# Patient Record
Sex: Female | Born: 1940 | Race: White | Hispanic: No | Smoking: Former smoker
Health system: Southern US, Community
[De-identification: ages and names within clinical notes are randomized; demographics above are authoritative.]

## PROBLEM LIST (undated history)

## (undated) DIAGNOSIS — C4491 Basal cell carcinoma of skin, unspecified: Secondary | ICD-10-CM

## (undated) DIAGNOSIS — M1990 Unspecified osteoarthritis, unspecified site: Secondary | ICD-10-CM

## (undated) DIAGNOSIS — K5090 Crohn's disease, unspecified, without complications: Secondary | ICD-10-CM

## (undated) DIAGNOSIS — D649 Anemia, unspecified: Secondary | ICD-10-CM

## (undated) HISTORY — PX: BREAST EXCISIONAL BIOPSY: SUR124

## (undated) HISTORY — DX: Crohn's disease, unspecified, without complications: K50.90

## (undated) HISTORY — DX: Basal cell carcinoma of skin, unspecified: C44.91

## (undated) HISTORY — DX: Anemia, unspecified: D64.9

## (undated) HISTORY — DX: Unspecified osteoarthritis, unspecified site: M19.90

---

## 1982-06-06 HISTORY — PX: TOTAL COLECTOMY: SHX852

## 1992-06-06 HISTORY — PX: OTHER SURGICAL HISTORY: SHX169

## 1999-11-26 ENCOUNTER — Other Ambulatory Visit: Admission: RE | Admit: 1999-11-26 | Discharge: 1999-11-26 | Payer: Self-pay | Admitting: Internal Medicine

## 1999-12-02 ENCOUNTER — Encounter: Payer: Self-pay | Admitting: Internal Medicine

## 1999-12-02 ENCOUNTER — Encounter: Admission: RE | Admit: 1999-12-02 | Discharge: 1999-12-02 | Payer: Self-pay | Admitting: Internal Medicine

## 2000-12-15 ENCOUNTER — Encounter: Payer: Self-pay | Admitting: Internal Medicine

## 2000-12-15 ENCOUNTER — Encounter: Admission: RE | Admit: 2000-12-15 | Discharge: 2000-12-15 | Payer: Self-pay | Admitting: Internal Medicine

## 2002-01-04 ENCOUNTER — Encounter: Admission: RE | Admit: 2002-01-04 | Discharge: 2002-01-04 | Payer: Self-pay | Admitting: Internal Medicine

## 2002-01-04 ENCOUNTER — Encounter: Payer: Self-pay | Admitting: Internal Medicine

## 2003-01-30 ENCOUNTER — Encounter: Payer: Self-pay | Admitting: Emergency Medicine

## 2003-01-30 ENCOUNTER — Encounter: Admission: RE | Admit: 2003-01-30 | Discharge: 2003-01-30 | Payer: Self-pay | Admitting: Emergency Medicine

## 2004-01-16 ENCOUNTER — Other Ambulatory Visit: Admission: RE | Admit: 2004-01-16 | Discharge: 2004-01-16 | Payer: Self-pay | Admitting: Family Medicine

## 2004-02-13 ENCOUNTER — Encounter: Admission: RE | Admit: 2004-02-13 | Discharge: 2004-02-13 | Payer: Self-pay | Admitting: Family Medicine

## 2004-08-02 ENCOUNTER — Encounter: Admission: RE | Admit: 2004-08-02 | Discharge: 2004-08-02 | Payer: Self-pay | Admitting: Plastic Surgery

## 2005-03-09 ENCOUNTER — Other Ambulatory Visit: Admission: RE | Admit: 2005-03-09 | Discharge: 2005-03-09 | Payer: Self-pay | Admitting: Family Medicine

## 2005-04-01 ENCOUNTER — Encounter: Admission: RE | Admit: 2005-04-01 | Discharge: 2005-04-01 | Payer: Self-pay | Admitting: Family Medicine

## 2005-11-08 ENCOUNTER — Ambulatory Visit: Payer: Self-pay | Admitting: Family Medicine

## 2005-12-08 ENCOUNTER — Ambulatory Visit: Payer: Self-pay | Admitting: Family Medicine

## 2006-01-09 ENCOUNTER — Ambulatory Visit: Payer: Self-pay | Admitting: Family Medicine

## 2006-02-02 ENCOUNTER — Ambulatory Visit: Payer: Self-pay | Admitting: Family Medicine

## 2006-03-14 ENCOUNTER — Ambulatory Visit: Payer: Self-pay | Admitting: Family Medicine

## 2006-03-14 ENCOUNTER — Other Ambulatory Visit: Admission: RE | Admit: 2006-03-14 | Discharge: 2006-03-14 | Payer: Self-pay | Admitting: Family Medicine

## 2006-03-27 ENCOUNTER — Ambulatory Visit: Payer: Self-pay | Admitting: Internal Medicine

## 2006-03-28 ENCOUNTER — Ambulatory Visit: Payer: Self-pay | Admitting: Family Medicine

## 2006-04-04 ENCOUNTER — Encounter: Admission: RE | Admit: 2006-04-04 | Discharge: 2006-04-04 | Payer: Self-pay | Admitting: Family Medicine

## 2006-04-10 ENCOUNTER — Ambulatory Visit: Payer: Self-pay | Admitting: Family Medicine

## 2006-05-08 ENCOUNTER — Ambulatory Visit: Payer: Self-pay | Admitting: Family Medicine

## 2006-05-25 ENCOUNTER — Ambulatory Visit: Payer: Self-pay | Admitting: Internal Medicine

## 2006-06-01 LAB — IRON AND TIBC
%SAT: 22 % (ref 20–55)
Iron: 52 ug/dL (ref 42–145)
TIBC: 237 ug/dL — ABNORMAL LOW (ref 250–470)

## 2006-06-01 LAB — CBC WITH DIFFERENTIAL/PLATELET
BASO%: 0.7 % (ref 0.0–2.0)
MCHC: 32.1 g/dL (ref 32.0–36.0)
MONO#: 0.2 10*3/uL (ref 0.1–0.9)
RBC: 3.55 10*6/uL — ABNORMAL LOW (ref 3.70–5.32)
RDW: 24.9 % — ABNORMAL HIGH (ref 11.3–14.5)
WBC: 3.2 10*3/uL — ABNORMAL LOW (ref 3.9–10.0)
lymph#: 0.8 10*3/uL — ABNORMAL LOW (ref 0.9–3.3)

## 2006-06-12 ENCOUNTER — Ambulatory Visit: Payer: Self-pay | Admitting: Family Medicine

## 2006-07-11 ENCOUNTER — Ambulatory Visit: Payer: Self-pay | Admitting: Family Medicine

## 2006-08-07 ENCOUNTER — Ambulatory Visit: Payer: Self-pay | Admitting: Family Medicine

## 2006-08-24 ENCOUNTER — Ambulatory Visit: Payer: Self-pay | Admitting: Internal Medicine

## 2006-08-29 LAB — CBC WITH DIFFERENTIAL/PLATELET
Basophils Absolute: 0 10*3/uL (ref 0.0–0.1)
EOS%: 2.1 % (ref 0.0–7.0)
LYMPH%: 28.1 % (ref 14.0–48.0)
MCH: 31.1 pg (ref 26.0–34.0)
MCV: 91.5 fL (ref 81.0–101.0)
MONO%: 8.1 % (ref 0.0–13.0)
Platelets: 272 10*3/uL (ref 145–400)
RBC: 4.08 10*6/uL (ref 3.70–5.32)
RDW: 16.3 % — ABNORMAL HIGH (ref 11.3–14.5)

## 2006-08-29 LAB — IRON AND TIBC
%SAT: 31 % (ref 20–55)
Iron: 82 ug/dL (ref 42–145)
UIBC: 185 ug/dL

## 2006-08-29 LAB — FERRITIN: Ferritin: 79 ng/mL (ref 10–291)

## 2006-09-05 ENCOUNTER — Ambulatory Visit: Payer: Self-pay | Admitting: Family Medicine

## 2006-11-09 ENCOUNTER — Ambulatory Visit: Payer: Self-pay | Admitting: Family Medicine

## 2006-11-23 ENCOUNTER — Ambulatory Visit: Payer: Self-pay | Admitting: Internal Medicine

## 2006-11-27 LAB — IRON AND TIBC
TIBC: 243 ug/dL — ABNORMAL LOW (ref 250–470)
UIBC: 189 ug/dL

## 2006-11-27 LAB — CBC WITH DIFFERENTIAL/PLATELET
BASO%: 0.5 % (ref 0.0–2.0)
EOS%: 1.1 % (ref 0.0–7.0)
MCH: 30.6 pg (ref 26.0–34.0)
MCHC: 34 g/dL (ref 32.0–36.0)
RDW: 15 % — ABNORMAL HIGH (ref 11.3–14.5)
lymph#: 0.8 10*3/uL — ABNORMAL LOW (ref 0.9–3.3)

## 2006-12-12 ENCOUNTER — Ambulatory Visit: Payer: Self-pay | Admitting: Family Medicine

## 2007-01-18 ENCOUNTER — Ambulatory Visit: Payer: Self-pay | Admitting: Family Medicine

## 2007-01-28 ENCOUNTER — Ambulatory Visit: Payer: Self-pay | Admitting: Internal Medicine

## 2007-01-29 LAB — CBC WITH DIFFERENTIAL/PLATELET
BASO%: 0.3 % (ref 0.0–2.0)
Eosinophils Absolute: 0 10*3/uL (ref 0.0–0.5)
LYMPH%: 28.3 % (ref 14.0–48.0)
MCHC: 35.1 g/dL (ref 32.0–36.0)
MCV: 88.7 fL (ref 81.0–101.0)
MONO%: 15.7 % — ABNORMAL HIGH (ref 0.0–13.0)
Platelets: 267 10*3/uL (ref 145–400)
RBC: 3.39 10*6/uL — ABNORMAL LOW (ref 3.70–5.32)

## 2007-01-29 LAB — IRON AND TIBC: UIBC: 185 ug/dL

## 2007-01-29 LAB — FERRITIN: Ferritin: 54 ng/mL (ref 10–291)

## 2007-02-16 ENCOUNTER — Ambulatory Visit: Payer: Self-pay | Admitting: Family Medicine

## 2007-03-14 ENCOUNTER — Ambulatory Visit: Payer: Self-pay | Admitting: Family Medicine

## 2007-04-05 ENCOUNTER — Ambulatory Visit: Payer: Self-pay | Admitting: Family Medicine

## 2007-04-05 ENCOUNTER — Other Ambulatory Visit: Admission: RE | Admit: 2007-04-05 | Discharge: 2007-04-05 | Payer: Self-pay | Admitting: Family Medicine

## 2007-04-16 ENCOUNTER — Encounter: Admission: RE | Admit: 2007-04-16 | Discharge: 2007-04-16 | Payer: Self-pay | Admitting: Family Medicine

## 2007-04-18 ENCOUNTER — Ambulatory Visit: Payer: Self-pay | Admitting: Family Medicine

## 2007-04-26 ENCOUNTER — Ambulatory Visit: Payer: Self-pay | Admitting: Internal Medicine

## 2007-04-30 LAB — CBC WITH DIFFERENTIAL/PLATELET
BASO%: 0 % (ref 0.0–2.0)
Eosinophils Absolute: 0 10*3/uL (ref 0.0–0.5)
HCT: 34.4 % — ABNORMAL LOW (ref 34.8–46.6)
MCHC: 34.3 g/dL (ref 32.0–36.0)
MONO#: 0.4 10*3/uL (ref 0.1–0.9)
NEUT#: 1.6 10*3/uL (ref 1.5–6.5)
NEUT%: 64.3 % (ref 39.6–76.8)
WBC: 2.5 10*3/uL — ABNORMAL LOW (ref 3.9–10.0)
lymph#: 0.5 10*3/uL — ABNORMAL LOW (ref 0.9–3.3)

## 2007-05-01 LAB — IRON AND TIBC
%SAT: 21 % (ref 20–55)
Iron: 48 ug/dL (ref 42–145)
TIBC: 228 ug/dL — ABNORMAL LOW (ref 250–470)

## 2007-05-01 LAB — VITAMIN B12: Vitamin B-12: 980 pg/mL — ABNORMAL HIGH (ref 211–911)

## 2007-05-01 LAB — FOLATE RBC: RBC Folate: 1917 ng/mL — ABNORMAL HIGH (ref 180–600)

## 2007-05-01 LAB — FERRITIN: Ferritin: 109 ng/mL (ref 10–291)

## 2007-05-09 ENCOUNTER — Ambulatory Visit: Payer: Self-pay | Admitting: Family Medicine

## 2007-06-07 HISTORY — PX: OTHER SURGICAL HISTORY: SHX169

## 2007-06-18 ENCOUNTER — Ambulatory Visit: Payer: Self-pay | Admitting: Family Medicine

## 2007-07-10 ENCOUNTER — Emergency Department (HOSPITAL_COMMUNITY): Admission: EM | Admit: 2007-07-10 | Discharge: 2007-07-10 | Payer: Self-pay | Admitting: Emergency Medicine

## 2007-07-13 ENCOUNTER — Ambulatory Visit (HOSPITAL_COMMUNITY): Admission: RE | Admit: 2007-07-13 | Discharge: 2007-07-13 | Payer: Self-pay | Admitting: Gastroenterology

## 2007-07-13 ENCOUNTER — Encounter (INDEPENDENT_AMBULATORY_CARE_PROVIDER_SITE_OTHER): Payer: Self-pay | Admitting: Gastroenterology

## 2007-07-13 LAB — HM COLONOSCOPY: HM Colonoscopy: NEGATIVE

## 2007-08-02 ENCOUNTER — Ambulatory Visit: Payer: Self-pay | Admitting: Internal Medicine

## 2007-08-06 LAB — CBC WITH DIFFERENTIAL/PLATELET
Basophils Absolute: 0 10*3/uL (ref 0.0–0.1)
Eosinophils Absolute: 0 10*3/uL (ref 0.0–0.5)
HCT: 24.3 % — ABNORMAL LOW (ref 34.8–46.6)
HGB: 7.8 g/dL — ABNORMAL LOW (ref 11.6–15.9)
LYMPH%: 9.6 % — ABNORMAL LOW (ref 14.0–48.0)
MCV: 77.6 fL — ABNORMAL LOW (ref 81.0–101.0)
MONO%: 8.1 % (ref 0.0–13.0)
NEUT#: 3.9 10*3/uL (ref 1.5–6.5)
NEUT%: 82 % — ABNORMAL HIGH (ref 39.6–76.8)
Platelets: 449 10*3/uL — ABNORMAL HIGH (ref 145–400)

## 2007-08-06 LAB — FERRITIN: Ferritin: 59 ng/mL (ref 10–291)

## 2007-09-03 ENCOUNTER — Ambulatory Visit: Payer: Self-pay | Admitting: Family Medicine

## 2007-09-18 ENCOUNTER — Encounter: Admission: RE | Admit: 2007-09-18 | Discharge: 2007-09-18 | Payer: Self-pay | Admitting: Gastroenterology

## 2007-09-20 ENCOUNTER — Inpatient Hospital Stay (HOSPITAL_COMMUNITY): Admission: AD | Admit: 2007-09-20 | Discharge: 2007-09-22 | Payer: Self-pay | Admitting: Gastroenterology

## 2007-10-05 ENCOUNTER — Ambulatory Visit: Payer: Self-pay | Admitting: Internal Medicine

## 2007-10-10 ENCOUNTER — Ambulatory Visit: Payer: Self-pay | Admitting: Family Medicine

## 2007-10-10 LAB — CBC WITH DIFFERENTIAL/PLATELET
Basophils Absolute: 0 10*3/uL (ref 0.0–0.1)
Eosinophils Absolute: 0 10*3/uL (ref 0.0–0.5)
HCT: 34.7 % — ABNORMAL LOW (ref 34.8–46.6)
LYMPH%: 28.5 % (ref 14.0–48.0)
MCV: 86.5 fL (ref 81.0–101.0)
MONO#: 0.6 10*3/uL (ref 0.1–0.9)
NEUT#: 1.8 10*3/uL (ref 1.5–6.5)
NEUT%: 51 % (ref 39.6–76.8)
Platelets: 244 10*3/uL (ref 145–400)
WBC: 3.4 10*3/uL — ABNORMAL LOW (ref 3.9–10.0)

## 2007-10-10 LAB — IRON AND TIBC
%SAT: 14 % — ABNORMAL LOW (ref 20–55)
TIBC: 230 ug/dL — ABNORMAL LOW (ref 250–470)

## 2007-10-10 LAB — TECHNOLOGIST REVIEW

## 2007-11-01 LAB — CBC WITH DIFFERENTIAL/PLATELET
Basophils Absolute: 0 10*3/uL (ref 0.0–0.1)
EOS%: 0.4 % (ref 0.0–7.0)
HCT: 29.1 % — ABNORMAL LOW (ref 34.8–46.6)
HGB: 9.7 g/dL — ABNORMAL LOW (ref 11.6–15.9)
MCH: 27.6 pg (ref 26.0–34.0)
MONO#: 0.5 10*3/uL (ref 0.1–0.9)
NEUT%: 79.4 % — ABNORMAL HIGH (ref 39.6–76.8)
lymph#: 0.6 10*3/uL — ABNORMAL LOW (ref 0.9–3.3)

## 2007-11-01 LAB — HOLD TUBE, BLOOD BANK

## 2007-11-06 ENCOUNTER — Inpatient Hospital Stay (HOSPITAL_COMMUNITY): Admission: EM | Admit: 2007-11-06 | Discharge: 2007-11-23 | Payer: Self-pay | Admitting: Emergency Medicine

## 2007-11-14 ENCOUNTER — Ambulatory Visit: Payer: Self-pay | Admitting: Gastroenterology

## 2007-11-20 ENCOUNTER — Ambulatory Visit: Payer: Self-pay | Admitting: Infectious Diseases

## 2007-11-30 ENCOUNTER — Ambulatory Visit: Payer: Self-pay | Admitting: Family Medicine

## 2007-12-11 ENCOUNTER — Ambulatory Visit: Payer: Self-pay | Admitting: Internal Medicine

## 2007-12-14 LAB — COMPREHENSIVE METABOLIC PANEL
Albumin: 3 g/dL — ABNORMAL LOW (ref 3.5–5.2)
Alkaline Phosphatase: 48 U/L (ref 39–117)
BUN: 37 mg/dL — ABNORMAL HIGH (ref 6–23)
CO2: 26 mEq/L (ref 19–32)
Calcium: 8.9 mg/dL (ref 8.4–10.5)
Chloride: 107 mEq/L (ref 96–112)
Glucose, Bld: 91 mg/dL (ref 70–99)
Potassium: 4 mEq/L (ref 3.5–5.3)

## 2007-12-14 LAB — CBC WITH DIFFERENTIAL/PLATELET
Basophils Absolute: 0 10*3/uL (ref 0.0–0.1)
Eosinophils Absolute: 0.2 10*3/uL (ref 0.0–0.5)
HGB: 9.5 g/dL — ABNORMAL LOW (ref 11.6–15.9)
MCV: 89.6 fL (ref 81.0–101.0)
MONO#: 0.6 10*3/uL (ref 0.1–0.9)
MONO%: 16.6 % — ABNORMAL HIGH (ref 0.0–13.0)
NEUT#: 2 10*3/uL (ref 1.5–6.5)
RDW: 18.1 % — ABNORMAL HIGH (ref 11.3–14.5)

## 2007-12-14 LAB — IRON AND TIBC
Iron: 87 ug/dL (ref 42–145)
TIBC: 216 ug/dL — ABNORMAL LOW (ref 250–470)
UIBC: 129 ug/dL

## 2008-01-08 ENCOUNTER — Ambulatory Visit: Payer: Self-pay | Admitting: Family Medicine

## 2008-02-04 ENCOUNTER — Ambulatory Visit: Payer: Self-pay | Admitting: Family Medicine

## 2008-02-07 ENCOUNTER — Ambulatory Visit: Payer: Self-pay | Admitting: Internal Medicine

## 2008-02-13 LAB — CBC WITH DIFFERENTIAL/PLATELET
BASO%: 0.4 % (ref 0.0–2.0)
EOS%: 1.3 % (ref 0.0–7.0)
HCT: 28.1 % — ABNORMAL LOW (ref 34.8–46.6)
HGB: 9.5 g/dL — ABNORMAL LOW (ref 11.6–15.9)
MCHC: 33.9 g/dL (ref 32.0–36.0)
MONO#: 0.5 10*3/uL (ref 0.1–0.9)
NEUT%: 61.6 % (ref 39.6–76.8)
RDW: 16.4 % — ABNORMAL HIGH (ref 11.3–14.5)
WBC: 3.1 10*3/uL — ABNORMAL LOW (ref 3.9–10.0)
lymph#: 0.7 10*3/uL — ABNORMAL LOW (ref 0.9–3.3)

## 2008-02-13 LAB — FERRITIN: Ferritin: 130 ng/mL (ref 10–291)

## 2008-02-13 LAB — IRON AND TIBC: Iron: 48 ug/dL (ref 42–145)

## 2008-02-21 ENCOUNTER — Ambulatory Visit: Payer: Self-pay | Admitting: Family Medicine

## 2008-03-11 ENCOUNTER — Ambulatory Visit: Payer: Self-pay | Admitting: Family Medicine

## 2008-04-07 ENCOUNTER — Other Ambulatory Visit: Admission: RE | Admit: 2008-04-07 | Discharge: 2008-04-07 | Payer: Self-pay | Admitting: Family Medicine

## 2008-04-07 ENCOUNTER — Ambulatory Visit: Payer: Self-pay | Admitting: Family Medicine

## 2008-04-10 ENCOUNTER — Ambulatory Visit: Payer: Self-pay | Admitting: Internal Medicine

## 2008-04-14 LAB — CBC WITH DIFFERENTIAL/PLATELET
Basophils Absolute: 0 10*3/uL (ref 0.0–0.1)
Eosinophils Absolute: 0.1 10*3/uL (ref 0.0–0.5)
HGB: 9.5 g/dL — ABNORMAL LOW (ref 11.6–15.9)
MONO#: 0.5 10*3/uL (ref 0.1–0.9)
NEUT#: 2.1 10*3/uL (ref 1.5–6.5)
Platelets: 268 10*3/uL (ref 145–400)
RBC: 3.55 10*6/uL — ABNORMAL LOW (ref 3.70–5.32)
RDW: 16.4 % — ABNORMAL HIGH (ref 11.3–14.5)
WBC: 3.4 10*3/uL — ABNORMAL LOW (ref 3.9–10.0)

## 2008-04-15 LAB — FOLATE RBC: RBC Folate: 2187 ng/mL — ABNORMAL HIGH (ref 180–600)

## 2008-04-15 LAB — VITAMIN B12: Vitamin B-12: 693 pg/mL (ref 211–911)

## 2008-04-15 LAB — LACTATE DEHYDROGENASE: LDH: 160 U/L (ref 94–250)

## 2008-04-15 LAB — IRON AND TIBC: %SAT: 14 % — ABNORMAL LOW (ref 20–55)

## 2008-04-21 ENCOUNTER — Encounter: Admission: RE | Admit: 2008-04-21 | Discharge: 2008-04-21 | Payer: Self-pay | Admitting: Family Medicine

## 2008-07-10 ENCOUNTER — Ambulatory Visit: Payer: Self-pay | Admitting: Internal Medicine

## 2008-07-14 LAB — CBC WITH DIFFERENTIAL/PLATELET
EOS%: 0.8 % (ref 0.0–7.0)
Eosinophils Absolute: 0 10*3/uL (ref 0.0–0.5)
LYMPH%: 12.6 % — ABNORMAL LOW (ref 14.0–48.0)
MCH: 26.8 pg (ref 26.0–34.0)
MCV: 81.4 fL (ref 81.0–101.0)
MONO%: 9.3 % (ref 0.0–13.0)
NEUT#: 3.9 10*3/uL (ref 1.5–6.5)
Platelets: 245 10*3/uL (ref 145–400)
RBC: 3.4 10*6/uL — ABNORMAL LOW (ref 3.70–5.32)
RDW: 18.2 % — ABNORMAL HIGH (ref 11.3–14.5)

## 2008-07-15 LAB — IRON AND TIBC: Iron: 36 ug/dL — ABNORMAL LOW (ref 42–145)

## 2008-07-15 LAB — FERRITIN: Ferritin: 33 ng/mL (ref 10–291)

## 2008-07-15 LAB — FOLATE RBC: RBC Folate: 1691 ng/mL — ABNORMAL HIGH (ref 180–600)

## 2008-08-26 ENCOUNTER — Ambulatory Visit: Payer: Self-pay | Admitting: Family Medicine

## 2008-09-11 ENCOUNTER — Ambulatory Visit: Payer: Self-pay | Admitting: Internal Medicine

## 2008-09-15 LAB — LACTATE DEHYDROGENASE: LDH: 151 U/L (ref 94–250)

## 2008-09-15 LAB — IRON AND TIBC
%SAT: 12 % — ABNORMAL LOW (ref 20–55)
Iron: 36 ug/dL — ABNORMAL LOW (ref 42–145)
TIBC: 300 ug/dL (ref 250–470)
UIBC: 264 ug/dL

## 2008-09-15 LAB — CBC WITH DIFFERENTIAL/PLATELET
Basophils Absolute: 0 10*3/uL (ref 0.0–0.1)
Eosinophils Absolute: 0.1 10*3/uL (ref 0.0–0.5)
HGB: 9.5 g/dL — ABNORMAL LOW (ref 11.6–15.9)
LYMPH%: 25.3 % (ref 14.0–49.7)
MCV: 82.3 fL (ref 79.5–101.0)
MONO%: 15 % — ABNORMAL HIGH (ref 0.0–14.0)
NEUT#: 1.9 10*3/uL (ref 1.5–6.5)
NEUT%: 55.8 % (ref 38.4–76.8)
Platelets: 231 10*3/uL (ref 145–400)

## 2008-09-15 LAB — VITAMIN B12: Vitamin B-12: 414 pg/mL (ref 211–911)

## 2008-09-15 LAB — FERRITIN: Ferritin: 19 ng/mL (ref 10–291)

## 2008-09-16 ENCOUNTER — Ambulatory Visit: Payer: Self-pay | Admitting: Family Medicine

## 2008-10-17 ENCOUNTER — Ambulatory Visit: Payer: Self-pay | Admitting: Family Medicine

## 2008-11-14 ENCOUNTER — Ambulatory Visit: Payer: Self-pay | Admitting: Family Medicine

## 2008-12-16 ENCOUNTER — Ambulatory Visit: Payer: Self-pay | Admitting: Internal Medicine

## 2009-01-05 LAB — CBC WITH DIFFERENTIAL/PLATELET
EOS%: 2 % (ref 0.0–7.0)
Eosinophils Absolute: 0.1 10*3/uL (ref 0.0–0.5)
MCV: 85.4 fL (ref 79.5–101.0)
MONO%: 11 % (ref 0.0–14.0)
NEUT#: 2.9 10*3/uL (ref 1.5–6.5)
RBC: 3.26 10*6/uL — ABNORMAL LOW (ref 3.70–5.45)
RDW: 15 % — ABNORMAL HIGH (ref 11.2–14.5)

## 2009-01-05 LAB — IRON AND TIBC
Iron: 29 ug/dL — ABNORMAL LOW (ref 42–145)
UIBC: 256 ug/dL

## 2009-01-05 LAB — FERRITIN: Ferritin: 14 ng/mL (ref 10–291)

## 2009-01-06 ENCOUNTER — Ambulatory Visit: Payer: Self-pay | Admitting: Family Medicine

## 2009-01-23 ENCOUNTER — Ambulatory Visit: Payer: Self-pay | Admitting: Family Medicine

## 2009-01-27 ENCOUNTER — Ambulatory Visit: Payer: Self-pay | Admitting: Internal Medicine

## 2009-03-03 ENCOUNTER — Ambulatory Visit: Payer: Self-pay | Admitting: Family Medicine

## 2009-03-31 ENCOUNTER — Ambulatory Visit: Payer: Self-pay | Admitting: Family Medicine

## 2009-04-23 ENCOUNTER — Encounter: Admission: RE | Admit: 2009-04-23 | Discharge: 2009-04-23 | Payer: Self-pay | Admitting: Family Medicine

## 2009-05-05 ENCOUNTER — Encounter: Admission: RE | Admit: 2009-05-05 | Discharge: 2009-05-05 | Payer: Self-pay | Admitting: Family Medicine

## 2009-05-12 ENCOUNTER — Ambulatory Visit: Payer: Self-pay | Admitting: Internal Medicine

## 2009-05-14 ENCOUNTER — Ambulatory Visit: Payer: Self-pay | Admitting: Family Medicine

## 2009-06-03 ENCOUNTER — Other Ambulatory Visit: Admission: RE | Admit: 2009-06-03 | Discharge: 2009-06-03 | Payer: Self-pay | Admitting: Family Medicine

## 2009-06-03 ENCOUNTER — Ambulatory Visit: Payer: Self-pay | Admitting: Family Medicine

## 2009-07-28 ENCOUNTER — Ambulatory Visit: Payer: Self-pay | Admitting: Internal Medicine

## 2009-07-30 ENCOUNTER — Ambulatory Visit: Payer: Self-pay | Admitting: Family Medicine

## 2009-07-30 LAB — CBC WITH DIFFERENTIAL/PLATELET
BASO%: 0.1 % (ref 0.0–2.0)
Basophils Absolute: 0 10e3/uL (ref 0.0–0.1)
EOS%: 2.7 % (ref 0.0–7.0)
Eosinophils Absolute: 0.1 10e3/uL (ref 0.0–0.5)
HCT: 25.9 % — ABNORMAL LOW (ref 34.8–46.6)
HGB: 8.2 g/dL — ABNORMAL LOW (ref 11.6–15.9)
LYMPH%: 20.6 % (ref 14.0–49.7)
MCH: 24.9 pg — ABNORMAL LOW (ref 25.1–34.0)
MCHC: 31.7 g/dL (ref 31.5–36.0)
MCV: 78.4 fL — ABNORMAL LOW (ref 79.5–101.0)
MONO#: 0.5 10e3/uL (ref 0.1–0.9)
MONO%: 9.1 % (ref 0.0–14.0)
NEUT#: 3.4 10e3/uL (ref 1.5–6.5)
NEUT%: 67.5 % (ref 38.4–76.8)
Platelets: 281 10e3/uL (ref 145–400)
RBC: 3.31 10e6/uL — ABNORMAL LOW (ref 3.70–5.45)
RDW: 18.1 % — ABNORMAL HIGH (ref 11.2–14.5)
WBC: 5 10e3/uL (ref 3.9–10.3)
lymph#: 1 10e3/uL (ref 0.9–3.3)

## 2009-07-30 LAB — IRON AND TIBC
%SAT: 8 % — ABNORMAL LOW (ref 20–55)
Iron: 26 ug/dL — ABNORMAL LOW (ref 42–145)
TIBC: 323 ug/dL (ref 250–470)
UIBC: 297 ug/dL

## 2009-07-30 LAB — FERRITIN: Ferritin: 6 ng/mL — ABNORMAL LOW (ref 10–291)

## 2009-10-06 ENCOUNTER — Encounter: Admission: RE | Admit: 2009-10-06 | Discharge: 2009-10-06 | Payer: Self-pay | Admitting: Family Medicine

## 2009-10-22 ENCOUNTER — Ambulatory Visit: Payer: Self-pay | Admitting: Internal Medicine

## 2009-10-23 LAB — CBC WITH DIFFERENTIAL/PLATELET
BASO%: 0.5 % (ref 0.0–2.0)
EOS%: 2.3 % (ref 0.0–7.0)
HGB: 10.5 g/dL — ABNORMAL LOW (ref 11.6–15.9)
MCH: 29.9 pg (ref 25.1–34.0)
MCHC: 34.1 g/dL (ref 31.5–36.0)
MONO#: 0.5 10*3/uL (ref 0.1–0.9)
RDW: 17.1 % — ABNORMAL HIGH (ref 11.2–14.5)
WBC: 4.1 10*3/uL (ref 3.9–10.3)
lymph#: 0.9 10*3/uL (ref 0.9–3.3)

## 2009-10-24 LAB — IRON AND TIBC
TIBC: 268 ug/dL (ref 250–470)
UIBC: 218 ug/dL

## 2009-10-29 ENCOUNTER — Ambulatory Visit: Payer: Self-pay | Admitting: Physician Assistant

## 2009-12-25 ENCOUNTER — Ambulatory Visit: Payer: Self-pay | Admitting: Physician Assistant

## 2010-01-20 ENCOUNTER — Ambulatory Visit: Payer: Self-pay | Admitting: Family Medicine

## 2010-01-20 ENCOUNTER — Ambulatory Visit: Payer: Self-pay | Admitting: Internal Medicine

## 2010-01-20 LAB — CBC WITH DIFFERENTIAL/PLATELET
Basophils Absolute: 0 10*3/uL (ref 0.0–0.1)
Eosinophils Absolute: 0.1 10*3/uL (ref 0.0–0.5)
HCT: 31.2 % — ABNORMAL LOW (ref 34.8–46.6)
HGB: 10.6 g/dL — ABNORMAL LOW (ref 11.6–15.9)
MONO#: 0.4 10*3/uL (ref 0.1–0.9)
NEUT#: 2.9 10*3/uL (ref 1.5–6.5)
RDW: 16.5 % — ABNORMAL HIGH (ref 11.2–14.5)
lymph#: 0.7 10*3/uL — ABNORMAL LOW (ref 0.9–3.3)

## 2010-01-20 LAB — FOLATE: Folate: >20 ng/mL

## 2010-01-20 LAB — IRON AND TIBC
%SAT: 12 % — ABNORMAL LOW (ref 20–55)
TIBC: 281 ug/dL (ref 250–470)
UIBC: 247 ug/dL

## 2010-01-20 LAB — VITAMIN B12: Vitamin B-12: 505 pg/mL (ref 211–911)

## 2010-03-10 ENCOUNTER — Ambulatory Visit: Payer: Self-pay | Admitting: Family Medicine

## 2010-04-16 ENCOUNTER — Ambulatory Visit: Payer: Self-pay | Admitting: Internal Medicine

## 2010-04-16 LAB — CBC WITH DIFFERENTIAL/PLATELET
Basophils Absolute: 0 10*3/uL (ref 0.0–0.1)
EOS%: 1.9 % (ref 0.0–7.0)
Eosinophils Absolute: 0.1 10*3/uL (ref 0.0–0.5)
HGB: 11.3 g/dL — ABNORMAL LOW (ref 11.6–15.9)
LYMPH%: 17 % (ref 14.0–49.7)
MCH: 30.1 pg (ref 25.1–34.0)
MCV: 88.6 fL (ref 79.5–101.0)
MONO%: 12.3 % (ref 0.0–14.0)
NEUT#: 3.2 10*3/uL (ref 1.5–6.5)
Platelets: 255 10*3/uL (ref 145–400)

## 2010-04-16 LAB — FERRITIN: Ferritin: 229 ng/mL (ref 10–291)

## 2010-05-10 ENCOUNTER — Encounter: Admission: RE | Admit: 2010-05-10 | Discharge: 2010-05-10 | Payer: Self-pay | Admitting: Family Medicine

## 2010-05-10 ENCOUNTER — Ambulatory Visit: Payer: Self-pay | Admitting: Family Medicine

## 2010-05-10 LAB — HM MAMMOGRAPHY: HM Mammogram: NEGATIVE

## 2010-06-23 ENCOUNTER — Ambulatory Visit
Admission: RE | Admit: 2010-06-23 | Discharge: 2010-06-23 | Payer: Self-pay | Source: Home / Self Care | Attending: Family Medicine | Admitting: Family Medicine

## 2010-07-20 ENCOUNTER — Encounter: Payer: Self-pay | Admitting: Physician Assistant

## 2010-07-22 ENCOUNTER — Ambulatory Visit (INDEPENDENT_AMBULATORY_CARE_PROVIDER_SITE_OTHER): Payer: Medicare Other

## 2010-07-22 DIAGNOSIS — E538 Deficiency of other specified B group vitamins: Secondary | ICD-10-CM

## 2010-07-27 ENCOUNTER — Other Ambulatory Visit: Payer: Self-pay | Admitting: Internal Medicine

## 2010-07-27 ENCOUNTER — Encounter (HOSPITAL_BASED_OUTPATIENT_CLINIC_OR_DEPARTMENT_OTHER): Payer: BC Managed Care – PPO | Admitting: Internal Medicine

## 2010-07-27 DIAGNOSIS — D509 Iron deficiency anemia, unspecified: Secondary | ICD-10-CM

## 2010-07-27 DIAGNOSIS — K909 Intestinal malabsorption, unspecified: Secondary | ICD-10-CM

## 2010-07-27 LAB — IRON AND TIBC
%SAT: 17 % — ABNORMAL LOW (ref 20–55)
TIBC: 242 ug/dL — ABNORMAL LOW (ref 250–470)
UIBC: 202 ug/dL

## 2010-07-27 LAB — CBC WITH DIFFERENTIAL/PLATELET
BASO%: 0.2 % (ref 0.0–2.0)
Basophils Absolute: 0 10*3/uL (ref 0.0–0.1)
EOS%: 1.4 % (ref 0.0–7.0)
HGB: 10.7 g/dL — ABNORMAL LOW (ref 11.6–15.9)
MCH: 28.6 pg (ref 25.1–34.0)
MCHC: 32.9 g/dL (ref 31.5–36.0)
MONO#: 0.3 10*3/uL (ref 0.1–0.9)
RDW: 15.5 % — ABNORMAL HIGH (ref 11.2–14.5)
WBC: 4.6 10*3/uL (ref 3.9–10.3)
lymph#: 0.8 10*3/uL — ABNORMAL LOW (ref 0.9–3.3)

## 2010-08-03 ENCOUNTER — Encounter (HOSPITAL_BASED_OUTPATIENT_CLINIC_OR_DEPARTMENT_OTHER): Payer: BC Managed Care – PPO | Admitting: Internal Medicine

## 2010-08-03 DIAGNOSIS — K909 Intestinal malabsorption, unspecified: Secondary | ICD-10-CM

## 2010-08-03 DIAGNOSIS — D509 Iron deficiency anemia, unspecified: Secondary | ICD-10-CM

## 2010-08-04 ENCOUNTER — Encounter (HOSPITAL_BASED_OUTPATIENT_CLINIC_OR_DEPARTMENT_OTHER): Payer: BC Managed Care – PPO | Admitting: Internal Medicine

## 2010-08-04 DIAGNOSIS — K909 Intestinal malabsorption, unspecified: Secondary | ICD-10-CM

## 2010-08-04 DIAGNOSIS — D509 Iron deficiency anemia, unspecified: Secondary | ICD-10-CM

## 2010-08-05 ENCOUNTER — Encounter (INDEPENDENT_AMBULATORY_CARE_PROVIDER_SITE_OTHER): Payer: Medicare Other | Admitting: Family Medicine

## 2010-08-05 ENCOUNTER — Other Ambulatory Visit (HOSPITAL_COMMUNITY)
Admission: RE | Admit: 2010-08-05 | Discharge: 2010-08-05 | Disposition: A | Discharge status: Home / Self Care | Payer: Medicare Other | Source: Ambulatory Visit | Attending: Family Medicine | Admitting: Family Medicine

## 2010-08-05 ENCOUNTER — Other Ambulatory Visit: Payer: Self-pay | Admitting: Family Medicine

## 2010-08-05 DIAGNOSIS — Z0000 Encounter for general adult medical examination without abnormal findings: Secondary | ICD-10-CM

## 2010-08-05 DIAGNOSIS — Z124 Encounter for screening for malignant neoplasm of cervix: Secondary | ICD-10-CM | POA: Insufficient documentation

## 2010-08-11 ENCOUNTER — Encounter (HOSPITAL_BASED_OUTPATIENT_CLINIC_OR_DEPARTMENT_OTHER): Payer: BC Managed Care – PPO | Admitting: Internal Medicine

## 2010-08-11 DIAGNOSIS — K909 Intestinal malabsorption, unspecified: Secondary | ICD-10-CM

## 2010-08-11 DIAGNOSIS — D509 Iron deficiency anemia, unspecified: Secondary | ICD-10-CM

## 2010-09-24 ENCOUNTER — Other Ambulatory Visit (INDEPENDENT_AMBULATORY_CARE_PROVIDER_SITE_OTHER): Payer: Medicare Other

## 2010-09-24 DIAGNOSIS — E538 Deficiency of other specified B group vitamins: Secondary | ICD-10-CM

## 2010-10-18 ENCOUNTER — Other Ambulatory Visit: Payer: Self-pay | Admitting: Internal Medicine

## 2010-10-18 ENCOUNTER — Encounter (HOSPITAL_BASED_OUTPATIENT_CLINIC_OR_DEPARTMENT_OTHER): Payer: BC Managed Care – PPO | Admitting: Internal Medicine

## 2010-10-18 DIAGNOSIS — K909 Intestinal malabsorption, unspecified: Secondary | ICD-10-CM

## 2010-10-18 DIAGNOSIS — D649 Anemia, unspecified: Secondary | ICD-10-CM

## 2010-10-18 LAB — CBC WITH DIFFERENTIAL/PLATELET
BASO%: 0.3 % (ref 0.0–2.0)
Basophils Absolute: 0 10*3/uL (ref 0.0–0.1)
Eosinophils Absolute: 0.1 10*3/uL (ref 0.0–0.5)
HCT: 30.6 % — ABNORMAL LOW (ref 34.8–46.6)
HGB: 10.3 g/dL — ABNORMAL LOW (ref 11.6–15.9)
MONO#: 0.4 10*3/uL (ref 0.1–0.9)
NEUT#: 3.1 10*3/uL (ref 1.5–6.5)
NEUT%: 72.4 % (ref 38.4–76.8)
WBC: 4.3 10*3/uL (ref 3.9–10.3)
lymph#: 0.7 10*3/uL — ABNORMAL LOW (ref 0.9–3.3)

## 2010-10-19 NOTE — Discharge Summary (Signed)
Kim [name], Kim [name]                ACCOUNT NO.:  [id number]   MEDICAL RECORD NO.:  [id number]          PATIENT TYPE:  INP   LOCATION:  6711                         FACILITY:  [facility]   PHYSICIAN:  Tory Emerald. Benson Norway, MD    DATE OF BIRTH:  February 27, 1941   DATE OF ADMISSION:  09/19/2007  DATE OF DISCHARGE:  09/22/2007                               DISCHARGE SUMMARY   ADMISSION DIAGNOSES:  1. Peristomal abscess.  2. Iron-deficiency anemia.  3. Pouchitis.   CONSULTATION:  Orson Ape. Rise Patience, MD   PRIMARY CARE PHYSICIAN:  Jackelyn Poling, MD   HISTORY AND PHYSICAL:  Please see the original H&P for full details.   HOSPITAL COURSE:  The patient was admitted to the hospital with findings  of severe iron-deficiency anemia with hemoglobin of 6.2.  She was  subsequently transfused 3 units of packed red blood cells, which she  tolerated without any difficulty.  She was evaluated in the office 2  days prior and was noted to have what appeared to be a large peristomal  abscess.  It was tender to palpation at that time, had been developing  for the past 2 weeks.  Upon the day of admission, the patient states  that they had spontaneously drained.  Previously, the CT scan was  performed on an outpatient basis and noted to confirm the suspicion of  an abscess. As the abscess has spontaneously drained, her abdominal pain  markedly improved.  Subsequently, a surgical consult was obtained and  she was seen by Dr. Rise Patience who felt that she may have possibly an  obstruction or fistula in that area as suggested on the initial CT scan.  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  Because of  the nature of her Koch's pouch, it was felt that she would be better  managed on a long-term basis by [facility].  Dr. Rise Patience  pursued followup appointments for her and she was to see Dr. Ronita Hipps  at  [facility] for further management of this issue.  Since the patient had done well since the drainage of her abscess and  she was maintaining her hemoglobin, she was able to be discharged safely  home.  Plans for the patient to follow up with [facility] for further management of her peristomal abscess, enterocutaneous  fistula, and her prior [facility] pouch.      Tory Emerald Benson Norway, MD  Electronically Signed     PDH/MEDQ  D:  10/16/2007  T:  10/17/2007  Job:  [id number]   cc:   Orson Ape. Rise Patience, M.D.  Jackelyn Poling, MD

## 2010-10-19 NOTE — Discharge Summary (Signed)
Kim, [name]                ACCOUNT NO.:  [id number]   MEDICAL RECORD NO.:  [id number]          PATIENT TYPE:  INP   LOCATION:  [place]                         FACILITY:  [facility]   PHYSICIAN:  Tory Emerald. Benson Norway, MD    DATE OF BIRTH:  April 26, 1941   DATE OF ADMISSION:  11/06/2007  DATE OF DISCHARGE:  11/23/2007                               DISCHARGE SUMMARY   DISCHARGE DIAGNOSES:  1. Crohn disease.  2. Malnutrition.  3. Peristomal fistula.  4. Fever of unknown etiology.  5. Resolved acute renal failure.   HISTORY AND PHYSICAL:  Please see the original H&P for full details.   HOSPITAL COURSE:  The patient was admitted in a markedly malnourished  and weakened state.  At that time her electrolytes were markedly  abnormal with a sodium of 124 upon admission and her calcium was noted  to be at 6.4 and phosphorus at 1.  At that time she she was aggressively  IV hydrated.  She was also noted to have mild renal insufficiency.  IV  hydration did improve her current status.  Because she was being seen by  Dr. Jeanne Ivan at [facility], I was not able to  receive all the records in regard to her current care and therefore for  the initial 1-2 days of her hospitalization I was awaiting these  records.  Fortunately, I was able to correspond with Dr. Eustace Pen and get a  clear understanding of the therapy that was provided for her.  Dr. Emelda Fear  felt that she had Crohn disease and subsequently was being treated  towards that end.  During the time of admission apparently the patient  had misjudged the amount of fistula output and subsequently it is felt  that she became markedly dehydrated and weak from the output as well as  her ostomy output.  The patient was also noted to be markedly anemic at  7.7 and she has a known history of iron-deficiency anemia.  Subsequently  the patient was transfused and started on TPN and placed on a strict  n.p.o. diet.  Over the course of her  hospitalization she did continue to  improve, albeit slowly.  Her albumin on admission, had the lowest value  of 1.2, and upon discharge the albumin is at 1.6.  Her prealbumin was  steadily increasing over the course of the hospitalization.  Physical  therapy consultation was ordered for her and this helped her in regard  to her strength training and ambulation, which she was able to advance  to a point that she can walk steadily with an IV pole.  She also has  good support from her husband, who can monitor her situation at home.  When she was initially admitted the patient was noted to have fevers  ranging from 101-103 and her platelet count was at 576 at that point and  it was the feeling that these elevations were secondary to her  inflammatory process from her Crohn disease.  Fortunately, these fevers  did subside and she became afebrile for approximately 1 week.  However,  upon the beginning of the second week she started to have spiking  fevers.  Blood cultures and urine cultures were obtained, which were  negative, and a chest x-ray was negative for any acute disease.  Because  of  the spiking fevers, an infectious disease consult was requested.  It  was felt that the fever was most likely secondary from her inflammatory  bowel disease.  The PICC line was not discontinued on her and apparently  the site is intact although this is also a potential source of her  persistent fevers.  She has been instructed upon discharge that if the  fevers do continue to become more consistent on a routine basis, the  PICC line will be removed and subsequently a new PICC line will have to  be placed in once of blood cultures are negative.  She also received her  Humira dose during the first week of her hospitalization and upon  discharge to home, she will receive her subsequent dosing of Humira as  previously recommended.  Her second induction dose was at 80 mg.  The  fistula output did decrease over  time.  Documentation of fistula output  as well as ostomy output was extremely difficult and quite confusing in  regard to interpretation from what was reported by the nursing staff.  Per the patient she had minimal output as the hospitalization  progressed.  In fact, there was no fistula output for several days  during the first week of her hospitalization while on n.p.o. status.  However, there was a breakthrough at one point where she did have an  increase of approximately 250 mL, however, this quickly subsided.  She  was able to self intubate her Kock's pouch.  Per the patient and her  husband, there was a significant amount of stool that was being  extracted, however, she did not feel that was more than her baseline  output.  Because of her severely malnourished state upon admission and  the fistula output, it was felt that she will be discharged home still  on n.p.o. status.  The patient will follow up with Dr. Benson Norway on November 28, 2007 to determine if she can reinitiate p.o. feedings.  There was a  concern that she would not be able to obtain enough nutrition from a  p.o. standpoint.  Dr. Benson Norway will try to contact Dr. Emelda Fear in regard to  their recommendations for nutritional supplementation.   PLAN:  The plan at this time is to discharge the patient home with home  TPN to provide her Home Health.  She will also resume her Xifaxan.  Finally, the patient will receive her next dose of Humira on this  following Sunday.  If there are any problems or questions in the  meantime, they know to call the office, and they are also instructed to  follow up with Dr. Emelda Fear in July.      Tory Emerald Benson Norway, MD  Electronically Signed     PDH/MEDQ  D:  11/23/2007  T:  11/23/2007  Job:  [id number]   cc:   Jill Alexanders, M.D.  Fax: [phone]   Jeanne Ivan, MD  [facility]  GI Dept.   Orson Ape. Rise Patience, M.D.  [address], [place]  [place] [address]

## 2010-10-19 NOTE — Consult Note (Signed)
NAMESINCERE, BERLANGA                ACCOUNT NO.:  [id number]   MEDICAL RECORD NO.:  [id number]          PATIENT TYPE:  OBV   LOCATION:  6711                         FACILITY:  [facility]   PHYSICIAN:  Ebony Hail L. Lissa Merlin, N.P. DATE OF BIRTH:  03-20-1941   DATE OF CONSULTATION:  09/19/2007  DATE OF DISCHARGE:                                 CONSULTATION   TIME OF CONSULTATION:  1405.   REQUESTING PHYSICIAN:  Tory Emerald. Benson Norway, MD   REASON FOR CONSULTATION:  Peristomal abscess.   HISTORY OF PRESENT ILLNESS:  [name] is a 70 year old female patient,  who has a known prior history of ulcerative colitis, for which she  underwent a total colectomy and ileostomy 25 plus years ago.  In the  1990, she underwent an ileal pouch procedure.  Had done well with that  until the past 10 years for which since the creation of the pouch, she  has had chronic problems related to pouchitis.  She had been on chronic  Flagyl for some time, but this had to be stopped because of peripheral  lower extremity neuropathy.  Over the past 2 weeks, the patient has  developed increasing swelling and redness in the midline portion of her  abdomen about 4-5 inches below the umbilicus directly medial to the  ileostomal pouch.  Over the past 48 hours, this area has tented up,  became fluctuant, and then eventually ruptured purulent fluid.  The  patient has been seen by Dr. Benson Norway in the office and was found to be  anemic with a hemoglobin of 6.  She has been admitted to the hospital  for transfusion of blood.  In addition, she underwent a CT scan of the  abdomen and pelvis because of this abscess collection, which was done on  September 18, 2007.  This revealed small bowel edema at the pouch extending  into the ostomy site.  There is an associated peristomal abscess medial  that correlates with the area on the skin.  Underneath the skin, the  fluid collection is 4.6 x 3.4 x 5.5 cm, and they are requesting a  fistulous connection to  the pouch.  This was done after the pouch had  already spontaneously drained.  Surgical consultation has been requested  to evaluate this abscess.   REVIEW OF SYSTEMS:  CONSTITUTIONAL:  The patient reports an 8-pound  weight loss over the past few weeks.  Her baseline weight is around 80  pounds, so this is a significant amount of weight for this patient to be  losing.  She has had no fevers, but she has been experiencing malaise.  SKIN:  Again, the abscess has been present for about 2 weeks, as  previously described, recently ruptured over the past 24 hours.  Otherwise, review of systems is negative.  GI:  She has not had any  change in bleeding or stool pattern other than increasing diarrhea.  From ileal pouch, she always obtained some sort of bloody drainage with  catheter introduction to drain the pouch.   FAMILY MEDICAL HISTORY:  Noncontributory.   SOCIAL HISTORY:  Married.  No alcohol.  No tobacco.   PAST MEDICAL HISTORY:  1. Ulcerative colitis, cured surgically after total colectomy.  2. 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  3. Chronic pouchitis.   PAST SURGICAL HISTORY:  1. Total colectomy when the patient was in her 55s with ileostomy by      Dr. Leafy Kindle.  2. Conversion of ileostomy to ileal pouch in the 1990s.   ALLERGIES:  FLAGYL, which causes peripheral neuropathy.   MEDICATIONS:  The patient has recently been on Cipro and Augmentin to  treat pouchitis, but has been unsuccessful.  She has also been on  Entocort.   PHYSICAL EXAMINATION:  CONSTITUTIONAL:  GENERAL:  Pale, cachectic  appearing female, complaining of lower abdominal pain and abscess area,  which is tender.  VITAL SIGNS:  Temperature 97.3, BP 100/51, pulse 81 and regular, and  respirations 18.  HEENT:  Sclerae noninjected.  Conjunctivae are pale.  Pupils are equal,  round, and reactive to light.  Ears, nose, mouth, and throat; ears are   without lesions and symmetrical in appearance.  Nose is midline without  drainage.  Mouth is pink and moist.  NECK:  Trachea midline.  Thyroid is nonpalpable.  No masses.  RESPIRATION:  Respiratory effort is normal, nonlabored.  Bilateral lung  sounds are clear to auscultation anteriorly.  CARDIOVASCULAR:  Heart  sounds are S1 without rubs, murmurs, or gallops.  Pulse is regular.  No  tachycardia.  No JVD.  Carotid, radial, and pedal pulses are 1+  bilaterally.  CHEST:  Deferred.  BREASTS:  Deferred.  ABDOMEN:  Soft.  Bowel sounds are present.  She is somewhat distended in  the lower abdomen over the area, where the abscess and the skin is  noted.  She is tender over the midline area, where she has an abscess  collection.  The right lower quadrant stoma is normal in appearance with  a very tiny florid stoma, no bleeding.  There is no guarding, no  rebound, and no obvious hepatosplenomegaly, masses, or bruits otherwise.  LYMPHATICS:  Deferred.  EXTREMITIES:  They are symmetrical in appearance without clubbing or  cyanosis.  SKIN:  Skin is pale.  No worrisome lesion.  She does have an abscess in  the lower abdomen, measuring about 4 x 5 cm in diameter.  It is  indurated.  No definite fluctuance.  There is an open area in the  central portion of this abscess, circular, measuring 1 x 1 cm with  palpation unable to push out serosanguineous old blood and thickened  clots of purulence.  This area is exquisitely tender.  Some erythema.  The patient reports this has markedly improved since yesterday.  NEUROLOGIC:  Cranial nerves II through XII are grossly intact.  Sensation is intact in the upper and lower extremities.  No detailed  exam of the lower extremities was done to differentiate specific  abnormalities related to her known peripheral neuropathy of the feet.  PSYCHIATRIC:  The patient is oriented to time, person, place, and  situation.  Her affect is somewhat flat.   LABORATORY DATA:   Hemoglobin outpatient is reported as 6.0.  Any other  lab work is pending as ordered per Dr. Benson Norway.   DIAGNOSTIC STUDIES:  CT of the abdomen and pelvis as noted.   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  2. 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  3. Symptomatic anemia requiring packed red blood cells.  4. Protein-calorie malnutrition and chronic weight loss.   PLAN:  1. Agree with packed red blood cells and other treatment as outlined      by Dr. Benson Norway.  2. Dr. Rise Patience will need to review the CT with the radiologist.  At      this time, we need to determine if this is just a simple cutaneous      sinus fistula coming from the pouch to the skin or something more      complex.  The wound is open and draining, but still has retained      what appears to be pocket of purulence underneath the skin.  She      may need a deeper I&D that would be more appropriately done in that      given her amount of pain, we have not introduced a Q-tip into the      wound yet, and we have not taken external cultures, given the fact      that we will probably come up with a polymicrobial culture that      would not be indicative of actual infection.  3. Uncertain if the patient would need to go on abscessogram through      the open area on the abscess to evaluate the exact etiology of the      tract.  Again, I will discuss this with Dr. Rise Patience.      Donaldson Lissa Merlin, N.P.     ALE/MEDQ  D:  09/19/2007  T:  09/20/2007  Job:  [id number]   cc:   Tory Emerald. Benson Norway, MD

## 2010-10-19 NOTE — H&P (Signed)
NAMEGIBSON, TELLERIA                ACCOUNT NO.:  [id number]   MEDICAL RECORD NO.:  [id number]          PATIENT TYPE:  OBV   LOCATION:  6711                         FACILITY:  [facility]   PHYSICIAN:  Tory Emerald. Benson Norway, MD    DATE OF BIRTH:  1940/09/28   DATE OF ADMISSION:  09/19/2007  DATE OF DISCHARGE:                              HISTORY & PHYSICAL   REASON FOR ADMISSION:  Peristomal abscess and severe iron deficiency  anemia.   PRIMARY CARE Myan Locatelli:  Jill Alexanders, MD.   HISTORY OF PRESENT ILLNESS:  This is a 70 year old female with a past  medical history of ulcerative colitis status post total proctocolectomy  in the 1980s with a subsequent ileostomy and then the creation of a  [place] [employer]) who is admitted to the  hospital with a peristomal abscess.  The patient has never had a problem  with her internal reservoir until this time.  Within the past several  months, the patient did start experiencing a worsening of her pouchitis.  Previously, it appeared to be well controlled with Flagyl.  Unfortunately, she developed peripheral neuropathy.  Because of this  development, she was stopped on the medication and subsequently she had  significant amount of diarrhea.  She was placed on ciprofloxacin and  then on Augmentin which failed to control her symptoms.  Subsequently,  with the use of budesonide and paregoric, her diarrhea was markedly  improved.  She did undergo a flexible sigmoidoscopy of the ileostomy  pouch and she was found to have chronic pouchitis.  Additionally, an EGD  was performed to rule out celiac disease which was negative for any  abnormalities.  The patient presented to the office on Monday, September 17, 2007, with complaints of a peristomal abscess.  She stated that the  swelling started approximately 2 weeks ago and at the time of  presentation, it appeared to be worsened.  Upon initial examination,  there is approximately 5-7 cm diameter  of a peristomal abscess.  It was  tender to palpation at the apex.  There was no drainage that was noted  at that time and he had fluctuance.  A CT scan was performed and it  confirmed a peristomal abscess and it did reveal significant amount of  edema within the pouch and there is the possibility of a fistulous  tract, although it is not confirmed at this time.  She is also noted to  have significant anemia with hemoglobin 6.6.  She denies having any  chest pain or shortness of breath, although she did feel weak and tired  subsequently.  She has a history of iron-deficiency anemias and has  received iron infusions by Dr. Earlie Server in the past.  Because of the  findings of the peristomal abscess, as well as the severe iron  deficiency anemia, she was admitted to the hospital to receive blood  transfusions.   PAST MEDICAL HISTORY AND PAST SURGICAL HISTORY:  As stated above.   FAMILY HISTORY:  Noncontributory.   SOCIAL HISTORY:  Negative for alcohol, tobacco, or illicit drug use.  She is an Forensic psychologist and married.   REVIEW OF SYSTEMS REVIEW OF SYSTEMS:  As stated above in history present  illness, otherwise negative.   ALLERGIES:  No known drug allergies.   PHYSICAL EXAMINATION:  VITAL SIGNS:  Pending at this time.  GENERAL: The patient is in no acute distress, although she is anxious.  HEENT:  Normocephalic and atraumatic.  Extraocular muscles intact.  NECK:  Supple.  No lymphadenopathy.  LUNGS:  Clear to auscultation bilaterally.  CARDIOVASCULAR:  Regular rate and rhythm.  ABDOMEN:  Flat, soft, nontender, and nondistended.  There is a  spontaneous drainage of the peristomal abscess with a 7-mm opening of  her wound and pus is able to be expressed from that area.  EXTREMITIES:  No clubbing, cyanosis, or edema.   LABORATORY VALUES:  On September 17, 2007, hemoglobin 6.6 and white blood  cell count is 5.8.  Basic metabolic panel was normal.   IMPRESSION:  1. Peristomal abscess.  2.  Pouchitis.  Because of the possibility of a fistulous tract, I will      request a surgical consultation to provide input in regards to      management of this current finding.  The CT is not conclusive at      this time and she may require a fistulogram.  Currently, I will      keep her on Augmentin and budesonide to control her symptoms.   PLAN:  1. To await surgical consultation.  2. Budesonide 9 mg one p.o. daily  3. Augmentin 875 mg one p.o. b.i.d.      Tory Emerald Benson Norway, MD  Electronically Signed     PDH/MEDQ  D:  09/19/2007  T:  09/20/2007  Job:  [id number]   cc:   Eilleen Kempf, MD  Jill Alexanders, M.D.

## 2010-10-19 NOTE — H&P (Signed)
NAMEKAISLEY, STIVERSON                ACCOUNT NO.:  [id number]   MEDICAL RECORD NO.:  [id number]          PATIENT TYPE:  INP   LOCATION:  [place]                         FACILITY:  [facility]   PHYSICIAN:  Tory Emerald. Benson Norway, MD    DATE OF BIRTH:  Sep 18, 1940   DATE OF ADMISSION:  11/06/2007  DATE OF DISCHARGE:                              HISTORY & PHYSICAL   PRIMARY CARE PHYSICIAN:  Jill Alexanders, M.D.   SURGEON:  Orson Ape. Rise Patience, M.D.   HISTORY OF PRESENT ILLNESS:  Reason for admission is dehydration.   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  The patient was admitted for short duration in  April 2009 with the development of peristomal abscess. At that time, the  abscess spontaneously drained and surgical consultation was obtained  from Dr. Rise Patience who felt that she required further evaluation over at  [facility] in regards to the type of pouch that she  has received. Unfortunately, I have not received any records since her  evaluation over at [facility], but  per the report by her husband, she was  felt to have Crohn's disease and subsequently she stated she was started  on Humira as well as Xifaxan.  The plan at that time was not to perform  any surgical intervention in regards to the pouch.  Unfortunately, her  symptoms have not improved and she has continued output through the  fistulous tract.  Per the patient's report and per the husband's report,  she has received approximately four injections of Humira.  Overall, the  patient feels much weaker and tired and per  her husband she is unable to  ambulate without assistance and her mentation is mildly changed.  This  is what prompted her presentation to the emergency room and for further  evaluation and treatment.   PAST MEDICAL HISTORY AND PAST SURGICAL HISTORY:  As stated above.   FAMILY HISTORY:  Is noncontributory.   SOCIAL HISTORY:  Is negative for alcohol, tobacco, or illicit drug use.   REVIEW OF SYSTEMS:  As stated above in history of present illness,  otherwise negative.   ALLERGIES:  NO KNOWN DRUG ALLERGIES.   PHYSICAL EXAMINATION:  VITAL SIGNS: Pending at this time.  GENERAL:  Generally, the patient is very fatigued and weak in appearance  requiring significant assistance in regards to transfer from the  wheelchair to the hospital bed.  She appears to be oriented x3, but  somewhat lethargic.  HEENT: Normocephalic, atraumatic.  Extraocular muscles intact.  NECK: Neck is supple with no lymphadenopathy.  LUNGS: Clear to auscultation bilaterally.  CARDIOVASCULAR:  Regular rate and rhythm.  ABDOMEN: Abdomen is flat, soft.  There is the ostomy  site in the right  lower quadrant and lateral to this area towards midline there is an open  fistulous tract which was previously noted during her admission in  April, 2009.  Additionally, there is a foul odor emanating from this  area.  There is no erythema and tenderness with palpation of the site.  EXTREMITIES:  No clubbing, cyanosis or edema.   LABORATORY VALUES:  Are pending at this time.   IMPRESSION:  1. Dehydration.  2. New diagnosis of Crohn's disease.   Unfortunately I do not have the records available in order to define the  treatment recommendations and plans from [facility].  I do feel, however,  from the husband's report, it does appear that she  may have Crohn disease and is being treated toward with the use of  Humira. Unfortunately, there does not appear to be any improvement with  the fistulous tract at  this time, although it may be too early to tell  as she has only received four doses of Humira.  Unfortunately, her  clinical status is that she is very weak in appearance and fatigued. I  feel that she is dehydrated as she had this similar presentation in the  past when she had a significant amount of diarrhea through the ostomy  site.  Fortunately, she was able to improve markedly with IV hydration  in the past.   PLAN:  1. Aggressive IV hydration.  2. Check blood work.  3. Obtain records from [facility].  4. Continue with Xifaxan at this time and Humira.      Tory Emerald Benson Norway, MD  Electronically Signed     PDH/MEDQ  D:  11/06/2007  T:  11/06/2007  Job:  [id number]   cc:   Jeanne Ivan, MD  [facility]  [place], [place] [address]

## 2010-10-25 ENCOUNTER — Other Ambulatory Visit (INDEPENDENT_AMBULATORY_CARE_PROVIDER_SITE_OTHER): Payer: Medicare Other

## 2010-10-25 DIAGNOSIS — E538 Deficiency of other specified B group vitamins: Secondary | ICD-10-CM

## 2010-10-25 MED ORDER — VITAMIN B-12 100 MCG PO TABS: 100.0000 ug | ORAL_TABLET | Freq: Every day | ORAL | Status: DC

## 2010-10-25 MED ORDER — CYANOCOBALAMIN 1000 MCG/ML IJ SOLN
1000.0000 ug | Freq: Once | INTRAMUSCULAR | Status: AC
  Administered 2010-10-25: 1000 ug via INTRAMUSCULAR

## 2010-11-02 ENCOUNTER — Encounter (HOSPITAL_BASED_OUTPATIENT_CLINIC_OR_DEPARTMENT_OTHER): Payer: BC Managed Care – PPO | Admitting: Internal Medicine

## 2010-11-02 DIAGNOSIS — D509 Iron deficiency anemia, unspecified: Secondary | ICD-10-CM

## 2010-11-02 DIAGNOSIS — K909 Intestinal malabsorption, unspecified: Secondary | ICD-10-CM

## 2010-11-23 ENCOUNTER — Other Ambulatory Visit (INDEPENDENT_AMBULATORY_CARE_PROVIDER_SITE_OTHER): Payer: BC Managed Care – PPO

## 2010-11-23 DIAGNOSIS — D510 Vitamin B12 deficiency anemia due to intrinsic factor deficiency: Secondary | ICD-10-CM

## 2010-11-23 MED ORDER — CYANOCOBALAMIN 1000 MCG/ML IJ SOLN
1000.0000 ug | Freq: Once | INTRAMUSCULAR | Status: AC
  Administered 2010-11-23: 1000 ug via INTRAMUSCULAR

## 2010-12-28 ENCOUNTER — Other Ambulatory Visit: Payer: Self-pay | Admitting: Internal Medicine

## 2010-12-28 ENCOUNTER — Encounter (HOSPITAL_BASED_OUTPATIENT_CLINIC_OR_DEPARTMENT_OTHER): Payer: Medicare Other | Admitting: Internal Medicine

## 2010-12-28 DIAGNOSIS — D509 Iron deficiency anemia, unspecified: Secondary | ICD-10-CM

## 2010-12-28 DIAGNOSIS — K909 Intestinal malabsorption, unspecified: Secondary | ICD-10-CM

## 2010-12-28 LAB — CBC WITH DIFFERENTIAL/PLATELET
BASO%: 0.3 % (ref 0.0–2.0)
Basophils Absolute: 0 10*3/uL (ref 0.0–0.1)
HCT: 28.2 % — ABNORMAL LOW (ref 34.8–46.6)
HGB: 8.9 g/dL — ABNORMAL LOW (ref 11.6–15.9)
MONO#: 0.4 10*3/uL (ref 0.1–0.9)
NEUT%: 61.1 % (ref 38.4–76.8)
WBC: 3.9 10*3/uL (ref 3.9–10.3)
lymph#: 0.8 10*3/uL — ABNORMAL LOW (ref 0.9–3.3)

## 2010-12-29 LAB — FOLATE RBC: RBC Folate: 2964 ng/mL (ref >=366)

## 2010-12-29 LAB — IRON AND TIBC: %SAT: 19 % — ABNORMAL LOW (ref 20–55)

## 2010-12-29 LAB — VITAMIN B12: Vitamin B-12: 527 pg/mL (ref 211–911)

## 2010-12-30 ENCOUNTER — Other Ambulatory Visit (INDEPENDENT_AMBULATORY_CARE_PROVIDER_SITE_OTHER): Payer: Medicare Other

## 2010-12-30 DIAGNOSIS — E538 Deficiency of other specified B group vitamins: Secondary | ICD-10-CM

## 2010-12-30 MED ORDER — CYANOCOBALAMIN 1000 MCG/ML IJ SOLN
1000.0000 ug | Freq: Once | INTRAMUSCULAR | Status: AC
  Administered 2010-12-30: 1000 ug via INTRAMUSCULAR

## 2011-01-04 ENCOUNTER — Encounter (HOSPITAL_BASED_OUTPATIENT_CLINIC_OR_DEPARTMENT_OTHER): Payer: Medicare Other | Admitting: Internal Medicine

## 2011-01-04 DIAGNOSIS — K909 Intestinal malabsorption, unspecified: Secondary | ICD-10-CM

## 2011-01-04 DIAGNOSIS — D509 Iron deficiency anemia, unspecified: Secondary | ICD-10-CM

## 2011-01-05 ENCOUNTER — Encounter (HOSPITAL_BASED_OUTPATIENT_CLINIC_OR_DEPARTMENT_OTHER): Payer: Medicare Other | Admitting: Internal Medicine

## 2011-01-05 DIAGNOSIS — K909 Intestinal malabsorption, unspecified: Secondary | ICD-10-CM

## 2011-01-05 DIAGNOSIS — D509 Iron deficiency anemia, unspecified: Secondary | ICD-10-CM

## 2011-01-12 ENCOUNTER — Encounter (HOSPITAL_BASED_OUTPATIENT_CLINIC_OR_DEPARTMENT_OTHER): Payer: Medicare Other | Admitting: Internal Medicine

## 2011-01-12 DIAGNOSIS — D509 Iron deficiency anemia, unspecified: Secondary | ICD-10-CM

## 2011-01-21 ENCOUNTER — Other Ambulatory Visit: Payer: Medicare Other

## 2011-01-21 DIAGNOSIS — E538 Deficiency of other specified B group vitamins: Secondary | ICD-10-CM

## 2011-01-21 MED ORDER — CYANOCOBALAMIN 1000 MCG/ML IJ SOLN: 1000.0000 ug | Freq: Once | INTRAMUSCULAR | Status: DC

## 2011-01-27 ENCOUNTER — Other Ambulatory Visit: Payer: Medicare Other

## 2011-02-25 LAB — COMPREHENSIVE METABOLIC PANEL
AST: 20
Albumin: 2.5 — ABNORMAL LOW
Calcium: 8.7
Chloride: 102
Creatinine, Ser: 1.18
GFR calc Af Amer: 56 — ABNORMAL LOW
Total Bilirubin: 0.6

## 2011-02-25 LAB — DIFFERENTIAL
Basophils Absolute: 0
Basophils Relative: 0
Eosinophils Absolute: 0
Eosinophils Relative: 0
Lymphocytes Relative: 8 — ABNORMAL LOW
Lymphs Abs: 0.4 — ABNORMAL LOW
Monocytes Absolute: 0.3
Monocytes Relative: 6
Neutro Abs: 4.5
Neutrophils Relative %: 85 — ABNORMAL HIGH

## 2011-02-25 LAB — CBC
MCV: 81.1
Platelets: 486 — ABNORMAL HIGH
WBC: 5.3

## 2011-02-25 LAB — FECAL LACTOFERRIN, QUANT

## 2011-02-25 LAB — URINALYSIS, ROUTINE W REFLEX MICROSCOPIC
Hgb urine dipstick: NEGATIVE
Protein, ur: NEGATIVE
Urobilinogen, UA: 0.2

## 2011-02-25 LAB — STOOL CULTURE

## 2011-02-25 LAB — CLOSTRIDIUM DIFFICILE EIA

## 2011-03-01 LAB — BASIC METABOLIC PANEL
BUN: 11
BUN: 12
CO2: 22
CO2: 23
CO2: 25
Calcium: 8.2 — ABNORMAL LOW
Calcium: 8.3 — ABNORMAL LOW
Chloride: 104
Chloride: 109
Creatinine, Ser: 0.8
Creatinine, Ser: 0.83
GFR calc Af Amer: >60
Glucose, Bld: 155 — ABNORMAL HIGH
Glucose, Bld: 85
Glucose, Bld: 93
Sodium: 136
Sodium: 138

## 2011-03-01 LAB — CROSSMATCH
ABO/RH(D): AB POS
Antibody Screen: NEGATIVE

## 2011-03-01 LAB — CBC
HCT: 36.6
Hemoglobin: 12
Hemoglobin: 12.4
MCHC: 32.6
MCHC: 32.7
MCV: 84.5
Platelets: 490 — ABNORMAL HIGH
RBC: 4.33
RDW: 20.6 — ABNORMAL HIGH
WBC: 3.4 — ABNORMAL LOW

## 2011-03-03 LAB — COMPREHENSIVE METABOLIC PANEL
ALT: 29
ALT: 32
ALT: 40 — ABNORMAL HIGH
AST: 25
AST: 29
AST: 31
AST: 14
Albumin: 1.3 — ABNORMAL LOW
Albumin: 1.4 — ABNORMAL LOW
Albumin: 1.6 — ABNORMAL LOW
Alkaline Phosphatase: 67
BUN: 10
BUN: 11
BUN: 18
CO2: 22
CO2: 23
CO2: 25
Calcium: 6.1 — CL
Calcium: 6.4 — CL
Calcium: 7.3 — ABNORMAL LOW
Calcium: 7.6 — ABNORMAL LOW
Calcium: 8 — ABNORMAL LOW
Chloride: 107
Chloride: 107
Chloride: 108
Chloride: 110
Creatinine, Ser: 0.54
Creatinine, Ser: 1.07
Creatinine, Ser: 0.58
Creatinine, Ser: 0.64
GFR calc Af Amer: >60
GFR calc Af Amer: >60
GFR calc Af Amer: >60
GFR calc Af Amer: >60
GFR calc non Af Amer: 51 — ABNORMAL LOW
GFR calc non Af Amer: >60
GFR calc non Af Amer: >60
GFR calc non Af Amer: >60
GFR calc non Af Amer: >60
Glucose, Bld: 119 — ABNORMAL HIGH
Glucose, Bld: 119 — ABNORMAL HIGH
Glucose, Bld: 130 — ABNORMAL HIGH
Potassium: 3.4 — ABNORMAL LOW
Potassium: 4.1
Sodium: 134 — ABNORMAL LOW
Sodium: 136
Sodium: 138
Total Bilirubin: 0.4
Total Bilirubin: 0.6
Total Protein: 4.5 — ABNORMAL LOW
Total Protein: 4.7 — ABNORMAL LOW
Total Protein: 5.4 — ABNORMAL LOW

## 2011-03-03 LAB — BASIC METABOLIC PANEL
BUN: 12
BUN: 16
BUN: 26 — ABNORMAL HIGH
BUN: 27 — ABNORMAL HIGH
CO2: 18 — ABNORMAL LOW
CO2: 22
CO2: 22
CO2: 23
CO2: 24
CO2: 24
CO2: 24
Calcium: 6.2 — CL
Calcium: 7.5 — ABNORMAL LOW
Calcium: 7.5 — ABNORMAL LOW
Calcium: 7.8 — ABNORMAL LOW
Calcium: 8.5
Calcium: 7.7 — ABNORMAL LOW
Chloride: 108
Chloride: 109
Chloride: 103
Chloride: 107
Creatinine, Ser: 0.51
Creatinine, Ser: 0.56
Creatinine, Ser: 0.59
Creatinine, Ser: 0.71
Creatinine, Ser: 0.72
Creatinine, Ser: 1.04
Creatinine, Ser: 1.53 — ABNORMAL HIGH
Creatinine, Ser: 0.54
Creatinine, Ser: 0.54
GFR calc Af Amer: 41 — ABNORMAL LOW
GFR calc Af Amer: >60
GFR calc Af Amer: >60
GFR calc Af Amer: >60
GFR calc Af Amer: >60
GFR calc non Af Amer: 33 — ABNORMAL LOW
GFR calc non Af Amer: 34 — ABNORMAL LOW
GFR calc non Af Amer: >60
GFR calc non Af Amer: >60
GFR calc non Af Amer: >60
GFR calc non Af Amer: >60
Glucose, Bld: 101 — ABNORMAL HIGH
Glucose, Bld: 105 — ABNORMAL HIGH
Glucose, Bld: 109 — ABNORMAL HIGH
Glucose, Bld: 112 — ABNORMAL HIGH
Glucose, Bld: 128 — ABNORMAL HIGH
Potassium: 3.6
Potassium: 4
Potassium: 4.7
Potassium: 4.3
Sodium: 127 — ABNORMAL LOW
Sodium: 139
Sodium: 132 — ABNORMAL LOW

## 2011-03-03 LAB — CBC
HCT: 23.6 — ABNORMAL LOW
HCT: 25.8 — ABNORMAL LOW
HCT: 30 — ABNORMAL LOW
HCT: 31.2 — ABNORMAL LOW
HCT: 34.8 — ABNORMAL LOW
HCT: 35.1 — ABNORMAL LOW
HCT: 35.9 — ABNORMAL LOW
Hemoglobin: 10.5 — ABNORMAL LOW
Hemoglobin: 8.6 — ABNORMAL LOW
Hemoglobin: 9.5 — ABNORMAL LOW
Hemoglobin: 11.8 — ABNORMAL LOW
MCHC: 33.2
MCHC: 33.4
MCHC: 33.4
MCHC: 33.4
MCHC: 33.7
MCHC: 33.7
MCHC: 33.9
MCHC: 33.9
MCHC: 33.5
MCHC: 33.9
MCHC: 34
MCV: 85.7
MCV: 85.9
MCV: 86
MCV: 86
MCV: 86.5
MCV: 87.4
MCV: 87.7
MCV: 87.8
MCV: 88
Platelets: 219
Platelets: 238
Platelets: 241
Platelets: 260
Platelets: 380
Platelets: 434 — ABNORMAL HIGH
Platelets: 191
Platelets: 259
RBC: 3.07 — ABNORMAL LOW
RBC: 3.2 — ABNORMAL LOW
RBC: 3.23 — ABNORMAL LOW
RBC: 3.34 — ABNORMAL LOW
RBC: 3.36 — ABNORMAL LOW
RBC: 3.56 — ABNORMAL LOW
RBC: 3.82 — ABNORMAL LOW
RBC: 2.91 — ABNORMAL LOW
RBC: 4
RDW: 16.1 — ABNORMAL HIGH
RDW: 16.2 — ABNORMAL HIGH
RDW: 16.3 — ABNORMAL HIGH
RDW: 16.4 — ABNORMAL HIGH
RDW: 16.6 — ABNORMAL HIGH
RDW: 16.6 — ABNORMAL HIGH
RDW: 17.9 — ABNORMAL HIGH
RDW: 15.5
RDW: 15.9 — ABNORMAL HIGH
RDW: 16.6 — ABNORMAL HIGH
WBC: 3.5 — ABNORMAL LOW
WBC: 3.6 — ABNORMAL LOW
WBC: 3.8 — ABNORMAL LOW
WBC: 4.1
WBC: 5.2
WBC: 3.3 — ABNORMAL LOW
WBC: 3.5 — ABNORMAL LOW

## 2011-03-03 LAB — TRIGLYCERIDES
Triglycerides: 144
Triglycerides: 55

## 2011-03-03 LAB — MAGNESIUM
Magnesium: 1.9
Magnesium: 2
Magnesium: 2
Magnesium: 2
Magnesium: 2.2
Magnesium: 1.9
Magnesium: 2.1
Magnesium: 2.1
Magnesium: 2.1

## 2011-03-03 LAB — DIFFERENTIAL
Basophils Absolute: 0
Basophils Relative: 0
Basophils Relative: 2 — ABNORMAL HIGH
Eosinophils Absolute: 0
Eosinophils Relative: 0
Lymphocytes Relative: 18
Lymphs Abs: 0.4 — ABNORMAL LOW
Lymphs Abs: 0.6 — ABNORMAL LOW
Lymphs Abs: 0.8
Monocytes Relative: 3
Monocytes Relative: 7
Neutro Abs: 2.9
Neutro Abs: 6.3
Neutrophils Relative %: 75
Neutrophils Relative %: 90 — ABNORMAL HIGH
Neutrophils Relative %: 70

## 2011-03-03 LAB — URINALYSIS, ROUTINE W REFLEX MICROSCOPIC
Bilirubin Urine: NEGATIVE
Hgb urine dipstick: NEGATIVE
Ketones, ur: NEGATIVE
Nitrite: NEGATIVE
Protein, ur: NEGATIVE
Specific Gravity, Urine: 1.008
Urobilinogen, UA: 0.2
Urobilinogen, UA: 0.2

## 2011-03-03 LAB — PHOSPHORUS
Phosphorus: 2.2 — ABNORMAL LOW
Phosphorus: 2.9
Phosphorus: 3.1
Phosphorus: 3.9
Phosphorus: 3
Phosphorus: 3.4
Phosphorus: 3.4
Phosphorus: 3.7

## 2011-03-03 LAB — PREALBUMIN
Prealbumin: 4.1 — ABNORMAL LOW
Prealbumin: 4.9 — ABNORMAL LOW
Prealbumin: 5.8 — ABNORMAL LOW

## 2011-03-03 LAB — CROSSMATCH
ABO/RH(D): AB POS
ABO/RH(D): AB POS
Antibody Screen: NEGATIVE

## 2011-03-03 LAB — CULTURE, BLOOD (ROUTINE X 2): Culture: NO GROWTH

## 2011-03-03 LAB — URINE CULTURE
Colony Count: NO GROWTH
Culture: NO GROWTH

## 2011-03-03 LAB — CHOLESTEROL, TOTAL: Cholesterol: 81

## 2011-03-07 ENCOUNTER — Other Ambulatory Visit (INDEPENDENT_AMBULATORY_CARE_PROVIDER_SITE_OTHER): Payer: Medicare Other

## 2011-03-07 DIAGNOSIS — E538 Deficiency of other specified B group vitamins: Secondary | ICD-10-CM

## 2011-03-07 MED ORDER — CYANOCOBALAMIN 1000 MCG/ML IJ SOLN
1000.0000 ug | Freq: Once | INTRAMUSCULAR | Status: AC
  Administered 2011-03-07: 1000 ug via INTRAMUSCULAR

## 2011-03-30 ENCOUNTER — Encounter (HOSPITAL_BASED_OUTPATIENT_CLINIC_OR_DEPARTMENT_OTHER): Payer: Medicare Other | Admitting: Internal Medicine

## 2011-03-30 ENCOUNTER — Other Ambulatory Visit: Payer: Self-pay | Admitting: Internal Medicine

## 2011-03-30 DIAGNOSIS — D509 Iron deficiency anemia, unspecified: Secondary | ICD-10-CM

## 2011-03-30 DIAGNOSIS — K909 Intestinal malabsorption, unspecified: Secondary | ICD-10-CM

## 2011-03-30 LAB — CBC WITH DIFFERENTIAL/PLATELET
BASO%: 0.6 % (ref 0.0–2.0)
Basophils Absolute: 0 10*3/uL (ref 0.0–0.1)
HCT: 26.7 % — ABNORMAL LOW (ref 34.8–46.6)
HGB: 8.9 g/dL — ABNORMAL LOW (ref 11.6–15.9)
MONO#: 0.4 10*3/uL (ref 0.1–0.9)
NEUT%: 59.4 % (ref 38.4–76.8)
RDW: 15.7 % — ABNORMAL HIGH (ref 11.2–14.5)
WBC: 3 10*3/uL — ABNORMAL LOW (ref 3.9–10.3)
lymph#: 0.8 10*3/uL — ABNORMAL LOW (ref 0.9–3.3)

## 2011-03-31 LAB — IRON AND TIBC
%SAT: 24 % (ref 20–55)
TIBC: 215 ug/dL — ABNORMAL LOW (ref 250–470)
UIBC: 163 ug/dL (ref 125–400)

## 2011-03-31 LAB — FOLATE RBC: RBC Folate: 3201 ng/mL (ref >=366)

## 2011-03-31 LAB — VITAMIN B12: Vitamin B-12: 675 pg/mL (ref 211–911)

## 2011-04-07 ENCOUNTER — Encounter (HOSPITAL_BASED_OUTPATIENT_CLINIC_OR_DEPARTMENT_OTHER): Payer: Medicare Other | Admitting: Internal Medicine

## 2011-04-07 DIAGNOSIS — K909 Intestinal malabsorption, unspecified: Secondary | ICD-10-CM

## 2011-04-07 DIAGNOSIS — D509 Iron deficiency anemia, unspecified: Secondary | ICD-10-CM

## 2011-04-08 ENCOUNTER — Other Ambulatory Visit: Payer: Self-pay | Admitting: Family Medicine

## 2011-04-08 DIAGNOSIS — Z1231 Encounter for screening mammogram for malignant neoplasm of breast: Secondary | ICD-10-CM

## 2011-04-11 NOTE — Progress Notes (Signed)
CC:   Jeanne Ivan, MD Vikki Ports, M.D. 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  PRIOR THERAPY:  Intravenous Feraheme infusion on as-needed basis.  Last dose was given on January 12, 2011.  CURRENT THERAPY:  None.  INTERIM HISTORY:  Kim [name] came to the clinic today for her routine 3- month followup visit.  Kim [name] is feeling very well today.  She denied having any specific complaints.  She is currently on treatment with Humira under the care of Dr. Emelda Fear, and now the frequency was decreased to every other week.  The patient denied having any significant bleeding issues.  Denied having any nausea or vomiting.  She had a repeat CBC and anemia panel performed on March 30, 2011, and she is here for evaluation and discussion of her lab results.  REVIEW OF SYSTEMS:  She has only mild fatigue.  Otherwise review of system was negative.  PHYSICAL EXAMINATION:  Vital Signs:  Blood pressure 114/44, pulse 67, respiratory rate 20, temperature 97.2, weight 89.2 pounds.  General Exam:  Very pleasant 70 year old white female, awake, alert, no acute distress.  HEENT:  Normocephalic, atraumatic.  Clear oropharynx.  Neck Exam:  Supple, no palpable lymphadenopathy.  Chest Exam:  Clear to auscultation.  No wheezes or crackles.  Cardiovascular Exam:  Normal S1, S2.  No murmur or gallop.  Abdomen Exam:  Soft, nontender, nondistended. No masses.  Extremities:  No edema.  LABORATORY DATA:  CBC and anemia panel on 03/30/2011, showed white blood count 3.0, hemoglobin 8.9, hematocrit 26.7, platelets 205.  Iron level was normal at 52, total iron binding capacity 215, iron saturation 24%. Ferritin level was normal at 289.  RBC folate was normal at 3201. Vitamin B12 level was also normal at 675.  ASSESSMENT AND PLAN:  This is a very pleasant 69 year old white female with a history of iron  deficiency anemia secondary to malabsorption syndrome.  The patient's anemia panel is normal today, but she continues to have low hemoglobin and hematocrit as well as decline in her leukocyte count.  This is most likely secondary to her treatment with Humira, which is causing myelosuppression with decrease in the total white blood count as well as the red blood cells.  I had a lengthy discussion with Kim [name] today about her condition.  I asked her to contact Dr. Emelda Fear to readjust her dose of Humira since the patient has no other evidence of a nutritional deficiency to explain her anemia and leukocytopenia at this point.  I will see her back for followup visit in 1 month for re-evaluation and repeat CBC and LDH.    ______________________________ Kim [name], M.D. MKM/MEDQ  D:  04/09/2011  T:  04/11/2011  Job:  [id number]

## 2011-04-12 ENCOUNTER — Other Ambulatory Visit: Payer: Medicare Other

## 2011-04-12 DIAGNOSIS — E538 Deficiency of other specified B group vitamins: Secondary | ICD-10-CM

## 2011-04-12 MED ORDER — CYANOCOBALAMIN 1000 MCG/ML IJ SOLN
1000.0000 ug | Freq: Once | INTRAMUSCULAR | Status: AC
  Administered 2011-04-12: 1000 ug via INTRAMUSCULAR

## 2011-04-12 MED ORDER — CYANOCOBALAMIN 1000 MCG/ML IJ SOLN: 1000.0000 ug | Freq: Once | INTRAMUSCULAR | Status: DC

## 2011-04-13 ENCOUNTER — Encounter: Payer: Self-pay | Admitting: Family Medicine

## 2011-05-03 ENCOUNTER — Other Ambulatory Visit: Payer: Self-pay | Admitting: Internal Medicine

## 2011-05-03 ENCOUNTER — Telehealth: Payer: Self-pay | Admitting: Internal Medicine

## 2011-05-03 DIAGNOSIS — D649 Anemia, unspecified: Secondary | ICD-10-CM

## 2011-05-03 DIAGNOSIS — K5090 Crohn's disease, unspecified, without complications: Secondary | ICD-10-CM

## 2011-05-03 NOTE — Telephone Encounter (Signed)
Please sign orders.

## 2011-05-06 ENCOUNTER — Other Ambulatory Visit (HOSPITAL_BASED_OUTPATIENT_CLINIC_OR_DEPARTMENT_OTHER): Payer: Medicare Other

## 2011-05-06 DIAGNOSIS — K909 Intestinal malabsorption, unspecified: Secondary | ICD-10-CM

## 2011-05-06 DIAGNOSIS — K5090 Crohn's disease, unspecified, without complications: Secondary | ICD-10-CM

## 2011-05-06 DIAGNOSIS — D649 Anemia, unspecified: Secondary | ICD-10-CM

## 2011-05-06 LAB — CBC WITH DIFFERENTIAL/PLATELET
Eosinophils Absolute: 0.1 10*3/uL (ref 0.0–0.5)
HCT: 29 % — ABNORMAL LOW (ref 34.8–46.6)
LYMPH%: 21.3 % (ref 14.0–49.7)
MCV: 90.4 fL (ref 79.5–101.0)
MONO#: 0.4 10*3/uL (ref 0.1–0.9)
MONO%: 12.3 % (ref 0.0–14.0)
NEUT#: 2.2 10*3/uL (ref 1.5–6.5)
NEUT%: 63.2 % (ref 38.4–76.8)
Platelets: 245 10*3/uL (ref 145–400)
WBC: 3.6 10*3/uL — ABNORMAL LOW (ref 3.9–10.3)

## 2011-05-06 LAB — COMPREHENSIVE METABOLIC PANEL
ALT: 16 U/L (ref 0–35)
Alkaline Phosphatase: 44 U/L (ref 39–117)
CO2: 26 mEq/L (ref 19–32)
Creatinine, Ser: 1.11 mg/dL — ABNORMAL HIGH (ref 0.50–1.10)
Glucose, Bld: 89 mg/dL (ref 70–99)
Sodium: 135 mEq/L (ref 135–145)
Total Bilirubin: 0.1 mg/dL — ABNORMAL LOW (ref 0.3–1.2)
Total Protein: 6.2 g/dL (ref 6.0–8.3)

## 2011-05-07 NOTE — Progress Notes (Signed)
Quick Note:  Call patient with the result and order K Dur 20 meq po qd x7 days ______

## 2011-05-09 ENCOUNTER — Other Ambulatory Visit: Payer: Medicare Other

## 2011-05-09 ENCOUNTER — Encounter: Payer: Self-pay | Admitting: *Deleted

## 2011-05-09 DIAGNOSIS — E538 Deficiency of other specified B group vitamins: Secondary | ICD-10-CM

## 2011-05-09 MED ORDER — CYANOCOBALAMIN 1000 MCG/ML IJ SOLN: 1000.0000 ug | Freq: Once | INTRAMUSCULAR | Status: DC

## 2011-05-10 ENCOUNTER — Ambulatory Visit (HOSPITAL_BASED_OUTPATIENT_CLINIC_OR_DEPARTMENT_OTHER): Payer: Medicare Other | Admitting: Internal Medicine

## 2011-05-10 ENCOUNTER — Other Ambulatory Visit: Payer: Medicare Other | Admitting: Lab

## 2011-05-10 VITALS — BP 114/60 | HR 63 | Temp 97.4°F | Ht <= 58 in | Wt 85.1 lb

## 2011-05-10 DIAGNOSIS — D509 Iron deficiency anemia, unspecified: Secondary | ICD-10-CM | POA: Insufficient documentation

## 2011-05-10 NOTE — Progress Notes (Signed)
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  PRIOR THERAPY: Intravenous Feraheme infusion on as-needed basis.  Last dose was given on January 12, 2011.  CURRENT THERAPY: None.  INTERVAL HISTORY: Kim [name] 70 y.o. female returns to the clinic today for followup visit. She came today for evaluation. She is feeling much better than last visit. She denied having any significant fatigue or weakness. She has no significant chest pain or shortness breath, no cough or hemoptysis, no significant weight loss or night sweats. The patient has repeat CBC performed recently and she is here for evaluation and discussion of her lab results  MEDICAL HISTORY: Past Medical History  Diagnosis Date  . Osteoporosis   . Arthritis     OSTEO  . Colitis     ALLERGIES:  is allergic to sulfa antibiotics.  MEDICATIONS:  No current outpatient prescriptions on file.   Current Facility-Administered Medications  Medication Dose Route Frequency Provider Last Rate Last Dose  . cyanocobalamin ((VITAMIN B-12)) injection 1,000 mcg  1,000 mcg Intramuscular Once Wyatt Haste, MD        REVIEW OF SYSTEMS:  A comprehensive review of systems was negative.   PHYSICAL EXAMINATION: General appearance: alert, cooperative and no distress Head: Normocephalic, without obvious abnormality, atraumatic Neck: no adenopathy Lymph nodes: Cervical, supraclavicular, and axillary nodes normal. Resp: clear to auscultation bilaterally Cardio: regular rate and rhythm, S1, S2 normal, no murmur, click, rub or gallop GI: soft, non-tender; bowel sounds normal; no masses,  no organomegaly Extremities: extremities normal, atraumatic, no cyanosis or edema Neurologic: Alert and oriented X 3, normal strength and tone. Normal symmetric reflexes.  Normal coordination and gait  Blood pressure 114/60, pulse 63, temperature 97.4 F (36.3 C), temperature source Oral, height 4' 10"  (1.473 m), weight 85 lb 1.6 oz (38.601 kg).  LABORATORY DATA: Lab Results  Component Value Date   WBC 3.6* 05/06/2011   HGB 9.7* 05/06/2011   HCT 29.0* 05/06/2011   MCV 90.4 05/06/2011   PLT 245 05/06/2011      Chemistry      Component Value Date/Time   NA 135 05/06/2011 1607   K 3.3* 05/06/2011 1607   CL 103 05/06/2011 1607   CO2 26 05/06/2011 1607   BUN 31* 05/06/2011 1607   CREATININE 1.11* 05/06/2011 1607      Component Value Date/Time   CALCIUM 8.9 05/06/2011 1607   ALKPHOS 44 05/06/2011 1607   AST 22 05/06/2011 1607   ALT 16 05/06/2011 1607   BILITOT 0.1* 05/06/2011 1607      ASSESSMENT: This is a very pleasant 70 years old white female with iron deficiency anemia secondary to malabsorption syndrome. The patient is doing fine and she has no significant complaints today. There is some improvement in her hemoglobin and hematocrit as well as white blood count. I discussed the lab result with the patient today.  PLAN: I will continue her on observation for now with repeat CBC see Mick and iron study in 2 months as well as in 4 months. The patient would come back for followup visit in 4 months. For the hypokalemia I started the patient on K-Dur 20 mEq by mouth daily for 7 days.  All questions were answered. The patient knows to call the clinic with any problems, questions or concerns. We can certainly see the patient much sooner if  necessary.

## 2011-05-12 ENCOUNTER — Ambulatory Visit
Admission: RE | Admit: 2011-05-12 | Discharge: 2011-05-12 | Disposition: A | Discharge status: Home / Self Care | Payer: Medicare Other | Source: Ambulatory Visit | Attending: Family Medicine | Admitting: Family Medicine

## 2011-05-12 DIAGNOSIS — Z1231 Encounter for screening mammogram for malignant neoplasm of breast: Secondary | ICD-10-CM

## 2011-05-16 ENCOUNTER — Other Ambulatory Visit: Payer: Self-pay | Admitting: *Deleted

## 2011-05-16 ENCOUNTER — Encounter: Payer: Self-pay | Admitting: *Deleted

## 2011-05-16 DIAGNOSIS — E876 Hypokalemia: Secondary | ICD-10-CM

## 2011-05-16 MED ORDER — POTASSIUM CHLORIDE CRYS ER 20 MEQ PO TBCR: 20.0000 meq | EXTENDED_RELEASE_TABLET | Freq: Every day | ORAL | Status: DC

## 2011-06-10 ENCOUNTER — Other Ambulatory Visit: Payer: Medicare Other

## 2011-06-10 DIAGNOSIS — E539 Vitamin B deficiency, unspecified: Secondary | ICD-10-CM

## 2011-06-10 MED ORDER — CYANOCOBALAMIN 1000 MCG/ML IJ SOLN
1000.0000 ug | Freq: Once | INTRAMUSCULAR | Status: AC
  Administered 2011-06-10: 1000 ug via INTRAMUSCULAR

## 2011-07-11 ENCOUNTER — Other Ambulatory Visit (INDEPENDENT_AMBULATORY_CARE_PROVIDER_SITE_OTHER): Payer: Medicare Other

## 2011-07-11 ENCOUNTER — Other Ambulatory Visit (HOSPITAL_BASED_OUTPATIENT_CLINIC_OR_DEPARTMENT_OTHER): Payer: Medicare Other

## 2011-07-11 ENCOUNTER — Telehealth: Payer: Self-pay | Admitting: *Deleted

## 2011-07-11 DIAGNOSIS — D649 Anemia, unspecified: Secondary | ICD-10-CM

## 2011-07-11 DIAGNOSIS — E538 Deficiency of other specified B group vitamins: Secondary | ICD-10-CM

## 2011-07-11 DIAGNOSIS — D509 Iron deficiency anemia, unspecified: Secondary | ICD-10-CM

## 2011-07-11 LAB — CBC WITH DIFFERENTIAL/PLATELET
Eosinophils Absolute: 0.1 10*3/uL (ref 0.0–0.5)
HGB: 9.3 g/dL — ABNORMAL LOW (ref 11.6–15.9)
MONO#: 0.4 10*3/uL (ref 0.1–0.9)
NEUT#: 2.9 10*3/uL (ref 1.5–6.5)
RBC: 3.32 10*6/uL — ABNORMAL LOW (ref 3.70–5.45)
RDW: 15.6 % — ABNORMAL HIGH (ref 11.2–14.5)
WBC: 4 10*3/uL (ref 3.9–10.3)
lymph#: 0.7 10*3/uL — ABNORMAL LOW (ref 0.9–3.3)

## 2011-07-11 LAB — FERRITIN: Ferritin: 92 ng/mL (ref 10–291)

## 2011-07-11 LAB — IRON AND TIBC
%SAT: 14 % — ABNORMAL LOW (ref 20–55)
Iron: 36 ug/dL — ABNORMAL LOW (ref 42–145)
TIBC: 257 ug/dL (ref 250–470)
UIBC: 221 ug/dL (ref 125–400)

## 2011-07-11 MED ORDER — CYANOCOBALAMIN 1000 MCG/ML IJ SOLN
1000.0000 ug | Freq: Once | INTRAMUSCULAR | Status: AC
  Administered 2011-07-11: 1000 ug via INTRAMUSCULAR

## 2011-07-11 NOTE — Telephone Encounter (Signed)
Patient coming in today for B12 injection @ 1:30 then going to [place] lab to have labs drawn for Dr.Mohammed @ [facility]. She is scheduled for a CPE with you in March and would like to know what labs you need for that CPE so she can have them drawn today. Thanks.

## 2011-07-11 NOTE — Telephone Encounter (Signed)
Does she have a paper chart for me to review?  No lipid profile in computer.  If none in paper chart, then needs lipid panel V77.91 I also don't see if a vitamin D level was ever checked, when was last DEXA or if she has dx of osteopenia. I see she is on Fosamax, so likely has either osteopenia or osteoporosis, but can't access her "history" for dx to cover vitamin D (computer is only letting me look at problem list, not history...)

## 2011-07-11 NOTE — Telephone Encounter (Signed)
rx written for pt to take to lab for vitamin D level and lipids. DX:vit d def and dyslipidemia.

## 2011-07-12 LAB — COMPREHENSIVE METABOLIC PANEL
Albumin: 3 g/dL — ABNORMAL LOW (ref 3.5–5.2)
BUN: 33 mg/dL — ABNORMAL HIGH (ref 6–23)
CO2: 23 mEq/L (ref 19–32)
Glucose, Bld: 95 mg/dL (ref 70–99)
Potassium: 2.9 mEq/L — ABNORMAL LOW (ref 3.5–5.3)
Sodium: 143 mEq/L (ref 135–145)
Total Protein: 5.8 g/dL — ABNORMAL LOW (ref 6.0–8.3)

## 2011-07-12 NOTE — Progress Notes (Signed)
Quick Note:  Call patient with the result and she needs to take K Dur 40 meq po qd for 7-10 days. ______

## 2011-07-13 ENCOUNTER — Telehealth: Payer: Self-pay | Admitting: Internal Medicine

## 2011-07-13 NOTE — Telephone Encounter (Signed)
I left a message for pt to call regarding labs and scheduling feraheme if she wants

## 2011-07-14 ENCOUNTER — Telehealth: Payer: Self-pay | Admitting: Internal Medicine

## 2011-07-14 ENCOUNTER — Other Ambulatory Visit: Payer: Self-pay | Admitting: Internal Medicine

## 2011-07-14 NOTE — Telephone Encounter (Signed)
called pt with 2/12 appt aom

## 2011-07-14 NOTE — Telephone Encounter (Signed)
07/14/11-pt returned my call and wants to take feraheme. Next Monday or Tuesday is good time. I will send onc treatment request . I told her someone will call her with date and time

## 2011-07-15 ENCOUNTER — Other Ambulatory Visit: Payer: Self-pay | Admitting: Internal Medicine

## 2011-07-18 ENCOUNTER — Other Ambulatory Visit: Payer: Self-pay | Admitting: *Deleted

## 2011-07-18 ENCOUNTER — Ambulatory Visit: Payer: Medicare Other

## 2011-07-18 DIAGNOSIS — E876 Hypokalemia: Secondary | ICD-10-CM

## 2011-07-18 MED ORDER — POTASSIUM CHLORIDE 40 MEQ/15ML (20%) PO LIQD: 40.0000 meq | ORAL | Status: DC

## 2011-07-19 ENCOUNTER — Ambulatory Visit (HOSPITAL_BASED_OUTPATIENT_CLINIC_OR_DEPARTMENT_OTHER): Payer: Medicare Other

## 2011-07-19 VITALS — BP 98/58 | HR 57 | Temp 97.0°F

## 2011-07-19 DIAGNOSIS — D509 Iron deficiency anemia, unspecified: Secondary | ICD-10-CM

## 2011-07-19 MED ORDER — FERUMOXYTOL INJECTION 510 MG/17 ML
510.0000 mg | Freq: Once | INTRAVENOUS | Status: AC
  Administered 2011-07-19: 510 mg via INTRAVENOUS
  Filled 2011-07-19: qty 17

## 2011-07-19 MED ORDER — SODIUM CHLORIDE 0.9 % IV SOLN
Freq: Once | INTRAVENOUS | Status: AC
  Administered 2011-07-19: 09:00:00 via INTRAVENOUS

## 2011-07-19 NOTE — Progress Notes (Signed)
Time of assessment 0902

## 2011-07-19 NOTE — Patient Instructions (Signed)
Pt to call with concerns

## 2011-07-22 ENCOUNTER — Telehealth: Payer: Self-pay | Admitting: Internal Medicine

## 2011-07-22 NOTE — Telephone Encounter (Signed)
michelle had called pt with inf. time on 2/19 pt req 9 not 8,l/m for michelle to ck if avail   advised pt we would c/baom

## 2011-07-25 ENCOUNTER — Telehealth: Payer: Self-pay | Admitting: *Deleted

## 2011-07-25 NOTE — Telephone Encounter (Signed)
Pt called with new appt time on 2/19 at 9:00   Per pt aom

## 2011-07-26 ENCOUNTER — Ambulatory Visit (HOSPITAL_BASED_OUTPATIENT_CLINIC_OR_DEPARTMENT_OTHER): Payer: Medicare Other

## 2011-07-26 VITALS — BP 102/61 | HR 71 | Temp 97.9°F

## 2011-07-26 DIAGNOSIS — D509 Iron deficiency anemia, unspecified: Secondary | ICD-10-CM

## 2011-07-26 MED ORDER — FERUMOXYTOL INJECTION 510 MG/17 ML
510.0000 mg | Freq: Once | INTRAVENOUS | Status: AC
  Administered 2011-07-26: 510 mg via INTRAVENOUS
  Filled 2011-07-26: qty 17

## 2011-07-26 MED ORDER — SODIUM CHLORIDE 0.9 % IV SOLN
Freq: Once | INTRAVENOUS | Status: AC
  Administered 2011-07-26: 09:00:00 via INTRAVENOUS

## 2011-08-08 ENCOUNTER — Encounter: Payer: Medicare Other | Admitting: Family Medicine

## 2011-08-12 ENCOUNTER — Encounter: Payer: Self-pay | Admitting: Internal Medicine

## 2011-08-22 ENCOUNTER — Encounter: Payer: Self-pay | Admitting: Family Medicine

## 2011-08-22 ENCOUNTER — Ambulatory Visit (INDEPENDENT_AMBULATORY_CARE_PROVIDER_SITE_OTHER): Payer: Medicare Other | Admitting: Family Medicine

## 2011-08-22 VITALS — BP 100/60 | HR 64 | Ht <= 58 in | Wt 79.0 lb

## 2011-08-22 DIAGNOSIS — Z13220 Encounter for screening for lipoid disorders: Secondary | ICD-10-CM

## 2011-08-22 DIAGNOSIS — Z0000 Encounter for general adult medical examination without abnormal findings: Secondary | ICD-10-CM

## 2011-08-22 DIAGNOSIS — M810 Age-related osteoporosis without current pathological fracture: Secondary | ICD-10-CM

## 2011-08-22 DIAGNOSIS — E876 Hypokalemia: Secondary | ICD-10-CM

## 2011-08-22 LAB — POCT URINALYSIS DIPSTICK
Blood, UA: NEGATIVE
Ketones, UA: NEGATIVE
Protein, UA: NEGATIVE
Spec Grav, UA: 1.01
pH, UA: 5

## 2011-08-22 LAB — BASIC METABOLIC PANEL
CO2: 29 mEq/L (ref 19–32)
Calcium: 8.7 mg/dL (ref 8.4–10.5)
Chloride: 99 mEq/L (ref 96–112)
Potassium: 3.2 mEq/L — ABNORMAL LOW (ref 3.5–5.3)
Sodium: 137 mEq/L (ref 135–145)

## 2011-08-22 LAB — LIPID PANEL: LDL Cholesterol: 64 mg/dL (ref 0–99)

## 2011-08-22 NOTE — Progress Notes (Signed)
Kim [name] is a 71 y.o. female who presents for a complete physical.  She has the following concerns:  fasting annual exam, had some labs done 07/11/11 for other doctors and we did get some results, no TSH or lipids. Needs PAP. Did not do eye exam per pt request as she has glaucoma and gets her eyes checked every 3 months.  She denies any specific concerns today.  See ROS.  Health Maintenance: Immunization History  Administered Date(s) Administered  . Influenza Whole 03/14/2007, 02/21/2008  . Pneumococcal Conjugate 03/28/2006  . Tdap 07/24/2010  gets flu shots yearly Had Zostavax from pharmacy about 3 years ago Last Pap smear: 08/2010, no h/o abnormal paps Last mammogram:05/13/2011 Last colonoscopy: UTD, under regular care of GI Last DEXA: managed by [facility]--has appt soon (gets every 2 years) Dentist: twice yearly Ophtho: every 3 months for glaucoma Exercise: 2-3 times/week, treadmill and weights  Past Medical History  Diagnosis Date  . Osteoporosis     managed by Dr. Nevada Crane at [facility], [place] q2 yrs  . OA (osteoarthritis)     hands,back  . Crohn disease     Dr. Emelda Fear at [facility]  . Glaucoma     Dr. Janyth Contes  . Intestinal malabsorption   . Anemia     related to malabsorption--gets B12 shots and iron infusions, managed by Dr. Julien Nordmann    Past Surgical History  Procedure Date  . Total colectomy age 33  . Internal ostomy pouch 1994    History   Social History  . Marital Status: Married    Spouse Name: N/A    Number of Children: 2  . Years of Education: N/A   Occupational History  . 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 Smoking status: Former Smoker    Quit date: 06/06/1972  . Smokeless tobacco: Never Used  . Alcohol Use: No  . Drug Use: No  . Sexually Active: Not Currently   Other Topics Concern  . Not on file   Social History Narrative   Lives with her husband, no pets.  Children in [place] and [place].  Husband has Parkinsons Disease    Family History    Problem Relation Age of Onset  . Diabetes Mother     borderline  . Heart disease Mother     MI  . Heart disease Father   . Thyroid disease Sister     Current outpatient prescriptions:Adalimumab (HUMIRA [place]), Inject into the skin once a week. , Disp: , Rfl: ;  alendronate (FOSAMAX) 35 MG tablet, Take 35 mg by mouth every 7 (seven) days. Take with a full glass of water on an empty stomach. , Disp: , Rfl: ;  B Complex-C (B-COMPLEX WITH VITAMIN C) tablet, Take 1 tablet by mouth daily., Disp: , Rfl:  Calcium Carbonate-Vitamin D (CALCIUM + D PO), Take 4 tablets by mouth daily. Equates to 1530m of Ca+ and 1,000IU of vitamin D, Disp: , Rfl: ;  ciprofloxacin (CIPRO) 250 MG tablet, Take 250 mg by mouth 2 (two) times daily. Takes 1 tablet daily, but increases to BID depending on symptoms of pouchitis, Disp: , Rfl: ;  dorzolamide-timolol (COSOPT) 22.3-6.8 MG/ML ophthalmic solution, 1 drop daily.  , Disp: , Rfl:  Flaxseed, Linseed, (FLAX SEED OIL) 1000 MG CAPS, Take 1 capsule by mouth daily., Disp: , Rfl: ;  Glucosamine-Chondroit-Vit C-Mn (GLUCOSAMINE 1500 COMPLEX PO), Take by mouth daily.  , Disp: , Rfl: ;  hydrOXYzine (ATARAX/VISTARIL) 25 MG tablet, Take 25  mg by mouth as needed.  , Disp: , Rfl: ;  Multiple Vitamin (MULTIVITAMIN) tablet, Take 1 tablet by mouth daily.  , Disp: , Rfl:  Omega-3 Fatty Acids (FISH OIL) 1200 MG CAPS, Take 1 capsule by mouth daily., Disp: , Rfl:  Current facility-administered medications:DISCONTD: cyanocobalamin ((VITAMIN B-12)) injection 1,000 mcg, 1,000 mcg, Intramuscular, Once, Denita Lung, MD  Allergies  Allergen Reactions  . Sulfa Antibiotics Other (See Comments)    unknown   ROS: The patient denies anorexia, fever, weight changes, headaches,  vision changes, decreased hearing, ear pain, sore throat, breast concerns, chest pain, palpitations, dizziness, syncope, dyspnea on exertion, cough, swelling, nausea, vomiting, indigestion/heartburn, hematuria, incontinence,  dysuria, vaginal bleeding, discharge, odor or itch, genital lesions, joint pains, numbness, tingling, weakness, tremor, suspicious skin lesions, depression, anxiety, abnormal bleeding/bruising, or enlarged lymph nodes.  Abdominal discomfort and gas daily.  Last flare of crohns was a couple of weeks ago. Recent yeast infection, treated with OTC Monistat.  Occasional leg cramps, but she "walks it out"  PHYSICAL EXAM: BP 100/60  Pulse 64  Ht 4' 9"  (1.448 m)  Wt 70 lb (31.752 kg)  BMI 15.15 kg/m2  General Appearance:    Alert, cooperative, no distress, appears stated age, very thin female  Head:    Normocephalic, without obvious abnormality, atraumatic  Eyes:    PERRL, conjunctiva/corneas clear, EOM's intact, fundi    Benign, slight arcus senilis  Ears:    Normal TM's and external ear canals  Nose:   Nares normal, mucosa normal, no drainage or sinus   tenderness  Throat:   Lips, mucosa, and tongue normal; teeth and gums normal  Neck:   Supple, no lymphadenopathy;  thyroid:  no   enlargement/tenderness/nodules; no carotid   bruit or JVD  Back:    Spine nontender, no curvature, ROM normal, no CVA     tenderness  Lungs:     Clear to auscultation bilaterally without wheezes, rales or     ronchi; respirations unlabored  Chest Wall:    No tenderness or deformity   Heart:    Regular rate and rhythm, S1 and S2 normal, no murmur, rub   or gallop  Breast Exam:    No tenderness, masses, or nipple discharge or inversion.      No axillary lymphadenopathy  Abdomen:     Soft, non-tender, nondistended, normoactive bowel sounds,    no masses, no hepatosplenomegaly.  Pouch RLQ with some granulation tissue. No drainage, nontender  Genitalia:    Normal external genitalia without lesions.  BUS and vagina normal; no cervical motion tenderness. No abnormal vaginal discharge.  Uterus and adnexa not enlarged, nontender, no masses (although eval of L adnexa is somewhat limited by presence of pouch).  Pap not performed   Rectal:   not performed  Extremities:   No clubbing, cyanosis or edema  Pulses:   2+ and symmetric all extremities  Skin:   Skin color, texture, turgor normal, no rashes or lesions  Lymph nodes:   Cervical, supraclavicular, and axillary nodes normal  Neurologic:   CNII-XII intact, normal strength, sensation and gait; reflexes 2+ and symmetric throughout          Psych:   Normal mood, affect, hygiene and grooming.    ASSESSMENT/PLAN: 1. Routine general medical examination at a health care facility  POCT Urinalysis Dipstick, TSH  2. Hypokalemia  Basic metabolic panel  3. Osteoporosis  Vitamin D 25 hydroxy  4. Screening for lipoid disorders  Lipid panel  Hypokalemia 2/4--recheck b-met today Lipids, vitamin D, TSH  Pt wanted pap--explained reasons for not doing, very low risk and that insurance only covers every 2 years.  She is willing to skip this year and have it next year.  Discussed monthly self breast exams and yearly mammograms after the age of 50; at least 30 minutes of aerobic activity at least 5 days/week; proper sunscreen use reviewed; healthy diet, including goals of calcium and vitamin D intake and alcohol recommendations (less than or equal to 1 drink/day) reviewed; regular seatbelt use; changing batteries in smoke detectors.  Immunization recommendations discussed.  Colonoscopy recommendations reviewed

## 2011-08-22 NOTE — Patient Instructions (Addendum)
HEALTH MAINTENANCE RECOMMENDATIONS:  It is recommended that you get at least 30 minutes of aerobic exercise at least 5 days/week (for weight loss, you may need as much as 60-90 minutes). This can be any activity that gets your heart rate up. This can be divided in 10-15 minute intervals if needed, but try and build up your endurance at least once a week.  Weight bearing exercise is also recommended twice weekly.  Eat a healthy diet with lots of vegetables, fruits and fiber.  "Colorful" foods have a lot of vitamins (ie green vegetables, tomatoes, red peppers, etc).  Limit sweet tea, regular sodas and alcoholic beverages, all of which has a lot of calories and sugar.  Up to 1 alcoholic drink daily may be beneficial for women (unless trying to lose weight, watch sugars).  Drink a lot of water.  Calcium recommendations are 1200-1500 mg daily (1500 mg for postmenopausal women or women without ovaries), and vitamin D 1000 IU daily.  This should be obtained from diet and/or supplements (vitamins), and calcium should not be taken all at once, but in divided doses.  Monthly self breast exams and yearly mammograms for women over the age of 22 is recommended.  Sunscreen of at least SPF 30 should be used on all sun-exposed parts of the skin when outside between the hours of 10 am and 4 pm (not just when at beach or pool, but even with exercise, golf, tennis, and yard work!)  Use a sunscreen that says "broad spectrum" so it covers both UVA and UVB rays, and make sure to reapply every 1-2 hours.  Remember to change the batteries in your smoke detectors when changing your clock times in the spring and fall.  Use your seat belt every time you are in a car, and please drive safely and not be distracted with cell phones and texting while driving.  Remember that your TOTAL intake of calcium should be 1533m--from diet, vitamins, etc.

## 2011-08-23 ENCOUNTER — Encounter: Payer: Self-pay | Admitting: Family Medicine

## 2011-08-29 ENCOUNTER — Other Ambulatory Visit (INDEPENDENT_AMBULATORY_CARE_PROVIDER_SITE_OTHER): Payer: Medicare Other

## 2011-08-29 DIAGNOSIS — E538 Deficiency of other specified B group vitamins: Secondary | ICD-10-CM

## 2011-08-29 MED ORDER — CYANOCOBALAMIN 1000 MCG/ML IJ SOLN
1000.0000 ug | Freq: Once | INTRAMUSCULAR | Status: AC
  Administered 2011-08-29: 1000 ug via INTRAMUSCULAR

## 2011-09-05 ENCOUNTER — Other Ambulatory Visit (HOSPITAL_BASED_OUTPATIENT_CLINIC_OR_DEPARTMENT_OTHER): Payer: Medicare Other | Admitting: Lab

## 2011-09-05 DIAGNOSIS — D509 Iron deficiency anemia, unspecified: Secondary | ICD-10-CM

## 2011-09-05 DIAGNOSIS — D649 Anemia, unspecified: Secondary | ICD-10-CM

## 2011-09-05 LAB — COMPREHENSIVE METABOLIC PANEL
Albumin: 2.5 g/dL — ABNORMAL LOW (ref 3.5–5.2)
Alkaline Phosphatase: 44 U/L (ref 39–117)
CO2: 27 mEq/L (ref 19–32)
Glucose, Bld: 94 mg/dL (ref 70–99)
Potassium: 3 mEq/L — ABNORMAL LOW (ref 3.5–5.3)
Sodium: 136 mEq/L (ref 135–145)
Total Protein: 6.9 g/dL (ref 6.0–8.3)

## 2011-09-05 LAB — CBC
MCH: 28.8 pg (ref 25.1–34.0)
RBC: 3.39 10*6/uL — ABNORMAL LOW (ref 3.70–5.45)
RDW: 19.2 % — ABNORMAL HIGH (ref 11.2–14.5)

## 2011-09-05 LAB — IRON AND TIBC
%SAT: 17 % — ABNORMAL LOW (ref 20–55)
Iron: 37 ug/dL — ABNORMAL LOW (ref 42–145)

## 2011-09-05 NOTE — Progress Notes (Signed)
Quick Note:  Call patient with the result and order K dur 40 meq po qd X 7 days. ______

## 2011-09-06 ENCOUNTER — Telehealth: Payer: Self-pay | Admitting: *Deleted

## 2011-09-06 DIAGNOSIS — E876 Hypokalemia: Secondary | ICD-10-CM

## 2011-09-06 MED ORDER — POTASSIUM CHLORIDE CRYS ER 20 MEQ PO TBCR: 40.0000 meq | EXTENDED_RELEASE_TABLET | Freq: Every day | ORAL | Status: DC

## 2011-09-06 NOTE — Telephone Encounter (Addendum)
Called and spoke to pt, she verbalized understanding regarding 90mq x 7 days.  SLJ     Message copied by JBritt Bottomon Tue Sep 06, 2011  4:48 PM ------      Message from: MCurt Bears     Created: Mon Sep 05, 2011  5:54 PM       Call patient with the result and order K dur 40 meq po qd X 7 days.

## 2011-09-12 ENCOUNTER — Ambulatory Visit (HOSPITAL_BASED_OUTPATIENT_CLINIC_OR_DEPARTMENT_OTHER): Payer: Medicare Other | Admitting: Internal Medicine

## 2011-09-12 DIAGNOSIS — K912 Postsurgical malabsorption, not elsewhere classified: Secondary | ICD-10-CM

## 2011-09-12 DIAGNOSIS — D509 Iron deficiency anemia, unspecified: Secondary | ICD-10-CM

## 2011-09-12 DIAGNOSIS — D539 Nutritional anemia, unspecified: Secondary | ICD-10-CM

## 2011-09-12 DIAGNOSIS — K5090 Crohn's disease, unspecified, without complications: Secondary | ICD-10-CM

## 2011-09-12 NOTE — Progress Notes (Signed)
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   PRIOR THERAPY: Intravenous Feraheme infusion on as-needed basis. Last dose was given on January 12, 2011.   CURRENT THERAPY: None.   INTERVAL HISTORY: Kim Kim [name] 71 y.o. female returns to the clinic today for Kim [name] routine 4 month  followup visit. She is feeling fine today with no specific complaints except for mild fatigue. She has repeat CBC, iron study, ferritin and comprehensive metabolic panel performed recently and she is here today for evaluation and discussion of her lab results. She denied having any dizzy spells. No weight loss or night sweats. No chest pain or shortness of breath.  MEDICAL HISTORY: Past Medical History  Diagnosis Date  . Osteoporosis     managed by Dr. Nevada Crane at [facility], [place] q2 yrs  . OA (osteoarthritis)     hands,back  . Crohn disease     Dr. Emelda Fear at [facility]  . Glaucoma     Dr. Janyth Contes  . Intestinal malabsorption   . Anemia     related to malabsorption--gets B12 shots and iron infusions, managed by Dr. Julien Nordmann    ALLERGIES:  is allergic to sulfa antibiotics.  MEDICATIONS:  Current Outpatient Prescriptions  Medication Sig Dispense Refill  . Adalimumab (HUMIRA [place]) Inject into the skin once Kim [name] week.       Marland Kitchen alendronate (FOSAMAX) 35 MG tablet Take 35 mg by mouth every 7 (seven) days. Take with Kim [name] full glass of water on an empty stomach.       . B Complex-C (B-COMPLEX WITH VITAMIN C) tablet Take 1 tablet by mouth daily.      . Calcium Carbonate-Vitamin D (CALCIUM + D PO) Take 4 tablets by mouth daily. Equates to 1533m of Ca+ and 1,000IU of vitamin D      . ciprofloxacin (CIPRO) 250 MG tablet Take 250 mg by mouth 2 (two) times daily. Takes 1 tablet daily, but increases to BID  depending on symptoms of pouchitis      . dorzolamide-timolol (COSOPT) 22.3-6.8 MG/ML ophthalmic solution 1 drop daily.        . Flaxseed, Linseed, (FLAX SEED OIL) 1000 MG CAPS Take 1 capsule by mouth daily.      . Glucosamine-Chondroit-Vit C-Mn (GLUCOSAMINE 1500 COMPLEX PO) Take by mouth daily.        . hydrOXYzine (ATARAX/VISTARIL) 25 MG tablet Take 25 mg by mouth as needed.        . Multiple Vitamin (MULTIVITAMIN) tablet Take 1 tablet by mouth daily.        . Omega-3 Fatty Acids (FISH OIL) 1200 MG CAPS Take 1 capsule by mouth daily.      . potassium chloride SA (K-DUR,KLOR-CON) 20 MEQ tablet Take 2 tablets (40 mEq total) by mouth daily. 495m daily x 7 days.  60 tablet  0  . DISCONTD: potassium chloride 40 MEQ/15ML (20%) LIQD Take 15 mLs (40 mEq total) by mouth as directed.  150 mL  0    SURGICAL HISTORY:  Past Surgical History  Procedure Date  . Total colectomy age 89. Internal ostomy pouch 1994    REVIEW OF SYSTEMS:  Kim [name] comprehensive review of systems was negative except for: Constitutional: positive for fatigue   PHYSICAL EXAMINATION: General appearance: alert, cooperative and no distress Neck:  no adenopathy Lymph nodes: Cervical, supraclavicular, and axillary nodes normal. Resp: clear to auscultation bilaterally Cardio: regular rate and rhythm, S1, S2 normal, no murmur, click, rub or gallop GI: soft, non-tender; bowel sounds normal; no masses,  no organomegaly Extremities: extremities normal, atraumatic, no cyanosis or edema Neurologic: Alert and oriented X 3, normal strength and tone. Normal symmetric reflexes. Normal coordination and gait  ECOG PERFORMANCE STATUS: 1 - Symptomatic but completely ambulatory  There were no vitals taken for this visit.  LABORATORY DATA: Lab Results  Component Value Date   WBC 5.7 09/05/2011   HGB 9.8* 09/05/2011   HCT 30.0* 09/05/2011   MCV 88.7 09/05/2011   PLT 146 09/05/2011      Chemistry      Component Value Date/Time   NA 136 09/05/2011  1558   K 3.0* 09/05/2011 1558   CL 101 09/05/2011 1558   CO2 27 09/05/2011 1558   BUN 34* 09/05/2011 1558   CREATININE 1.13* 09/05/2011 1558   CREATININE 1.04 08/22/2011 1106      Component Value Date/Time   CALCIUM 8.8 09/05/2011 1558   ALKPHOS 44 09/05/2011 1558   AST 16 09/05/2011 1558   ALT 11 09/05/2011 1558   BILITOT 0.2* 09/05/2011 1558       RADIOGRAPHIC STUDIES: No results found.  ASSESSMENT: This is Kim [name] very pleasant 71 years old white female with history of iron deficiency anemia secondary to malabsorption syndrome secondary to Crohn's disease and colon resection. The patient is doing fine today with no significant complaints except for mild fatigue. Her serum iron is low on the recent blood work.  PLAN: I discussed the lab result with the patient today. I gave her the option of continuous observation with repeat blood work in 2-3 months versus proceeding with iron infusion in the form of Feraheme. The patient requested the iron infusion. This will be in the form of Feraheme 510 mg intravenous weekly x2 doses. First dose will be given on 07/17/2011. I would see her back for followup visit in 4 months with repeat CBC, iron study, ferritin, B12 level and serum folate. She was advised to call me immediately if she has any concerning symptoms in the interval. Kim Kim [name] has Kim [name] lot of questions today and I answered them completely to her satisfaction.  All questions were answered. The patient knows to call the clinic with any problems, questions or concerns. We can certainly see the patient much sooner if necessary.  I spent 20 minutes counseling the patient face to face. The total time spent in the appointment was 35 minutes.

## 2011-09-14 ENCOUNTER — Telehealth: Payer: Self-pay | Admitting: Internal Medicine

## 2011-09-14 ENCOUNTER — Ambulatory Visit (HOSPITAL_BASED_OUTPATIENT_CLINIC_OR_DEPARTMENT_OTHER): Payer: Medicare Other

## 2011-09-14 ENCOUNTER — Ambulatory Visit: Payer: Medicare Other

## 2011-09-14 VITALS — BP 102/61 | HR 80 | Temp 97.9°F

## 2011-09-14 DIAGNOSIS — D509 Iron deficiency anemia, unspecified: Secondary | ICD-10-CM

## 2011-09-14 MED ORDER — SODIUM CHLORIDE 0.9 % IV SOLN
Freq: Once | INTRAVENOUS | Status: AC
  Administered 2011-09-14: 16:00:00 via INTRAVENOUS

## 2011-09-14 MED ORDER — FERUMOXYTOL INJECTION 510 MG/17 ML
510.0000 mg | Freq: Once | INTRAVENOUS | Status: AC
  Administered 2011-09-14: 510 mg via INTRAVENOUS
  Filled 2011-09-14: qty 17

## 2011-09-14 NOTE — Telephone Encounter (Signed)
pt aware of her appt today and 4/17   aom

## 2011-09-14 NOTE — Patient Instructions (Signed)
[facility] Discharge Instructions for Patients Receiving Chemotherapy  Today you received the following chemotherapy agents Feraheme  To help prevent nausea and vomiting after your treatment, we encourage you to take your nausea medication as prescribed    If you develop nausea and vomiting that is not controlled by your nausea medication, call the clinic. If it is after clinic hours your family physician or the after hours number for the clinic or go to the Emergency Department.   BELOW ARE SYMPTOMS THAT SHOULD BE REPORTED IMMEDIATELY:  *FEVER GREATER THAN 100.5 F  *CHILLS WITH OR WITHOUT FEVER  NAUSEA AND VOMITING THAT IS NOT CONTROLLED WITH YOUR NAUSEA MEDICATION  *UNUSUAL SHORTNESS OF BREATH  *UNUSUAL BRUISING OR BLEEDING  TENDERNESS IN MOUTH AND THROAT WITH OR WITHOUT PRESENCE OF ULCERS  *URINARY PROBLEMS  *BOWEL PROBLEMS  UNUSUAL RASH Items with * indicate a potential emergency and should be followed up as soon as possible.  One of the nurses will contact you 24 hours after your treatment. Please let the nurse know about any problems that you may have experienced. Feel free to call the clinic you have any questions or concerns. The clinic phone number is [phone].   I have been informed and understand all the instructions given to me. I know to contact the clinic, my physician, or go to the Emergency Department if any problems should occur. I do not have any questions at this time, but understand that I may call the clinic during office hours   should I have any questions or need assistance in obtaining follow up care.    __________________________________________  _____________  __________ Signature of Patient or Authorized Representative            Date                   Time    __________________________________________ Nurse's Signature

## 2011-09-21 ENCOUNTER — Ambulatory Visit (HOSPITAL_BASED_OUTPATIENT_CLINIC_OR_DEPARTMENT_OTHER): Payer: Medicare Other

## 2011-09-21 DIAGNOSIS — D509 Iron deficiency anemia, unspecified: Secondary | ICD-10-CM

## 2011-09-21 MED ORDER — SODIUM CHLORIDE 0.9 % IV SOLN
Freq: Once | INTRAVENOUS | Status: AC
  Administered 2011-09-21: 16:00:00 via INTRAVENOUS

## 2011-09-21 MED ORDER — FERUMOXYTOL INJECTION 510 MG/17 ML
510.0000 mg | Freq: Once | INTRAVENOUS | Status: AC
  Administered 2011-09-21: 510 mg via INTRAVENOUS
  Filled 2011-09-21: qty 17

## 2011-09-29 ENCOUNTER — Other Ambulatory Visit (INDEPENDENT_AMBULATORY_CARE_PROVIDER_SITE_OTHER): Payer: Medicare Other

## 2011-09-29 DIAGNOSIS — E538 Deficiency of other specified B group vitamins: Secondary | ICD-10-CM

## 2011-09-29 MED ORDER — CYANOCOBALAMIN 1000 MCG/ML IJ SOLN
1000.0000 ug | Freq: Once | INTRAMUSCULAR | Status: AC
  Administered 2011-09-29: 1000 ug via INTRAMUSCULAR

## 2011-10-05 ENCOUNTER — Other Ambulatory Visit: Payer: Medicare Other

## 2011-11-08 ENCOUNTER — Other Ambulatory Visit (INDEPENDENT_AMBULATORY_CARE_PROVIDER_SITE_OTHER): Payer: Medicare Other

## 2011-11-08 DIAGNOSIS — D509 Iron deficiency anemia, unspecified: Secondary | ICD-10-CM

## 2011-11-08 DIAGNOSIS — E611 Iron deficiency: Secondary | ICD-10-CM

## 2011-11-08 MED ORDER — CYANOCOBALAMIN 1000 MCG/ML IJ SOLN
1000.0000 ug | Freq: Once | INTRAMUSCULAR | Status: AC
  Administered 2011-11-08: 1000 ug via INTRAMUSCULAR

## 2011-11-29 ENCOUNTER — Other Ambulatory Visit: Payer: Self-pay | Admitting: Oncology

## 2011-11-29 ENCOUNTER — Ambulatory Visit (HOSPITAL_BASED_OUTPATIENT_CLINIC_OR_DEPARTMENT_OTHER): Payer: Medicare Other | Admitting: Lab

## 2011-11-29 ENCOUNTER — Telehealth: Payer: Self-pay | Admitting: Medical Oncology

## 2011-11-29 DIAGNOSIS — D509 Iron deficiency anemia, unspecified: Secondary | ICD-10-CM

## 2011-11-29 LAB — CBC WITH DIFFERENTIAL/PLATELET
BASO%: 0.4 % (ref 0.0–2.0)
EOS%: 1.4 % (ref 0.0–7.0)
HCT: 27.7 % — ABNORMAL LOW (ref 34.8–46.6)
LYMPH%: 15.5 % (ref 14.0–49.7)
MCH: 30.9 pg (ref 25.1–34.0)
MCHC: 33 g/dL (ref 31.5–36.0)
MCV: 93.6 fL (ref 79.5–101.0)
MONO#: 0.6 10*3/uL (ref 0.1–0.9)
NEUT%: 67.3 % (ref 38.4–76.8)
Platelets: 227 10*3/uL (ref 145–400)

## 2011-11-29 NOTE — Telephone Encounter (Signed)
Called Kim [name] with CBC result.  She wants to wait for Dr Vista Mink to return on Monday for follow-up of CBC results. I instructed her to go to [facility] ED if she feels worse.

## 2011-11-29 NOTE — Telephone Encounter (Signed)
Per Mikey Bussing CBC today -pt notified.

## 2011-12-06 ENCOUNTER — Other Ambulatory Visit: Payer: Self-pay | Admitting: *Deleted

## 2011-12-06 NOTE — Progress Notes (Signed)
Pt stated she has had very low energy and has a CBC done Friday and her hbg was lower then what it has been (9.1).  She wants to know whether she needs to get her ferritin checked or can just fereheme.  Dr Vista Mink reviewed CBC done on Friday.  Per Dr Vista Mink, okay to give 2 doses of fereheme or if she would like to get ferritin checked as well we can.  Pt stated she wants to go ahead and get the iron infusion.  Onc tx schedule filled out.  SLJ

## 2011-12-09 ENCOUNTER — Other Ambulatory Visit: Payer: Self-pay | Admitting: Internal Medicine

## 2011-12-12 ENCOUNTER — Ambulatory Visit: Payer: Medicare Other

## 2011-12-13 ENCOUNTER — Ambulatory Visit (HOSPITAL_BASED_OUTPATIENT_CLINIC_OR_DEPARTMENT_OTHER): Payer: Medicare Other

## 2011-12-13 VITALS — BP 106/53 | HR 73 | Temp 97.0°F

## 2011-12-13 DIAGNOSIS — D509 Iron deficiency anemia, unspecified: Secondary | ICD-10-CM

## 2011-12-13 MED ORDER — FERUMOXYTOL INJECTION 510 MG/17 ML
510.0000 mg | Freq: Once | INTRAVENOUS | Status: AC
  Administered 2011-12-13: 510 mg via INTRAVENOUS
  Filled 2011-12-13: qty 17

## 2011-12-13 MED ORDER — SODIUM CHLORIDE 0.9 % IV SOLN: Freq: Once | INTRAVENOUS | Status: DC

## 2011-12-14 ENCOUNTER — Telehealth: Payer: Self-pay | Admitting: Internal Medicine

## 2011-12-14 NOTE — Telephone Encounter (Signed)
l/m with appt r/s from 01/2012 to 03/2012 per pt req ok per mkm   aom

## 2011-12-19 ENCOUNTER — Ambulatory Visit: Payer: Medicare Other

## 2011-12-20 ENCOUNTER — Ambulatory Visit (HOSPITAL_BASED_OUTPATIENT_CLINIC_OR_DEPARTMENT_OTHER): Payer: Medicare Other

## 2011-12-20 ENCOUNTER — Other Ambulatory Visit (INDEPENDENT_AMBULATORY_CARE_PROVIDER_SITE_OTHER): Payer: Medicare Other

## 2011-12-20 VITALS — BP 117/69 | HR 68 | Temp 97.6°F

## 2011-12-20 DIAGNOSIS — E538 Deficiency of other specified B group vitamins: Secondary | ICD-10-CM

## 2011-12-20 DIAGNOSIS — D509 Iron deficiency anemia, unspecified: Secondary | ICD-10-CM

## 2011-12-20 MED ORDER — FERUMOXYTOL INJECTION 510 MG/17 ML
510.0000 mg | Freq: Once | INTRAVENOUS | Status: AC
  Administered 2011-12-20: 510 mg via INTRAVENOUS
  Filled 2011-12-20: qty 17

## 2011-12-20 MED ORDER — CYANOCOBALAMIN 1000 MCG/ML IJ SOLN
1000.0000 ug | Freq: Once | INTRAMUSCULAR | Status: AC
  Administered 2011-12-20: 1000 ug via INTRAMUSCULAR

## 2011-12-20 MED ORDER — SODIUM CHLORIDE 0.9 % IV SOLN
Freq: Once | INTRAVENOUS | Status: AC
  Administered 2011-12-20: 10:00:00 via INTRAVENOUS

## 2011-12-20 NOTE — Patient Instructions (Signed)
Feraheme (Ferumoxytol)  What is this medicine? FERUMOXYTOL is an iron complex. Iron is used to make healthy red blood cells, which carry oxygen and nutrients throughout the body. This medicine is used to treat iron deficiency anemia in people with chronic kidney disease. This medicine may be used for other purposes; ask your health care provider or pharmacist if you have questions.  What should I tell my health care provider before I take this medicine? They need to know if you have any of these conditions: -anemia not caused by low iron levels -high levels of iron in the blood -magnetic resonance imaging (MRI) test scheduled -an unusual or allergic reaction to iron, other medicines, foods, dyes, or preservatives -pregnant or trying to get pregnant -breast-feeding  How should I use this medicine? This medicine is for infusion into a vein. It is given by a health care professional in a hospital or clinic setting. Talk to your pediatrician regarding the use of this medicine in children. Special care may be needed. Overdosage: If you think you've taken too much of this medicine contact a poison control center or emergency room at once. Overdosage: If you think you have taken too much of this medicine contact a poison control center or emergency room at once. NOTE: This medicine is only for you. Do not share this medicine with others. What if I miss a dose? It is important not to miss your dose. Call your doctor or health care professional if you are unable to keep an appointment.  What may interact with this medicine? This medicine may interact with the following medications: -other iron products This list may not describe all possible interactions. Give your health care provider a list of all the medicines, herbs, non-prescription drugs, or dietary supplements you use. Also tell them if you smoke, drink alcohol, or use illegal drugs. Some items may interact with your medicine.  What should  I watch for while using this medicine? Visit your doctor or healthcare professional regularly. Tell your doctor or healthcare professional if your symptoms do not start to get better or if they get worse. You may need blood work done while you are taking this medicine. You may need to follow a special diet. Talk to your doctor. Foods that contain iron include: whole grains/cereals, dried fruits, beans, or peas, leafy green vegetables, and organ meats (liver, kidney).  What side effects may I notice from receiving this medicine? Side effects that you should report to your doctor or health care professional as soon as possible: -allergic reactions like skin rash, itching or hives, swelling of the face, lips, or tongue -breathing problems -changes in blood pressure -feeling faint or lightheaded, falls -fever or chills -flushing, sweating, or hot feelings -swelling of the ankles or feet  Side effects that usually do not require medical attention (Report these to your doctor or health care professional if they continue or are bothersome.): -diarrhea -headache -nausea, vomiting -stomach pain This list may not describe all possible side effects. Call your doctor for medical advice about side effects. You may report side effects to FDA at 1-800-FDA-1088. Where should I keep my medicine? This drug is given in a hospital or clinic and will not be stored at home. NOTE: This sheet is a summary. It may not cover all possible information. If you have questions about this medicine, talk to your doctor, pharmacist, or health care provider.  2012, Elsevier/Gold Standard. (02/13/2008 9:48:25 PM)

## 2011-12-23 ENCOUNTER — Encounter: Payer: Self-pay | Admitting: *Deleted

## 2011-12-23 NOTE — Progress Notes (Signed)
GI outpatient consult with Dr Emelda Fear from 12/19/11 given to Dr Vista Mink to review, progress note will be scanned into EPIC.  SLJ

## 2012-01-09 ENCOUNTER — Other Ambulatory Visit: Payer: Medicare Other | Admitting: Lab

## 2012-01-16 ENCOUNTER — Ambulatory Visit: Payer: Medicare Other | Admitting: Internal Medicine

## 2012-01-19 ENCOUNTER — Other Ambulatory Visit (INDEPENDENT_AMBULATORY_CARE_PROVIDER_SITE_OTHER): Payer: Medicare Other

## 2012-01-19 DIAGNOSIS — E538 Deficiency of other specified B group vitamins: Secondary | ICD-10-CM

## 2012-01-19 MED ORDER — CYANOCOBALAMIN 1000 MCG/ML IJ SOLN
1000.0000 ug | Freq: Once | INTRAMUSCULAR | Status: AC
  Administered 2012-01-19: 1000 ug via INTRAMUSCULAR

## 2012-02-14 ENCOUNTER — Other Ambulatory Visit: Payer: Self-pay | Admitting: Family Medicine

## 2012-02-14 DIAGNOSIS — Z1231 Encounter for screening mammogram for malignant neoplasm of breast: Secondary | ICD-10-CM

## 2012-02-16 ENCOUNTER — Other Ambulatory Visit (INDEPENDENT_AMBULATORY_CARE_PROVIDER_SITE_OTHER): Payer: Medicare Other

## 2012-02-16 DIAGNOSIS — E538 Deficiency of other specified B group vitamins: Secondary | ICD-10-CM

## 2012-02-16 MED ORDER — CYANOCOBALAMIN 1000 MCG/ML IJ SOLN
1000.0000 ug | Freq: Once | INTRAMUSCULAR | Status: AC
  Administered 2012-02-16: 1000 ug via INTRAMUSCULAR

## 2012-03-13 ENCOUNTER — Other Ambulatory Visit: Payer: Self-pay | Admitting: Medical Oncology

## 2012-03-13 ENCOUNTER — Other Ambulatory Visit (HOSPITAL_BASED_OUTPATIENT_CLINIC_OR_DEPARTMENT_OTHER): Payer: Medicare Other | Admitting: Lab

## 2012-03-13 ENCOUNTER — Telehealth: Payer: Self-pay | Admitting: Medical Oncology

## 2012-03-13 DIAGNOSIS — D649 Anemia, unspecified: Secondary | ICD-10-CM

## 2012-03-13 DIAGNOSIS — D539 Nutritional anemia, unspecified: Secondary | ICD-10-CM

## 2012-03-13 DIAGNOSIS — D509 Iron deficiency anemia, unspecified: Secondary | ICD-10-CM

## 2012-03-13 LAB — IRON AND TIBC
Iron: 23 ug/dL — ABNORMAL LOW (ref 42–145)
UIBC: 179 ug/dL (ref 125–400)

## 2012-03-13 LAB — CBC WITH DIFFERENTIAL/PLATELET
BASO%: 0.2 % (ref 0.0–2.0)
Eosinophils Absolute: 0.1 10*3/uL (ref 0.0–0.5)
LYMPH%: 13.7 % — ABNORMAL LOW (ref 14.0–49.7)
MCHC: 30.5 g/dL — ABNORMAL LOW (ref 31.5–36.0)
MONO#: 0.7 10*3/uL (ref 0.1–0.9)
NEUT#: 3.5 10*3/uL (ref 1.5–6.5)
RBC: 2.73 10*6/uL — ABNORMAL LOW (ref 3.70–5.45)
RDW: 16.5 % — ABNORMAL HIGH (ref 11.2–14.5)
WBC: 4.9 10*3/uL (ref 3.9–10.3)
lymph#: 0.7 10*3/uL — ABNORMAL LOW (ref 0.9–3.3)
nRBC: 0 % (ref 0–0)

## 2012-03-13 LAB — FERRITIN: Ferritin: 599 ng/mL — ABNORMAL HIGH (ref 10–291)

## 2012-03-13 LAB — FOLATE: Folate: >20 ng/mL

## 2012-03-13 NOTE — Progress Notes (Signed)
Reviewed labs with pt. She reports she is very tired and cold all the time . I scheduled her for type and cross match but i am  waiting to hear about transfusion appt date and time

## 2012-03-14 ENCOUNTER — Telehealth: Payer: Self-pay | Admitting: Internal Medicine

## 2012-03-14 ENCOUNTER — Other Ambulatory Visit: Payer: Self-pay | Admitting: *Deleted

## 2012-03-14 ENCOUNTER — Encounter (HOSPITAL_COMMUNITY)
Admission: RE | Admit: 2012-03-14 | Discharge: 2012-03-14 | Disposition: A | Discharge status: Home / Self Care | Payer: Medicare Other | Source: Ambulatory Visit | Attending: Internal Medicine | Admitting: Internal Medicine

## 2012-03-14 ENCOUNTER — Other Ambulatory Visit: Payer: Medicare Other | Admitting: Lab

## 2012-03-14 ENCOUNTER — Ambulatory Visit (HOSPITAL_BASED_OUTPATIENT_CLINIC_OR_DEPARTMENT_OTHER): Payer: Medicare Other

## 2012-03-14 VITALS — BP 92/54 | HR 66 | Temp 97.2°F | Resp 14

## 2012-03-14 DIAGNOSIS — D649 Anemia, unspecified: Secondary | ICD-10-CM

## 2012-03-14 DIAGNOSIS — D509 Iron deficiency anemia, unspecified: Secondary | ICD-10-CM

## 2012-03-14 MED ORDER — DIPHENHYDRAMINE HCL 50 MG/ML IJ SOLN
25.0000 mg | Freq: Once | INTRAMUSCULAR | Status: AC
  Administered 2012-03-14: 25 mg via INTRAVENOUS

## 2012-03-14 MED ORDER — ACETAMINOPHEN 325 MG PO TABS
650.0000 mg | ORAL_TABLET | Freq: Once | ORAL | Status: AC
  Administered 2012-03-14: 650 mg via ORAL

## 2012-03-14 NOTE — Telephone Encounter (Signed)
s/w spouse and stated that she was aware of the appt time today   aom

## 2012-03-15 ENCOUNTER — Ambulatory Visit (HOSPITAL_BASED_OUTPATIENT_CLINIC_OR_DEPARTMENT_OTHER): Payer: Medicare Other

## 2012-03-15 VITALS — BP 91/55 | HR 69 | Temp 98.6°F | Resp 16

## 2012-03-15 DIAGNOSIS — D509 Iron deficiency anemia, unspecified: Secondary | ICD-10-CM

## 2012-03-15 DIAGNOSIS — D649 Anemia, unspecified: Secondary | ICD-10-CM

## 2012-03-15 MED ORDER — ACETAMINOPHEN 325 MG PO TABS
650.0000 mg | ORAL_TABLET | Freq: Once | ORAL | Status: AC
  Administered 2012-03-15: 650 mg via ORAL

## 2012-03-15 MED ORDER — SODIUM CHLORIDE 0.9 % IV SOLN
250.0000 mL | Freq: Once | INTRAVENOUS | Status: AC
  Administered 2012-03-15: 250 mL via INTRAVENOUS

## 2012-03-15 MED ORDER — DIPHENHYDRAMINE HCL 50 MG/ML IJ SOLN
25.0000 mg | Freq: Once | INTRAMUSCULAR | Status: AC
  Administered 2012-03-15: 25 mg via INTRAVENOUS

## 2012-03-15 NOTE — Patient Instructions (Addendum)
Blood Transfusion Information WHAT IS A BLOOD TRANSFUSION? A transfusion is the replacement of blood or some of its parts. Blood is made up of multiple cells which provide different functions.  Red blood cells carry oxygen and are used for blood loss replacement.  White blood cells fight against infection.  Platelets control bleeding.  Plasma helps clot blood.  Other blood products are available for specialized needs, such as hemophilia or other clotting disorders. BEFORE THE TRANSFUSION  Who gives blood for transfusions?   You may be able to donate blood to be used at a later date on yourself (autologous donation).  Relatives can be asked to donate blood. This is generally not any safer than if you have received blood from a stranger. The same precautions are taken to ensure safety when a relative's blood is donated.  Healthy volunteers who are fully evaluated to make sure their blood is safe. This is blood bank blood. Transfusion therapy is the safest it has ever been in the practice of medicine. Before blood is taken from a donor, a complete history is taken to make sure that person has no history of diseases nor engages in risky social behavior (examples are intravenous drug use or sexual activity with multiple partners). The donor's travel history is screened to minimize risk of transmitting infections, such as malaria. The donated blood is tested for signs of infectious diseases, such as HIV and hepatitis. The blood is then tested to be sure it is compatible with you in order to minimize the chance of a transfusion reaction. If you or a relative donates blood, this is often done in anticipation of surgery and is not appropriate for emergency situations. It takes many days to process the donated blood. RISKS AND COMPLICATIONS Although transfusion therapy is very safe and saves many lives, the main dangers of transfusion include:   Getting an infectious disease.  Developing a  transfusion reaction. This is an allergic reaction to something in the blood you were given. Every precaution is taken to prevent this. The decision to have a blood transfusion has been considered carefully by your caregiver before blood is given. Blood is not given unless the benefits outweigh the risks. AFTER THE TRANSFUSION  Right after receiving a blood transfusion, you will usually feel much better and more energetic. This is especially true if your red blood cells have gotten low (anemic). The transfusion raises the level of the red blood cells which carry oxygen, and this usually causes an energy increase.  The nurse administering the transfusion will monitor you carefully for complications. HOME CARE INSTRUCTIONS  No special instructions are needed after a transfusion. You may find your energy is better. Speak with your caregiver about any limitations on activity for underlying diseases you may have. SEEK MEDICAL CARE IF:   Your condition is not improving after your transfusion.  You develop redness or irritation at the intravenous (IV) site. SEEK IMMEDIATE MEDICAL CARE IF:  Any of the following symptoms occur over the next 12 hours:  Shaking chills.  You have a temperature by mouth above 102 F (38.9 C), not controlled by medicine.  Chest, back, or muscle pain.  People around you feel you are not acting correctly or are confused.  Shortness of breath or difficulty breathing.  Dizziness and fainting.  You get a rash or develop hives.  You have a decrease in urine output.  Your urine turns a dark color or changes to pink, red, or brown. Any of the following  symptoms occur over the next 10 days:  You have a temperature by mouth above 102 F (38.9 C), not controlled by medicine.  Shortness of breath.  Weakness after normal activity.  The white part of the eye turns yellow (jaundice).  You have a decrease in the amount of urine or are urinating less often.  Your  urine turns a dark color or changes to pink, red, or brown. Document Released: 05/20/2000 Document Revised: 08/15/2011 Document Reviewed: 01/07/2008 [facility] Patient Information 2013 [place].

## 2012-03-16 LAB — TYPE AND SCREEN
Antibody Screen: NEGATIVE
Unit division: 0

## 2012-03-20 ENCOUNTER — Ambulatory Visit (HOSPITAL_BASED_OUTPATIENT_CLINIC_OR_DEPARTMENT_OTHER): Payer: Medicare Other | Admitting: Internal Medicine

## 2012-03-20 ENCOUNTER — Telehealth: Payer: Self-pay | Admitting: Internal Medicine

## 2012-03-20 VITALS — BP 111/63 | HR 79 | Temp 98.6°F | Resp 20 | Ht <= 58 in | Wt 76.6 lb

## 2012-03-20 DIAGNOSIS — K5090 Crohn's disease, unspecified, without complications: Secondary | ICD-10-CM

## 2012-03-20 DIAGNOSIS — K912 Postsurgical malabsorption, not elsewhere classified: Secondary | ICD-10-CM

## 2012-03-20 DIAGNOSIS — D509 Iron deficiency anemia, unspecified: Secondary | ICD-10-CM

## 2012-03-20 NOTE — Patient Instructions (Signed)
Your hemoglobin and hematocrit were low recently. You felt better after the 2 units of packed rbc's transfusion. Will arrange for Feraheme infusion tomorrow and in one week. Followup in 3 months

## 2012-03-20 NOTE — Progress Notes (Signed)
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   PRIOR THERAPY: 1)  PRBCs transfusion as needed last was given few days ago.  CURRENT THERAPY:  Intravenous Feraheme infusion on as-needed basis. Last dose was given on 12/20/2011, and the patient is scheduled for another treatment tomorrow.   INTERVAL HISTORY: Kim [name] 71 y.o. female returns to the clinic today for followup visit. The patient has been complaining recently of increasing fatigue and weakness. She had a recent CBC, iron study and ferritin performed which showed decrease in the hemoglobin down to 7.5 and hematocrit 24.6%. I arranged for the patient transfusion of 2 units of packed rbc's which was given few days ago. She is here today for evaluation and discussion of her lab work and recommendation regarding her persistent anemia. She is a little bit better today but continues to have fatigue with exertion. She denied having any significant chest pain, shortness breath, cough or hemoptysis. She denied having any significant weight loss but has occasional night sweats.  MEDICAL HISTORY: Past Medical History  Diagnosis Date  . Osteoporosis     managed by Dr. Nevada Crane at [facility], [place] q2 yrs  . OA (osteoarthritis)     hands,back  . Crohn disease     Dr. Emelda Fear at [facility]  . Glaucoma     Dr. Janyth Contes  . Intestinal malabsorption   . Anemia     related to malabsorption--gets B12 shots and iron infusions, managed by Dr. Julien Nordmann    ALLERGIES:  is allergic to sulfa antibiotics.  MEDICATIONS:  Current Outpatient Prescriptions  Medication Sig Dispense Refill  . Adalimumab (HUMIRA [place]) Inject into the skin once a week.       Marland Kitchen alendronate (FOSAMAX) 35 MG tablet Take 35 mg by mouth every 7 (seven) days.  Take with a full glass of water on an empty stomach.       . B Complex-C (B-COMPLEX WITH VITAMIN C) tablet Take 1 tablet by mouth daily.      . Calcium Carbonate-Vitamin D (CALCIUM + D PO) Take 4 tablets by mouth daily. Equates to 1510m of Ca+ and 1,000IU of vitamin D      . dorzolamide-timolol (COSOPT) 22.3-6.8 MG/ML ophthalmic solution 1 drop daily.        . Flaxseed, Linseed, (FLAX SEED OIL) 1000 MG CAPS Take 1 capsule by mouth daily.      . Glucosamine-Chondroit-Vit C-Mn (GLUCOSAMINE 1500 COMPLEX PO) Take by mouth daily.        . hydrOXYzine (ATARAX/VISTARIL) 25 MG tablet Take 25 mg by mouth as needed.        . Multiple Vitamin (MULTIVITAMIN) tablet Take 1 tablet by mouth daily.        . Omega-3 Fatty Acids (FISH OIL) 1200 MG CAPS Take 1 capsule by mouth daily.      .Marland KitchenDISCONTD: potassium chloride 40 MEQ/15ML (20%) LIQD Take 15 mLs (40 mEq total) by mouth as directed.  150 mL  0    SURGICAL HISTORY:  Past Surgical History  Procedure Date  . Total colectomy age 89 . Internal ostomy pouch 1994    REVIEW OF SYSTEMS:  A comprehensive review of systems was negative except for: Constitutional: positive for fatigue   PHYSICAL EXAMINATION: General appearance: alert, cooperative and no distress Neck:  no adenopathy Lymph nodes: Cervical, supraclavicular, and axillary nodes normal. Resp: clear to auscultation bilaterally Cardio: regular rate and rhythm, S1, S2 normal, no murmur, click, rub or gallop GI: soft, non-tender; bowel sounds normal; no masses,  no organomegaly Extremities: extremities normal, atraumatic, no cyanosis or edema  ECOG PERFORMANCE STATUS: 1 - Symptomatic but completely ambulatory  Blood pressure 111/63, pulse 79, temperature 98.6 F (37 C), temperature source Oral, resp. rate 20, height 4' 9"  (1.448 m), weight 76 lb 9.6 oz (34.746 kg).  LABORATORY DATA: Lab Results  Component Value Date   WBC 4.9 03/13/2012   HGB 7.5* 03/13/2012   HCT 24.6* 03/13/2012   MCV 90.1  03/13/2012   PLT Clumped Platelets--Appears Adequate 03/13/2012      Chemistry      Component Value Date/Time   NA 136 09/05/2011 1558   K 3.0* 09/05/2011 1558   CL 101 09/05/2011 1558   CO2 27 09/05/2011 1558   BUN 34* 09/05/2011 1558   CREATININE 1.13* 09/05/2011 1558   CREATININE 1.04 08/22/2011 1106      Component Value Date/Time   CALCIUM 8.8 09/05/2011 1558   ALKPHOS 44 09/05/2011 1558   AST 16 09/05/2011 1558   ALT 11 09/05/2011 1558   BILITOT 0.2* 09/05/2011 1558       RADIOGRAPHIC STUDIES: No results found.  ASSESSMENT: This is a very pleasant 71 years old white female with history of iron deficiency anemia secondary to blood loss as well as malabsorption. The patient feels a little bit better after the blood transfusion. Her iron study showed decreased iron level but her ferritin level is elevated which most likely secondary to acute phase reaction from her an inflammatory bowel disease.  PLAN: I have a lengthy discussion with the patient today about her condition. I recommended for her to proceed with Feraheme infusion x2 doses 1 tomorrow and the second one in one week. I would see her back for followup visit in 3 months for reevaluation with repeat CBC, and iron study. I will also arrange for the patient to have repeat CBC and iron study in 6 weeks before her visit to [place] for adjustment of her ostomy. She was advised to call immediately if she has any concerning symptoms in the interval.  All questions were answered. The patient knows to call the clinic with any problems, questions or concerns. We can certainly see the patient much sooner if necessary.

## 2012-03-20 NOTE — Telephone Encounter (Signed)
gv and printed schedule for OCT...the patient did not want to finish scheduling due to having work schedule....wanted to wait till she had work calander.

## 2012-03-21 ENCOUNTER — Telehealth: Payer: Self-pay | Admitting: Internal Medicine

## 2012-03-21 ENCOUNTER — Ambulatory Visit (HOSPITAL_BASED_OUTPATIENT_CLINIC_OR_DEPARTMENT_OTHER): Payer: Medicare Other

## 2012-03-21 ENCOUNTER — Other Ambulatory Visit (HOSPITAL_BASED_OUTPATIENT_CLINIC_OR_DEPARTMENT_OTHER): Payer: Medicare Other | Admitting: Lab

## 2012-03-21 VITALS — BP 112/41 | HR 71 | Temp 97.2°F

## 2012-03-21 DIAGNOSIS — D509 Iron deficiency anemia, unspecified: Secondary | ICD-10-CM

## 2012-03-21 LAB — CBC WITH DIFFERENTIAL/PLATELET
BASO%: 0.5 % (ref 0.0–2.0)
Eosinophils Absolute: 0.1 10*3/uL (ref 0.0–0.5)
MONO#: 0.5 10*3/uL (ref 0.1–0.9)
MONO%: 9.5 % (ref 0.0–14.0)
NEUT#: 4.4 10*3/uL (ref 1.5–6.5)
RBC: 3.87 10*6/uL (ref 3.70–5.45)
RDW: 15.8 % — ABNORMAL HIGH (ref 11.2–14.5)
WBC: 5.6 10*3/uL (ref 3.9–10.3)

## 2012-03-21 LAB — TECHNOLOGIST REVIEW

## 2012-03-21 MED ORDER — FERUMOXYTOL INJECTION 510 MG/17 ML
510.0000 mg | Freq: Once | INTRAVENOUS | Status: AC
  Administered 2012-03-21: 510 mg via INTRAVENOUS
  Filled 2012-03-21: qty 17

## 2012-03-21 NOTE — Telephone Encounter (Signed)
Printed and scheduled pt appts for Oct thru Jan..Marland KitchenMarland Kitchen

## 2012-03-21 NOTE — Patient Instructions (Signed)
Today you received Faraheme.  Please call for any problems.  [phone]

## 2012-03-21 NOTE — Telephone Encounter (Signed)
Pt scheduled for blood transfusion

## 2012-03-26 ENCOUNTER — Telehealth: Payer: Self-pay | Admitting: Family Medicine

## 2012-03-26 NOTE — Telephone Encounter (Signed)
Pt called to discuss the referral.

## 2012-03-26 NOTE — Telephone Encounter (Signed)
Called patient and spoke with her.

## 2012-03-28 ENCOUNTER — Ambulatory Visit (HOSPITAL_BASED_OUTPATIENT_CLINIC_OR_DEPARTMENT_OTHER): Payer: Medicare Other

## 2012-03-28 ENCOUNTER — Other Ambulatory Visit: Payer: Medicare Other | Admitting: Lab

## 2012-03-28 VITALS — BP 103/61 | HR 81 | Temp 98.3°F | Resp 18

## 2012-03-28 DIAGNOSIS — D509 Iron deficiency anemia, unspecified: Secondary | ICD-10-CM

## 2012-03-28 MED ORDER — SODIUM CHLORIDE 0.9 % IV SOLN: Freq: Once | INTRAVENOUS | Status: DC

## 2012-03-28 MED ORDER — FERUMOXYTOL INJECTION 510 MG/17 ML
510.0000 mg | Freq: Once | INTRAVENOUS | Status: AC
  Administered 2012-03-28: 510 mg via INTRAVENOUS
  Filled 2012-03-28: qty 17

## 2012-03-28 NOTE — Patient Instructions (Signed)
Today you received Faraheme.  Please call if problems.

## 2012-03-29 ENCOUNTER — Other Ambulatory Visit: Payer: Self-pay | Admitting: Internal Medicine

## 2012-03-29 DIAGNOSIS — D509 Iron deficiency anemia, unspecified: Secondary | ICD-10-CM

## 2012-04-05 ENCOUNTER — Other Ambulatory Visit (INDEPENDENT_AMBULATORY_CARE_PROVIDER_SITE_OTHER): Payer: Medicare Other

## 2012-04-05 DIAGNOSIS — E538 Deficiency of other specified B group vitamins: Secondary | ICD-10-CM

## 2012-04-05 MED ORDER — CYANOCOBALAMIN 1000 MCG/ML IJ SOLN
1000.0000 ug | Freq: Once | INTRAMUSCULAR | Status: AC
  Administered 2012-04-05: 1000 ug via INTRAMUSCULAR

## 2012-04-11 ENCOUNTER — Telehealth: Payer: Self-pay | Admitting: *Deleted

## 2012-04-11 ENCOUNTER — Ambulatory Visit (HOSPITAL_BASED_OUTPATIENT_CLINIC_OR_DEPARTMENT_OTHER): Payer: Medicare Other

## 2012-04-11 ENCOUNTER — Other Ambulatory Visit: Payer: Self-pay | Admitting: *Deleted

## 2012-04-11 ENCOUNTER — Encounter (HOSPITAL_COMMUNITY)
Admission: RE | Admit: 2012-04-11 | Discharge: 2012-04-11 | Disposition: A | Discharge status: Home / Self Care | Payer: Medicare Other | Source: Ambulatory Visit | Attending: Internal Medicine | Admitting: Internal Medicine

## 2012-04-11 ENCOUNTER — Telehealth: Payer: Self-pay | Admitting: Internal Medicine

## 2012-04-11 DIAGNOSIS — D509 Iron deficiency anemia, unspecified: Secondary | ICD-10-CM

## 2012-04-11 DIAGNOSIS — D649 Anemia, unspecified: Secondary | ICD-10-CM

## 2012-04-11 LAB — CBC & DIFF AND RETIC
BASO%: 0.3 % (ref 0.0–2.0)
Eosinophils Absolute: 0.1 10*3/uL (ref 0.0–0.5)
Immature Retic Fract: 24.5 % — ABNORMAL HIGH (ref 1.60–10.00)
LYMPH%: 13.3 % — ABNORMAL LOW (ref 14.0–49.7)
MCHC: 30.4 g/dL — ABNORMAL LOW (ref 31.5–36.0)
MONO#: 0.7 10*3/uL (ref 0.1–0.9)
MONO%: 11.4 % (ref 0.0–14.0)
NEUT#: 4.8 10*3/uL (ref 1.5–6.5)
Platelets: ADEQUATE 10*3/uL (ref 145–400)
RBC: 2.33 10*6/uL — ABNORMAL LOW (ref 3.70–5.45)
RDW: 17.1 % — ABNORMAL HIGH (ref 11.2–14.5)
Retic %: 4.01 % — ABNORMAL HIGH (ref 0.70–2.10)
WBC: 6.5 10*3/uL (ref 3.9–10.3)
nRBC: 0 % (ref 0–0)

## 2012-04-11 LAB — HOLD TUBE, BLOOD BANK

## 2012-04-11 NOTE — Telephone Encounter (Signed)
appts made and pt called for lab today

## 2012-04-11 NOTE — Progress Notes (Signed)
Quick Note:  Call patient with the result and arrange for 2 units of PRBCs or send to ED. ______

## 2012-04-11 NOTE — Progress Notes (Signed)
Hbg 6.5, per Dr Vista Mink, pt needs 2 units PRBC's, left msg on pt vm with appts.  SLJ

## 2012-04-11 NOTE — Telephone Encounter (Signed)
Pt called stating she feels that she needs to have her lab work drawn again, she has noticed increased blood in her internal ostomy and her energy level is very low.  She wants to have the retic count and iron study to be done as well as a CBC.  Per Dr Vista Mink, okay for pt to have lab work done.  Onc tx schedule filled out.  SLJ

## 2012-04-12 ENCOUNTER — Ambulatory Visit (HOSPITAL_BASED_OUTPATIENT_CLINIC_OR_DEPARTMENT_OTHER): Payer: Medicare Other

## 2012-04-12 ENCOUNTER — Telehealth: Payer: Self-pay | Admitting: *Deleted

## 2012-04-12 VITALS — BP 112/58 | HR 66 | Temp 97.4°F | Resp 20

## 2012-04-12 DIAGNOSIS — D649 Anemia, unspecified: Secondary | ICD-10-CM

## 2012-04-12 DIAGNOSIS — D509 Iron deficiency anemia, unspecified: Secondary | ICD-10-CM

## 2012-04-12 MED ORDER — DIPHENHYDRAMINE HCL 50 MG/ML IJ SOLN
25.0000 mg | Freq: Once | INTRAMUSCULAR | Status: AC
  Administered 2012-04-12: 25 mg via INTRAVENOUS

## 2012-04-12 MED ORDER — ACETAMINOPHEN 325 MG PO TABS
650.0000 mg | ORAL_TABLET | Freq: Once | ORAL | Status: AC
  Administered 2012-04-12: 650 mg via ORAL

## 2012-04-12 NOTE — Telephone Encounter (Signed)
units PRBCs. Pt prefers 1 unit Thursday afternoon and 1 unit Friday. Pt has already been typed and crossmathched. Please let RN know the time. SLJ  Looked at the order the transfusion has all ready been scheduled

## 2012-04-12 NOTE — Patient Instructions (Signed)
Blood Transfusion Information WHAT IS A BLOOD TRANSFUSION? A transfusion is the replacement of blood or some of its parts. Blood is made up of multiple cells which provide different functions.  Red blood cells carry oxygen and are used for blood loss replacement.  White blood cells fight against infection.  Platelets control bleeding.  Plasma helps clot blood.  Other blood products are available for specialized needs, such as hemophilia or other clotting disorders. BEFORE THE TRANSFUSION  Who gives blood for transfusions?   You may be able to donate blood to be used at a later date on yourself (autologous donation).  Relatives can be asked to donate blood. This is generally not any safer than if you have received blood from a stranger. The same precautions are taken to ensure safety when a relative's blood is donated.  Healthy volunteers who are fully evaluated to make sure their blood is safe. This is blood bank blood. Transfusion therapy is the safest it has ever been in the practice of medicine. Before blood is taken from a donor, a complete history is taken to make sure that person has no history of diseases nor engages in risky social behavior (examples are intravenous drug use or sexual activity with multiple partners). The donor's travel history is screened to minimize risk of transmitting infections, such as malaria. The donated blood is tested for signs of infectious diseases, such as HIV and hepatitis. The blood is then tested to be sure it is compatible with you in order to minimize the chance of a transfusion reaction. If you or a relative donates blood, this is often done in anticipation of surgery and is not appropriate for emergency situations. It takes many days to process the donated blood. RISKS AND COMPLICATIONS Although transfusion therapy is very safe and saves many lives, the main dangers of transfusion include:   Getting an infectious disease.  Developing a  transfusion reaction. This is an allergic reaction to something in the blood you were given. Every precaution is taken to prevent this. The decision to have a blood transfusion has been considered carefully by your caregiver before blood is given. Blood is not given unless the benefits outweigh the risks. AFTER THE TRANSFUSION  Right after receiving a blood transfusion, you will usually feel much better and more energetic. This is especially true if your red blood cells have gotten low (anemic). The transfusion raises the level of the red blood cells which carry oxygen, and this usually causes an energy increase.  The nurse administering the transfusion will monitor you carefully for complications. HOME CARE INSTRUCTIONS  No special instructions are needed after a transfusion. You may find your energy is better. Speak with your caregiver about any limitations on activity for underlying diseases you may have. SEEK MEDICAL CARE IF:   Your condition is not improving after your transfusion.  You develop redness or irritation at the intravenous (IV) site. SEEK IMMEDIATE MEDICAL CARE IF:  Any of the following symptoms occur over the next 12 hours:  Shaking chills.  You have a temperature by mouth above 102 F (38.9 C), not controlled by medicine.  Chest, back, or muscle pain.  People around you feel you are not acting correctly or are confused.  Shortness of breath or difficulty breathing.  Dizziness and fainting.  You get a rash or develop hives.  You have a decrease in urine output.  Your urine turns a dark color or changes to pink, red, or brown. Any of the following  symptoms occur over the next 10 days:  You have a temperature by mouth above 102 F (38.9 C), not controlled by medicine.  Shortness of breath.  Weakness after normal activity.  The white part of the eye turns yellow (jaundice).  You have a decrease in the amount of urine or are urinating less often.  Your  urine turns a dark color or changes to pink, red, or brown. Document Released: 05/20/2000 Document Revised: 08/15/2011 Document Reviewed: 01/07/2008 [facility] Patient Information 2013 [place].

## 2012-04-13 ENCOUNTER — Other Ambulatory Visit: Payer: Self-pay | Admitting: *Deleted

## 2012-04-13 ENCOUNTER — Other Ambulatory Visit: Payer: Self-pay | Admitting: Oncology

## 2012-04-13 DIAGNOSIS — D649 Anemia, unspecified: Secondary | ICD-10-CM

## 2012-04-13 LAB — IRON AND TIBC
%SAT: 33 % (ref 20–55)
TIBC: 146 ug/dL — ABNORMAL LOW (ref 250–470)
UIBC: 98 ug/dL — ABNORMAL LOW (ref 125–400)

## 2012-04-14 ENCOUNTER — Ambulatory Visit (HOSPITAL_BASED_OUTPATIENT_CLINIC_OR_DEPARTMENT_OTHER): Payer: Medicare Other

## 2012-04-14 VITALS — BP 104/50 | HR 55 | Temp 97.4°F | Resp 16

## 2012-04-14 DIAGNOSIS — D649 Anemia, unspecified: Secondary | ICD-10-CM

## 2012-04-14 MED ORDER — DIPHENHYDRAMINE HCL 50 MG/ML IJ SOLN
25.0000 mg | Freq: Once | INTRAMUSCULAR | Status: AC
  Administered 2012-04-14: 25 mg via INTRAVENOUS

## 2012-04-14 MED ORDER — SODIUM CHLORIDE 0.9 % IV SOLN
250.0000 mL | Freq: Once | INTRAVENOUS | Status: AC
  Administered 2012-04-14: 250 mL via INTRAVENOUS

## 2012-04-14 MED ORDER — ACETAMINOPHEN 325 MG PO TABS
650.0000 mg | ORAL_TABLET | Freq: Once | ORAL | Status: AC
  Administered 2012-04-14: 650 mg via ORAL

## 2012-04-14 NOTE — Patient Instructions (Signed)
Blood Transfusion Information WHAT IS A BLOOD TRANSFUSION? A transfusion is the replacement of blood or some of its parts. Blood is made up of multiple cells which provide different functions.  Red blood cells carry oxygen and are used for blood loss replacement.  White blood cells fight against infection.  Platelets control bleeding.  Plasma helps clot blood.  Other blood products are available for specialized needs, such as hemophilia or other clotting disorders. BEFORE THE TRANSFUSION  Who gives blood for transfusions?   You may be able to donate blood to be used at a later date on yourself (autologous donation).  Relatives can be asked to donate blood. This is generally not any safer than if you have received blood from a stranger. The same precautions are taken to ensure safety when a relative's blood is donated.  Healthy volunteers who are fully evaluated to make sure their blood is safe. This is blood bank blood. Transfusion therapy is the safest it has ever been in the practice of medicine. Before blood is taken from a donor, a complete history is taken to make sure that person has no history of diseases nor engages in risky social behavior (examples are intravenous drug use or sexual activity with multiple partners). The donor's travel history is screened to minimize risk of transmitting infections, such as malaria. The donated blood is tested for signs of infectious diseases, such as HIV and hepatitis. The blood is then tested to be sure it is compatible with you in order to minimize the chance of a transfusion reaction. If you or a relative donates blood, this is often done in anticipation of surgery and is not appropriate for emergency situations. It takes many days to process the donated blood. RISKS AND COMPLICATIONS Although transfusion therapy is very safe and saves many lives, the main dangers of transfusion include:   Getting an infectious disease.  Developing a  transfusion reaction. This is an allergic reaction to something in the blood you were given. Every precaution is taken to prevent this. The decision to have a blood transfusion has been considered carefully by your caregiver before blood is given. Blood is not given unless the benefits outweigh the risks. AFTER THE TRANSFUSION  Right after receiving a blood transfusion, you will usually feel much better and more energetic. This is especially true if your red blood cells have gotten low (anemic). The transfusion raises the level of the red blood cells which carry oxygen, and this usually causes an energy increase.  The nurse administering the transfusion will monitor you carefully for complications. HOME CARE INSTRUCTIONS  No special instructions are needed after a transfusion. You may find your energy is better. Speak with your caregiver about any limitations on activity for underlying diseases you may have. SEEK MEDICAL CARE IF:   Your condition is not improving after your transfusion.  You develop redness or irritation at the intravenous (IV) site. SEEK IMMEDIATE MEDICAL CARE IF:  Any of the following symptoms occur over the next 12 hours:  Shaking chills.  You have a temperature by mouth above 102 F (38.9 C), not controlled by medicine.  Chest, back, or muscle pain.  People around you feel you are not acting correctly or are confused.  Shortness of breath or difficulty breathing.  Dizziness and fainting.  You get a rash or develop hives.  You have a decrease in urine output.  Your urine turns a dark color or changes to pink, red, or brown. Any of the following  symptoms occur over the next 10 days:  You have a temperature by mouth above 102 F (38.9 C), not controlled by medicine.  Shortness of breath.  Weakness after normal activity.  The white part of the eye turns yellow (jaundice).  You have a decrease in the amount of urine or are urinating less often.  Your  urine turns a dark color or changes to pink, red, or brown. Document Released: 05/20/2000 Document Revised: 08/15/2011 Document Reviewed: 01/07/2008 [facility] Patient Information 2013 [place].

## 2012-04-16 LAB — TYPE AND SCREEN
ABO/RH(D): AB POS
Antibody Screen: NEGATIVE
Unit division: 0
Unit division: 0

## 2012-04-26 ENCOUNTER — Other Ambulatory Visit (HOSPITAL_BASED_OUTPATIENT_CLINIC_OR_DEPARTMENT_OTHER): Payer: Medicare Other | Admitting: Lab

## 2012-04-26 DIAGNOSIS — D539 Nutritional anemia, unspecified: Secondary | ICD-10-CM

## 2012-04-26 DIAGNOSIS — D509 Iron deficiency anemia, unspecified: Secondary | ICD-10-CM

## 2012-04-26 LAB — CBC WITH DIFFERENTIAL/PLATELET
BASO%: 0.2 % (ref 0.0–2.0)
EOS%: 1.4 % (ref 0.0–7.0)
Eosinophils Absolute: 0.1 10*3/uL (ref 0.0–0.5)
MCHC: 33.8 g/dL (ref 31.5–36.0)
MCV: 92.3 fL (ref 79.5–101.0)
MONO%: 11.6 % (ref 0.0–14.0)
NEUT#: 7.3 10*3/uL — ABNORMAL HIGH (ref 1.5–6.5)
RBC: 3.38 10*6/uL — ABNORMAL LOW (ref 3.70–5.45)
RDW: 15.8 % — ABNORMAL HIGH (ref 11.2–14.5)

## 2012-04-29 LAB — FERRITIN: Ferritin: 1575 ng/mL — ABNORMAL HIGH (ref 10–291)

## 2012-04-29 LAB — IRON AND TIBC
Iron: 36 ug/dL — ABNORMAL LOW (ref 42–145)
UIBC: 143 ug/dL (ref 125–400)

## 2012-05-22 ENCOUNTER — Telehealth: Payer: Self-pay | Admitting: *Deleted

## 2012-05-22 NOTE — Telephone Encounter (Signed)
Received call from [place] at [facility] that pt will be seeing Dr Terance Hart at this clinic in [facility].  They are requesting most recent progress note and initial consultation to be faxed to [phone].  Info faxed.  SLJ

## 2012-05-24 ENCOUNTER — Telehealth: Payer: Self-pay | Admitting: *Deleted

## 2012-05-24 NOTE — Telephone Encounter (Signed)
Dr Terance Hart with [facility] dictated a letter regarding pt's hospitalization in [place].  Letter given to Dr Vista Mink to review.  SLJ

## 2012-05-28 ENCOUNTER — Telehealth: Payer: Self-pay | Admitting: *Deleted

## 2012-05-28 ENCOUNTER — Other Ambulatory Visit: Payer: Self-pay | Admitting: *Deleted

## 2012-05-28 ENCOUNTER — Ambulatory Visit: Payer: Medicare Other

## 2012-05-28 NOTE — Telephone Encounter (Signed)
Pt called stating she would like to change her lab appointment from 06/07/12 to 06/04/12. She feels her Hgb needs to be checked sooner.

## 2012-05-29 ENCOUNTER — Telehealth: Payer: Self-pay | Admitting: Internal Medicine

## 2012-05-29 NOTE — Telephone Encounter (Signed)
called pt and lm with nerw lab time     anne

## 2012-06-01 ENCOUNTER — Other Ambulatory Visit (HOSPITAL_BASED_OUTPATIENT_CLINIC_OR_DEPARTMENT_OTHER): Payer: Medicare Other | Admitting: Lab

## 2012-06-01 DIAGNOSIS — D509 Iron deficiency anemia, unspecified: Secondary | ICD-10-CM

## 2012-06-01 LAB — CBC & DIFF AND RETIC
EOS%: 2.3 % (ref 0.0–7.0)
Eosinophils Absolute: 0.1 10*3/uL (ref 0.0–0.5)
LYMPH%: 17.3 % (ref 14.0–49.7)
MCH: 28.9 pg (ref 25.1–34.0)
MCHC: 31.1 g/dL — ABNORMAL LOW (ref 31.5–36.0)
MCV: 93 fL (ref 79.5–101.0)
MONO%: 9 % (ref 0.0–14.0)
NEUT#: 2.8 10*3/uL (ref 1.5–6.5)
Platelets: 241 10*3/uL (ref 145–400)
RBC: 2.84 10*6/uL — ABNORMAL LOW (ref 3.70–5.45)
RDW: 16.5 % — ABNORMAL HIGH (ref 11.2–14.5)
Retic %: 4.41 % — ABNORMAL HIGH (ref 0.70–2.10)
Retic Ct Abs: 125.24 10*3/uL — ABNORMAL HIGH (ref 33.70–90.70)

## 2012-06-01 LAB — IRON AND TIBC
%SAT: 31 % (ref 20–55)
Iron: 57 ug/dL (ref 42–145)
TIBC: 185 ug/dL — ABNORMAL LOW (ref 250–470)
UIBC: 128 ug/dL (ref 125–400)

## 2012-06-01 LAB — FOLATE: Folate: >20 ng/mL

## 2012-06-01 LAB — VITAMIN B12: Vitamin B-12: 452 pg/mL (ref 211–911)

## 2012-06-07 ENCOUNTER — Ambulatory Visit (HOSPITAL_BASED_OUTPATIENT_CLINIC_OR_DEPARTMENT_OTHER): Payer: Medicare Other | Admitting: Internal Medicine

## 2012-06-07 ENCOUNTER — Encounter (HOSPITAL_COMMUNITY)
Admission: RE | Admit: 2012-06-07 | Discharge: 2012-06-07 | Disposition: A | Discharge status: Home / Self Care | Payer: Medicare Other | Source: Ambulatory Visit | Attending: Internal Medicine | Admitting: Internal Medicine

## 2012-06-07 ENCOUNTER — Other Ambulatory Visit: Payer: Medicare Other | Admitting: Lab

## 2012-06-07 ENCOUNTER — Telehealth: Payer: Self-pay | Admitting: *Deleted

## 2012-06-07 ENCOUNTER — Encounter: Payer: Self-pay | Admitting: Internal Medicine

## 2012-06-07 VITALS — BP 110/55 | HR 79 | Temp 97.4°F | Resp 18 | Ht <= 58 in | Wt 72.3 lb

## 2012-06-07 DIAGNOSIS — D509 Iron deficiency anemia, unspecified: Secondary | ICD-10-CM

## 2012-06-07 DIAGNOSIS — D638 Anemia in other chronic diseases classified elsewhere: Secondary | ICD-10-CM

## 2012-06-07 DIAGNOSIS — D649 Anemia, unspecified: Secondary | ICD-10-CM | POA: Insufficient documentation

## 2012-06-07 NOTE — Telephone Encounter (Signed)
Per staff phone call I have scheduled appt for tomorrow.  JMW

## 2012-06-07 NOTE — Progress Notes (Signed)
[employer] Telephone:([phone]   Fax:([phone]) [phone]  OFFICE PROGRESS NOTE  KNAPP,EVE A, MD 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   PRIOR THERAPY:  1) PRBCs transfusion as needed last was given few days ago.   CURRENT THERAPY: Intravenous Feraheme infusion on as-needed basis. Last dose was given on 12/20/2011, and the patient is scheduled for another treatment tomorrow.  INTERVAL HISTORY: Kim [name] 72 y.o. female returns to the clinic today for routine followup visit. The patient has been complaining of increasing fatigue and weakness recently. She had revision of her BCIR are on 05/15/2012. During her hospitalization in [place] her hemoglobin fell down to 7.5 g./dL. She received 1 unit of packed rbc's transfusion at that time. The patient came back to [facility] and she continues to feel tired and fatigued most of the time. She had repeat CBC and iron study recently and she is here today for evaluation and discussion of her lab results and treatment of her condition.  MEDICAL HISTORY: Past Medical History  Diagnosis Date  . Osteoporosis     managed by Dr. [place] Crane at [facility], [place] q2 yrs  . OA (osteoarthritis)     hands,back  . Crohn disease     Dr. Emelda Fear at [facility]  . Glaucoma     Dr. Janyth Contes  . Intestinal malabsorption   . Anemia     related to malabsorption--gets B12 shots and iron infusions, managed by Dr. Julien Nordmann    ALLERGIES:  is allergic to sulfa antibiotics.  MEDICATIONS:  Current Outpatient Prescriptions  Medication Sig Dispense Refill  . Adalimumab (HUMIRA [place]) Inject into the skin once a week.       Marland Kitchen alendronate (FOSAMAX) 35 MG tablet Take 35 mg by mouth every 7 (seven) days. Take with a full glass of water on an empty stomach.       . B Complex-C (B-COMPLEX WITH VITAMIN C) tablet Take 1 tablet by mouth daily.      .  Calcium Carbonate-Vitamin D (CALCIUM + D PO) Take 4 tablets by mouth daily. Equates to 1531m of Ca+ and 1,000IU of vitamin D      . dorzolamide-timolol (COSOPT) 22.3-6.8 MG/ML ophthalmic solution 1 drop daily.        . Flaxseed, Linseed, (FLAX SEED OIL) 1000 MG CAPS Take 1 capsule by mouth daily.      . Glucosamine-Chondroit-Vit C-Mn (GLUCOSAMINE 1500 COMPLEX PO) Take by mouth daily.        . hydrOXYzine (ATARAX/VISTARIL) 25 MG tablet Take 25 mg by mouth as needed.        . Multiple Vitamin (MULTIVITAMIN) tablet Take 1 tablet by mouth daily.        . Omega-3 Fatty Acids (FISH OIL) 1200 MG CAPS Take 1 capsule by mouth daily.      . [DISCONTINUED] potassium chloride 40 MEQ/15ML (20%) LIQD Take 15 mLs (40 mEq total) by mouth as directed.  150 mL  0    SURGICAL HISTORY:  Past Surgical History  Procedure Date  . Total colectomy age 72 . Internal ostomy pouch 1994    REVIEW OF SYSTEMS:  A comprehensive review of systems was negative except for: Constitutional: positive for fatigue   PHYSICAL EXAMINATION: General appearance: alert, cooperative, fatigued and no distress Head: Normocephalic, without obvious abnormality, atraumatic Neck: no adenopathy Lymph nodes: Cervical, supraclavicular, and axillary nodes normal. Resp: clear to  auscultation bilaterally Cardio: regular rate and rhythm, S1, S2 normal, no murmur, click, rub or gallop GI: soft, non-tender; bowel sounds normal; no masses,  no organomegaly Extremities: extremities normal, atraumatic, no cyanosis or edema Neurologic: Alert and oriented X 3, normal strength and tone. Normal symmetric reflexes. Normal coordination and gait  ECOG PERFORMANCE STATUS: 1 - Symptomatic but completely ambulatory  Blood pressure 110/55, pulse 79, temperature 97.4 F (36.3 C), temperature source Oral, resp. rate 18, height 4' 9"  (1.448 m), weight 72 lb 4.8 oz (32.795 kg).  LABORATORY DATA: Lab Results  Component Value Date   WBC 4.0 06/01/2012   HGB  8.2* 06/01/2012   HCT 26.4* 06/01/2012   MCV 93.0 06/01/2012   PLT 241 06/01/2012      Chemistry      Component Value Date/Time   NA 136 09/05/2011 1558   K 3.0* 09/05/2011 1558   CL 101 09/05/2011 1558   CO2 27 09/05/2011 1558   BUN 34* 09/05/2011 1558   CREATININE 1.13* 09/05/2011 1558   CREATININE 1.04 08/22/2011 1106      Component Value Date/Time   CALCIUM 8.8 09/05/2011 1558   ALKPHOS 44 09/05/2011 1558   AST 16 09/05/2011 1558   ALT 11 09/05/2011 1558   BILITOT 0.2* 09/05/2011 1558       RADIOGRAPHIC STUDIES: No results found.  ASSESSMENT: This is a very pleasant 72 years old white female with history of iron deficiency anemia as well as anemia of chronic disease. Her hemoglobin and hematocrit as always been on the low side. Her iron study recently is unremarkable for any iron deficiency. The patient is currently symptomatic from her anemia.  PLAN: I have a lengthy discussion with the patient today about her condition and treatment options. I don't see a need to consider the patient for IV iron infusion at this point because of her normal iron study. The patient has symptomatic anemia and I would consider her for one unit of PRBCs transfusion. I also discussed with the patient consideration of treatment with erythrocyte stimulating factor like Aranesp. I discussed with her the adverse effect including increase risk for stroke or heart disease and the patient would like to consider the treatment if it is covered by her insurance. She would be treated with Aranesp 300 mcg subcutaneously every 3 weeks. First dose expected tomorrow. The patient would come back for followup visit in 3 months with repeat CBC and iron study. She was advised to call immediately if she has any concerning symptoms in the interval.   All questions were answered. The patient knows to call the clinic with any problems, questions or concerns. We can certainly see the patient much sooner if necessary.  I spent 20 minutes  counseling the patient face to face. The total time spent in the appointment was 30 minutes.

## 2012-06-08 ENCOUNTER — Other Ambulatory Visit: Payer: Medicare Other | Admitting: Lab

## 2012-06-08 ENCOUNTER — Other Ambulatory Visit: Payer: Self-pay | Admitting: Oncology

## 2012-06-08 ENCOUNTER — Ambulatory Visit (HOSPITAL_BASED_OUTPATIENT_CLINIC_OR_DEPARTMENT_OTHER): Payer: Medicare Other

## 2012-06-08 VITALS — BP 90/55 | HR 74 | Temp 96.9°F | Resp 18

## 2012-06-08 DIAGNOSIS — D509 Iron deficiency anemia, unspecified: Secondary | ICD-10-CM

## 2012-06-08 DIAGNOSIS — D638 Anemia in other chronic diseases classified elsewhere: Secondary | ICD-10-CM

## 2012-06-08 LAB — PREPARE RBC (CROSSMATCH)

## 2012-06-08 MED ORDER — DARBEPOETIN ALFA-POLYSORBATE 300 MCG/0.6ML IJ SOLN
300.0000 ug | Freq: Once | INTRAMUSCULAR | Status: AC
  Administered 2012-06-08: 300 ug via SUBCUTANEOUS
  Filled 2012-06-08: qty 0.6

## 2012-06-08 MED ORDER — ACETAMINOPHEN 325 MG PO TABS
650.0000 mg | ORAL_TABLET | Freq: Once | ORAL | Status: DC
Start: 2012-06-08 — End: 2012-06-08

## 2012-06-08 MED ORDER — SODIUM CHLORIDE 0.9 % IV SOLN
250.0000 mL | Freq: Once | INTRAVENOUS | Status: AC
  Administered 2012-06-08: 250 mL via INTRAVENOUS

## 2012-06-08 MED ORDER — DIPHENHYDRAMINE HCL 25 MG PO CAPS: 25.0000 mg | ORAL_CAPSULE | Freq: Once | ORAL | Status: DC

## 2012-06-08 NOTE — Progress Notes (Signed)
Aranesp injection given Right upper outer buttocks per patient request and ok per Trixie Rude, Pharmacist.

## 2012-06-08 NOTE — Patient Instructions (Signed)
Blood Transfusion Information WHAT IS A BLOOD TRANSFUSION? A transfusion is the replacement of blood or some of its parts. Blood is made up of multiple cells which provide different functions.  Red blood cells carry oxygen and are used for blood loss replacement.  White blood cells fight against infection.  Platelets control bleeding.  Plasma helps clot blood.  Other blood products are available for specialized needs, such as hemophilia or other clotting disorders. BEFORE THE TRANSFUSION  Who gives blood for transfusions?   You may be able to donate blood to be used at a later date on yourself (autologous donation).  Relatives can be asked to donate blood. This is generally not any safer than if you have received blood from a stranger. The same precautions are taken to ensure safety when a relative's blood is donated.  Healthy volunteers who are fully evaluated to make sure their blood is safe. This is blood bank blood. Transfusion therapy is the safest it has ever been in the practice of medicine. Before blood is taken from a donor, a complete history is taken to make sure that person has no history of diseases nor engages in risky social behavior (examples are intravenous drug use or sexual activity with multiple partners). The donor's travel history is screened to minimize risk of transmitting infections, such as malaria. The donated blood is tested for signs of infectious diseases, such as HIV and hepatitis. The blood is then tested to be sure it is compatible with you in order to minimize the chance of a transfusion reaction. If you or a relative donates blood, this is often done in anticipation of surgery and is not appropriate for emergency situations. It takes many days to process the donated blood. RISKS AND COMPLICATIONS Although transfusion therapy is very safe and saves many lives, the main dangers of transfusion include:   Getting an infectious disease.  Developing a  transfusion reaction. This is an allergic reaction to something in the blood you were given. Every precaution is taken to prevent this. The decision to have a blood transfusion has been considered carefully by your caregiver before blood is given. Blood is not given unless the benefits outweigh the risks. AFTER THE TRANSFUSION  Right after receiving a blood transfusion, you will usually feel much better and more energetic. This is especially true if your red blood cells have gotten low (anemic). The transfusion raises the level of the red blood cells which carry oxygen, and this usually causes an energy increase.  The nurse administering the transfusion will monitor you carefully for complications. HOME CARE INSTRUCTIONS  No special instructions are needed after a transfusion. You may find your energy is better. Speak with your caregiver about any limitations on activity for underlying diseases you may have. SEEK MEDICAL CARE IF:   Your condition is not improving after your transfusion.  You develop redness or irritation at the intravenous (IV) site. SEEK IMMEDIATE MEDICAL CARE IF:  Any of the following symptoms occur over the next 12 hours:  Shaking chills.  You have a temperature by mouth above 102 F (38.9 C), not controlled by medicine.  Chest, back, or muscle pain.  People around you feel you are not acting correctly or are confused.  Shortness of breath or difficulty breathing.  Dizziness and fainting.  You get a rash or develop hives.  You have a decrease in urine output.  Your urine turns a dark color or changes to pink, red, or brown. Any of the following  symptoms occur over the next 10 days:  You have a temperature by mouth above 102 F (38.9 C), not controlled by medicine.  Shortness of breath.  Weakness after normal activity.  The white part of the eye turns yellow (jaundice).  You have a decrease in the amount of urine or are urinating less often.  Your  urine turns a dark color or changes to pink, red, or brown. Document Released: 05/20/2000 Document Revised: 08/15/2011 Document Reviewed: 01/07/2008 [facility] Patient Information 2013 [place].

## 2012-06-09 NOTE — Patient Instructions (Signed)
Your hemoglobin and hematocrit are low today and you are currently symptomatic from the anemia. I will arrange for you to have one unit of packed rbc's transfusion tomorrow. We also discussed treatment with erythrocyte stimulating factor like Aranesp and he would like to proceed with that. Followup in 3 months

## 2012-06-12 LAB — TYPE AND SCREEN: Unit division: 0

## 2012-06-20 ENCOUNTER — Other Ambulatory Visit (INDEPENDENT_AMBULATORY_CARE_PROVIDER_SITE_OTHER): Payer: Medicare Other

## 2012-06-20 DIAGNOSIS — E538 Deficiency of other specified B group vitamins: Secondary | ICD-10-CM

## 2012-06-20 MED ORDER — CYANOCOBALAMIN 1000 MCG/ML IJ SOLN
1000.0000 ug | Freq: Once | INTRAMUSCULAR | Status: AC
  Administered 2012-06-20: 1000 ug via INTRAMUSCULAR

## 2012-06-26 ENCOUNTER — Ambulatory Visit
Admission: RE | Admit: 2012-06-26 | Discharge: 2012-06-26 | Disposition: A | Discharge status: Home / Self Care | Payer: Medicare Other | Source: Ambulatory Visit | Attending: Family Medicine | Admitting: Family Medicine

## 2012-06-26 DIAGNOSIS — Z1231 Encounter for screening mammogram for malignant neoplasm of breast: Secondary | ICD-10-CM

## 2012-06-26 LAB — HM MAMMOGRAPHY: HM Mammogram: NORMAL

## 2012-06-29 ENCOUNTER — Other Ambulatory Visit (HOSPITAL_BASED_OUTPATIENT_CLINIC_OR_DEPARTMENT_OTHER): Payer: Medicare Other | Admitting: Lab

## 2012-06-29 ENCOUNTER — Other Ambulatory Visit: Payer: Self-pay | Admitting: Emergency Medicine

## 2012-06-29 ENCOUNTER — Other Ambulatory Visit: Payer: Self-pay | Admitting: Medical Oncology

## 2012-06-29 ENCOUNTER — Ambulatory Visit (HOSPITAL_BASED_OUTPATIENT_CLINIC_OR_DEPARTMENT_OTHER): Payer: Medicare Other

## 2012-06-29 VITALS — BP 92/48 | HR 57 | Temp 98.2°F | Resp 16

## 2012-06-29 DIAGNOSIS — D638 Anemia in other chronic diseases classified elsewhere: Secondary | ICD-10-CM

## 2012-06-29 DIAGNOSIS — D509 Iron deficiency anemia, unspecified: Secondary | ICD-10-CM

## 2012-06-29 LAB — CBC WITH DIFFERENTIAL/PLATELET
Basophils Absolute: 0.1 10*3/uL (ref 0.0–0.1)
EOS%: 4.2 % (ref 0.0–7.0)
HCT: 28.8 % — ABNORMAL LOW (ref 34.8–46.6)
HGB: 8.8 g/dL — ABNORMAL LOW (ref 11.6–15.9)
MCH: 28.9 pg (ref 25.1–34.0)
MCV: 94.7 fL (ref 79.5–101.0)
MONO%: 13.5 % (ref 0.0–14.0)
NEUT%: 64.3 % (ref 38.4–76.8)
lymph#: 0.8 10*3/uL — ABNORMAL LOW (ref 0.9–3.3)

## 2012-06-29 MED ORDER — DARBEPOETIN ALFA-POLYSORBATE 300 MCG/0.6ML IJ SOLN
300.0000 ug | Freq: Once | INTRAMUSCULAR | Status: AC
  Administered 2012-06-29: 300 ug via SUBCUTANEOUS
  Filled 2012-06-29: qty 0.6

## 2012-06-29 NOTE — Progress Notes (Signed)
Ice pack applied to patient's right buttocks per patient's request prior to injection. Injection given in patient's right buttock per patient's request. Patient tolerated well. Patient denies any SOB or chest pains. States feeling quite well today.

## 2012-06-30 NOTE — Progress Notes (Signed)
Quick Note:  Call patient with the resultand arrange 1 unit of PRBCs if symptomatic ______

## 2012-07-02 ENCOUNTER — Telehealth: Payer: Self-pay | Admitting: Medical Oncology

## 2012-07-02 NOTE — Telephone Encounter (Signed)
Per Kim [name] -pt did not want blood transfusion with hgb 8.8

## 2012-07-02 NOTE — Telephone Encounter (Signed)
Message copied by Ardeen Garland on Mon Jul 02, 2012  8:55 AM ------      Message from: Curt Bears      Created: Sat Jun 30, 2012  4:47 PM       Call patient with the resultand arrange 1 unit of PRBCs if symptomatic

## 2012-07-03 ENCOUNTER — Telehealth: Payer: Self-pay | Admitting: Internal Medicine

## 2012-07-03 ENCOUNTER — Other Ambulatory Visit: Payer: Self-pay | Admitting: Medical Oncology

## 2012-07-03 ENCOUNTER — Telehealth: Payer: Self-pay | Admitting: *Deleted

## 2012-07-03 DIAGNOSIS — D649 Anemia, unspecified: Secondary | ICD-10-CM

## 2012-07-03 NOTE — Telephone Encounter (Signed)
Per staff phone call I have scheduled appts.  JMW

## 2012-07-03 NOTE — Telephone Encounter (Signed)
lmonvm adviisng the pt of her lab and blood tranfusion appt on wed@1 :45pm

## 2012-07-04 ENCOUNTER — Ambulatory Visit (HOSPITAL_BASED_OUTPATIENT_CLINIC_OR_DEPARTMENT_OTHER): Payer: Medicare Other

## 2012-07-04 ENCOUNTER — Telehealth: Payer: Self-pay | Admitting: *Deleted

## 2012-07-04 ENCOUNTER — Other Ambulatory Visit: Payer: Medicare Other | Admitting: Lab

## 2012-07-04 VITALS — BP 84/52 | HR 63 | Temp 97.2°F | Resp 20

## 2012-07-04 DIAGNOSIS — D649 Anemia, unspecified: Secondary | ICD-10-CM

## 2012-07-04 MED ORDER — DIPHENHYDRAMINE HCL 50 MG/ML IJ SOLN: 25.0000 mg | Freq: Once | INTRAMUSCULAR | Status: DC

## 2012-07-04 MED ORDER — ACETAMINOPHEN 325 MG PO TABS: 650.0000 mg | ORAL_TABLET | Freq: Once | ORAL | Status: DC

## 2012-07-04 MED ORDER — SODIUM CHLORIDE 0.9 % IV SOLN
250.0000 mL | Freq: Once | INTRAVENOUS | Status: AC
  Administered 2012-07-04: 250 mL via INTRAVENOUS

## 2012-07-04 NOTE — Patient Instructions (Addendum)
Blood Transfusion Information WHAT IS A BLOOD TRANSFUSION? A transfusion is the replacement of blood or some of its parts. Blood is made up of multiple cells which provide different functions.  Red blood cells carry oxygen and are used for blood loss replacement.  White blood cells fight against infection.  Platelets control bleeding.  Plasma helps clot blood.  Other blood products are available for specialized needs, such as hemophilia or other clotting disorders. BEFORE THE TRANSFUSION  Who gives blood for transfusions?   You may be able to donate blood to be used at a later date on yourself (autologous donation).  Relatives can be asked to donate blood. This is generally not any safer than if you have received blood from a stranger. The same precautions are taken to ensure safety when a relative's blood is donated.  Healthy volunteers who are fully evaluated to make sure their blood is safe. This is blood bank blood. Transfusion therapy is the safest it has ever been in the practice of medicine. Before blood is taken from a donor, a complete history is taken to make sure that person has no history of diseases nor engages in risky social behavior (examples are intravenous drug use or sexual activity with multiple partners). The donor's travel history is screened to minimize risk of transmitting infections, such as malaria. The donated blood is tested for signs of infectious diseases, such as HIV and hepatitis. The blood is then tested to be sure it is compatible with you in order to minimize the chance of a transfusion reaction. If you or a relative donates blood, this is often done in anticipation of surgery and is not appropriate for emergency situations. It takes many days to process the donated blood. RISKS AND COMPLICATIONS Although transfusion therapy is very safe and saves many lives, the main dangers of transfusion include:   Getting an infectious disease.  Developing a  transfusion reaction. This is an allergic reaction to something in the blood you were given. Every precaution is taken to prevent this. The decision to have a blood transfusion has been considered carefully by your caregiver before blood is given. Blood is not given unless the benefits outweigh the risks. AFTER THE TRANSFUSION  Right after receiving a blood transfusion, you will usually feel much better and more energetic. This is especially true if your red blood cells have gotten low (anemic). The transfusion raises the level of the red blood cells which carry oxygen, and this usually causes an energy increase.  The nurse administering the transfusion will monitor you carefully for complications. HOME CARE INSTRUCTIONS  No special instructions are needed after a transfusion. You may find your energy is better. Speak with your caregiver about any limitations on activity for underlying diseases you may have. SEEK MEDICAL CARE IF:   Your condition is not improving after your transfusion.  You develop redness or irritation at the intravenous (IV) site. SEEK IMMEDIATE MEDICAL CARE IF:  Any of the following symptoms occur over the next 12 hours:  Shaking chills.  You have a temperature by mouth above 102 F (38.9 C), not controlled by medicine.  Chest, back, or muscle pain.  People around you feel you are not acting correctly or are confused.  Shortness of breath or difficulty breathing.  Dizziness and fainting.  You get a rash or develop hives.  You have a decrease in urine output.  Your urine turns a dark color or changes to pink, red, or brown. Any of the following  symptoms occur over the next 10 days:  You have a temperature by mouth above 102 F (38.9 C), not controlled by medicine.  Shortness of breath.  Weakness after normal activity.  The white part of the eye turns yellow (jaundice).  You have a decrease in the amount of urine or are urinating less often.  Your  urine turns a dark color or changes to pink, red, or brown. Document Released: 05/20/2000 Document Revised: 08/15/2011 Document Reviewed: 01/07/2008 [facility] Patient Information 2013 [place].

## 2012-07-04 NOTE — Telephone Encounter (Signed)
Called pt to check if pt would like to come in earlier for transfusion as there have been some cancellations due to weather today. Pt advised she can be her at 1230 pm. Confirmed with pt, appt moved to 1230pm instead of 1:45pm. Pt verbalized she would be here at that time.

## 2012-07-05 LAB — TYPE AND SCREEN: ABO/RH(D): AB POS

## 2012-07-20 ENCOUNTER — Ambulatory Visit: Payer: Medicare Other

## 2012-07-20 ENCOUNTER — Telehealth: Payer: Self-pay | Admitting: Internal Medicine

## 2012-07-20 ENCOUNTER — Other Ambulatory Visit: Payer: Medicare Other | Admitting: Lab

## 2012-07-23 ENCOUNTER — Other Ambulatory Visit (HOSPITAL_BASED_OUTPATIENT_CLINIC_OR_DEPARTMENT_OTHER): Payer: Medicare Other | Admitting: Lab

## 2012-07-23 ENCOUNTER — Ambulatory Visit (HOSPITAL_BASED_OUTPATIENT_CLINIC_OR_DEPARTMENT_OTHER): Payer: Medicare Other

## 2012-07-23 ENCOUNTER — Other Ambulatory Visit: Payer: Self-pay | Admitting: Medical Oncology

## 2012-07-23 VITALS — BP 105/66 | HR 59 | Temp 96.9°F

## 2012-07-23 DIAGNOSIS — D509 Iron deficiency anemia, unspecified: Secondary | ICD-10-CM

## 2012-07-23 DIAGNOSIS — D638 Anemia in other chronic diseases classified elsewhere: Secondary | ICD-10-CM

## 2012-07-23 LAB — CBC WITH DIFFERENTIAL/PLATELET
BASO%: 1 % (ref 0.0–2.0)
LYMPH%: 20.9 % (ref 14.0–49.7)
MCHC: 33.2 g/dL (ref 31.5–36.0)
MONO#: 0.5 10*3/uL (ref 0.1–0.9)
NEUT#: 1.8 10*3/uL (ref 1.5–6.5)
RBC: 3.31 10*6/uL — ABNORMAL LOW (ref 3.70–5.45)
RDW: 16.3 % — ABNORMAL HIGH (ref 11.2–14.5)
WBC: 3 10*3/uL — ABNORMAL LOW (ref 3.9–10.3)
lymph#: 0.6 10*3/uL — ABNORMAL LOW (ref 0.9–3.3)

## 2012-07-23 MED ORDER — DARBEPOETIN ALFA-POLYSORBATE 300 MCG/0.6ML IJ SOLN
300.0000 ug | Freq: Once | INTRAMUSCULAR | Status: AC
  Administered 2012-07-23: 300 ug via SUBCUTANEOUS
  Filled 2012-07-23: qty 0.6

## 2012-07-30 ENCOUNTER — Other Ambulatory Visit (INDEPENDENT_AMBULATORY_CARE_PROVIDER_SITE_OTHER): Payer: Medicare Other

## 2012-07-30 DIAGNOSIS — E538 Deficiency of other specified B group vitamins: Secondary | ICD-10-CM

## 2012-07-30 MED ORDER — CYANOCOBALAMIN 1000 MCG/ML IJ SOLN
1000.0000 ug | Freq: Once | INTRAMUSCULAR | Status: AC
  Administered 2012-07-30: 1000 ug via INTRAMUSCULAR

## 2012-08-10 ENCOUNTER — Ambulatory Visit (HOSPITAL_BASED_OUTPATIENT_CLINIC_OR_DEPARTMENT_OTHER): Payer: Medicare Other

## 2012-08-10 ENCOUNTER — Other Ambulatory Visit: Payer: Self-pay | Admitting: *Deleted

## 2012-08-10 ENCOUNTER — Ambulatory Visit: Payer: Medicare Other

## 2012-08-10 ENCOUNTER — Other Ambulatory Visit: Payer: Medicare Other | Admitting: Lab

## 2012-08-10 ENCOUNTER — Ambulatory Visit (HOSPITAL_BASED_OUTPATIENT_CLINIC_OR_DEPARTMENT_OTHER): Payer: Medicare Other | Admitting: Lab

## 2012-08-10 VITALS — BP 109/45 | HR 78 | Temp 98.8°F

## 2012-08-10 DIAGNOSIS — D638 Anemia in other chronic diseases classified elsewhere: Secondary | ICD-10-CM

## 2012-08-10 DIAGNOSIS — D509 Iron deficiency anemia, unspecified: Secondary | ICD-10-CM

## 2012-08-10 LAB — CBC WITH DIFFERENTIAL/PLATELET
BASO%: 0.4 % (ref 0.0–2.0)
EOS%: 1.4 % (ref 0.0–7.0)
HCT: 27.3 % — ABNORMAL LOW (ref 34.8–46.6)
LYMPH%: 10.7 % — ABNORMAL LOW (ref 14.0–49.7)
MCH: 29.3 pg (ref 25.1–34.0)
MCHC: 32.7 g/dL (ref 31.5–36.0)
MONO%: 10.8 % (ref 0.0–14.0)
NEUT%: 76.7 % (ref 38.4–76.8)
Platelets: 302 10*3/uL (ref 145–400)
RBC: 3.05 10*6/uL — ABNORMAL LOW (ref 3.70–5.45)

## 2012-08-10 MED ORDER — DARBEPOETIN ALFA-POLYSORBATE 300 MCG/0.6ML IJ SOLN
300.0000 ug | Freq: Once | INTRAMUSCULAR | Status: AC
  Administered 2012-08-10: 300 ug via SUBCUTANEOUS
  Filled 2012-08-10: qty 0.6

## 2012-08-27 ENCOUNTER — Telehealth: Payer: Self-pay | Admitting: Family Medicine

## 2012-08-27 ENCOUNTER — Encounter: Payer: Self-pay | Admitting: Internal Medicine

## 2012-08-27 ENCOUNTER — Encounter: Payer: Medicare Other | Admitting: Family Medicine

## 2012-08-27 NOTE — Telephone Encounter (Signed)
You can put her on a cancellation list

## 2012-08-30 ENCOUNTER — Other Ambulatory Visit (INDEPENDENT_AMBULATORY_CARE_PROVIDER_SITE_OTHER): Payer: BC Managed Care – PPO

## 2012-08-30 DIAGNOSIS — E538 Deficiency of other specified B group vitamins: Secondary | ICD-10-CM

## 2012-08-30 MED ORDER — CYANOCOBALAMIN 1000 MCG/ML IJ SOLN
1000.0000 ug | Freq: Once | INTRAMUSCULAR | Status: AC
  Administered 2012-08-30: 1000 ug via INTRAMUSCULAR

## 2012-08-31 ENCOUNTER — Ambulatory Visit (HOSPITAL_BASED_OUTPATIENT_CLINIC_OR_DEPARTMENT_OTHER): Payer: Medicare Other

## 2012-08-31 ENCOUNTER — Other Ambulatory Visit: Payer: Self-pay | Admitting: Physician Assistant

## 2012-08-31 ENCOUNTER — Other Ambulatory Visit (HOSPITAL_BASED_OUTPATIENT_CLINIC_OR_DEPARTMENT_OTHER): Payer: Medicare Other | Admitting: Lab

## 2012-08-31 VITALS — BP 101/38 | HR 62 | Temp 97.2°F

## 2012-08-31 DIAGNOSIS — D638 Anemia in other chronic diseases classified elsewhere: Secondary | ICD-10-CM

## 2012-08-31 DIAGNOSIS — D509 Iron deficiency anemia, unspecified: Secondary | ICD-10-CM

## 2012-08-31 LAB — CBC & DIFF AND RETIC
BASO%: 0.5 % (ref 0.0–2.0)
EOS%: 2.2 % (ref 0.0–7.0)
Immature Retic Fract: 17.4 % — ABNORMAL HIGH (ref 1.60–10.00)
MCH: 27.7 pg (ref 25.1–34.0)
MCHC: 30.5 g/dL — ABNORMAL LOW (ref 31.5–36.0)
NEUT%: 60 % (ref 38.4–76.8)
RBC: 3.29 10*6/uL — ABNORMAL LOW (ref 3.70–5.45)
RDW: 16.6 % — ABNORMAL HIGH (ref 11.2–14.5)
Retic %: 2.8 % — ABNORMAL HIGH (ref 0.70–2.10)
Retic Ct Abs: 92.12 10*3/uL — ABNORMAL HIGH (ref 33.70–90.70)
WBC: 3.7 10*3/uL — ABNORMAL LOW (ref 3.9–10.3)
lymph#: 0.9 10*3/uL (ref 0.9–3.3)

## 2012-08-31 LAB — IRON AND TIBC
%SAT: 20 % (ref 20–55)
Iron: 44 ug/dL (ref 42–145)
TIBC: 217 ug/dL — ABNORMAL LOW (ref 250–470)
UIBC: 173 ug/dL (ref 125–400)

## 2012-08-31 MED ORDER — DARBEPOETIN ALFA-POLYSORBATE 300 MCG/0.6ML IJ SOLN
300.0000 ug | Freq: Once | INTRAMUSCULAR | Status: AC
  Administered 2012-08-31: 300 ug via SUBCUTANEOUS
  Filled 2012-08-31: qty 0.6

## 2012-09-03 ENCOUNTER — Encounter: Payer: Self-pay | Admitting: Family Medicine

## 2012-09-06 ENCOUNTER — Encounter: Payer: Self-pay | Admitting: Internal Medicine

## 2012-09-06 ENCOUNTER — Telehealth: Payer: Self-pay | Admitting: Internal Medicine

## 2012-09-06 ENCOUNTER — Ambulatory Visit (HOSPITAL_BASED_OUTPATIENT_CLINIC_OR_DEPARTMENT_OTHER): Payer: Medicare Other | Admitting: Internal Medicine

## 2012-09-06 VITALS — BP 101/59 | HR 64 | Temp 97.7°F | Resp 20 | Ht <= 58 in | Wt 73.6 lb

## 2012-09-06 DIAGNOSIS — D638 Anemia in other chronic diseases classified elsewhere: Secondary | ICD-10-CM

## 2012-09-06 DIAGNOSIS — D509 Iron deficiency anemia, unspecified: Secondary | ICD-10-CM

## 2012-09-06 NOTE — Progress Notes (Signed)
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   PRIOR THERAPY:  1) PRBCs transfusion as needed last was given few days ago.   CURRENT THERAPY: 1)  Intravenous Feraheme infusion on as-needed basis. Last dose was given on 12/20/2011, and the patient is scheduled for another treatment tomorrow. 2) Aranesp 300 mcg subcutaneously every 3 weeks for anemia of chronic disease.  INTERVAL HISTORY: Kim [name] 72 y.o. female returns to the clinic today for follow up visit. The patient is feeling fine today with no specific complaints. Her fatigue is much better. She is tolerating her treatment with Aranesp fairly well. She denied having any significant adverse effects. The patient denied having any significant weight loss, night sweats, nausea or vomiting.. She has no chest pain, shortness breath, cough or hemoptysis. She had repeat CBC and iron study performed recently and she is here for evaluation and discussion of her lab results.  MEDICAL HISTORY: Past Medical History  Diagnosis Date  . Osteoporosis     managed by Dr. Nevada Crane at [facility], [place] q2 yrs  . OA (osteoarthritis)     hands,back  . Crohn disease     Dr. Emelda Fear at [facility]  . Glaucoma(365)     Dr. Janyth Contes  . Intestinal malabsorption   . Anemia     related to malabsorption--gets B12 shots and iron infusions, managed by Dr. Julien Nordmann  . Anemia of chronic disease 06/07/2012    ALLERGIES:  is allergic to sulfa antibiotics.  MEDICATIONS:  Current Outpatient Prescriptions  Medication Sig Dispense Refill  . Adalimumab (HUMIRA [place]) Inject into the skin. Every other week      . alendronate (FOSAMAX) 35 MG tablet Take 35 mg by mouth every 7 (seven) days. Take with a full glass of water on an empty stomach.         . darbepoetin (ARANESP) 300 MCG/0.6ML SOLN Inject 300 mcg into the skin every 21 ( twenty-one) days.      . dorzolamide-timolol (COSOPT) 22.3-6.8 MG/ML ophthalmic solution 1 drop daily.        . hydrOXYzine (ATARAX/VISTARIL) 25 MG tablet Take 25 mg by mouth as needed.        . vitamin B-12 (CYANOCOBALAMIN) 1000 MCG tablet Inject 1,000 mcg as directed daily.      . B Complex-C (B-COMPLEX WITH VITAMIN C) tablet Take 1 tablet by mouth daily.      . Calcium Carbonate-Vitamin D (CALCIUM + D PO) Take 4 tablets by mouth daily. Equates to 1551m of Ca+ and 1,000IU of vitamin D      . Flaxseed, Linseed, (FLAX SEED OIL) 1000 MG CAPS Take 1 capsule by mouth daily.      . Glucosamine-Chondroit-Vit C-Mn (GLUCOSAMINE 1500 COMPLEX PO) Take by mouth daily.        . Multiple Vitamin (MULTIVITAMIN) tablet Take 1 tablet by mouth daily.        . Omega-3 Fatty Acids (FISH OIL) 1200 MG CAPS Take 1 capsule by mouth daily.      . [DISCONTINUED] potassium chloride 40 MEQ/15ML (20%) LIQD Take 15 mLs (40 mEq total) by mouth as directed.  150 mL  0   No current facility-administered medications for this visit.    SURGICAL HISTORY:  Past Surgical History  Procedure Laterality Date  . Total colectomy  age 24  . Internal ostomy pouch  1994    REVIEW OF SYSTEMS:  A comprehensive review of systems was negative.   PHYSICAL EXAMINATION: General appearance: alert, cooperative and no distress Head: Normocephalic, without obvious abnormality, atraumatic Neck: no adenopathy Resp: clear to auscultation bilaterally Cardio: regular rate and rhythm, S1, S2 normal, no murmur, click, rub or gallop GI: soft, non-tender; bowel sounds normal; no masses,  no organomegaly Extremities: extremities normal, atraumatic, no cyanosis or edema  ECOG PERFORMANCE STATUS: 1 - Symptomatic but completely ambulatory  Blood pressure 101/59, pulse 64, temperature 97.7 F (36.5 C), temperature source Oral, resp. rate 20, height 4' 9"  (1.448 m),  weight 73 lb 9.6 oz (33.385 kg).  LABORATORY DATA: Lab Results  Component Value Date   WBC 3.7* 08/31/2012   HGB 9.1* 08/31/2012   HCT 29.8* 08/31/2012   MCV 90.6 08/31/2012   PLT 242 08/31/2012      Chemistry      Component Value Date/Time   NA 136 09/05/2011 1558   K 3.0* 09/05/2011 1558   CL 101 09/05/2011 1558   CO2 27 09/05/2011 1558   BUN 34* 09/05/2011 1558   CREATININE 1.13* 09/05/2011 1558   CREATININE 1.04 08/22/2011 1106      Component Value Date/Time   CALCIUM 8.8 09/05/2011 1558   ALKPHOS 44 09/05/2011 1558   AST 16 09/05/2011 1558   ALT 11 09/05/2011 1558   BILITOT 0.2* 09/05/2011 1558       RADIOGRAPHIC STUDIES: No results found.  ASSESSMENT: this is a very pleasant 72 years old white female with history of iron deficiency anemia secondary to malabsorption as well as anemia of chronic disease, currently on treatment with Aranesp 300 mcg subcutaneously every 3 weeks as well as Feraheme infusion on as-needed basis   PLAN: the patient is doing fine and her hemoglobin and hematocrit are stable. She is currently asymptomatic. I recommended for her to continue treatment with Aranesp as scheduled every 3 weeks. Her iron study is normal today. There is no need to start her on IV Feraheme now. I would see the patient back for follow up visit in 3 months with repeat CBC and iron study. She was advised to call immediately if she has any concerning symptoms in the interval.  All questions were answered. The patient knows to call the clinic with any problems, questions or concerns. We can certainly see the patient much sooner if necessary.

## 2012-09-06 NOTE — Telephone Encounter (Signed)
Gave pt appt for lab, inj and MD for April, May , june and July 2014

## 2012-09-08 NOTE — Patient Instructions (Signed)
Your hemoglobin and hematocrit as well as the iron study are stable. Continue treatment with Aranesp. Follow up visit in 3 months

## 2012-09-18 ENCOUNTER — Encounter: Payer: Self-pay | Admitting: Internal Medicine

## 2012-09-20 ENCOUNTER — Other Ambulatory Visit: Payer: Self-pay | Admitting: *Deleted

## 2012-09-20 DIAGNOSIS — D509 Iron deficiency anemia, unspecified: Secondary | ICD-10-CM

## 2012-09-21 ENCOUNTER — Other Ambulatory Visit (HOSPITAL_BASED_OUTPATIENT_CLINIC_OR_DEPARTMENT_OTHER): Payer: Medicare Other | Admitting: Lab

## 2012-09-21 ENCOUNTER — Ambulatory Visit (HOSPITAL_BASED_OUTPATIENT_CLINIC_OR_DEPARTMENT_OTHER): Payer: Medicare Other

## 2012-09-21 ENCOUNTER — Other Ambulatory Visit: Payer: Self-pay | Admitting: *Deleted

## 2012-09-21 ENCOUNTER — Encounter (HOSPITAL_COMMUNITY)
Admission: RE | Admit: 2012-09-21 | Discharge: 2012-09-21 | Disposition: A | Discharge status: Home / Self Care | Payer: Medicare Other | Source: Ambulatory Visit | Attending: Internal Medicine | Admitting: Internal Medicine

## 2012-09-21 ENCOUNTER — Ambulatory Visit: Payer: Medicare Other

## 2012-09-21 ENCOUNTER — Other Ambulatory Visit: Payer: Self-pay | Admitting: Physician Assistant

## 2012-09-21 ENCOUNTER — Ambulatory Visit: Payer: Medicare Other | Admitting: Lab

## 2012-09-21 VITALS — BP 97/40 | HR 54 | Temp 98.0°F

## 2012-09-21 VITALS — BP 92/50 | HR 63 | Temp 97.1°F | Resp 20

## 2012-09-21 DIAGNOSIS — D509 Iron deficiency anemia, unspecified: Secondary | ICD-10-CM

## 2012-09-21 DIAGNOSIS — K909 Intestinal malabsorption, unspecified: Secondary | ICD-10-CM | POA: Insufficient documentation

## 2012-09-21 DIAGNOSIS — K5090 Crohn's disease, unspecified, without complications: Secondary | ICD-10-CM | POA: Insufficient documentation

## 2012-09-21 DIAGNOSIS — D638 Anemia in other chronic diseases classified elsewhere: Secondary | ICD-10-CM

## 2012-09-21 DIAGNOSIS — D649 Anemia, unspecified: Secondary | ICD-10-CM

## 2012-09-21 LAB — CBC WITH DIFFERENTIAL/PLATELET
Basophils Absolute: 0 10*3/uL (ref 0.0–0.1)
EOS%: 3.2 % (ref 0.0–7.0)
HCT: 24.1 % — ABNORMAL LOW (ref 34.8–46.6)
HGB: 7.5 g/dL — ABNORMAL LOW (ref 11.6–15.9)
LYMPH%: 13.4 % — ABNORMAL LOW (ref 14.0–49.7)
MCH: 28 pg (ref 25.1–34.0)
NEUT%: 70 % (ref 38.4–76.8)
Platelets: 314 10*3/uL (ref 145–400)
lymph#: 0.5 10*3/uL — ABNORMAL LOW (ref 0.9–3.3)

## 2012-09-21 LAB — PREPARE RBC (CROSSMATCH)

## 2012-09-21 MED ORDER — SODIUM CHLORIDE 0.9 % IJ SOLN
3.0000 mL | INTRAMUSCULAR | Status: DC | PRN
  Filled 2012-09-21: qty 10

## 2012-09-21 MED ORDER — DARBEPOETIN ALFA-POLYSORBATE 300 MCG/0.6ML IJ SOLN
300.0000 ug | Freq: Once | INTRAMUSCULAR | Status: AC
  Administered 2012-09-21: 300 ug via SUBCUTANEOUS
  Filled 2012-09-21: qty 0.6

## 2012-09-21 MED ORDER — HEPARIN SOD (PORK) LOCK FLUSH 100 UNIT/ML IV SOLN
500.0000 [IU] | Freq: Every day | INTRAVENOUS | Status: DC | PRN
  Filled 2012-09-21: qty 5

## 2012-09-21 MED ORDER — HEPARIN SOD (PORK) LOCK FLUSH 100 UNIT/ML IV SOLN
250.0000 [IU] | INTRAVENOUS | Status: DC | PRN
  Filled 2012-09-21: qty 5

## 2012-09-21 MED ORDER — SODIUM CHLORIDE 0.9 % IV SOLN: 250.0000 mL | Freq: Once | INTRAVENOUS | Status: DC

## 2012-09-21 MED ORDER — DIPHENHYDRAMINE HCL 25 MG PO CAPS: 25.0000 mg | ORAL_CAPSULE | Freq: Once | ORAL | Status: DC

## 2012-09-21 MED ORDER — SODIUM CHLORIDE 0.9 % IJ SOLN
10.0000 mL | INTRAMUSCULAR | Status: DC | PRN
  Filled 2012-09-21: qty 10

## 2012-09-21 MED ORDER — ACETAMINOPHEN 325 MG PO TABS: 650.0000 mg | ORAL_TABLET | Freq: Once | ORAL | Status: DC

## 2012-09-21 NOTE — Patient Instructions (Signed)
Blood Transfusion Information WHAT IS A BLOOD TRANSFUSION? A transfusion is the replacement of blood or some of its parts. Blood is made up of multiple cells which provide different functions.  Red blood cells carry oxygen and are used for blood loss replacement.  White blood cells fight against infection.  Platelets control bleeding.  Plasma helps clot blood.  Other blood products are available for specialized needs, such as hemophilia or other clotting disorders. BEFORE THE TRANSFUSION  Who gives blood for transfusions?   You may be able to donate blood to be used at a later date on yourself (autologous donation).  Relatives can be asked to donate blood. This is generally not any safer than if you have received blood from a stranger. The same precautions are taken to ensure safety when a relative's blood is donated.  Healthy volunteers who are fully evaluated to make sure their blood is safe. This is blood bank blood. Transfusion therapy is the safest it has ever been in the practice of medicine. Before blood is taken from a donor, a complete history is taken to make sure that person has no history of diseases nor engages in risky social behavior (examples are intravenous drug use or sexual activity with multiple partners). The donor's travel history is screened to minimize risk of transmitting infections, such as malaria. The donated blood is tested for signs of infectious diseases, such as HIV and hepatitis. The blood is then tested to be sure it is compatible with you in order to minimize the chance of a transfusion reaction. If you or a relative donates blood, this is often done in anticipation of surgery and is not appropriate for emergency situations. It takes many days to process the donated blood. RISKS AND COMPLICATIONS Although transfusion therapy is very safe and saves many lives, the main dangers of transfusion include:   Getting an infectious disease.  Developing a  transfusion reaction. This is an allergic reaction to something in the blood you were given. Every precaution is taken to prevent this. The decision to have a blood transfusion has been considered carefully by your caregiver before blood is given. Blood is not given unless the benefits outweigh the risks. AFTER THE TRANSFUSION  Right after receiving a blood transfusion, you will usually feel much better and more energetic. This is especially true if your red blood cells have gotten low (anemic). The transfusion raises the level of the red blood cells which carry oxygen, and this usually causes an energy increase.  The nurse administering the transfusion will monitor you carefully for complications. HOME CARE INSTRUCTIONS  No special instructions are needed after a transfusion. You may find your energy is better. Speak with your caregiver about any limitations on activity for underlying diseases you may have. SEEK MEDICAL CARE IF:   Your condition is not improving after your transfusion.  You develop redness or irritation at the intravenous (IV) site. SEEK IMMEDIATE MEDICAL CARE IF:  Any of the following symptoms occur over the next 12 hours:  Shaking chills.  You have a temperature by mouth above 102 F (38.9 C), not controlled by medicine.  Chest, back, or muscle pain.  People around you feel you are not acting correctly or are confused.  Shortness of breath or difficulty breathing.  Dizziness and fainting.  You get a rash or develop hives.  You have a decrease in urine output.  Your urine turns a dark color or changes to pink, red, or brown. Any of the following  symptoms occur over the next 10 days:  You have a temperature by mouth above 102 F (38.9 C), not controlled by medicine.  Shortness of breath.  Weakness after normal activity.  The white part of the eye turns yellow (jaundice).  You have a decrease in the amount of urine or are urinating less often.  Your  urine turns a dark color or changes to pink, red, or brown. Document Released: 05/20/2000 Document Revised: 08/15/2011 Document Reviewed: 01/07/2008 [facility] Patient Information 2013 [place].

## 2012-09-22 LAB — TYPE AND SCREEN: Unit division: 0

## 2012-10-03 ENCOUNTER — Other Ambulatory Visit (INDEPENDENT_AMBULATORY_CARE_PROVIDER_SITE_OTHER): Payer: Medicare Other

## 2012-10-03 DIAGNOSIS — D509 Iron deficiency anemia, unspecified: Secondary | ICD-10-CM

## 2012-10-03 MED ORDER — CYANOCOBALAMIN 1000 MCG/ML IJ SOLN
1000.0000 ug | Freq: Once | INTRAMUSCULAR | Status: AC
  Administered 2012-10-03: 1000 ug via INTRAMUSCULAR

## 2012-10-12 ENCOUNTER — Other Ambulatory Visit: Payer: Self-pay | Admitting: Medical Oncology

## 2012-10-12 ENCOUNTER — Ambulatory Visit (HOSPITAL_BASED_OUTPATIENT_CLINIC_OR_DEPARTMENT_OTHER): Payer: Medicare Other

## 2012-10-12 ENCOUNTER — Other Ambulatory Visit (HOSPITAL_BASED_OUTPATIENT_CLINIC_OR_DEPARTMENT_OTHER): Payer: Medicare Other | Admitting: Lab

## 2012-10-12 VITALS — BP 114/32 | HR 57 | Temp 97.4°F

## 2012-10-12 DIAGNOSIS — D509 Iron deficiency anemia, unspecified: Secondary | ICD-10-CM

## 2012-10-12 DIAGNOSIS — D638 Anemia in other chronic diseases classified elsewhere: Secondary | ICD-10-CM

## 2012-10-12 LAB — CBC WITH DIFFERENTIAL/PLATELET
Basophils Absolute: 0 10*3/uL (ref 0.0–0.1)
EOS%: 2.6 % (ref 0.0–7.0)
Eosinophils Absolute: 0.1 10*3/uL (ref 0.0–0.5)
HCT: 30.8 % — ABNORMAL LOW (ref 34.8–46.6)
HGB: 9.8 g/dL — ABNORMAL LOW (ref 11.6–15.9)
MCH: 27.2 pg (ref 25.1–34.0)
NEUT#: 2.9 10*3/uL (ref 1.5–6.5)
NEUT%: 68.4 % (ref 38.4–76.8)
lymph#: 0.6 10*3/uL — ABNORMAL LOW (ref 0.9–3.3)

## 2012-10-12 LAB — HOLD TUBE, BLOOD BANK

## 2012-10-12 MED ORDER — DARBEPOETIN ALFA-POLYSORBATE 300 MCG/0.6ML IJ SOLN
300.0000 ug | Freq: Once | INTRAMUSCULAR | Status: AC
  Administered 2012-10-12: 300 ug via SUBCUTANEOUS
  Filled 2012-10-12: qty 0.6

## 2012-11-01 ENCOUNTER — Other Ambulatory Visit: Payer: Self-pay | Admitting: Medical Oncology

## 2012-11-01 ENCOUNTER — Encounter: Payer: Self-pay | Admitting: Family Medicine

## 2012-11-01 DIAGNOSIS — D509 Iron deficiency anemia, unspecified: Secondary | ICD-10-CM

## 2012-11-02 ENCOUNTER — Ambulatory Visit (HOSPITAL_BASED_OUTPATIENT_CLINIC_OR_DEPARTMENT_OTHER): Payer: Medicare Other

## 2012-11-02 ENCOUNTER — Other Ambulatory Visit (HOSPITAL_BASED_OUTPATIENT_CLINIC_OR_DEPARTMENT_OTHER): Payer: Medicare Other | Admitting: Lab

## 2012-11-02 VITALS — BP 115/37 | HR 67 | Temp 97.7°F

## 2012-11-02 DIAGNOSIS — D638 Anemia in other chronic diseases classified elsewhere: Secondary | ICD-10-CM

## 2012-11-02 DIAGNOSIS — D509 Iron deficiency anemia, unspecified: Secondary | ICD-10-CM

## 2012-11-02 LAB — CBC WITH DIFFERENTIAL/PLATELET
Basophils Absolute: 0 10*3/uL (ref 0.0–0.1)
EOS%: 2.7 % (ref 0.0–7.0)
Eosinophils Absolute: 0.1 10*3/uL (ref 0.0–0.5)
HCT: 26.2 % — ABNORMAL LOW (ref 34.8–46.6)
HGB: 8.3 g/dL — ABNORMAL LOW (ref 11.6–15.9)
MCH: 25.4 pg (ref 25.1–34.0)
MCV: 80.6 fL (ref 79.5–101.0)
NEUT#: 3.2 10*3/uL (ref 1.5–6.5)
NEUT%: 72.5 % (ref 38.4–76.8)
RDW: 19.6 % — ABNORMAL HIGH (ref 11.2–14.5)
lymph#: 0.7 10*3/uL — ABNORMAL LOW (ref 0.9–3.3)

## 2012-11-02 MED ORDER — DARBEPOETIN ALFA-POLYSORBATE 300 MCG/0.6ML IJ SOLN
300.0000 ug | Freq: Once | INTRAMUSCULAR | Status: AC
  Administered 2012-11-02: 300 ug via SUBCUTANEOUS
  Filled 2012-11-02: qty 0.6

## 2012-11-02 NOTE — Progress Notes (Signed)
Quick Note:  Call patient with the result and arrange for PRBCs transfusion if symptomatic ______

## 2012-11-05 ENCOUNTER — Other Ambulatory Visit: Payer: Self-pay | Admitting: *Deleted

## 2012-11-05 ENCOUNTER — Telehealth: Payer: Self-pay | Admitting: *Deleted

## 2012-11-05 DIAGNOSIS — D509 Iron deficiency anemia, unspecified: Secondary | ICD-10-CM

## 2012-11-05 NOTE — Telephone Encounter (Signed)
Per Dr Vista Mink, hbg 8.3, call and assess pt to see how she feels and whether she needs a blood transfusion.  Pt states that she does not want blood right now but will reassess how she feels when she returns on 6/20.  She will also call back sooner if she feels more fatigued before 6/20.  Type and hold added to lab orders on 6/20.  SLJ

## 2012-11-08 ENCOUNTER — Other Ambulatory Visit: Payer: Self-pay | Admitting: Internal Medicine

## 2012-11-23 ENCOUNTER — Ambulatory Visit (HOSPITAL_BASED_OUTPATIENT_CLINIC_OR_DEPARTMENT_OTHER): Payer: Medicare Other

## 2012-11-23 ENCOUNTER — Encounter (HOSPITAL_COMMUNITY)
Admission: RE | Admit: 2012-11-23 | Discharge: 2012-11-23 | Disposition: A | Discharge status: Home / Self Care | Payer: Medicare Other | Source: Ambulatory Visit | Attending: Internal Medicine | Admitting: Internal Medicine

## 2012-11-23 ENCOUNTER — Other Ambulatory Visit: Payer: Self-pay | Admitting: *Deleted

## 2012-11-23 ENCOUNTER — Other Ambulatory Visit (HOSPITAL_BASED_OUTPATIENT_CLINIC_OR_DEPARTMENT_OTHER): Payer: Medicare Other | Admitting: Lab

## 2012-11-23 VITALS — BP 117/52 | HR 70 | Temp 98.2°F

## 2012-11-23 VITALS — BP 121/75 | HR 80 | Temp 98.1°F | Resp 16

## 2012-11-23 DIAGNOSIS — D508 Other iron deficiency anemias: Secondary | ICD-10-CM

## 2012-11-23 DIAGNOSIS — D649 Anemia, unspecified: Secondary | ICD-10-CM | POA: Insufficient documentation

## 2012-11-23 DIAGNOSIS — D638 Anemia in other chronic diseases classified elsewhere: Secondary | ICD-10-CM

## 2012-11-23 DIAGNOSIS — K909 Intestinal malabsorption, unspecified: Secondary | ICD-10-CM

## 2012-11-23 DIAGNOSIS — K5090 Crohn's disease, unspecified, without complications: Secondary | ICD-10-CM

## 2012-11-23 DIAGNOSIS — D509 Iron deficiency anemia, unspecified: Secondary | ICD-10-CM

## 2012-11-23 LAB — CBC WITH DIFFERENTIAL/PLATELET
Basophils Absolute: 0 10*3/uL (ref 0.0–0.1)
EOS%: 3.2 % (ref 0.0–7.0)
Eosinophils Absolute: 0.1 10*3/uL (ref 0.0–0.5)
HCT: 26.4 % — ABNORMAL LOW (ref 34.8–46.6)
HGB: 8.3 g/dL — ABNORMAL LOW (ref 11.6–15.9)
LYMPH%: 13.7 % — ABNORMAL LOW (ref 14.0–49.7)
MCH: 26.2 pg (ref 25.1–34.0)
MCV: 82.9 fL (ref 79.5–101.0)
MONO%: 15.9 % — ABNORMAL HIGH (ref 0.0–14.0)
NEUT#: 2.2 10*3/uL (ref 1.5–6.5)
NEUT%: 66.5 % (ref 38.4–76.8)
Platelets: 352 10*3/uL (ref 145–400)

## 2012-11-23 MED ORDER — CYANOCOBALAMIN 1000 MCG/ML IJ SOLN
1000.0000 ug | Freq: Once | INTRAMUSCULAR | Status: AC
  Administered 2012-11-23: 1000 ug via INTRAMUSCULAR

## 2012-11-23 MED ORDER — SODIUM CHLORIDE 0.9 % IV SOLN: 250.0000 mL | Freq: Once | INTRAVENOUS | Status: DC

## 2012-11-23 MED ORDER — DARBEPOETIN ALFA-POLYSORBATE 300 MCG/0.6ML IJ SOLN
300.0000 ug | Freq: Once | INTRAMUSCULAR | Status: AC
  Administered 2012-11-23: 300 ug via SUBCUTANEOUS
  Filled 2012-11-23: qty 0.6

## 2012-11-23 NOTE — Patient Instructions (Addendum)
Blood Transfusion  A blood transfusion replaces your blood or some of its parts. Blood is replaced when you have lost blood because of surgery, an accident, or for severe blood conditions like anemia. You can donate blood to be used on yourself if you have a planned surgery. If you lose blood during that surgery, your own blood can be given back to you. Any blood given to you is checked to make sure it matches your blood type. Your temperature, blood pressure, and heart rate (vital signs) will be checked often.  GET HELP RIGHT AWAY IF:   You feel sick to your stomach (nauseous) or throw up (vomit).  You have watery poop (diarrhea).  You have shortness of breath or trouble breathing.  You have blood in your pee (urine) or have dark colored pee.  You have chest pain or tightness.  Your eyes or skin turn yellow (jaundice).  You have a temperature by mouth above 102 F (38.9 C), not controlled by medicine.  You start to shake and have chills.  You develop a a red rash (hives) or feel itchy.  You develop lightheadedness or feel confused.  You develop back, joint, or muscle pain.  You do not feel hungry (lost appetite).  You feel tired, restless, or nervous.  You develop belly (abdominal) cramps. Document Released: 08/19/2008 Document Revised: 08/15/2011 Document Reviewed: 08/19/2008 [facility] Patient Information 2014 [place], [place].

## 2012-11-23 NOTE — Progress Notes (Signed)
Pre blood vitals.

## 2012-11-23 NOTE — Progress Notes (Signed)
Per pt request, she does not want to take benadryl before blood transfusion.  SLJ

## 2012-11-24 ENCOUNTER — Ambulatory Visit (HOSPITAL_BASED_OUTPATIENT_CLINIC_OR_DEPARTMENT_OTHER): Payer: Medicare Other

## 2012-11-24 VITALS — BP 101/66 | HR 65 | Temp 97.4°F | Resp 16

## 2012-11-24 DIAGNOSIS — D509 Iron deficiency anemia, unspecified: Secondary | ICD-10-CM

## 2012-11-24 DIAGNOSIS — D638 Anemia in other chronic diseases classified elsewhere: Secondary | ICD-10-CM

## 2012-11-24 DIAGNOSIS — K909 Intestinal malabsorption, unspecified: Secondary | ICD-10-CM

## 2012-11-24 MED ORDER — SODIUM CHLORIDE 0.9 % IV SOLN
250.0000 mL | Freq: Once | INTRAVENOUS | Status: AC
  Administered 2012-11-24: 250 mL via INTRAVENOUS

## 2012-11-24 MED ORDER — ACETAMINOPHEN 325 MG PO TABS: 650.0000 mg | ORAL_TABLET | Freq: Once | ORAL | Status: DC

## 2012-11-24 NOTE — Patient Instructions (Addendum)
Call MD for problems.

## 2012-11-25 LAB — TYPE AND SCREEN
ABO/RH(D): AB POS
Antibody Screen: NEGATIVE
Unit division: 0

## 2012-12-06 ENCOUNTER — Ambulatory Visit: Payer: Medicare Other | Admitting: Internal Medicine

## 2012-12-06 ENCOUNTER — Other Ambulatory Visit (HOSPITAL_BASED_OUTPATIENT_CLINIC_OR_DEPARTMENT_OTHER): Payer: Medicare Other | Admitting: Lab

## 2012-12-06 DIAGNOSIS — D638 Anemia in other chronic diseases classified elsewhere: Secondary | ICD-10-CM

## 2012-12-06 DIAGNOSIS — D509 Iron deficiency anemia, unspecified: Secondary | ICD-10-CM

## 2012-12-06 LAB — IRON AND TIBC CHCC
Iron: 40 ug/dL — ABNORMAL LOW (ref 41–142)
TIBC: 189 ug/dL — ABNORMAL LOW (ref 236–444)

## 2012-12-06 LAB — CBC WITH DIFFERENTIAL/PLATELET
BASO%: 0.7 % (ref 0.0–2.0)
EOS%: 3.6 % (ref 0.0–7.0)
MCH: 27.5 pg (ref 25.1–34.0)
MCHC: 32.3 g/dL (ref 31.5–36.0)
MONO%: 13.9 % (ref 0.0–14.0)
RBC: 4.03 10*6/uL (ref 3.70–5.45)
RDW: 20 % — ABNORMAL HIGH (ref 11.2–14.5)
lymph#: 0.5 10*3/uL — ABNORMAL LOW (ref 0.9–3.3)

## 2012-12-12 ENCOUNTER — Ambulatory Visit: Payer: Medicare Other

## 2012-12-12 ENCOUNTER — Encounter: Payer: Self-pay | Admitting: Internal Medicine

## 2012-12-12 ENCOUNTER — Ambulatory Visit (HOSPITAL_BASED_OUTPATIENT_CLINIC_OR_DEPARTMENT_OTHER): Payer: Medicare Other | Admitting: Internal Medicine

## 2012-12-12 VITALS — BP 113/66 | HR 71 | Temp 96.8°F | Resp 17 | Ht <= 58 in | Wt 75.0 lb

## 2012-12-12 DIAGNOSIS — Z9049 Acquired absence of other specified parts of digestive tract: Secondary | ICD-10-CM

## 2012-12-12 DIAGNOSIS — D509 Iron deficiency anemia, unspecified: Secondary | ICD-10-CM

## 2012-12-12 DIAGNOSIS — K912 Postsurgical malabsorption, not elsewhere classified: Secondary | ICD-10-CM

## 2012-12-12 DIAGNOSIS — D638 Anemia in other chronic diseases classified elsewhere: Secondary | ICD-10-CM

## 2012-12-12 DIAGNOSIS — K5090 Crohn's disease, unspecified, without complications: Secondary | ICD-10-CM

## 2012-12-12 NOTE — Patient Instructions (Signed)
Continue treatment with Aranesp and B12 injection every 4 weeks. Followup visit in 3 months with repeat CBC and iron study.

## 2012-12-12 NOTE — Progress Notes (Signed)
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   PRIOR THERAPY:  1) PRBCs transfusion as needed last was given few days ago.   CURRENT THERAPY:  1) Intravenous Feraheme infusion on as-needed basis. Last dose was given on 12/20/2011, and the patient is scheduled for another treatment tomorrow.  2) Aranesp 300 mcg subcutaneously every 3 weeks for anemia of chronic disease. 3) vitamin B12 injection on monthly basis.   INTERVAL HISTORY: Kim [name] 72 y.o. female returns to the clinic today for followup visit. The patient is feeling fine today. She received 2 units of PRBCs transfusion on 11/24/2012. She felt much better. She denied having any significant fatigue or weakness. The patient denied having any significant chest pain, shortness breath, cough or hemoptysis. She was seen recently by Dr. Emelda Fear at [facility] gastroenterology. She is considering switching her treatment for the inflammatory bowel disease to Vedolizumab. The patient is currently on treatment with Aranesp every 3 week and vitamin B12 injection monthly. She had repeat CBC and iron study performed recently and she is here for evaluation and discussion of her lab results.  MEDICAL HISTORY: Past Medical History  Diagnosis Date  . Osteoporosis     managed by Dr. Nevada Crane at [facility], [place] q2 yrs  . OA (osteoarthritis)     hands,back  . Crohn disease     Dr. Emelda Fear at [facility]  . Glaucoma     Dr. Janyth Contes  . Intestinal malabsorption   . Anemia     related to malabsorption--gets B12 shots and iron infusions, managed by Dr. Julien Nordmann  . Anemia of chronic disease 06/07/2012  . Iron deficiency anemia 05/10/2011  . Cancer     skin  . History of chicken pox   . History of eating disorder   . H/O  syncope   . History of blood transfusion     ALLERGIES:  is allergic to antihistamines, chlorpheniramine-type; metronidazole; and sulfa antibiotics.  MEDICATIONS:  Current Outpatient Prescriptions  Medication Sig Dispense Refill  . Adalimumab (HUMIRA [place]) Inject into the skin. Every other week      . alendronate (FOSAMAX) 35 MG tablet Take 35 mg by mouth every 7 (seven) days. Take with a full glass of water on an empty stomach.       . B Complex-C (B-COMPLEX WITH VITAMIN C) tablet Take 1 tablet by mouth daily.      . Calcium Carbonate-Vitamin D (CALCIUM + D PO) Take 4 tablets by mouth daily. Equates to 1541m of Ca+ and 1,000IU of vitamin D      . darbepoetin (ARANESP) 300 MCG/0.6ML SOLN Inject 300 mcg into the skin every 21 ( twenty-one) days.      . dorzolamide-timolol (COSOPT) 22.3-6.8 MG/ML ophthalmic solution 1 drop daily.        . Flaxseed, Linseed, (FLAX SEED OIL) 1000 MG CAPS Take 1 capsule by mouth daily.      . Glucosamine-Chondroit-Vit C-Mn (GLUCOSAMINE 1500 COMPLEX PO) Take by mouth daily.        .Marland Kitchenlatanoprost (XALATAN) 0.005 % ophthalmic solution Apply 1 drop to eye daily. Place into both eyes Daily.      . Multiple Vitamin (MULTIVITAMIN) tablet Take 1 tablet by mouth daily.        . Omega-3 Fatty Acids (FISH OIL) 1200  MG CAPS Take 1 capsule by mouth daily.      . vitamin B-12 (CYANOCOBALAMIN) 1000 MCG tablet Inject 1,000 mcg as directed every 30 (thirty) days.       . [DISCONTINUED] potassium chloride 40 MEQ/15ML (20%) LIQD Take 15 mLs (40 mEq total) by mouth as directed.  150 mL  0   No current facility-administered medications for this visit.   Facility-Administered Medications Ordered in Other Visits  Medication Dose Route Frequency Provider Last Rate Last Dose  . 0.9 %  sodium chloride infusion  250 mL Intravenous Once Adrena E Johnson, PA-C      . acetaminophen (TYLENOL) tablet 650 mg  650 mg Oral Once [employer], PA-C      . diphenhydrAMINE (BENADRYL) capsule 25  mg  25 mg Oral Once [employer], PA-C      . heparin lock flush 100 unit/mL  500 Units Intracatheter Daily PRN Carlton Adam, PA-C      . heparin lock flush 100 unit/mL  250 Units Intracatheter PRN Adrena E Johnson, PA-C      . sodium chloride 0.9 % injection 10 mL  10 mL Intracatheter PRN Adrena E Johnson, PA-C      . sodium chloride 0.9 % injection 3 mL  3 mL Intracatheter PRN Carlton Adam, PA-C        SURGICAL HISTORY:  Past Surgical History  Procedure Laterality Date  . Total colectomy  age 28  . Internal ostomy pouch  1994  . Crohn  2009    REVIEW OF SYSTEMS:  A comprehensive review of systems was negative.   PHYSICAL EXAMINATION: General appearance: alert, cooperative and no distress Head: Normocephalic, without obvious abnormality, atraumatic Neck: no adenopathy Lymph nodes: Cervical, supraclavicular, and axillary nodes normal. Resp: clear to auscultation bilaterally Cardio: regular rate and rhythm, S1, S2 normal, no murmur, click, rub or gallop GI: normal findings: no masses palpable and no organomegaly Extremities: extremities normal, atraumatic, no cyanosis or edema  ECOG PERFORMANCE STATUS: 1 - Symptomatic but completely ambulatory  Blood pressure 113/66, pulse 71, temperature 96.8 F (36 C), temperature source Oral, resp. rate 17, height 4' 9"  (1.448 m), weight 75 lb (34.02 kg).  LABORATORY DATA: Lab Results  Component Value Date   WBC 5.3 12/06/2012   HGB 11.1* 12/06/2012   HCT 34.3* 12/06/2012   MCV 85.2 12/06/2012   PLT 298 12/06/2012      Chemistry      Component Value Date/Time   NA 136 09/05/2011 1558   K 3.0* 09/05/2011 1558   CL 101 09/05/2011 1558   CO2 27 09/05/2011 1558   BUN 34* 09/05/2011 1558   CREATININE 1.13* 09/05/2011 1558   CREATININE 1.04 08/22/2011 1106      Component Value Date/Time   CALCIUM 8.8 09/05/2011 1558   ALKPHOS 44 09/05/2011 1558   AST 16 09/05/2011 1558   ALT 11 09/05/2011 1558   BILITOT 0.2* 09/05/2011 1558       RADIOGRAPHIC  STUDIES: No results found.  ASSESSMENT AND PLAN: This is a very pleasant 72 years old white female with history of iron deficiency anemia as well as anemia of chronic disease currently on treatment with Feraheme infusion on as-needed basis as well as Aranesp every 3 weeks and vitamin B12 injection on monthly basis. She is tolerating her treatment fairly well and the patient feels the best after receiving 2 units of PRBCs transfusion recently. Had iron study is unremarkable today. I will continue the  patient on the same treatment but she would like to receive both the Aranesp and B12 injection at the same time. I will change the frequency of Aranesp to every 4 weeks to coincide with the B12 injection. I would see the patient back for followup visit in 3 months with repeat CBC and iron study. She was advised to call immediately if she has any concerning symptoms in the interval.  All questions were answered. The patient knows to call the clinic with any problems, questions or concerns. We can certainly see the patient much sooner if necessary.

## 2012-12-13 ENCOUNTER — Telehealth: Payer: Self-pay | Admitting: *Deleted

## 2012-12-13 NOTE — Telephone Encounter (Signed)
sw pt she gv me the appt d/t that would work best for her in which was 01/22/13@11 :30am, 02/19/13@12 , 03/19/13 labs @ 11am, ov 11:30am and inj @11 :45am. She also gv a d/t for 04/16/13 @ 11:30. Pt asked that i mail her these appts as well. im placing a letter/cal in the mail today...td

## 2012-12-14 ENCOUNTER — Ambulatory Visit: Payer: Medicare Other

## 2012-12-17 ENCOUNTER — Encounter: Payer: Self-pay | Admitting: Family Medicine

## 2012-12-17 ENCOUNTER — Ambulatory Visit: Payer: Medicare Other | Admitting: Internal Medicine

## 2012-12-18 ENCOUNTER — Other Ambulatory Visit: Payer: Self-pay | Admitting: *Deleted

## 2012-12-18 DIAGNOSIS — D638 Anemia in other chronic diseases classified elsewhere: Secondary | ICD-10-CM

## 2012-12-19 ENCOUNTER — Ambulatory Visit (HOSPITAL_BASED_OUTPATIENT_CLINIC_OR_DEPARTMENT_OTHER): Payer: Medicare Other

## 2012-12-19 ENCOUNTER — Other Ambulatory Visit (HOSPITAL_BASED_OUTPATIENT_CLINIC_OR_DEPARTMENT_OTHER): Payer: Medicare Other | Admitting: Lab

## 2012-12-19 ENCOUNTER — Telehealth: Payer: Self-pay | Admitting: *Deleted

## 2012-12-19 VITALS — BP 102/42 | HR 67 | Temp 97.6°F

## 2012-12-19 DIAGNOSIS — D509 Iron deficiency anemia, unspecified: Secondary | ICD-10-CM

## 2012-12-19 DIAGNOSIS — D638 Anemia in other chronic diseases classified elsewhere: Secondary | ICD-10-CM

## 2012-12-19 LAB — CBC WITH DIFFERENTIAL/PLATELET
Eosinophils Absolute: 0.2 10*3/uL (ref 0.0–0.5)
HCT: 30.8 % — ABNORMAL LOW (ref 34.8–46.6)
LYMPH%: 13.2 % — ABNORMAL LOW (ref 14.0–49.7)
MCHC: 32.5 g/dL (ref 31.5–36.0)
MCV: 84.5 fL (ref 79.5–101.0)
MONO%: 17 % — ABNORMAL HIGH (ref 0.0–14.0)
NEUT#: 2.9 10*3/uL (ref 1.5–6.5)
NEUT%: 64.3 % (ref 38.4–76.8)
Platelets: 349 10*3/uL (ref 145–400)
RBC: 3.64 10*6/uL — ABNORMAL LOW (ref 3.70–5.45)

## 2012-12-19 LAB — BASIC METABOLIC PANEL (CC13)
Calcium: 8.4 mg/dL (ref 8.4–10.4)
Creatinine: 0.9 mg/dL (ref 0.6–1.1)
Glucose: 84 mg/dl (ref 70–140)
Sodium: 137 mEq/L (ref 136–145)

## 2012-12-19 MED ORDER — DARBEPOETIN ALFA-POLYSORBATE 300 MCG/0.6ML IJ SOLN
300.0000 ug | Freq: Once | INTRAMUSCULAR | Status: AC
  Administered 2012-12-19: 300 ug via SUBCUTANEOUS
  Filled 2012-12-19: qty 0.6

## 2012-12-19 MED ORDER — CYANOCOBALAMIN 1000 MCG/ML IJ SOLN
1000.0000 ug | Freq: Once | INTRAMUSCULAR | Status: AC
  Administered 2012-12-19: 1000 ug via INTRAMUSCULAR

## 2012-12-19 NOTE — Telephone Encounter (Signed)
K 2.8, per Dr Vista Mink pt needs to take 63mq of K-dur x 7 days.  Pt states she already has K-dur at home and she will take 474m x 7 days.  SLJ

## 2012-12-31 ENCOUNTER — Telehealth: Payer: Self-pay | Admitting: *Deleted

## 2012-12-31 ENCOUNTER — Ambulatory Visit (HOSPITAL_BASED_OUTPATIENT_CLINIC_OR_DEPARTMENT_OTHER): Payer: Medicare Other | Admitting: Lab

## 2012-12-31 DIAGNOSIS — D509 Iron deficiency anemia, unspecified: Secondary | ICD-10-CM

## 2012-12-31 LAB — CBC WITH DIFFERENTIAL/PLATELET
BASO%: 0.4 % (ref 0.0–2.0)
EOS%: 2.5 % (ref 0.0–7.0)
HCT: 30.2 % — ABNORMAL LOW (ref 34.8–46.6)
MCH: 27.6 pg (ref 25.1–34.0)
MCHC: 32.2 g/dL (ref 31.5–36.0)
MONO#: 0.5 10*3/uL (ref 0.1–0.9)
NEUT%: 66.5 % (ref 38.4–76.8)
RBC: 3.52 10*6/uL — ABNORMAL LOW (ref 3.70–5.45)
RDW: 19.5 % — ABNORMAL HIGH (ref 11.2–14.5)
WBC: 3 10*3/uL — ABNORMAL LOW (ref 3.9–10.3)
lymph#: 0.4 10*3/uL — ABNORMAL LOW (ref 0.9–3.3)

## 2012-12-31 NOTE — Telephone Encounter (Signed)
Hbg 9.7, pt is aware, per Dr Vista Mink no need for transfusion today. Pt is c/o a lot of diarrhea. Dr Vista Mink recommended that pt contact Dr Mart Piggs at [facility].  She verbalized understanding.  SLJ

## 2012-12-31 NOTE — Telephone Encounter (Signed)
Pt called stating that she is very fatigued and feels she needs blood.  Per Dr. Vista Mink, okay for her to get CBC and type and hold done.  Pt is aware

## 2012-12-31 NOTE — Telephone Encounter (Signed)
erroneous

## 2013-01-22 ENCOUNTER — Other Ambulatory Visit (HOSPITAL_BASED_OUTPATIENT_CLINIC_OR_DEPARTMENT_OTHER): Payer: Medicare Other | Admitting: Lab

## 2013-01-22 ENCOUNTER — Ambulatory Visit (HOSPITAL_BASED_OUTPATIENT_CLINIC_OR_DEPARTMENT_OTHER): Payer: Medicare Other

## 2013-01-22 VITALS — BP 104/37 | HR 61 | Temp 97.3°F

## 2013-01-22 DIAGNOSIS — D638 Anemia in other chronic diseases classified elsewhere: Secondary | ICD-10-CM

## 2013-01-22 DIAGNOSIS — K912 Postsurgical malabsorption, not elsewhere classified: Secondary | ICD-10-CM

## 2013-01-22 DIAGNOSIS — Z9049 Acquired absence of other specified parts of digestive tract: Secondary | ICD-10-CM

## 2013-01-22 LAB — CBC WITH DIFFERENTIAL/PLATELET
Basophils Absolute: 0 10*3/uL (ref 0.0–0.1)
EOS%: 5.1 % (ref 0.0–7.0)
Eosinophils Absolute: 0.2 10*3/uL (ref 0.0–0.5)
HCT: 29.2 % — ABNORMAL LOW (ref 34.8–46.6)
HGB: 9.2 g/dL — ABNORMAL LOW (ref 11.6–15.9)
MONO#: 0.7 10*3/uL (ref 0.1–0.9)
NEUT#: 1.8 10*3/uL (ref 1.5–6.5)
NEUT%: 51.3 % (ref 38.4–76.8)
RDW: 19.9 % — ABNORMAL HIGH (ref 11.2–14.5)
WBC: 3.5 10*3/uL — ABNORMAL LOW (ref 3.9–10.3)
lymph#: 0.8 10*3/uL — ABNORMAL LOW (ref 0.9–3.3)

## 2013-01-22 MED ORDER — DARBEPOETIN ALFA-POLYSORBATE 300 MCG/0.6ML IJ SOLN
300.0000 ug | Freq: Once | INTRAMUSCULAR | Status: AC
  Administered 2013-01-22: 300 ug via SUBCUTANEOUS
  Filled 2013-01-22: qty 0.6

## 2013-01-22 MED ORDER — CYANOCOBALAMIN 1000 MCG/ML IJ SOLN
1000.0000 ug | Freq: Once | INTRAMUSCULAR | Status: AC
  Administered 2013-01-22: 1000 ug via INTRAMUSCULAR

## 2013-02-19 ENCOUNTER — Ambulatory Visit (HOSPITAL_BASED_OUTPATIENT_CLINIC_OR_DEPARTMENT_OTHER): Payer: Medicare Other

## 2013-02-19 ENCOUNTER — Ambulatory Visit: Payer: Medicare Other

## 2013-02-19 ENCOUNTER — Other Ambulatory Visit (HOSPITAL_BASED_OUTPATIENT_CLINIC_OR_DEPARTMENT_OTHER): Payer: Medicare Other | Admitting: Lab

## 2013-02-19 ENCOUNTER — Ambulatory Visit (HOSPITAL_COMMUNITY)
Admission: RE | Admit: 2013-02-19 | Discharge: 2013-02-19 | Disposition: A | Discharge status: Home / Self Care | Payer: Medicare Other | Source: Ambulatory Visit | Attending: Internal Medicine | Admitting: Internal Medicine

## 2013-02-19 ENCOUNTER — Ambulatory Visit: Payer: BC Managed Care – PPO | Admitting: Lab

## 2013-02-19 VITALS — BP 102/60 | HR 62 | Temp 97.7°F

## 2013-02-19 DIAGNOSIS — D638 Anemia in other chronic diseases classified elsewhere: Secondary | ICD-10-CM

## 2013-02-19 DIAGNOSIS — D509 Iron deficiency anemia, unspecified: Secondary | ICD-10-CM

## 2013-02-19 LAB — CBC WITH DIFFERENTIAL/PLATELET
Basophils Absolute: 0 10*3/uL (ref 0.0–0.1)
EOS%: 1.8 % (ref 0.0–7.0)
Eosinophils Absolute: 0.1 10*3/uL (ref 0.0–0.5)
HCT: 24.5 % — ABNORMAL LOW (ref 34.8–46.6)
HGB: 7.7 g/dL — ABNORMAL LOW (ref 11.6–15.9)
MCH: 27.7 pg (ref 25.1–34.0)
MCV: 87.7 fL (ref 79.5–101.0)
MONO%: 15.2 % — ABNORMAL HIGH (ref 0.0–14.0)
NEUT#: 3.7 10*3/uL (ref 1.5–6.5)
NEUT%: 73 % (ref 38.4–76.8)
RDW: 19.5 % — ABNORMAL HIGH (ref 11.2–14.5)

## 2013-02-19 LAB — PREPARE RBC (CROSSMATCH)

## 2013-02-19 LAB — CBC AND DIFFERENTIAL: WBC: 5 10^3/mL

## 2013-02-19 LAB — HOLD TUBE, BLOOD BANK

## 2013-02-19 MED ORDER — CYANOCOBALAMIN 1000 MCG/ML IJ SOLN
1000.0000 ug | Freq: Once | INTRAMUSCULAR | Status: AC
  Administered 2013-02-19: 1000 ug via INTRAMUSCULAR

## 2013-02-19 MED ORDER — DARBEPOETIN ALFA-POLYSORBATE 300 MCG/0.6ML IJ SOLN
300.0000 ug | Freq: Once | INTRAMUSCULAR | Status: AC
  Administered 2013-02-19: 300 ug via SUBCUTANEOUS
  Filled 2013-02-19: qty 0.6

## 2013-02-19 MED ORDER — CYANOCOBALAMIN 1000 MCG/ML IJ SOLN
INTRAMUSCULAR | Status: AC
  Filled 2013-02-19: qty 1

## 2013-02-19 NOTE — Progress Notes (Signed)
hbg 7.7, 2 unit arranged for this week.  SLJ

## 2013-02-19 NOTE — Progress Notes (Signed)
Quick Note:  Call patient with the result and she needs 2 units of PRBCs ______

## 2013-02-20 ENCOUNTER — Ambulatory Visit: Payer: BC Managed Care – PPO

## 2013-02-20 ENCOUNTER — Encounter: Payer: Self-pay | Admitting: Medical Oncology

## 2013-02-20 VITALS — BP 99/57 | HR 73 | Temp 100.8°F | Resp 18

## 2013-02-20 DIAGNOSIS — D638 Anemia in other chronic diseases classified elsewhere: Secondary | ICD-10-CM

## 2013-02-20 MED ORDER — ACETAMINOPHEN 325 MG PO TABS
ORAL_TABLET | ORAL | Status: AC
  Filled 2013-02-20: qty 2

## 2013-02-20 MED ORDER — ACETAMINOPHEN 325 MG PO TABS
650.0000 mg | ORAL_TABLET | Freq: Once | ORAL | Status: AC
  Administered 2013-02-20: 650 mg via ORAL

## 2013-02-20 MED ORDER — ACETAMINOPHEN 325 MG PO TABS: 650.0000 mg | ORAL_TABLET | Freq: Once | ORAL | Status: DC

## 2013-02-20 MED ORDER — SODIUM CHLORIDE 0.9 % IV SOLN: 250.0000 mL | Freq: Once | INTRAVENOUS | Status: DC

## 2013-02-20 MED ORDER — DIPHENHYDRAMINE HCL 25 MG PO CAPS: 25.0000 mg | ORAL_CAPSULE | Freq: Once | ORAL | Status: DC

## 2013-02-20 NOTE — Patient Instructions (Addendum)
Blood Transfusion Information WHAT IS A BLOOD TRANSFUSION? A transfusion is the replacement of blood or some of its parts. Blood is made up of multiple cells which provide different functions.  Red blood cells carry oxygen and are used for blood loss replacement.  White blood cells fight against infection.  Platelets control bleeding.  Plasma helps clot blood.  Other blood products are available for specialized needs, such as hemophilia or other clotting disorders. BEFORE THE TRANSFUSION  Who gives blood for transfusions?   You may be able to donate blood to be used at a later date on yourself (autologous donation).  Relatives can be asked to donate blood. This is generally not any safer than if you have received blood from a stranger. The same precautions are taken to ensure safety when a relative's blood is donated.  Healthy volunteers who are fully evaluated to make sure their blood is safe. This is blood bank blood. Transfusion therapy is the safest it has ever been in the practice of medicine. Before blood is taken from a donor, a complete history is taken to make sure that person has no history of diseases nor engages in risky social behavior (examples are intravenous drug use or sexual activity with multiple partners). The donor's travel history is screened to minimize risk of transmitting infections, such as malaria. The donated blood is tested for signs of infectious diseases, such as HIV and hepatitis. The blood is then tested to be sure it is compatible with you in order to minimize the chance of a transfusion reaction. If you or a relative donates blood, this is often done in anticipation of surgery and is not appropriate for emergency situations. It takes many days to process the donated blood. RISKS AND COMPLICATIONS Although transfusion therapy is very safe and saves many lives, the main dangers of transfusion include:   Getting an infectious disease.  Developing a  transfusion reaction. This is an allergic reaction to something in the blood you were given. Every precaution is taken to prevent this. The decision to have a blood transfusion has been considered carefully by your caregiver before blood is given. Blood is not given unless the benefits outweigh the risks. AFTER THE TRANSFUSION  Right after receiving a blood transfusion, you will usually feel much better and more energetic. This is especially true if your red blood cells have gotten low (anemic). The transfusion raises the level of the red blood cells which carry oxygen, and this usually causes an energy increase.  The nurse administering the transfusion will monitor you carefully for complications. HOME CARE INSTRUCTIONS  No special instructions are needed after a transfusion. You may find your energy is better. Speak with your caregiver about any limitations on activity for underlying diseases you may have. SEEK MEDICAL CARE IF:   Your condition is not improving after your transfusion.  You develop redness or irritation at the intravenous (IV) site. SEEK IMMEDIATE MEDICAL CARE IF:  Any of the following symptoms occur over the next 12 hours:  Shaking chills.  You have a temperature by mouth above 102 F (38.9 C), not controlled by medicine.  Chest, back, or muscle pain.  People around you feel you are not acting correctly or are confused.  Shortness of breath or difficulty breathing.  Dizziness and fainting.  You get a rash or develop hives.  You have a decrease in urine output.  Your urine turns a dark color or changes to pink, red, or brown. Any of the following   symptoms occur over the next 10 days:  You have a temperature by mouth above 102 F (38.9 C), not controlled by medicine.  Shortness of breath.  Weakness after normal activity.  The white part of the eye turns yellow (jaundice).  You have a decrease in the amount of urine or are urinating less often.  Your  urine turns a dark color or changes to pink, red, or brown. Document Released: 05/20/2000 Document Revised: 08/15/2011 Document Reviewed: 01/07/2008 Exit[employer] Patient Information 2014 [employer].  

## 2013-02-21 ENCOUNTER — Ambulatory Visit: Payer: BC Managed Care – PPO

## 2013-02-21 VITALS — BP 107/66 | HR 63 | Temp 97.5°F | Resp 20

## 2013-02-21 DIAGNOSIS — D638 Anemia in other chronic diseases classified elsewhere: Secondary | ICD-10-CM

## 2013-02-21 MED ORDER — SODIUM CHLORIDE 0.9 % IV SOLN: 250.0000 mL | Freq: Once | INTRAVENOUS | Status: DC

## 2013-02-21 NOTE — Patient Instructions (Signed)
Blood Transfusion  A blood transfusion replaces your blood or some of its parts. Blood is replaced when you have lost blood because of surgery, an accident, or for severe blood conditions like anemia. You can donate blood to be used on yourself if you have a planned surgery. If you lose blood during that surgery, your own blood can be given back to you. Any blood given to you is checked to make sure it matches your blood type. Your temperature, blood pressure, and heart rate (vital signs) will be checked often.  GET HELP RIGHT AWAY IF:   You feel sick to your stomach (nauseous) or throw up (vomit).  You have watery poop (diarrhea).  You have shortness of breath or trouble breathing.  You have blood in your pee (urine) or have dark colored pee.  You have chest pain or tightness.  Your eyes or skin turn yellow (jaundice).  You have a temperature by mouth above 102 F (38.9 C), not controlled by medicine.  You start to shake and have chills.  You develop a a red rash (hives) or feel itchy.  You develop lightheadedness or feel confused.  You develop back, joint, or muscle pain.  You do not feel hungry (lost appetite).  You feel tired, restless, or nervous.  You develop belly (abdominal) cramps. Document Released: 08/19/2008 Document Revised: 08/15/2011 Document Reviewed: 08/19/2008 [facility] Patient Information 2014 [place], [place].

## 2013-02-22 LAB — TYPE AND SCREEN
ABO/RH(D): AB POS
Unit division: 0

## 2013-03-18 ENCOUNTER — Ambulatory Visit (INDEPENDENT_AMBULATORY_CARE_PROVIDER_SITE_OTHER): Payer: Medicare Other | Admitting: Internal Medicine

## 2013-03-18 ENCOUNTER — Encounter: Payer: Self-pay | Admitting: Internal Medicine

## 2013-03-18 VITALS — BP 110/68 | HR 63 | Temp 97.8°F | Ht <= 58 in | Wt 78.4 lb

## 2013-03-18 DIAGNOSIS — K5080 Crohn's disease of both small and large intestine without complications: Secondary | ICD-10-CM

## 2013-03-18 DIAGNOSIS — D638 Anemia in other chronic diseases classified elsewhere: Secondary | ICD-10-CM

## 2013-03-18 DIAGNOSIS — H409 Unspecified glaucoma: Secondary | ICD-10-CM | POA: Insufficient documentation

## 2013-03-18 DIAGNOSIS — M1990 Unspecified osteoarthritis, unspecified site: Secondary | ICD-10-CM | POA: Insufficient documentation

## 2013-03-18 DIAGNOSIS — E876 Hypokalemia: Secondary | ICD-10-CM

## 2013-03-18 DIAGNOSIS — M810 Age-related osteoporosis without current pathological fracture: Secondary | ICD-10-CM | POA: Insufficient documentation

## 2013-03-18 DIAGNOSIS — K50813 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: Secondary | ICD-10-CM

## 2013-03-18 MED ORDER — MELATONIN 10 MG PO TABS: 10.0000 mg | ORAL_TABLET | Freq: Every day | ORAL | Status: DC

## 2013-03-18 MED ORDER — POTASSIUM CHLORIDE CRYS ER 20 MEQ PO TBCR: 20.0000 meq | EXTENDED_RELEASE_TABLET | Freq: Two times a day (BID) | ORAL | Status: DC

## 2013-03-18 MED ORDER — CIPROFLOXACIN HCL 500 MG PO TABS: 500.0000 mg | ORAL_TABLET | Freq: Two times a day (BID) | ORAL | Status: DC

## 2013-03-18 MED ORDER — ADALIMUMAB 40 MG/0.8ML ~~LOC~~ KIT: 40.0000 mg | PACK | SUBCUTANEOUS | Status: DC

## 2013-03-18 MED ORDER — HYDROCORTISONE ACETATE 10 % RE FOAM: RECTAL | Status: DC

## 2013-03-18 NOTE — Assessment & Plan Note (Signed)
Denies history of fracture, but severe disease by DEXA report June 2013 (-2.8 fem, normal L spine) Previously on Fosamax, discontinued in 2012 because of dental and jaw problems Follows with endocrinology at [facility], next DEXA June 2015 as planned The current medical regimen is effective;  continue present plan and medications.

## 2013-03-18 NOTE — Assessment & Plan Note (Signed)
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 Inda Merlin The current medical regimen is effective;  continue present plan and medications.

## 2013-03-18 NOTE — Progress Notes (Signed)
Subjective:    Patient ID: Kim [name], female    DOB: 1940-12-13, 72 y.o.   MRN: [id number]  HPI New patient to me, here to establish care with primary care provider Also follows locally with hematology and at [facility] for dermatology and GI needs  Reviewed chronic medical issues today:  Crohn's disease. Diagnosed in her 45s. Ileostomy with internal pouch age 24. Maintained on Humira therapy every 2 weeks. Follows with GI specialist at [facility]. Considering Entyvio therapy to begin his monthly infusion November 2014 pending insurance approval. On Cipro for recent pouchitis, no other flares or complications. Last ileoscopy December 2012 at [facility], status post pouch stricture dilation in [place] December 2013  Malabsorption with chronic anemia. Iron deficiency and B12 deficiency related to Crohn's disease. Follows with local hematology for same. CBC and every 4 weeks with transfusion and/or iron infusion -also monthly Aransep -monthly B12 injections  Osteoporosis -on Fosamax for years, discontinued in 2012 due to jaw/dental problems -follows at [facility] for same, reports last DEXA June 2013. Denies history fracture  Past Medical History  Diagnosis Date  . Osteoporosis     managed by Dr. Nevada Crane at [facility], [place] q2 yrs  . OA (osteoarthritis)     hands,back  . Crohn disease     Dr. Emelda Fear at [facility]  . Glaucoma     Dr. Janyth Contes  . Intestinal malabsorption   . Anemia     related to malabsorption--gets B12 shots and iron infusions, managed by Dr. Julien Nordmann  . BCC (basal cell carcinoma of skin)    Family History  Problem Relation Age of Onset  . Diabetes Mother     borderline  . Heart disease Mother     MI  . Heart disease Father   . Thyroid disease Sister    History  Substance Use Topics  . Smoking status: Former Smoker    Quit date: 06/06/1972  . Smokeless tobacco: Never Used  . Alcohol Use: No    Review of Systems Constitutional: Negative for fever or weight change.  Respiratory: Negative for  cough and shortness of breath.   Cardiovascular: Negative for chest pain or palpitations.  Gastrointestinal: Negative for abdominal pain, no bowel changes.  Musculoskeletal: Negative for gait problem or joint swelling.  Skin: Negative for rash.  Neurological: Negative for dizziness or headache.  No other specific complaints in a complete review of systems (except as listed in HPI above).     Objective:   Physical Exam BP 110/68  Pulse 63  Temp(Src) 97.8 F (36.6 C) (Oral)  Ht 4' 9"  (1.448 m)  Wt 78 lb 6.4 oz (35.562 kg)  BMI 16.96 kg/m2  SpO2 96% Wt Readings from Last 3 Encounters:  03/18/13 78 lb 6.4 oz (35.562 kg)  12/12/12 75 lb (34.02 kg)  09/06/12 73 lb 9.6 oz (33.385 kg)   Constitutional: She is petite and underweight but appears well-developed. No distress.  HENT: Head: Normocephalic and atraumatic. Ears: B TMs ok, no erythema or effusion; Nose: Nose normal. Mouth/Throat: Oropharynx is clear and moist. No oropharyngeal exudate.  Eyes: Conjunctivae and EOM are normal. Pupils are equal, round, and reactive to light. No scleral icterus.  Neck: Normal range of motion. Neck supple. No JVD present. No thyromegaly present.  Cardiovascular: Normal rate, regular rhythm and normal heart sounds.  No murmur heard. No BLE edema. Pulmonary/Chest: Effort normal and breath sounds normal. No respiratory distress. She has no wheezes.  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 Well-healed midline scar with internal ileostomy pouch. Soft. Bowel sounds are normal. She exhibits no distension. There is no tenderness. no masses Musculoskeletal: senile OA changes bilateral hands. Normal range of motion, no joint effusions. No gross deformities Neurological: She is alert and oriented to person, place, and time. No cranial nerve deficit. Coordination, balance, strength, speech and gait are normal.  Skin: pale. Skin is warm and dry. No rash noted. No  erythema.  Psychiatric: She has a minimally anxious mood and affect. Her behavior is normal. Judgment and thought content normal.   Lab Results  Component Value Date   WBC 5.0 02/19/2013   HGB 7.7* 02/19/2013   HCT 24.5* 02/19/2013   PLT 410* 02/19/2013   GLUCOSE 84 12/19/2012   CHOL 127 08/22/2011   TRIG 138 08/22/2011   HDL 35* 08/22/2011   LDLCALC 64 08/22/2011   ALT 11 09/05/2011   AST 16 09/05/2011   NA 137 12/19/2012   K 2.8 Repeated and Verified* 12/19/2012   CL 101 09/05/2011   CREATININE 0.9 12/19/2012   BUN 24.5 12/19/2012   CO2 21* 12/19/2012   TSH 4.162 08/22/2011       Assessment & Plan:   See problem list. Medications and labs reviewed today.  Time spent with pt today 45 minutes, greater than 50% time spent counseling patient on Crohn's disease, chronic anemia related to malabsorption and medication review. Also review of prior records

## 2013-03-18 NOTE — Addendum Note (Signed)
Addended by: [name] A on: 03/18/2013 11:08 AM   Modules accepted: Orders

## 2013-03-18 NOTE — Patient Instructions (Signed)
It was good to see you today. We have reviewed your prior records including labs and tests today Medications reviewed and updated, no changes recommended at this time. Please schedule followup in 1 year for annual exam/labs, call sooner if problems.

## 2013-03-18 NOTE — Assessment & Plan Note (Signed)
Severe disease, follows at [facility] for management of same Humira injection every 2 weeks Considering Entyvio therapy to begin November 2014 per patient report Status post colonectomy 1984 related to same, internal ileostomy with pouch reviewed Stricture dilation of pouch fistula December 2013 in [place] per patient report. On Cipro at this time for pouchitis The current medical regimen is effective;  continue present plan and medications.

## 2013-03-19 ENCOUNTER — Ambulatory Visit (HOSPITAL_BASED_OUTPATIENT_CLINIC_OR_DEPARTMENT_OTHER): Payer: Medicare Other

## 2013-03-19 ENCOUNTER — Other Ambulatory Visit (HOSPITAL_BASED_OUTPATIENT_CLINIC_OR_DEPARTMENT_OTHER): Payer: Medicare Other | Admitting: Lab

## 2013-03-19 ENCOUNTER — Encounter: Payer: Self-pay | Admitting: Internal Medicine

## 2013-03-19 ENCOUNTER — Ambulatory Visit (HOSPITAL_BASED_OUTPATIENT_CLINIC_OR_DEPARTMENT_OTHER): Payer: Medicare Other | Admitting: Lab

## 2013-03-19 ENCOUNTER — Ambulatory Visit (HOSPITAL_BASED_OUTPATIENT_CLINIC_OR_DEPARTMENT_OTHER): Payer: Medicare Other | Admitting: Internal Medicine

## 2013-03-19 ENCOUNTER — Ambulatory Visit: Payer: Medicare Other | Admitting: Internal Medicine

## 2013-03-19 ENCOUNTER — Telehealth: Payer: Self-pay | Admitting: Internal Medicine

## 2013-03-19 ENCOUNTER — Other Ambulatory Visit: Payer: Medicare Other | Admitting: Lab

## 2013-03-19 VITALS — BP 119/65 | HR 60 | Temp 97.0°F | Resp 20 | Ht <= 58 in | Wt 78.4 lb

## 2013-03-19 DIAGNOSIS — D509 Iron deficiency anemia, unspecified: Secondary | ICD-10-CM

## 2013-03-19 DIAGNOSIS — D508 Other iron deficiency anemias: Secondary | ICD-10-CM

## 2013-03-19 DIAGNOSIS — D638 Anemia in other chronic diseases classified elsewhere: Secondary | ICD-10-CM

## 2013-03-19 DIAGNOSIS — K5090 Crohn's disease, unspecified, without complications: Secondary | ICD-10-CM

## 2013-03-19 LAB — IRON AND TIBC CHCC: TIBC: 245 ug/dL (ref 236–444)

## 2013-03-19 LAB — CBC WITH DIFFERENTIAL/PLATELET
BASO%: 0.4 % (ref 0.0–2.0)
EOS%: 1 % (ref 0.0–7.0)
HCT: 30 % — ABNORMAL LOW (ref 34.8–46.6)
LYMPH%: 15.1 % (ref 14.0–49.7)
MCH: 27.9 pg (ref 25.1–34.0)
MCHC: 32.5 g/dL (ref 31.5–36.0)
MONO%: 15.5 % — ABNORMAL HIGH (ref 0.0–14.0)
NEUT%: 68 % (ref 38.4–76.8)
lymph#: 0.9 10*3/uL (ref 0.9–3.3)

## 2013-03-19 MED ORDER — DARBEPOETIN ALFA-POLYSORBATE 300 MCG/0.6ML IJ SOLN
300.0000 ug | Freq: Once | INTRAMUSCULAR | Status: AC
  Administered 2013-03-19: 300 ug via SUBCUTANEOUS
  Filled 2013-03-19: qty 0.6

## 2013-03-19 MED ORDER — CYANOCOBALAMIN 1000 MCG/ML IJ SOLN
1000.0000 ug | Freq: Once | INTRAMUSCULAR | Status: AC
  Administered 2013-03-19: 1000 ug via INTRAMUSCULAR

## 2013-03-19 NOTE — Patient Instructions (Signed)
Follow up visit in 3 months with repeat CBC, iron study and ferritin

## 2013-03-19 NOTE — Progress Notes (Signed)
[place] [phone]   [phone]  OFFICE PROGRESS NOTE  [name], MD [address] 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   PRIOR THERAPY:  1) PRBCs transfusion as needed last was given few days ago.   CURRENT THERAPY:  1) Intravenous Feraheme infusion on as-needed basis. Last dose was given on 12/20/2011, and the patient is scheduled for another treatment tomorrow.  2) Aranesp 300 mcg subcutaneously every 4 weeks for anemia of chronic disease.  3) vitamin B12 injection on monthly basis.  INTERVAL HISTORY: Kim [name] 72 y.o. female returns to the clinic today for routine three-month followup visit. The patient is doing fine today except for mild fatigue. She recently received 2 units of PRBCs transfusion after her hemoglobin was down to 7.7 g/dL on 02/19/2013. She still have some bruising of blood around the ostomy. She is tolerating her treatment with Aranesp and vitamin B12 injection fairly well. She is expected to start treatment with Vedolizumab under the care of Dr. Emelda Fear at [facility] very soon for her history of Crohn's disease. The patient was seen recently by Dr. Asa Lente as a new primary care physician and she is very happy with her. She is also considering seeing Dr. Fuller Plan as a local gastroenterologist. The patient had repeat CBC and iron study performed today and she is here for evaluation and discussion of her lab results. She denied having any significant chest pain, shortness of breath, cough or hemoptysis.  MEDICAL HISTORY: Past Medical History  Diagnosis Date  . Osteoporosis     managed by Dr. Nevada Crane at [facility], [place] q2 yrs  . OA (osteoarthritis)     hands,back  . Crohn disease     Dr. Emelda Fear at [facility]  . Glaucoma     Dr. Janyth Contes  . Intestinal malabsorption   . Anemia     related to  malabsorption--gets B12 shots and iron infusions, managed by Dr. Julien Nordmann  . BCC (basal cell carcinoma of skin)     ALLERGIES:  is allergic to antihistamines, chlorpheniramine-type; metronidazole; and sulfa antibiotics.  MEDICATIONS:  Current Outpatient Prescriptions  Medication Sig Dispense Refill  . adalimumab (HUMIRA) 40 MG/0.8ML injection Inject 0.8 mLs (40 mg total) into the skin every 14 (fourteen) days.  2 each  0  . ciprofloxacin (CIPRO) 500 MG tablet Take 1 tablet (500 mg total) by mouth 2 (two) times daily.      . darbepoetin (ARANESP) 300 MCG/0.6ML SOLN Inject 300 mcg into the skin every 21 ( twenty-one) days.      . dorzolamide-timolol (COSOPT) 22.3-6.8 MG/ML ophthalmic solution 1 drop daily.        . hydrocortisone (CORTIFOAM) 90 MG/DOSE rectal foam Applied to stoma 2 times daily  15 g  0  . latanoprost (XALATAN) 0.005 % ophthalmic solution Apply 1 drop to eye daily. Place into both eyes Daily.      . Melatonin 10 MG TABS Take 10 mg by mouth at bedtime.      . potassium chloride SA (K-DUR,KLOR-CON) 20 MEQ tablet Take 1 tablet (20 mEq total) by mouth 2 (two) times daily.  30 tablet  3  . vitamin B-12 (CYANOCOBALAMIN) 1000 MCG tablet Inject 1,000 mcg as directed every 30 (thirty) days.       . [DISCONTINUED] potassium chloride 40 MEQ/15ML (20%) LIQD Take 15 mLs (40 mEq total)  by mouth as directed.  150 mL  0   No current facility-administered medications for this visit.   Facility-Administered Medications Ordered in Other Visits  Medication Dose Route Frequency Provider Last Rate Last Dose  . acetaminophen (TYLENOL) tablet 650 mg  650 mg Oral Once Adrena E Johnson, PA-C      . cyanocobalamin ((VITAMIN B-12)) injection 1,000 mcg  1,000 mcg Intramuscular Once Curt Bears, MD      . darbepoetin Kyra Searles) injection 300 mcg  300 mcg Subcutaneous Once Curt Bears, MD      . diphenhydrAMINE (BENADRYL) capsule 25 mg  25 mg Oral Once Carlton Adam, PA-C        SURGICAL  HISTORY:  Past Surgical History  Procedure Laterality Date  . Total colectomy  1984    due to Crohn's  . Internal ostomy pouch  1994  . Crohn  2009    REVIEW OF SYSTEMS:  A comprehensive review of systems was negative except for: Constitutional: positive for fatigue   PHYSICAL EXAMINATION: General appearance: alert, cooperative, fatigued and no distress Head: Normocephalic, without obvious abnormality, atraumatic Neck: no adenopathy, no JVD, supple, symmetrical, trachea midline and thyroid not enlarged, symmetric, no tenderness/mass/nodules Lymph nodes: Cervical, supraclavicular, and axillary nodes normal. Resp: clear to auscultation bilaterally Back: symmetric, no curvature. ROM normal. No CVA tenderness. Cardio: regular rate and rhythm, S1, S2 normal, no murmur, click, rub or gallop GI: soft, non-tender; bowel sounds normal; no masses,  no organomegaly Extremities: extremities normal, atraumatic, no cyanosis or edema  ECOG PERFORMANCE STATUS: 1 - Symptomatic but completely ambulatory  Blood pressure 119/65, pulse 60, temperature 97 F (36.1 C), temperature source Oral, resp. rate 20, height 4' 9"  (1.448 m), weight 78 lb 6.4 oz (35.562 kg).  LABORATORY DATA: Lab Results  Component Value Date   WBC 5.9 03/19/2013   HGB 9.7* 03/19/2013   HCT 30.0* 03/19/2013   MCV 86.0 03/19/2013   PLT 288 03/19/2013      Chemistry      Component Value Date/Time   NA 137 12/19/2012 1058   NA 136 09/05/2011 1558   K 2.8 Repeated and Verified* 12/19/2012 1058   K 3.0* 09/05/2011 1558   CL 101 09/05/2011 1558   CO2 21* 12/19/2012 1058   CO2 27 09/05/2011 1558   BUN 24.5 12/19/2012 1058   BUN 34* 09/05/2011 1558   CREATININE 0.9 12/19/2012 1058   CREATININE 1.13* 09/05/2011 1558   CREATININE 1.04 08/22/2011 1106      Component Value Date/Time   CALCIUM 8.4 12/19/2012 1058   CALCIUM 8.8 09/05/2011 1558   ALKPHOS 44 09/05/2011 1558   AST 16 09/05/2011 1558   ALT 11 09/05/2011 1558   BILITOT 0.2* 09/05/2011 1558        RADIOGRAPHIC STUDIES: No results found.  ASSESSMENT AND PLAN: this is a very pleasant 72 years old white female with history of iron deficiency anemia/anemia of chronic disease secondary to malabsorption and Crohn's disease. The patient is tolerating her current treatment with Aranesp and vitamin B12 fairly well. Her iron study today is unremarkable. Her hemoglobin and hematocrit are stable compared to the prior levels. I recommended for the patient to continue her current treatment with Aranesp and vitamin B12 every 4 weeks. I would see her back for follow up visit in 3 months with repeat CBC and iron study She was advised to call immediately if she has any concerning symptoms in the interval. The patient will continue her treatment with intravenous Vedolizumab  under the care of Dr. Emelda Fear at [facility]. She may also need to establish with a local gastroenterologist in case she would like to receive this treatment locally as he will be able to monitor her condition close to home in collaboration with Dr. Emelda Fear. The patient voices understanding of current disease status and treatment options and is in agreement with the current care plan.  All questions were answered. The patient knows to call the clinic with any problems, questions or concerns. We can certainly see the patient much sooner if necessary.

## 2013-03-19 NOTE — Telephone Encounter (Signed)
gv and printed appt sched and avs for pt for NOV thru March 2015   sent  pt to lab

## 2013-04-16 ENCOUNTER — Ambulatory Visit (HOSPITAL_BASED_OUTPATIENT_CLINIC_OR_DEPARTMENT_OTHER): Payer: Medicare Other

## 2013-04-16 ENCOUNTER — Other Ambulatory Visit: Payer: Self-pay | Admitting: *Deleted

## 2013-04-16 ENCOUNTER — Other Ambulatory Visit (HOSPITAL_BASED_OUTPATIENT_CLINIC_OR_DEPARTMENT_OTHER): Payer: Medicare Other | Admitting: Lab

## 2013-04-16 ENCOUNTER — Ambulatory Visit (HOSPITAL_COMMUNITY)
Admission: RE | Admit: 2013-04-16 | Discharge: 2013-04-16 | Disposition: A | Discharge status: Home / Self Care | Payer: Medicare Other | Source: Ambulatory Visit | Attending: Internal Medicine | Admitting: Internal Medicine

## 2013-04-16 ENCOUNTER — Telehealth: Payer: Self-pay | Admitting: Internal Medicine

## 2013-04-16 VITALS — BP 95/57 | HR 67 | Temp 96.4°F | Resp 16

## 2013-04-16 DIAGNOSIS — D509 Iron deficiency anemia, unspecified: Secondary | ICD-10-CM

## 2013-04-16 DIAGNOSIS — D508 Other iron deficiency anemias: Secondary | ICD-10-CM

## 2013-04-16 DIAGNOSIS — D638 Anemia in other chronic diseases classified elsewhere: Secondary | ICD-10-CM

## 2013-04-16 DIAGNOSIS — D649 Anemia, unspecified: Secondary | ICD-10-CM

## 2013-04-16 DIAGNOSIS — K909 Intestinal malabsorption, unspecified: Secondary | ICD-10-CM | POA: Insufficient documentation

## 2013-04-16 DIAGNOSIS — Z9049 Acquired absence of other specified parts of digestive tract: Secondary | ICD-10-CM | POA: Insufficient documentation

## 2013-04-16 DIAGNOSIS — K5090 Crohn's disease, unspecified, without complications: Secondary | ICD-10-CM | POA: Insufficient documentation

## 2013-04-16 LAB — CBC WITH DIFFERENTIAL/PLATELET
Basophils Absolute: 0 10*3/uL (ref 0.0–0.1)
Eosinophils Absolute: 0.1 10*3/uL (ref 0.0–0.5)
HCT: 28.1 % — ABNORMAL LOW (ref 34.8–46.6)
HGB: 8.8 g/dL — ABNORMAL LOW (ref 11.6–15.9)
LYMPH%: 14 % (ref 14.0–49.7)
MCV: 84.2 fL (ref 79.5–101.0)
MONO%: 22 % — ABNORMAL HIGH (ref 0.0–14.0)
NEUT#: 2.1 10*3/uL (ref 1.5–6.5)
NEUT%: 61.8 % (ref 38.4–76.8)
Platelets: 297 10*3/uL (ref 145–400)

## 2013-04-16 MED ORDER — DARBEPOETIN ALFA-POLYSORBATE 300 MCG/0.6ML IJ SOLN
300.0000 ug | Freq: Once | INTRAMUSCULAR | Status: AC
  Administered 2013-04-16: 300 ug via SUBCUTANEOUS
  Filled 2013-04-16: qty 0.6

## 2013-04-16 MED ORDER — CYANOCOBALAMIN 1000 MCG/ML IJ SOLN
1000.0000 ug | Freq: Once | INTRAMUSCULAR | Status: AC
  Administered 2013-04-16: 1000 ug via INTRAMUSCULAR

## 2013-04-16 NOTE — Telephone Encounter (Signed)
gv and printed appt sched and avs for pt for NOV

## 2013-04-16 NOTE — Progress Notes (Signed)
Hbg 8.8, pt states she is symptomatic.  Per Dr Vista Mink, okay to give 2 units PRBC's.  SLJ

## 2013-04-17 ENCOUNTER — Ambulatory Visit (HOSPITAL_BASED_OUTPATIENT_CLINIC_OR_DEPARTMENT_OTHER): Payer: Medicare Other

## 2013-04-17 VITALS — BP 104/58 | HR 77 | Temp 98.5°F | Resp 18

## 2013-04-17 DIAGNOSIS — D649 Anemia, unspecified: Secondary | ICD-10-CM

## 2013-04-17 MED ORDER — SODIUM CHLORIDE 0.9 % IV SOLN
250.0000 mL | Freq: Once | INTRAVENOUS | Status: AC
  Administered 2013-04-17: 250 mL via INTRAVENOUS

## 2013-04-17 MED ORDER — DIPHENHYDRAMINE HCL 25 MG PO CAPS: 25.0000 mg | ORAL_CAPSULE | Freq: Once | ORAL | Status: DC

## 2013-04-17 MED ORDER — ACETAMINOPHEN 325 MG PO TABS: 650.0000 mg | ORAL_TABLET | Freq: Once | ORAL | Status: DC

## 2013-04-17 NOTE — Patient Instructions (Signed)
Blood Transfusion Information WHAT IS A BLOOD TRANSFUSION? A transfusion is the replacement of blood or some of its parts. Blood is made up of multiple cells which provide different functions.  Red blood cells carry oxygen and are used for blood loss replacement.  White blood cells fight against infection.  Platelets control bleeding.  Plasma helps clot blood.  Other blood products are available for specialized needs, such as hemophilia or other clotting disorders. BEFORE THE TRANSFUSION  Who gives blood for transfusions?   You may be able to donate blood to be used at a later date on yourself (autologous donation).  Relatives can be asked to donate blood. This is generally not any safer than if you have received blood from a stranger. The same precautions are taken to ensure safety when a relative's blood is donated.  Healthy volunteers who are fully evaluated to make sure their blood is safe. This is blood bank blood. Transfusion therapy is the safest it has ever been in the practice of medicine. Before blood is taken from a donor, a complete history is taken to make sure that person has no history of diseases nor engages in risky social behavior (examples are intravenous drug use or sexual activity with multiple partners). The donor's travel history is screened to minimize risk of transmitting infections, such as malaria. The donated blood is tested for signs of infectious diseases, such as HIV and hepatitis. The blood is then tested to be sure it is compatible with you in order to minimize the chance of a transfusion reaction. If you or a relative donates blood, this is often done in anticipation of surgery and is not appropriate for emergency situations. It takes many days to process the donated blood. RISKS AND COMPLICATIONS Although transfusion therapy is very safe and saves many lives, the main dangers of transfusion include:   Getting an infectious disease.  Developing a  transfusion reaction. This is an allergic reaction to something in the blood you were given. Every precaution is taken to prevent this. The decision to have a blood transfusion has been considered carefully by your caregiver before blood is given. Blood is not given unless the benefits outweigh the risks. AFTER THE TRANSFUSION  Right after receiving a blood transfusion, you will usually feel much better and more energetic. This is especially true if your red blood cells have gotten low (anemic). The transfusion raises the level of the red blood cells which carry oxygen, and this usually causes an energy increase.  The nurse administering the transfusion will monitor you carefully for complications. HOME CARE INSTRUCTIONS  No special instructions are needed after a transfusion. You may find your energy is better. Speak with your caregiver about any limitations on activity for underlying diseases you may have. SEEK MEDICAL CARE IF:   Your condition is not improving after your transfusion.  You develop redness or irritation at the intravenous (IV) site. SEEK IMMEDIATE MEDICAL CARE IF:  Any of the following symptoms occur over the next 12 hours:  Shaking chills.  You have a temperature by mouth above 102 F (38.9 C), not controlled by medicine.  Chest, back, or muscle pain.  People around you feel you are not acting correctly or are confused.  Shortness of breath or difficulty breathing.  Dizziness and fainting.  You get a rash or develop hives.  You have a decrease in urine output.  Your urine turns a dark color or changes to pink, red, or brown. Any of the following   symptoms occur over the next 10 days:  You have a temperature by mouth above 102 F (38.9 C), not controlled by medicine.  Shortness of breath.  Weakness after normal activity.  The white part of the eye turns yellow (jaundice).  You have a decrease in the amount of urine or are urinating less often.  Your  urine turns a dark color or changes to pink, red, or brown. Document Released: 05/20/2000 Document Revised: 08/15/2011 Document Reviewed: 01/07/2008 ExitCare Patient Information 2014 [employer].  

## 2013-04-18 LAB — TYPE AND SCREEN
Antibody Screen: NEGATIVE
Unit division: 0

## 2013-05-14 ENCOUNTER — Other Ambulatory Visit: Payer: Self-pay | Admitting: Internal Medicine

## 2013-05-14 ENCOUNTER — Ambulatory Visit (HOSPITAL_BASED_OUTPATIENT_CLINIC_OR_DEPARTMENT_OTHER): Payer: Medicare Other

## 2013-05-14 ENCOUNTER — Other Ambulatory Visit (HOSPITAL_BASED_OUTPATIENT_CLINIC_OR_DEPARTMENT_OTHER): Payer: Medicare Other | Admitting: Lab

## 2013-05-14 VITALS — BP 95/53 | HR 63 | Temp 97.5°F

## 2013-05-14 DIAGNOSIS — D508 Other iron deficiency anemias: Secondary | ICD-10-CM

## 2013-05-14 DIAGNOSIS — D638 Anemia in other chronic diseases classified elsewhere: Secondary | ICD-10-CM

## 2013-05-14 DIAGNOSIS — K912 Postsurgical malabsorption, not elsewhere classified: Secondary | ICD-10-CM

## 2013-05-14 LAB — CBC WITH DIFFERENTIAL/PLATELET
Basophils Absolute: 0 10*3/uL (ref 0.0–0.1)
EOS%: 2.1 % (ref 0.0–7.0)
HCT: 36.6 % (ref 34.8–46.6)
HGB: 11.7 g/dL (ref 11.6–15.9)
MCHC: 32.1 g/dL (ref 31.5–36.0)
MONO%: 18.8 % — ABNORMAL HIGH (ref 0.0–14.0)
NEUT#: 3.1 10*3/uL (ref 1.5–6.5)
NEUT%: 67.5 % (ref 38.4–76.8)
Platelets: 346 10*3/uL (ref 145–400)
RBC: 4.3 10*6/uL (ref 3.70–5.45)
RDW: 18.2 % — ABNORMAL HIGH (ref 11.2–14.5)
WBC: 4.6 10*3/uL (ref 3.9–10.3)

## 2013-05-14 LAB — HOLD TUBE, BLOOD BANK

## 2013-05-14 MED ORDER — DARBEPOETIN ALFA-POLYSORBATE 300 MCG/0.6ML IJ SOLN: 300.0000 ug | Freq: Once | INTRAMUSCULAR | Status: DC

## 2013-05-14 MED ORDER — CYANOCOBALAMIN 1000 MCG/ML IJ SOLN
1000.0000 ug | Freq: Once | INTRAMUSCULAR | Status: AC
  Administered 2013-05-14: 1000 ug via INTRAMUSCULAR

## 2013-05-22 ENCOUNTER — Other Ambulatory Visit: Payer: Self-pay

## 2013-05-22 DIAGNOSIS — Z1231 Encounter for screening mammogram for malignant neoplasm of breast: Secondary | ICD-10-CM

## 2013-06-11 ENCOUNTER — Other Ambulatory Visit: Payer: Self-pay | Admitting: *Deleted

## 2013-06-11 DIAGNOSIS — D509 Iron deficiency anemia, unspecified: Secondary | ICD-10-CM

## 2013-06-12 ENCOUNTER — Encounter: Payer: Self-pay | Admitting: Internal Medicine

## 2013-06-12 ENCOUNTER — Other Ambulatory Visit (HOSPITAL_BASED_OUTPATIENT_CLINIC_OR_DEPARTMENT_OTHER): Payer: Medicare Other

## 2013-06-12 ENCOUNTER — Ambulatory Visit (HOSPITAL_BASED_OUTPATIENT_CLINIC_OR_DEPARTMENT_OTHER): Payer: Medicare Other

## 2013-06-12 ENCOUNTER — Ambulatory Visit (HOSPITAL_COMMUNITY)
Admission: RE | Admit: 2013-06-12 | Discharge: 2013-06-12 | Disposition: A | Discharge status: Home / Self Care | Payer: Medicare Other | Source: Ambulatory Visit | Attending: Internal Medicine | Admitting: Internal Medicine

## 2013-06-12 ENCOUNTER — Ambulatory Visit (HOSPITAL_BASED_OUTPATIENT_CLINIC_OR_DEPARTMENT_OTHER): Payer: Medicare Other | Admitting: Internal Medicine

## 2013-06-12 ENCOUNTER — Telehealth: Payer: Self-pay | Admitting: Certified Registered Nurse Anesthetist

## 2013-06-12 ENCOUNTER — Telehealth: Payer: Self-pay | Admitting: Internal Medicine

## 2013-06-12 VITALS — BP 100/55 | HR 58 | Temp 96.8°F | Resp 18 | Ht <= 58 in | Wt 71.6 lb

## 2013-06-12 DIAGNOSIS — D509 Iron deficiency anemia, unspecified: Secondary | ICD-10-CM

## 2013-06-12 DIAGNOSIS — D638 Anemia in other chronic diseases classified elsewhere: Secondary | ICD-10-CM

## 2013-06-12 DIAGNOSIS — D508 Other iron deficiency anemias: Secondary | ICD-10-CM

## 2013-06-12 DIAGNOSIS — M7989 Other specified soft tissue disorders: Secondary | ICD-10-CM | POA: Insufficient documentation

## 2013-06-12 DIAGNOSIS — R5381 Other malaise: Secondary | ICD-10-CM

## 2013-06-12 DIAGNOSIS — R5383 Other fatigue: Secondary | ICD-10-CM

## 2013-06-12 DIAGNOSIS — K5090 Crohn's disease, unspecified, without complications: Secondary | ICD-10-CM

## 2013-06-12 DIAGNOSIS — D500 Iron deficiency anemia secondary to blood loss (chronic): Secondary | ICD-10-CM

## 2013-06-12 LAB — COMPREHENSIVE METABOLIC PANEL (CC13)
ALBUMIN: 2.4 g/dL — AB (ref 3.5–5.0)
ALK PHOS: 53 U/L (ref 40–150)
ALT: 11 U/L (ref 0–55)
ANION GAP: 8 meq/L (ref 3–11)
AST: 11 U/L (ref 5–34)
BUN: 22.5 mg/dL (ref 7.0–26.0)
CALCIUM: 8.7 mg/dL (ref 8.4–10.4)
CO2: 22 meq/L (ref 22–29)
Chloride: 107 mEq/L (ref 98–109)
Creatinine: 1.6 mg/dL — ABNORMAL HIGH (ref 0.6–1.1)
GLUCOSE: 77 mg/dL (ref 70–140)
POTASSIUM: 3.6 meq/L (ref 3.5–5.1)
Sodium: 138 mEq/L (ref 136–145)
Total Bilirubin: 0.31 mg/dL (ref 0.20–1.20)
Total Protein: 7.2 g/dL (ref 6.4–8.3)

## 2013-06-12 LAB — CBC WITH DIFFERENTIAL/PLATELET
BASO%: 1.1 % (ref 0.0–2.0)
Basophils Absolute: 0 10*3/uL (ref 0.0–0.1)
EOS%: 2.4 % (ref 0.0–7.0)
Eosinophils Absolute: 0.1 10*3/uL (ref 0.0–0.5)
HEMATOCRIT: 29.1 % — AB (ref 34.8–46.6)
HGB: 9.3 g/dL — ABNORMAL LOW (ref 11.6–15.9)
LYMPH#: 0.5 10*3/uL — AB (ref 0.9–3.3)
LYMPH%: 16.3 % (ref 14.0–49.7)
MCH: 27.1 pg (ref 25.1–34.0)
MCHC: 31.9 g/dL (ref 31.5–36.0)
MCV: 84.9 fL (ref 79.5–101.0)
MONO#: 0.4 10*3/uL (ref 0.1–0.9)
MONO%: 13.4 % (ref 0.0–14.0)
NEUT#: 2.1 10*3/uL (ref 1.5–6.5)
NEUT%: 66.8 % (ref 38.4–76.8)
Platelets: 424 10*3/uL — ABNORMAL HIGH (ref 145–400)
RBC: 3.42 10*6/uL — ABNORMAL LOW (ref 3.70–5.45)
RDW: 18.4 % — ABNORMAL HIGH (ref 11.2–14.5)
WBC: 3.1 10*3/uL — ABNORMAL LOW (ref 3.9–10.3)

## 2013-06-12 LAB — IRON AND TIBC CHCC
%SAT: 20 % — ABNORMAL LOW (ref 21–57)
Iron: 47 ug/dL (ref 41–142)
TIBC: 239 ug/dL (ref 236–444)
UIBC: 192 ug/dL (ref 120–384)

## 2013-06-12 LAB — FERRITIN CHCC: Ferritin: 243 ng/ml (ref 9–269)

## 2013-06-12 LAB — HOLD TUBE, BLOOD BANK

## 2013-06-12 MED ORDER — CYANOCOBALAMIN 1000 MCG/ML IJ SOLN
1000.0000 ug | Freq: Once | INTRAMUSCULAR | Status: AC
  Administered 2013-06-12: 1000 ug via INTRAMUSCULAR

## 2013-06-12 MED ORDER — DARBEPOETIN ALFA-POLYSORBATE 300 MCG/0.6ML IJ SOLN
300.0000 ug | Freq: Once | INTRAMUSCULAR | Status: AC
  Administered 2013-06-12: 300 ug via SUBCUTANEOUS
  Filled 2013-06-12: qty 0.6

## 2013-06-12 NOTE — Progress Notes (Signed)
[place] [phone]   Fax:([phone]  OFFICE PROGRESS NOTE  [name], MD [address] [facility] [address] 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   PRIOR THERAPY:  1) PRBCs transfusion as needed last was given few days ago.   CURRENT THERAPY:  1) Intravenous Feraheme infusion on as-needed basis. Last dose was given on 12/20/2011, and the patient is scheduled for another treatment tomorrow.  2) Aranesp 300 mcg subcutaneously every 4 weeks for anemia of chronic disease.  3) vitamin B12 injection on monthly basis.  INTERVAL HISTORY: Kim [name] 73 y.o. female returns to the clinic today for routine three-month followup visit. The patient is doing fine today except for mild fatigue. She recently received 2 units of PRBCs transfusion 4 weeks ago. She started treatment with Vedolizumab for Crohn's disease at [facility] status post 3 doses and she is tolerating it well. She has less bleeding around the ostomy site. She is tolerating her treatment with Aranesp and vitamin B12 injection fairly well. She complained today for swelling and pain of the right lower extremity. The patient had repeat CBC and iron study performed today and she is here for evaluation and discussion of her lab results. She denied having any significant chest pain, shortness of breath, cough or hemoptysis.  MEDICAL HISTORY: Past Medical History  Diagnosis Date  . Osteoporosis     managed by Dr. [place] Crane at [facility], [place] q2 yrs  . OA (osteoarthritis)     hands,back  . Crohn disease     Dr. Emelda Fear at [facility]  . Glaucoma     Dr. Janyth Contes  . Intestinal malabsorption   . Anemia     related to malabsorption--gets B12 shots and iron infusions, managed by Dr. Julien Nordmann  . BCC (basal cell carcinoma of skin)     ALLERGIES:  is allergic to antihistamines,  chlorpheniramine-type; metronidazole; and sulfa antibiotics.  MEDICATIONS:  Current Outpatient Prescriptions  Medication Sig Dispense Refill  . acetaminophen (TYLENOL) 500 MG tablet Take 500 mg by mouth every 6 (six) hours as needed for pain.      . darbepoetin (ARANESP) 300 MCG/0.6ML SOLN Inject 300 mcg into the skin every 21 ( twenty-one) days.      . dorzolamide-timolol (COSOPT) 22.3-6.8 MG/ML ophthalmic solution 1 drop daily.        . hydrocortisone (CORTIFOAM) 90 MG/DOSE rectal foam Applied to stoma 2 times daily  15 g  0  . latanoprost (XALATAN) 0.005 % ophthalmic solution Apply 1 drop to eye daily. Place into both eyes Daily.      . Melatonin 10 MG TABS Take 10 mg by mouth at bedtime.      . Vedolizumab (ENTYVIO IV) Inject into the vein. Will be getting at [facility]      . vitamin B-12 (CYANOCOBALAMIN) 1000 MCG tablet Inject 1,000 mcg as directed every 30 (thirty) days.       Marland Kitchen adalimumab (HUMIRA) 40 MG/0.8ML injection Inject 0.8 mLs (40 mg total) into the skin every 14 (fourteen) days.  2 each  0  . potassium chloride SA (K-DUR,KLOR-CON) 20 MEQ tablet Take 1 tablet (20 mEq total) by mouth 2 (two) times daily.  30 tablet  3  . [DISCONTINUED] potassium chloride 40 MEQ/15ML (20%) LIQD Take 15 mLs (40 mEq total) by mouth as directed.  150 mL  0   No current facility-administered  medications for this visit.   Facility-Administered Medications Ordered in Other Visits  Medication Dose Route Frequency Provider Last Rate Last Dose  . acetaminophen (TYLENOL) tablet 650 mg  650 mg Oral Once Adrena E Johnson, PA-C      . cyanocobalamin ((VITAMIN B-12)) injection 1,000 mcg  1,000 mcg Intramuscular Once Curt Bears, MD      . darbepoetin Kyra Searles) injection 300 mcg  300 mcg Subcutaneous Once Curt Bears, MD      . diphenhydrAMINE (BENADRYL) capsule 25 mg  25 mg Oral Once Carlton Adam, PA-C        SURGICAL HISTORY:  Past Surgical History  Procedure Laterality Date  . Total colectomy   1984    due to Crohn's  . Internal ostomy pouch  1994  . Crohn  2009    REVIEW OF SYSTEMS:  Constitutional: positive for fatigue Eyes: negative Ears, nose, mouth, throat, and face: negative Respiratory: negative Cardiovascular: negative Gastrointestinal: negative Genitourinary:negative Integument/breast: negative Hematologic/lymphatic: negative Musculoskeletal:negative Neurological: negative Behavioral/Psych: negative Endocrine: negative Allergic/Immunologic: negative   PHYSICAL EXAMINATION: General appearance: alert, cooperative, fatigued and no distress Head: Normocephalic, without obvious abnormality, atraumatic Neck: no adenopathy, no JVD, supple, symmetrical, trachea midline and thyroid not enlarged, symmetric, no tenderness/mass/nodules Lymph nodes: Cervical, supraclavicular, and axillary nodes normal. Resp: clear to auscultation bilaterally Back: symmetric, no curvature. ROM normal. No CVA tenderness. Cardio: regular rate and rhythm, S1, S2 normal, no murmur, click, rub or gallop GI: soft, non-tender; bowel sounds normal; no masses,  no organomegaly Extremities: extremities normal, atraumatic, no cyanosis or edema and edema 1+ edema in the right lower extremity Neurologic: Alert and oriented X 3, normal strength and tone. Normal symmetric reflexes. Normal coordination and gait  ECOG PERFORMANCE STATUS: 1 - Symptomatic but completely ambulatory  Blood pressure 100/55, pulse 58, temperature 96.8 F (36 C), temperature source Oral, resp. rate 18, height 4' 9"  (1.448 m), weight 71 lb 9.6 oz (32.478 kg).  LABORATORY DATA: Lab Results  Component Value Date   WBC 3.1* 06/12/2013   HGB 9.3* 06/12/2013   HCT 29.1* 06/12/2013   MCV 84.9 06/12/2013   PLT 424* 06/12/2013      Chemistry      Component Value Date/Time   NA 137 12/19/2012 1058   NA 136 09/05/2011 1558   K 2.8 Repeated and Verified* 12/19/2012 1058   K 3.0* 09/05/2011 1558   CL 101 09/05/2011 1558   CO2 21* 12/19/2012 1058    CO2 27 09/05/2011 1558   BUN 24.5 12/19/2012 1058   BUN 34* 09/05/2011 1558   CREATININE 0.9 12/19/2012 1058   CREATININE 1.13* 09/05/2011 1558   CREATININE 1.04 08/22/2011 1106      Component Value Date/Time   CALCIUM 8.4 12/19/2012 1058   CALCIUM 8.8 09/05/2011 1558   ALKPHOS 44 09/05/2011 1558   AST 16 09/05/2011 1558   ALT 11 09/05/2011 1558   BILITOT 0.2* 09/05/2011 1558        RADIOGRAPHIC STUDIES: No results found.  ASSESSMENT AND PLAN: this is a very pleasant 73 years old white female with history of iron deficiency anemia/anemia of chronic disease secondary to malabsorption and Crohn's disease. The patient is tolerating her current treatment with Aranesp and vitamin B12 fairly well. Her iron study today is still pending today. Her hemoglobin and hematocrit are stable compared to the prior levels. I recommended for the patient to continue her current treatment with Aranesp and vitamin B12 every 4 weeks. For the swelling of the right lower  extremity, I will arrange for the patient to have duplex of the right lower extremity to rule out deep venous thrombosis.  I would see her back for follow up visit in 3 months with repeat CBC and iron study She was advised to call immediately if she has any concerning symptoms in the interval. The patient will continue her treatment with intravenous Vedolizumab under the care of Dr. Emelda Fear at [facility]. The patient voices understanding of current disease status and treatment options and is in agreement with the current care plan. All questions were answered. The patient knows to call the clinic with any problems, questions or concerns. We can certainly see the patient much sooner if necessary.

## 2013-06-12 NOTE — Progress Notes (Signed)
VASCULAR LAB PRELIMINARY  PRELIMINARY  PRELIMINARY  PRELIMINARY  Right lower extremity venous duplex completed.    Preliminary report:  Right:  No evidence of DVT, superficial thrombosis, or Baker's cyst.  Felicia Bloomquist, RVS 06/12/2013, 4:31 PM

## 2013-06-12 NOTE — Telephone Encounter (Signed)
Received call from switchboard, unable to locate collaborative or triage nurses. Call transferred around 13:30 hrs. from [place] at radiology department. Report received, " right lower extremities venous -negative". Pt. is still at radiology.  Results reported to Dr. Julien Nordmann and order received to discharge patient from radiology department.  Called back [phone] to [place] and discharge order given over the phone.  HL

## 2013-06-12 NOTE — Telephone Encounter (Signed)
appts made per 1/7 POF SW Cindy in [place] scheduled STAT US RLL for 1 pm today AVS and CAL given to pt shh

## 2013-06-12 NOTE — Patient Instructions (Signed)
Followup visit in 3 months with repeat CBC and iron study. Ultrasound Doppler of the right lower extremity to rule out deep venous thrombosis today.

## 2013-07-03 ENCOUNTER — Ambulatory Visit: Payer: Medicare Other

## 2013-07-03 ENCOUNTER — Ambulatory Visit
Admission: RE | Admit: 2013-07-03 | Discharge: 2013-07-03 | Disposition: A | Discharge status: Home / Self Care | Payer: Medicare Other | Source: Ambulatory Visit

## 2013-07-03 DIAGNOSIS — Z1231 Encounter for screening mammogram for malignant neoplasm of breast: Secondary | ICD-10-CM

## 2013-07-09 ENCOUNTER — Other Ambulatory Visit: Payer: Self-pay | Admitting: *Deleted

## 2013-07-09 ENCOUNTER — Ambulatory Visit (HOSPITAL_COMMUNITY)
Admission: RE | Admit: 2013-07-09 | Discharge: 2013-07-09 | Disposition: A | Discharge status: Home / Self Care | Payer: Medicare Other | Source: Ambulatory Visit | Attending: Internal Medicine | Admitting: Internal Medicine

## 2013-07-09 ENCOUNTER — Telehealth: Payer: Self-pay | Admitting: *Deleted

## 2013-07-09 ENCOUNTER — Encounter: Payer: Self-pay | Admitting: Internal Medicine

## 2013-07-09 ENCOUNTER — Other Ambulatory Visit (HOSPITAL_BASED_OUTPATIENT_CLINIC_OR_DEPARTMENT_OTHER): Payer: Medicare Other

## 2013-07-09 ENCOUNTER — Ambulatory Visit (HOSPITAL_BASED_OUTPATIENT_CLINIC_OR_DEPARTMENT_OTHER): Payer: Medicare Other

## 2013-07-09 VITALS — BP 114/38 | HR 61 | Temp 97.1°F

## 2013-07-09 DIAGNOSIS — D649 Anemia, unspecified: Secondary | ICD-10-CM

## 2013-07-09 DIAGNOSIS — D508 Other iron deficiency anemias: Secondary | ICD-10-CM

## 2013-07-09 DIAGNOSIS — D638 Anemia in other chronic diseases classified elsewhere: Secondary | ICD-10-CM

## 2013-07-09 DIAGNOSIS — K5090 Crohn's disease, unspecified, without complications: Secondary | ICD-10-CM

## 2013-07-09 LAB — CBC WITH DIFFERENTIAL/PLATELET
BASO%: 1.1 % (ref 0.0–2.0)
Basophils Absolute: 0 10*3/uL (ref 0.0–0.1)
EOS ABS: 0.1 10*3/uL (ref 0.0–0.5)
EOS%: 4.2 % (ref 0.0–7.0)
HCT: 24.8 % — ABNORMAL LOW (ref 34.8–46.6)
HGB: 7.8 g/dL — ABNORMAL LOW (ref 11.6–15.9)
LYMPH%: 15.1 % (ref 14.0–49.7)
MCH: 27.4 pg (ref 25.1–34.0)
MCHC: 31.6 g/dL (ref 31.5–36.0)
MCV: 86.9 fL (ref 79.5–101.0)
MONO#: 0.4 10*3/uL (ref 0.1–0.9)
MONO%: 15.8 % — ABNORMAL HIGH (ref 0.0–14.0)
NEUT#: 1.5 10*3/uL (ref 1.5–6.5)
NEUT%: 63.8 % (ref 38.4–76.8)
Platelets: 239 10*3/uL (ref 145–400)
RBC: 2.85 10*6/uL — ABNORMAL LOW (ref 3.70–5.45)
RDW: 20.2 % — ABNORMAL HIGH (ref 11.2–14.5)
WBC: 2.4 10*3/uL — AB (ref 3.9–10.3)
lymph#: 0.4 10*3/uL — ABNORMAL LOW (ref 0.9–3.3)

## 2013-07-09 LAB — CBC AND DIFFERENTIAL
HEMATOCRIT: 25 % — AB (ref 36–46)
HEMOGLOBIN: 7.8 g/dL — AB (ref 12.0–16.0)
PLATELETS: 239 10*3/uL (ref 150–399)
WBC: 2.4 10^3/mL

## 2013-07-09 LAB — PREPARE RBC (CROSSMATCH)

## 2013-07-09 MED ORDER — CYANOCOBALAMIN 1000 MCG/ML IJ SOLN
1000.0000 ug | Freq: Once | INTRAMUSCULAR | Status: AC
  Administered 2013-07-09: 1000 ug via INTRAMUSCULAR

## 2013-07-09 MED ORDER — DARBEPOETIN ALFA-POLYSORBATE 300 MCG/0.6ML IJ SOLN
300.0000 ug | Freq: Once | INTRAMUSCULAR | Status: AC
  Administered 2013-07-09: 300 ug via SUBCUTANEOUS
  Filled 2013-07-09: qty 0.6

## 2013-07-09 NOTE — Telephone Encounter (Signed)
Hbg 7.8, per Dr Vista Mink, pt needs 2 units blood.  Pt requested to be transfused on Thursday 07/11/13.  Pt is aware of the appt.  She was typed and crossmatched today.  SLJ

## 2013-07-11 ENCOUNTER — Ambulatory Visit: Payer: Medicare Other

## 2013-07-11 VITALS — BP 130/62 | HR 62 | Temp 97.3°F | Resp 18

## 2013-07-11 DIAGNOSIS — D649 Anemia, unspecified: Secondary | ICD-10-CM

## 2013-07-11 MED ORDER — ACETAMINOPHEN 325 MG PO TABS
ORAL_TABLET | ORAL | Status: AC
  Filled 2013-07-11: qty 2

## 2013-07-11 MED ORDER — ACETAMINOPHEN 325 MG PO TABS: 650.0000 mg | ORAL_TABLET | Freq: Once | ORAL | Status: DC

## 2013-07-11 MED ORDER — DIPHENHYDRAMINE HCL 25 MG PO CAPS: 25.0000 mg | ORAL_CAPSULE | Freq: Once | ORAL | Status: DC

## 2013-07-11 MED ORDER — DIPHENHYDRAMINE HCL 25 MG PO CAPS
ORAL_CAPSULE | ORAL | Status: AC
  Filled 2013-07-11: qty 1

## 2013-07-11 MED ORDER — SODIUM CHLORIDE 0.9 % IV SOLN: 250.0000 mL | Freq: Once | INTRAVENOUS | Status: DC

## 2013-07-11 NOTE — Patient Instructions (Signed)
Blood Transfusion Information WHAT IS A BLOOD TRANSFUSION? A transfusion is the replacement of blood or some of its parts. Blood is made up of multiple cells which provide different functions.  Red blood cells carry oxygen and are used for blood loss replacement.  White blood cells fight against infection.  Platelets control bleeding.  Plasma helps clot blood.  Other blood products are available for specialized needs, such as hemophilia or other clotting disorders. BEFORE THE TRANSFUSION  Who gives blood for transfusions?   You may be able to donate blood to be used at a later date on yourself (autologous donation).  Relatives can be asked to donate blood. This is generally not any safer than if you have received blood from a stranger. The same precautions are taken to ensure safety when a relative's blood is donated.  Healthy volunteers who are fully evaluated to make sure their blood is safe. This is blood bank blood. Transfusion therapy is the safest it has ever been in the practice of medicine. Before blood is taken from a donor, a complete history is taken to make sure that person has no history of diseases nor engages in risky social behavior (examples are intravenous drug use or sexual activity with multiple partners). The donor's travel history is screened to minimize risk of transmitting infections, such as malaria. The donated blood is tested for signs of infectious diseases, such as HIV and hepatitis. The blood is then tested to be sure it is compatible with you in order to minimize the chance of a transfusion reaction. If you or a relative donates blood, this is often done in anticipation of surgery and is not appropriate for emergency situations. It takes many days to process the donated blood. RISKS AND COMPLICATIONS Although transfusion therapy is very safe and saves many lives, the main dangers of transfusion include:   Getting an infectious disease.  Developing a  transfusion reaction. This is an allergic reaction to something in the blood you were given. Every precaution is taken to prevent this. The decision to have a blood transfusion has been considered carefully by your caregiver before blood is given. Blood is not given unless the benefits outweigh the risks. AFTER THE TRANSFUSION  Right after receiving a blood transfusion, you will usually feel much better and more energetic. This is especially true if your red blood cells have gotten low (anemic). The transfusion raises the level of the red blood cells which carry oxygen, and this usually causes an energy increase.  The nurse administering the transfusion will monitor you carefully for complications. HOME CARE INSTRUCTIONS  No special instructions are needed after a transfusion. You may find your energy is better. Speak with your caregiver about any limitations on activity for underlying diseases you may have. SEEK MEDICAL CARE IF:   Your condition is not improving after your transfusion.  You develop redness or irritation at the intravenous (IV) site. SEEK IMMEDIATE MEDICAL CARE IF:  Any of the following symptoms occur over the next 12 hours:  Shaking chills.  You have a temperature by mouth above 102 F (38.9 C), not controlled by medicine.  Chest, back, or muscle pain.  People around you feel you are not acting correctly or are confused.  Shortness of breath or difficulty breathing.  Dizziness and fainting.  You get a rash or develop hives.  You have a decrease in urine output.  Your urine turns a dark color or changes to pink, red, or brown. Any of the following   symptoms occur over the next 10 days:  You have a temperature by mouth above 102 F (38.9 C), not controlled by medicine.  Shortness of breath.  Weakness after normal activity.  The white part of the eye turns yellow (jaundice).  You have a decrease in the amount of urine or are urinating less often.  Your  urine turns a dark color or changes to pink, red, or brown. Document Released: 05/20/2000 Document Revised: 08/15/2011 Document Reviewed: 01/07/2008 ExitCare Patient Information 2014 [employer].  

## 2013-07-12 LAB — TYPE AND SCREEN
ABO/RH(D): AB POS
ANTIBODY SCREEN: NEGATIVE
UNIT DIVISION: 0
Unit division: 0

## 2013-08-07 ENCOUNTER — Ambulatory Visit (HOSPITAL_BASED_OUTPATIENT_CLINIC_OR_DEPARTMENT_OTHER): Payer: Medicare Other

## 2013-08-07 ENCOUNTER — Other Ambulatory Visit (HOSPITAL_BASED_OUTPATIENT_CLINIC_OR_DEPARTMENT_OTHER): Payer: Medicare Other

## 2013-08-07 VITALS — BP 116/58 | HR 60 | Temp 98.3°F

## 2013-08-07 DIAGNOSIS — D509 Iron deficiency anemia, unspecified: Secondary | ICD-10-CM

## 2013-08-07 DIAGNOSIS — K5090 Crohn's disease, unspecified, without complications: Secondary | ICD-10-CM

## 2013-08-07 DIAGNOSIS — D638 Anemia in other chronic diseases classified elsewhere: Secondary | ICD-10-CM

## 2013-08-07 LAB — CBC WITH DIFFERENTIAL/PLATELET
BASO%: 0.5 % (ref 0.0–2.0)
Basophils Absolute: 0 10*3/uL (ref 0.0–0.1)
EOS%: 2.5 % (ref 0.0–7.0)
Eosinophils Absolute: 0.1 10*3/uL (ref 0.0–0.5)
HCT: 29.4 % — ABNORMAL LOW (ref 34.8–46.6)
HGB: 9.1 g/dL — ABNORMAL LOW (ref 11.6–15.9)
LYMPH%: 11.7 % — ABNORMAL LOW (ref 14.0–49.7)
MCH: 27.6 pg (ref 25.1–34.0)
MCHC: 31.1 g/dL — AB (ref 31.5–36.0)
MCV: 88.8 fL (ref 79.5–101.0)
MONO#: 0.5 10*3/uL (ref 0.1–0.9)
MONO%: 10.3 % (ref 0.0–14.0)
NEUT#: 3.7 10*3/uL (ref 1.5–6.5)
NEUT%: 75 % (ref 38.4–76.8)
PLATELETS: 217 10*3/uL (ref 145–400)
RBC: 3.31 10*6/uL — AB (ref 3.70–5.45)
RDW: 17.5 % — AB (ref 11.2–14.5)
WBC: 4.9 10*3/uL (ref 3.9–10.3)
lymph#: 0.6 10*3/uL — ABNORMAL LOW (ref 0.9–3.3)

## 2013-08-07 MED ORDER — DARBEPOETIN ALFA-POLYSORBATE 300 MCG/0.6ML IJ SOLN
300.0000 ug | Freq: Once | INTRAMUSCULAR | Status: AC
  Administered 2013-08-07: 300 ug via SUBCUTANEOUS
  Filled 2013-08-07: qty 0.6

## 2013-08-07 MED ORDER — CYANOCOBALAMIN 1000 MCG/ML IJ SOLN
1000.0000 ug | Freq: Once | INTRAMUSCULAR | Status: AC
  Administered 2013-08-07: 1000 ug via INTRAMUSCULAR

## 2013-09-02 ENCOUNTER — Telehealth: Payer: Self-pay | Admitting: Internal Medicine

## 2013-09-02 NOTE — Telephone Encounter (Signed)
returned pt call and r/s appt with AJ due to pt not being able to see Dr. Tyrell Antonio that week due to availability....pt aware of new d.t

## 2013-09-10 ENCOUNTER — Ambulatory Visit: Payer: Medicare Other | Admitting: Internal Medicine

## 2013-09-10 ENCOUNTER — Ambulatory Visit: Payer: Medicare Other

## 2013-09-10 ENCOUNTER — Other Ambulatory Visit: Payer: Medicare Other

## 2013-09-17 ENCOUNTER — Ambulatory Visit (HOSPITAL_BASED_OUTPATIENT_CLINIC_OR_DEPARTMENT_OTHER): Payer: Medicare Other

## 2013-09-17 ENCOUNTER — Telehealth: Payer: Self-pay | Admitting: Internal Medicine

## 2013-09-17 ENCOUNTER — Other Ambulatory Visit (HOSPITAL_BASED_OUTPATIENT_CLINIC_OR_DEPARTMENT_OTHER): Payer: Medicare Other

## 2013-09-17 ENCOUNTER — Encounter: Payer: Self-pay | Admitting: Physician Assistant

## 2013-09-17 ENCOUNTER — Ambulatory Visit (HOSPITAL_BASED_OUTPATIENT_CLINIC_OR_DEPARTMENT_OTHER): Payer: Medicare Other | Admitting: Physician Assistant

## 2013-09-17 VITALS — BP 114/56 | HR 81 | Temp 98.8°F | Resp 18 | Ht <= 58 in | Wt 75.3 lb

## 2013-09-17 DIAGNOSIS — D638 Anemia in other chronic diseases classified elsewhere: Secondary | ICD-10-CM

## 2013-09-17 DIAGNOSIS — K5090 Crohn's disease, unspecified, without complications: Secondary | ICD-10-CM

## 2013-09-17 DIAGNOSIS — D509 Iron deficiency anemia, unspecified: Secondary | ICD-10-CM

## 2013-09-17 DIAGNOSIS — M7989 Other specified soft tissue disorders: Secondary | ICD-10-CM

## 2013-09-17 LAB — CBC WITH DIFFERENTIAL/PLATELET
BASO%: 0.2 % (ref 0.0–2.0)
Basophils Absolute: 0 10*3/uL (ref 0.0–0.1)
EOS%: 2.9 % (ref 0.0–7.0)
Eosinophils Absolute: 0.1 10*3/uL (ref 0.0–0.5)
HCT: 31.4 % — ABNORMAL LOW (ref 34.8–46.6)
HGB: 9.8 g/dL — ABNORMAL LOW (ref 11.6–15.9)
LYMPH#: 0.4 10*3/uL — AB (ref 0.9–3.3)
LYMPH%: 12.2 % — ABNORMAL LOW (ref 14.0–49.7)
MCH: 26.9 pg (ref 25.1–34.0)
MCHC: 31.4 g/dL — ABNORMAL LOW (ref 31.5–36.0)
MCV: 85.9 fL (ref 79.5–101.0)
MONO#: 0.3 10*3/uL (ref 0.1–0.9)
MONO%: 10.3 % (ref 0.0–14.0)
NEUT#: 2.3 10*3/uL (ref 1.5–6.5)
NEUT%: 74.4 % (ref 38.4–76.8)
Platelets: 225 10*3/uL (ref 145–400)
RBC: 3.66 10*6/uL — AB (ref 3.70–5.45)
RDW: 17.4 % — AB (ref 11.2–14.5)
WBC: 3.1 10*3/uL — AB (ref 3.9–10.3)

## 2013-09-17 LAB — FERRITIN CHCC: Ferritin: 134 ng/ml (ref 9–269)

## 2013-09-17 LAB — IRON AND TIBC CHCC
%SAT: 10 % — ABNORMAL LOW (ref 21–57)
IRON: 21 ug/dL — AB (ref 41–142)
TIBC: 213 ug/dL — AB (ref 236–444)
UIBC: 191 ug/dL (ref 120–384)

## 2013-09-17 LAB — HOLD TUBE, BLOOD BANK

## 2013-09-17 MED ORDER — DARBEPOETIN ALFA-POLYSORBATE 300 MCG/0.6ML IJ SOLN
300.0000 ug | Freq: Once | INTRAMUSCULAR | Status: AC
  Administered 2013-09-17: 300 ug via SUBCUTANEOUS
  Filled 2013-09-17: qty 0.6

## 2013-09-17 MED ORDER — CYANOCOBALAMIN 1000 MCG/ML IJ SOLN
1000.0000 ug | Freq: Once | INTRAMUSCULAR | Status: AC
  Administered 2013-09-17: 1000 ug via INTRAMUSCULAR

## 2013-09-17 NOTE — Telephone Encounter (Signed)
Gave pt appt for lab and MD until july , date for MD visit changed per pt rqst

## 2013-09-17 NOTE — Progress Notes (Addendum)
[place] [phone]   Fax:(336) [phone]  SHARED VISIT PROGRESS NOTE  [name], MD [address] 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   PRIOR THERAPY:  1) PRBCs transfusion as needed last was given few days ago.   CURRENT THERAPY:  1) Intravenous Feraheme infusion on as-needed basis. Last dose was given on 12/20/2011, and the patient is scheduled for another treatment tomorrow.  2) Aranesp 300 mcg subcutaneously every 4 weeks for anemia of chronic disease.  3) vitamin B12 injection on monthly basis.  INTERVAL HISTORY: VELMER BROADFOOT 73 y.o. female returns to the clinic today for routine three-month followup visit.  She was last transfused 07/11/2013. She reports a "stomach virus" over the past few days. She's had cramping and is felt very bad but has not had fever or chills. Her by mouth intake has been significantly decreased because of the symptoms. She is on treatment with Vedolizumab for Crohn's disease at [facility] and she is tolerating it well. She continues to have less bleeding around the ostomy site. She is tolerating her treatment with Aranesp and vitamin B12 injection fairly well.  The patient had repeat CBC and iron study performed today and she is here for evaluation and discussion of her lab results. She denied having any significant chest pain, shortness of breath, cough or hemoptysis.  MEDICAL HISTORY: Past Medical History  Diagnosis Date  . Osteoporosis     managed by Dr. [place] Crane at [facility], [place] q2 yrs  . OA (osteoarthritis)     hands,back  . Crohn disease     Dr. Emelda Fear at [facility]  . Glaucoma     Dr. Janyth Contes  . Intestinal malabsorption   . Anemia     related to malabsorption--gets B12 shots and iron infusions, managed by Dr. Julien Nordmann  . BCC (basal cell carcinoma of skin)     ALLERGIES:   is allergic to antihistamines, chlorpheniramine-type; metronidazole; and sulfa antibiotics.  MEDICATIONS:  Current Outpatient Prescriptions  Medication Sig Dispense Refill  . acetaminophen (TYLENOL) 500 MG tablet Take 500 mg by mouth every 6 (six) hours as needed for pain.      . darbepoetin (ARANESP) 300 MCG/0.6ML SOLN Inject 300 mcg into the skin every 21 ( twenty-one) days.      . dorzolamide-timolol (COSOPT) 22.3-6.8 MG/ML ophthalmic solution 1 drop daily.        Marland Kitchen gabapentin (NEURONTIN) 300 MG capsule Take by mouth.      . latanoprost (XALATAN) 0.005 % ophthalmic solution Apply 1 drop to eye daily. Place into both eyes Daily.      . Vedolizumab (ENTYVIO IV) Inject into the vein. Will be getting at [facility]      . vitamin B-12 (CYANOCOBALAMIN) 1000 MCG tablet Inject 1,000 mcg as directed every 30 (thirty) days.       Marland Kitchen adalimumab (HUMIRA) 40 MG/0.8ML injection Inject 0.8 mLs (40 mg total) into the skin every 14 (fourteen) days.  2 each  0  . cefdinir (OMNICEF) 300 MG capsule       . hydrocortisone (CORTIFOAM) 90 MG/DOSE rectal foam Applied to stoma 2 times daily  15 g  0  . Melatonin 10 MG TABS Take 10 mg by mouth at bedtime.      . potassium chloride SA (K-DUR,KLOR-CON) 20 MEQ tablet Take 1 tablet (20 mEq total) by mouth 2 (  two) times daily.  30 tablet  3  . [DISCONTINUED] potassium chloride 40 MEQ/15ML (20%) LIQD Take 15 mLs (40 mEq total) by mouth as directed.  150 mL  0   No current facility-administered medications for this visit.   Facility-Administered Medications Ordered in Other Visits  Medication Dose Route Frequency Provider Last Rate Last Dose  . acetaminophen (TYLENOL) tablet 650 mg  650 mg Oral Once [employer], PA-C      . diphenhydrAMINE (BENADRYL) capsule 25 mg  25 mg Oral Once Carlton Adam, PA-C        SURGICAL HISTORY:  Past Surgical History  Procedure Laterality Date  . Total colectomy  1984    due to Crohn's  . Internal ostomy pouch  1994  . Crohn  2009     REVIEW OF SYSTEMS:  Constitutional: positive for fatigue Eyes: negative Ears, nose, mouth, throat, and face: negative Respiratory: negative Cardiovascular: negative Gastrointestinal: positive for diarrhea Genitourinary:negative Integument/breast: negative Hematologic/lymphatic: negative Musculoskeletal:negative Neurological: negative Behavioral/Psych: negative Endocrine: negative Allergic/Immunologic: negative   PHYSICAL EXAMINATION: General appearance: alert, cooperative, fatigued and no distress Head: Normocephalic, without obvious abnormality, atraumatic Neck: no adenopathy, no JVD, supple, symmetrical, trachea midline and thyroid not enlarged, symmetric, no tenderness/mass/nodules Lymph nodes: Cervical, supraclavicular, and axillary nodes normal. Resp: clear to auscultation bilaterally Back: symmetric, no curvature. ROM normal. No CVA tenderness. Cardio: regular rate and rhythm, S1, S2 normal, no murmur, click, rub or gallop GI: soft, non-tender; bowel sounds normal; no masses,  no organomegaly Extremities: extremities normal, atraumatic, no cyanosis or edema and edema 1+ edema in the right lower extremity Neurologic: Alert and oriented X 3, normal strength and tone. Normal symmetric reflexes. Normal coordination and gait  ECOG PERFORMANCE STATUS: 1 - Symptomatic but completely ambulatory  Blood pressure 114/56, pulse 81, temperature 98.8 F (37.1 C), temperature source Oral, resp. rate 18, height 4' 9"  (1.448 m), weight 75 lb 4.8 oz (34.156 kg), SpO2 100.00%.  LABORATORY DATA: Lab Results  Component Value Date   WBC 3.1* 09/17/2013   HGB 9.8* 09/17/2013   HCT 31.4* 09/17/2013   MCV 85.9 09/17/2013   PLT 225 09/17/2013      Chemistry      Component Value Date/Time   NA 138 06/12/2013 1122   NA 136 09/05/2011 1558   K 3.6 06/12/2013 1122   K 3.0* 09/05/2011 1558   CL 101 09/05/2011 1558   CO2 22 06/12/2013 1122   CO2 27 09/05/2011 1558   BUN 22.5 06/12/2013 1122   BUN 34*  09/05/2011 1558   CREATININE 1.6* 06/12/2013 1122   CREATININE 1.13* 09/05/2011 1558   CREATININE 1.04 08/22/2011 1106      Component Value Date/Time   CALCIUM 8.7 06/12/2013 1122   CALCIUM 8.8 09/05/2011 1558   ALKPHOS 53 06/12/2013 1122   ALKPHOS 44 09/05/2011 1558   AST 11 06/12/2013 1122   AST 16 09/05/2011 1558   ALT 11 06/12/2013 1122   ALT 11 09/05/2011 1558   BILITOT 0.31 06/12/2013 1122   BILITOT 0.2* 09/05/2011 1558        RADIOGRAPHIC STUDIES: No results found.  ASSESSMENT AND PLAN: this is a very pleasant 73 years old white female with history of iron deficiency anemia/anemia of chronic disease secondary to malabsorption and Crohn's disease. The patient is tolerating her current treatment with Aranesp and vitamin B12 fairly well. Patient was discussed with also seen by Dr. Julien Nordmann. Her hemoglobin is relatively stable at 9.8, however her iron studies are  pending from today. The patient will continue her current treatment with Aranesp and vitamin B12 every 4 weeks. For her GI symptoms, the patient is advised to follow a BRATdiet and contact her gastroenterologist if her symptoms do not significantly improve over the next 24-48 hours. Patient voiced understanding. She'll followup with Dr. Julien Nordmann in 3 months with repeat CBC and iron study She was advised to call immediately if she has any concerning symptoms in the interval. The patient will continue her treatment with intravenous Vedolizumab under the care of Dr. Emelda [facility] at [facility]. The patient voices understanding of current disease status and treatment options and is in agreement with the current care plan. All questions were answered. The patient knows to call the clinic with any problems, questions or concerns. We can certainly see the patient much sooner if necessary.  Carlton Adam, PA-C  ADDENDUM: Hematology/Oncology Attending: I had a face to face encounter with the patient. I recommended her care plan. This is a very pleasant 73  years old white female with anemia of chronic disease as well as iron deficiency. She is currently on treatment with Aranesp and vitamin B12 every 4 weeks. Her recent iron study showed iron deficiency and the patient would benefit from treatment with Feraheme infusion. I will arrange for the patient to receive 2 doses of Feraheme in the next next week. She would come back for follow up visit in 3 months with repeat CBC, iron study and ferritin. She will continue her current treatment withvitamin B12 injection as well as Aranesp. She was advised to call immediately if she has any concerning symptoms in the interval.  Disclaimer: This note was dictated with voice recognition software. Similar sounding words can inadvertently be transcribed and may not be corrected upon review. Curt Bears, MD 09/21/2013

## 2013-09-17 NOTE — Progress Notes (Signed)
Quick Note:  Call patient with the result and she needs Feraheme infusion this Friday and next Friday. ______

## 2013-09-18 ENCOUNTER — Telehealth: Payer: Self-pay | Admitting: Medical Oncology

## 2013-09-18 ENCOUNTER — Other Ambulatory Visit: Payer: Self-pay | Admitting: Medical Oncology

## 2013-09-18 NOTE — Telephone Encounter (Signed)
Pt notified and scheduled for feraheme.

## 2013-09-18 NOTE — Telephone Encounter (Signed)
Message copied by Ardeen Garland on Wed Sep 18, 2013  1:49 PM ------      Message from: Curt Bears      Created: Tue Sep 17, 2013  5:56 PM       Call patient with the result and she needs Feraheme infusion this Friday and next Friday. ------

## 2013-09-19 NOTE — Patient Instructions (Signed)
Call Dr. Emelda Fear if you're GI symptoms persist or worsen Followup with Dr. Julien Nordmann in 3 months with repeat iron studies to reevaluate her disease

## 2013-09-20 ENCOUNTER — Ambulatory Visit (HOSPITAL_BASED_OUTPATIENT_CLINIC_OR_DEPARTMENT_OTHER): Payer: Medicare Other

## 2013-09-20 ENCOUNTER — Other Ambulatory Visit: Payer: Self-pay | Admitting: Internal Medicine

## 2013-09-20 VITALS — BP 96/56 | HR 66 | Temp 96.8°F | Resp 18

## 2013-09-20 DIAGNOSIS — D638 Anemia in other chronic diseases classified elsewhere: Secondary | ICD-10-CM

## 2013-09-20 DIAGNOSIS — K5090 Crohn's disease, unspecified, without complications: Secondary | ICD-10-CM

## 2013-09-20 MED ORDER — SODIUM CHLORIDE 0.9 % IV SOLN
Freq: Once | INTRAVENOUS | Status: AC
  Administered 2013-09-20: 13:00:00 via INTRAVENOUS

## 2013-09-20 MED ORDER — SODIUM CHLORIDE 0.9 % IV SOLN
510.0000 mg | Freq: Once | INTRAVENOUS | Status: AC
  Administered 2013-09-20: 510 mg via INTRAVENOUS
  Filled 2013-09-20: qty 17

## 2013-09-20 NOTE — Patient Instructions (Signed)

## 2013-09-27 ENCOUNTER — Ambulatory Visit (HOSPITAL_BASED_OUTPATIENT_CLINIC_OR_DEPARTMENT_OTHER): Payer: Medicare Other

## 2013-09-27 DIAGNOSIS — K5090 Crohn's disease, unspecified, without complications: Secondary | ICD-10-CM

## 2013-09-27 DIAGNOSIS — D638 Anemia in other chronic diseases classified elsewhere: Secondary | ICD-10-CM

## 2013-09-27 MED ORDER — HEPARIN SOD (PORK) LOCK FLUSH 100 UNIT/ML IV SOLN
250.0000 [IU] | Freq: Once | INTRAVENOUS | Status: DC | PRN
  Filled 2013-09-27: qty 5

## 2013-09-27 MED ORDER — SODIUM CHLORIDE 0.9 % IV SOLN
510.0000 mg | Freq: Once | INTRAVENOUS | Status: AC
  Administered 2013-09-27: 510 mg via INTRAVENOUS
  Filled 2013-09-27: qty 17

## 2013-09-27 MED ORDER — FERUMOXYTOL INJECTION 510 MG/17 ML: 510.0000 mg | Freq: Once | INTRAVENOUS | Status: DC

## 2013-09-27 MED ORDER — HEPARIN SOD (PORK) LOCK FLUSH 100 UNIT/ML IV SOLN
500.0000 [IU] | Freq: Once | INTRAVENOUS | Status: DC | PRN
  Filled 2013-09-27: qty 5

## 2013-09-27 MED ORDER — SODIUM CHLORIDE 0.9 % IV SOLN
Freq: Once | INTRAVENOUS | Status: AC
  Administered 2013-09-27: 13:00:00 via INTRAVENOUS

## 2013-10-17 ENCOUNTER — Other Ambulatory Visit (HOSPITAL_BASED_OUTPATIENT_CLINIC_OR_DEPARTMENT_OTHER): Payer: Medicare Other

## 2013-10-17 ENCOUNTER — Other Ambulatory Visit: Payer: Self-pay | Admitting: Internal Medicine

## 2013-10-17 ENCOUNTER — Ambulatory Visit (HOSPITAL_BASED_OUTPATIENT_CLINIC_OR_DEPARTMENT_OTHER): Payer: Medicare Other

## 2013-10-17 ENCOUNTER — Other Ambulatory Visit: Payer: Self-pay | Admitting: Medical Oncology

## 2013-10-17 VITALS — BP 108/33 | HR 62 | Temp 97.5°F

## 2013-10-17 DIAGNOSIS — D638 Anemia in other chronic diseases classified elsewhere: Secondary | ICD-10-CM

## 2013-10-17 DIAGNOSIS — D509 Iron deficiency anemia, unspecified: Secondary | ICD-10-CM

## 2013-10-17 DIAGNOSIS — D508 Other iron deficiency anemias: Secondary | ICD-10-CM

## 2013-10-17 DIAGNOSIS — K5090 Crohn's disease, unspecified, without complications: Secondary | ICD-10-CM

## 2013-10-17 LAB — CBC WITH DIFFERENTIAL/PLATELET
BASO%: 0.2 % (ref 0.0–2.0)
Basophils Absolute: 0 10*3/uL (ref 0.0–0.1)
EOS%: 2.3 % (ref 0.0–7.0)
Eosinophils Absolute: 0.1 10*3/uL (ref 0.0–0.5)
HEMATOCRIT: 29.4 % — AB (ref 34.8–46.6)
HGB: 9 g/dL — ABNORMAL LOW (ref 11.6–15.9)
LYMPH%: 11.6 % — AB (ref 14.0–49.7)
MCH: 28.4 pg (ref 25.1–34.0)
MCHC: 30.6 g/dL — ABNORMAL LOW (ref 31.5–36.0)
MCV: 92.7 fL (ref 79.5–101.0)
MONO#: 0.5 10*3/uL (ref 0.1–0.9)
MONO%: 9.3 % (ref 0.0–14.0)
NEUT#: 4.1 10*3/uL (ref 1.5–6.5)
NEUT%: 76.6 % (ref 38.4–76.8)
PLATELETS: 182 10*3/uL (ref 145–400)
RBC: 3.17 10*6/uL — AB (ref 3.70–5.45)
RDW: 17.9 % — ABNORMAL HIGH (ref 11.2–14.5)
WBC: 5.3 10*3/uL (ref 3.9–10.3)
lymph#: 0.6 10*3/uL — ABNORMAL LOW (ref 0.9–3.3)

## 2013-10-17 MED ORDER — CYANOCOBALAMIN 1000 MCG/ML IJ SOLN
1000.0000 ug | Freq: Once | INTRAMUSCULAR | Status: AC
  Administered 2013-10-17: 1000 ug via INTRAMUSCULAR

## 2013-10-17 MED ORDER — DARBEPOETIN ALFA-POLYSORBATE 300 MCG/0.6ML IJ SOLN
300.0000 ug | Freq: Once | INTRAMUSCULAR | Status: AC
  Administered 2013-10-17: 300 ug via SUBCUTANEOUS
  Filled 2013-10-17: qty 0.6

## 2013-10-17 NOTE — Progress Notes (Signed)
Pt thinks she may need blood June 10th and requested labs. ONC tx request sent

## 2013-10-19 ENCOUNTER — Telehealth: Payer: Self-pay | Admitting: Internal Medicine

## 2013-10-19 NOTE — Telephone Encounter (Signed)
s.w pt and advised on June appt...pt ok and aware

## 2013-11-13 ENCOUNTER — Other Ambulatory Visit (HOSPITAL_BASED_OUTPATIENT_CLINIC_OR_DEPARTMENT_OTHER): Payer: Medicare Other

## 2013-11-13 ENCOUNTER — Other Ambulatory Visit: Payer: Self-pay | Admitting: *Deleted

## 2013-11-13 ENCOUNTER — Ambulatory Visit (HOSPITAL_BASED_OUTPATIENT_CLINIC_OR_DEPARTMENT_OTHER): Payer: Medicare Other

## 2013-11-13 ENCOUNTER — Ambulatory Visit (HOSPITAL_COMMUNITY)
Admission: RE | Admit: 2013-11-13 | Discharge: 2013-11-13 | Disposition: A | Discharge status: Home / Self Care | Payer: Medicare Other | Source: Ambulatory Visit | Attending: Internal Medicine | Admitting: Internal Medicine

## 2013-11-13 VITALS — BP 124/71 | HR 54 | Temp 97.5°F | Resp 18

## 2013-11-13 DIAGNOSIS — D539 Nutritional anemia, unspecified: Secondary | ICD-10-CM

## 2013-11-13 DIAGNOSIS — D649 Anemia, unspecified: Secondary | ICD-10-CM

## 2013-11-13 DIAGNOSIS — D509 Iron deficiency anemia, unspecified: Secondary | ICD-10-CM

## 2013-11-13 DIAGNOSIS — D638 Anemia in other chronic diseases classified elsewhere: Secondary | ICD-10-CM

## 2013-11-13 DIAGNOSIS — K5090 Crohn's disease, unspecified, without complications: Secondary | ICD-10-CM

## 2013-11-13 LAB — CBC WITH DIFFERENTIAL/PLATELET
BASO%: 0.3 % (ref 0.0–2.0)
Basophils Absolute: 0 10*3/uL (ref 0.0–0.1)
EOS ABS: 0.1 10*3/uL (ref 0.0–0.5)
EOS%: 3.2 % (ref 0.0–7.0)
HEMATOCRIT: 27.8 % — AB (ref 34.8–46.6)
HGB: 8.6 g/dL — ABNORMAL LOW (ref 11.6–15.9)
LYMPH%: 16.1 % (ref 14.0–49.7)
MCH: 28.7 pg (ref 25.1–34.0)
MCHC: 30.9 g/dL — AB (ref 31.5–36.0)
MCV: 92.7 fL (ref 79.5–101.0)
MONO#: 0.4 10*3/uL (ref 0.1–0.9)
MONO%: 12.6 % (ref 0.0–14.0)
NEUT#: 2.2 10*3/uL (ref 1.5–6.5)
NEUT%: 67.8 % (ref 38.4–76.8)
PLATELETS: 123 10*3/uL — AB (ref 145–400)
RBC: 3 10*6/uL — ABNORMAL LOW (ref 3.70–5.45)
RDW: 16.3 % — ABNORMAL HIGH (ref 11.2–14.5)
WBC: 3.2 10*3/uL — AB (ref 3.9–10.3)
lymph#: 0.5 10*3/uL — ABNORMAL LOW (ref 0.9–3.3)
nRBC: 0 % (ref 0–0)

## 2013-11-13 LAB — PREPARE RBC (CROSSMATCH)

## 2013-11-13 LAB — HOLD TUBE, BLOOD BANK

## 2013-11-13 MED ORDER — DARBEPOETIN ALFA-POLYSORBATE 300 MCG/0.6ML IJ SOLN
300.0000 ug | Freq: Once | INTRAMUSCULAR | Status: AC
  Administered 2013-11-13: 300 ug via SUBCUTANEOUS
  Filled 2013-11-13: qty 0.6

## 2013-11-13 MED ORDER — DIPHENHYDRAMINE HCL 25 MG PO CAPS: 25.0000 mg | ORAL_CAPSULE | Freq: Once | ORAL | Status: DC

## 2013-11-13 MED ORDER — ACETAMINOPHEN 325 MG PO TABS: 650.0000 mg | ORAL_TABLET | Freq: Once | ORAL | Status: DC

## 2013-11-13 MED ORDER — CYANOCOBALAMIN 1000 MCG/ML IJ SOLN
1000.0000 ug | Freq: Once | INTRAMUSCULAR | Status: AC
  Administered 2013-11-13: 1000 ug via INTRAMUSCULAR

## 2013-11-13 MED ORDER — SODIUM CHLORIDE 0.9 % IV SOLN
250.0000 mL | Freq: Once | INTRAVENOUS | Status: AC
  Administered 2013-11-13: 250 mL via INTRAVENOUS

## 2013-11-13 NOTE — Progress Notes (Signed)
Hbg 8.6, pt is requesting 2 units, she will receive both units today.  SLJ

## 2013-11-13 NOTE — Patient Instructions (Signed)
Blood Transfusion Information WHAT IS A BLOOD TRANSFUSION? A transfusion is the replacement of blood or some of its parts. Blood is made up of multiple cells which provide different functions.  Red blood cells carry oxygen and are used for blood loss replacement.  White blood cells fight against infection.  Platelets control bleeding.  Plasma helps clot blood.  Other blood products are available for specialized needs, such as hemophilia or other clotting disorders. BEFORE THE TRANSFUSION  Who gives blood for transfusions?   You may be able to donate blood to be used at a later date on yourself (autologous donation).  Relatives can be asked to donate blood. This is generally not any safer than if you have received blood from a stranger. The same precautions are taken to ensure safety when a relative's blood is donated.  Healthy volunteers who are fully evaluated to make sure their blood is safe. This is blood bank blood. Transfusion therapy is the safest it has ever been in the practice of medicine. Before blood is taken from a donor, a complete history is taken to make sure that person has no history of diseases nor engages in risky social behavior (examples are intravenous drug use or sexual activity with multiple partners). The donor's travel history is screened to minimize risk of transmitting infections, such as malaria. The donated blood is tested for signs of infectious diseases, such as HIV and hepatitis. The blood is then tested to be sure it is compatible with you in order to minimize the chance of a transfusion reaction. If you or a relative donates blood, this is often done in anticipation of surgery and is not appropriate for emergency situations. It takes many days to process the donated blood. RISKS AND COMPLICATIONS Although transfusion therapy is very safe and saves many lives, the main dangers of transfusion include:   Getting an infectious disease.  Developing a  transfusion reaction. This is an allergic reaction to something in the blood you were given. Every precaution is taken to prevent this. The decision to have a blood transfusion has been considered carefully by your caregiver before blood is given. Blood is not given unless the benefits outweigh the risks. AFTER THE TRANSFUSION  Right after receiving a blood transfusion, you will usually feel much better and more energetic. This is especially true if your red blood cells have gotten low (anemic). The transfusion raises the level of the red blood cells which carry oxygen, and this usually causes an energy increase.  The nurse administering the transfusion will monitor you carefully for complications. HOME CARE INSTRUCTIONS  No special instructions are needed after a transfusion. You may find your energy is better. Speak with your caregiver about any limitations on activity for underlying diseases you may have. SEEK MEDICAL CARE IF:   Your condition is not improving after your transfusion.  You develop redness or irritation at the intravenous (IV) site. SEEK IMMEDIATE MEDICAL CARE IF:  Any of the following symptoms occur over the next 12 hours:  Shaking chills.  You have a temperature by mouth above 102 F (38.9 C), not controlled by medicine.  Chest, back, or muscle pain.  People around you feel you are not acting correctly or are confused.  Shortness of breath or difficulty breathing.  Dizziness and fainting.  You get a rash or develop hives.  You have a decrease in urine output.  Your urine turns a dark color or changes to pink, red, or brown. Any of the following   symptoms occur over the next 10 days:  You have a temperature by mouth above 102 F (38.9 C), not controlled by medicine.  Shortness of breath.  Weakness after normal activity.  The white part of the eye turns yellow (jaundice).  You have a decrease in the amount of urine or are urinating less often.  Your  urine turns a dark color or changes to pink, red, or brown. Document Released: 05/20/2000 Document Revised: 08/15/2011 Document Reviewed: 01/07/2008 ExitCare Patient Information 2014 [employer].  

## 2013-11-14 ENCOUNTER — Ambulatory Visit: Payer: Medicare Other

## 2013-11-14 ENCOUNTER — Other Ambulatory Visit: Payer: Medicare Other

## 2013-11-14 LAB — TYPE AND SCREEN
ABO/RH(D): AB POS
ANTIBODY SCREEN: NEGATIVE
UNIT DIVISION: 0
Unit division: 0

## 2013-12-11 ENCOUNTER — Other Ambulatory Visit: Payer: Self-pay | Admitting: *Deleted

## 2013-12-11 DIAGNOSIS — D509 Iron deficiency anemia, unspecified: Secondary | ICD-10-CM

## 2013-12-12 ENCOUNTER — Other Ambulatory Visit (HOSPITAL_BASED_OUTPATIENT_CLINIC_OR_DEPARTMENT_OTHER): Payer: Medicare Other

## 2013-12-12 ENCOUNTER — Encounter: Payer: Self-pay | Admitting: Internal Medicine

## 2013-12-12 ENCOUNTER — Telehealth: Payer: Self-pay | Admitting: Internal Medicine

## 2013-12-12 ENCOUNTER — Ambulatory Visit (HOSPITAL_BASED_OUTPATIENT_CLINIC_OR_DEPARTMENT_OTHER): Payer: Medicare Other

## 2013-12-12 ENCOUNTER — Ambulatory Visit (HOSPITAL_BASED_OUTPATIENT_CLINIC_OR_DEPARTMENT_OTHER): Payer: Medicare Other | Admitting: Internal Medicine

## 2013-12-12 VITALS — BP 111/54 | HR 68 | Temp 97.7°F | Resp 17 | Ht <= 58 in | Wt 80.7 lb

## 2013-12-12 DIAGNOSIS — K909 Intestinal malabsorption, unspecified: Secondary | ICD-10-CM

## 2013-12-12 DIAGNOSIS — D509 Iron deficiency anemia, unspecified: Secondary | ICD-10-CM

## 2013-12-12 DIAGNOSIS — D638 Anemia in other chronic diseases classified elsewhere: Secondary | ICD-10-CM

## 2013-12-12 LAB — IRON AND TIBC CHCC
%SAT: 41 % (ref 21–57)
Iron: 74 ug/dL (ref 41–142)
TIBC: 180 ug/dL — ABNORMAL LOW (ref 236–444)
UIBC: 106 ug/dL — ABNORMAL LOW (ref 120–384)

## 2013-12-12 LAB — CBC WITH DIFFERENTIAL/PLATELET
BASO%: 0.7 % (ref 0.0–2.0)
Basophils Absolute: 0 10*3/uL (ref 0.0–0.1)
EOS%: 4.6 % (ref 0.0–7.0)
Eosinophils Absolute: 0.2 10*3/uL (ref 0.0–0.5)
HCT: 34.4 % — ABNORMAL LOW (ref 34.8–46.6)
HGB: 11 g/dL — ABNORMAL LOW (ref 11.6–15.9)
LYMPH#: 0.7 10*3/uL — AB (ref 0.9–3.3)
LYMPH%: 16.3 % (ref 14.0–49.7)
MCH: 29 pg (ref 25.1–34.0)
MCHC: 31.9 g/dL (ref 31.5–36.0)
MCV: 90.9 fL (ref 79.5–101.0)
MONO#: 0.4 10*3/uL (ref 0.1–0.9)
MONO%: 10.7 % (ref 0.0–14.0)
NEUT#: 2.7 10*3/uL (ref 1.5–6.5)
NEUT%: 67.7 % (ref 38.4–76.8)
PLATELETS: 180 10*3/uL (ref 145–400)
RBC: 3.79 10*6/uL (ref 3.70–5.45)
RDW: 15.4 % — ABNORMAL HIGH (ref 11.2–14.5)
WBC: 4 10*3/uL (ref 3.9–10.3)

## 2013-12-12 LAB — FERRITIN CHCC: Ferritin: 355 ng/ml — ABNORMAL HIGH (ref 9–269)

## 2013-12-12 LAB — HOLD TUBE, BLOOD BANK

## 2013-12-12 MED ORDER — CYANOCOBALAMIN 1000 MCG/ML IJ SOLN
1000.0000 ug | Freq: Once | INTRAMUSCULAR | Status: AC
  Administered 2013-12-12: 1000 ug via INTRAMUSCULAR

## 2013-12-12 NOTE — Telephone Encounter (Signed)
Gave pt appt for lab,md and injection monthly and October 2015

## 2013-12-12 NOTE — Progress Notes (Signed)
[place] [phone]   [phone]  OFFICE PROGRESS NOTE  [name], MD [address] 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   PRIOR THERAPY:  1) PRBCs transfusion as needed last was given few days ago.   CURRENT THERAPY:  1) Intravenous Feraheme infusion on as-needed basis. Last dose was given on 12/20/2011, and the patient is scheduled for another treatment tomorrow.  2) Aranesp 300 mcg subcutaneously every 4 weeks for anemia of chronic disease.  3) vitamin B12 injection on monthly basis.  INTERVAL HISTORY: Kim [name] 73 y.o. female returns to the clinic today for routine three-month followup visit. The patient is doing fine today. She recently received 2 units of PRBCs transfusion 2 weeks ago. She started treatment with Vedolizumab for Crohn's disease at [facility] since December 2014 and she is tolerating it well. She has less bleeding around the ostomy site. She is tolerating her treatment with Aranesp and vitamin B12 injection fairly well. She still wondering about the benefit of Aranesp injection and she would like to try few months without the injection. She would like to continue with the vitamin B12 injection monthly. The patient had repeat CBC and iron study performed today and she is here for evaluation and discussion of her lab results. She denied having any significant chest pain, shortness of breath, cough or hemoptysis.  MEDICAL HISTORY: Past Medical History  Diagnosis Date  . Osteoporosis     managed by Dr. Nevada Crane at [facility], [place] q2 yrs  . OA (osteoarthritis)     hands,back  . Crohn disease     Dr. Emelda Fear at [facility]  . Glaucoma     Dr. Janyth Contes  . Intestinal malabsorption   . Anemia     related to malabsorption--gets B12 shots and iron infusions, managed by Dr. Julien Nordmann  . BCC (basal  cell carcinoma of skin)     ALLERGIES:  is allergic to antihistamines, chlorpheniramine-type; metronidazole; and sulfa antibiotics.  MEDICATIONS:  Current Outpatient Prescriptions  Medication Sig Dispense Refill  . acetaminophen (TYLENOL) 500 MG tablet Take 500 mg by mouth every 6 (six) hours as needed for pain.      . cefdinir (OMNICEF) 300 MG capsule       . darbepoetin (ARANESP) 300 MCG/0.6ML SOLN Inject 300 mcg into the skin every 21 ( twenty-one) days.      . dorzolamide-timolol (COSOPT) 22.3-6.8 MG/ML ophthalmic solution 1 drop daily.        Marland Kitchen gabapentin (NEURONTIN) 300 MG capsule Take by mouth.      . latanoprost (XALATAN) 0.005 % ophthalmic solution Apply 1 drop to eye daily. Place into both eyes Daily.      . Vedolizumab (ENTYVIO IV) Inject into the vein. Will be getting at [facility]      . vitamin B-12 (CYANOCOBALAMIN) 1000 MCG tablet Inject 1,000 mcg as directed every 30 (thirty) days.       . [DISCONTINUED] potassium chloride 40 MEQ/15ML (20%) LIQD Take 15 mLs (40 mEq total) by mouth as directed.  150 mL  0   No current facility-administered medications for this visit.   Facility-Administered Medications Ordered in Other Visits  Medication Dose Route Frequency Provider Last Rate Last Dose  . acetaminophen (TYLENOL) tablet 650 mg  650 mg Oral Once [employer], PA-C      . diphenhydrAMINE (BENADRYL) capsule  25 mg  25 mg Oral Once Carlton Adam, PA-C        SURGICAL HISTORY:  Past Surgical History  Procedure Laterality Date  . Total colectomy  1984    due to Crohn's  . Internal ostomy pouch  1994  . Crohn  2009    REVIEW OF SYSTEMS:  Constitutional: positive for fatigue Eyes: negative Ears, nose, mouth, throat, and face: negative Respiratory: negative Cardiovascular: negative Gastrointestinal: negative Genitourinary:negative Integument/breast: negative Hematologic/lymphatic: negative Musculoskeletal:negative Neurological: negative Behavioral/Psych:  negative Endocrine: negative Allergic/Immunologic: negative   PHYSICAL EXAMINATION: General appearance: alert, cooperative, fatigued and no distress Head: Normocephalic, without obvious abnormality, atraumatic Neck: no adenopathy, no JVD, supple, symmetrical, trachea midline and thyroid not enlarged, symmetric, no tenderness/mass/nodules Lymph nodes: Cervical, supraclavicular, and axillary nodes normal. Resp: clear to auscultation bilaterally Back: symmetric, no curvature. ROM normal. No CVA tenderness. Cardio: regular rate and rhythm, S1, S2 normal, no murmur, click, rub or gallop GI: soft, non-tender; bowel sounds normal; no masses,  no organomegaly Extremities: extremities normal, atraumatic, no cyanosis or edema and edema 1+ edema in the right lower extremity Neurologic: Alert and oriented X 3, normal strength and tone. Normal symmetric reflexes. Normal coordination and gait  ECOG PERFORMANCE STATUS: 1 - Symptomatic but completely ambulatory  Blood pressure 111/54, pulse 68, temperature 97.7 F (36.5 C), temperature source Oral, resp. rate 17, height 4' 9"  (1.448 m), weight 80 lb 11.2 oz (36.605 kg), SpO2 100.00%.  LABORATORY DATA: Lab Results  Component Value Date   WBC 4.0 12/12/2013   HGB 11.0* 12/12/2013   HCT 34.4* 12/12/2013   MCV 90.9 12/12/2013   PLT 180 12/12/2013      Chemistry      Component Value Date/Time   NA 138 06/12/2013 1122   NA 136 09/05/2011 1558   K 3.6 06/12/2013 1122   K 3.0* 09/05/2011 1558   CL 101 09/05/2011 1558   CO2 22 06/12/2013 1122   CO2 27 09/05/2011 1558   BUN 22.5 06/12/2013 1122   BUN 34* 09/05/2011 1558   CREATININE 1.6* 06/12/2013 1122   CREATININE 1.13* 09/05/2011 1558   CREATININE 1.04 08/22/2011 1106      Component Value Date/Time   CALCIUM 8.7 06/12/2013 1122   CALCIUM 8.8 09/05/2011 1558   ALKPHOS 53 06/12/2013 1122   ALKPHOS 44 09/05/2011 1558   AST 11 06/12/2013 1122   AST 16 09/05/2011 1558   ALT 11 06/12/2013 1122   ALT 11 09/05/2011 1558   BILITOT 0.31  06/12/2013 1122   BILITOT 0.2* 09/05/2011 1558        RADIOGRAPHIC STUDIES: No results found.   ASSESSMENT AND PLAN: this is a very pleasant 73 years old white female with history of iron deficiency anemia/anemia of chronic disease secondary to malabsorption and Crohn's disease. The patient is tolerating her current treatment with Aranesp and vitamin B12 fairly well. We will discontinue the treatment with Aranesp based on her request. Her iron study today is still pending today. Her hemoglobin and hematocrit are much better after the PRBCs transfusion 2 weeks ago. I recommended for the patient to continue her current treatment with vitamin B12 every 4 weeks. I would see her back for follow up visit in 3 months with repeat CBC and iron study She was advised to call immediately if she has any concerning symptoms in the interval. The patient will continue her treatment with intravenous Vedolizumab under the care of Dr. Emelda Fear at [facility]. The patient voices understanding of current  disease status and treatment options and is in agreement with the current care plan. All questions were answered. The patient knows to call the clinic with any problems, questions or concerns. We can certainly see the patient much sooner if necessary.

## 2013-12-17 ENCOUNTER — Other Ambulatory Visit: Payer: Medicare Other

## 2013-12-17 ENCOUNTER — Ambulatory Visit: Payer: Medicare Other | Admitting: Internal Medicine

## 2014-01-07 ENCOUNTER — Telehealth: Payer: Self-pay | Admitting: Internal Medicine

## 2014-01-07 NOTE — Telephone Encounter (Signed)
pt called to r/s appt..done...pt aware of new d.t

## 2014-01-08 ENCOUNTER — Ambulatory Visit (HOSPITAL_BASED_OUTPATIENT_CLINIC_OR_DEPARTMENT_OTHER): Payer: Medicare Other

## 2014-01-08 VITALS — BP 116/57 | HR 60 | Temp 97.6°F

## 2014-01-08 DIAGNOSIS — K5090 Crohn's disease, unspecified, without complications: Secondary | ICD-10-CM

## 2014-01-08 DIAGNOSIS — D638 Anemia in other chronic diseases classified elsewhere: Secondary | ICD-10-CM

## 2014-01-08 MED ORDER — CYANOCOBALAMIN 1000 MCG/ML IJ SOLN
1000.0000 ug | Freq: Once | INTRAMUSCULAR | Status: AC
  Administered 2014-01-08: 1000 ug via INTRAMUSCULAR

## 2014-01-09 ENCOUNTER — Ambulatory Visit: Payer: Medicare Other

## 2014-02-17 ENCOUNTER — Ambulatory Visit (HOSPITAL_BASED_OUTPATIENT_CLINIC_OR_DEPARTMENT_OTHER): Payer: Medicare Other

## 2014-02-17 ENCOUNTER — Ambulatory Visit: Payer: Medicare Other

## 2014-02-17 VITALS — BP 139/58 | HR 49 | Temp 97.6°F

## 2014-02-17 DIAGNOSIS — Z23 Encounter for immunization: Secondary | ICD-10-CM

## 2014-02-17 DIAGNOSIS — K5090 Crohn's disease, unspecified, without complications: Secondary | ICD-10-CM

## 2014-02-17 DIAGNOSIS — D638 Anemia in other chronic diseases classified elsewhere: Secondary | ICD-10-CM

## 2014-02-17 MED ORDER — INFLUENZA VAC SPLIT QUAD 0.5 ML IM SUSY
0.5000 mL | PREFILLED_SYRINGE | Freq: Once | INTRAMUSCULAR | Status: AC
  Administered 2014-02-17: 0.5 mL via INTRAMUSCULAR
  Filled 2014-02-17: qty 0.5

## 2014-02-17 MED ORDER — CYANOCOBALAMIN 1000 MCG/ML IJ SOLN
1000.0000 ug | Freq: Once | INTRAMUSCULAR | Status: AC
  Administered 2014-02-17: 1000 ug via INTRAMUSCULAR

## 2014-03-13 ENCOUNTER — Ambulatory Visit: Payer: Medicare Other | Admitting: Internal Medicine

## 2014-03-13 ENCOUNTER — Ambulatory Visit: Payer: Medicare Other

## 2014-03-13 ENCOUNTER — Other Ambulatory Visit: Payer: Medicare Other

## 2014-03-14 ENCOUNTER — Other Ambulatory Visit: Payer: Self-pay | Admitting: *Deleted

## 2014-03-14 DIAGNOSIS — D509 Iron deficiency anemia, unspecified: Secondary | ICD-10-CM

## 2014-03-17 ENCOUNTER — Ambulatory Visit (HOSPITAL_BASED_OUTPATIENT_CLINIC_OR_DEPARTMENT_OTHER): Payer: Medicare Other | Admitting: Internal Medicine

## 2014-03-17 ENCOUNTER — Other Ambulatory Visit (HOSPITAL_BASED_OUTPATIENT_CLINIC_OR_DEPARTMENT_OTHER): Payer: Medicare Other

## 2014-03-17 ENCOUNTER — Encounter: Payer: Self-pay | Admitting: Internal Medicine

## 2014-03-17 ENCOUNTER — Ambulatory Visit: Payer: Medicare Other

## 2014-03-17 ENCOUNTER — Telehealth: Payer: Self-pay | Admitting: Internal Medicine

## 2014-03-17 VITALS — BP 131/34 | HR 50 | Temp 97.6°F | Resp 18 | Ht <= 58 in | Wt 84.9 lb

## 2014-03-17 DIAGNOSIS — D509 Iron deficiency anemia, unspecified: Secondary | ICD-10-CM

## 2014-03-17 DIAGNOSIS — K5090 Crohn's disease, unspecified, without complications: Secondary | ICD-10-CM

## 2014-03-17 DIAGNOSIS — D638 Anemia in other chronic diseases classified elsewhere: Secondary | ICD-10-CM

## 2014-03-17 DIAGNOSIS — D508 Other iron deficiency anemias: Secondary | ICD-10-CM

## 2014-03-17 DIAGNOSIS — K909 Intestinal malabsorption, unspecified: Secondary | ICD-10-CM

## 2014-03-17 LAB — CBC WITH DIFFERENTIAL/PLATELET
BASO%: 0.6 % (ref 0.0–2.0)
BASOS ABS: 0 10*3/uL (ref 0.0–0.1)
EOS ABS: 0.1 10*3/uL (ref 0.0–0.5)
EOS%: 3.6 % (ref 0.0–7.0)
HCT: 30 % — ABNORMAL LOW (ref 34.8–46.6)
HGB: 9.7 g/dL — ABNORMAL LOW (ref 11.6–15.9)
LYMPH%: 20.5 % (ref 14.0–49.7)
MCH: 29.9 pg (ref 25.1–34.0)
MCHC: 32.3 g/dL (ref 31.5–36.0)
MCV: 92.5 fL (ref 79.5–101.0)
MONO#: 0.3 10*3/uL (ref 0.1–0.9)
MONO%: 7.4 % (ref 0.0–14.0)
NEUT%: 67.9 % (ref 38.4–76.8)
NEUTROS ABS: 2.5 10*3/uL (ref 1.5–6.5)
Platelets: 165 10*3/uL (ref 145–400)
RBC: 3.24 10*6/uL — AB (ref 3.70–5.45)
RDW: 13.9 % (ref 11.2–14.5)
WBC: 3.7 10*3/uL — ABNORMAL LOW (ref 3.9–10.3)
lymph#: 0.8 10*3/uL — ABNORMAL LOW (ref 0.9–3.3)

## 2014-03-17 LAB — HOLD TUBE, BLOOD BANK

## 2014-03-17 LAB — FERRITIN CHCC: FERRITIN: 326 ng/mL — AB (ref 9–269)

## 2014-03-17 LAB — IRON AND TIBC CHCC
%SAT: 28 % (ref 21–57)
Iron: 60 ug/dL (ref 41–142)
TIBC: 215 ug/dL — ABNORMAL LOW (ref 236–444)
UIBC: 155 ug/dL (ref 120–384)

## 2014-03-17 MED ORDER — CYANOCOBALAMIN 1000 MCG/ML IJ SOLN: 1000.0000 ug | Freq: Once | INTRAMUSCULAR | Status: DC

## 2014-03-17 MED ORDER — CYANOCOBALAMIN 1000 MCG/ML IJ SOLN: 1000.0000 ug | INTRAMUSCULAR | Status: DC

## 2014-03-17 NOTE — Telephone Encounter (Signed)
Pt confirmed labs/ov per 10/12 POF, gave pt AVS.... KJ

## 2014-03-17 NOTE — Progress Notes (Signed)
[place] [phone]   [phone]  OFFICE PROGRESS NOTE  [name], MD [address] [facility] [address] 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   PRIOR THERAPY:  1) PRBCs transfusion as needed last was given few days ago.   CURRENT THERAPY:  1) Intravenous Feraheme infusion on as-needed basis. Last dose was given on 12/20/2011, and the patient is scheduled for another treatment tomorrow.  2) vitamin B12 injection on monthly basis.  INTERVAL HISTORY: Kim [name] 73 y.o. female returns to the clinic today for routine three-month followup visit. The patient is doing fine today. She currently on treatment with Vedolizumab for Crohn's disease at [facility] since December 2014 and she is tolerating it well. She continues to have mild bleeding around the ostomy site. She would like to continue with the vitamin B12 injection monthly but she prefers to take her injection at the [facility] and requested prescription to be sent there. Her concerned as copayment of $60 for administration she at the [place]. The patient had repeat CBC and iron study performed today and she is here for evaluation and discussion of her lab results.   MEDICAL HISTORY: Past Medical History  Diagnosis Date  . Osteoporosis     managed by Dr. Nevada Crane at [facility], [place] q2 yrs  . OA (osteoarthritis)     hands,back  . Crohn disease     Dr. Emelda Fear at [place]  . Glaucoma     Dr. Janyth Contes  . Intestinal malabsorption   . Anemia     related to malabsorption--gets B12 shots and iron infusions, managed by Dr. Julien Nordmann  . BCC (basal cell carcinoma of skin)     ALLERGIES:  is allergic to antihistamines, chlorpheniramine-type; metronidazole; and sulfa antibiotics.  MEDICATIONS:  Current Outpatient Prescriptions  Medication Sig Dispense Refill  . acetaminophen  (TYLENOL) 500 MG tablet Take 500 mg by mouth every 6 (six) hours as needed for pain.      . calcium citrate (CALCITRATE - DOSED IN MG ELEMENTAL CALCIUM) 950 MG tablet Take 200 mg of elemental calcium by mouth daily.      . cholecalciferol (VITAMIN D) 1000 UNITS tablet Take 1,000 Units by mouth daily.      . ciprofloxacin (CIPRO) 500 MG tablet Take 500 mg by mouth 2 (two) times daily.      . dorzolamide-timolol (COSOPT) 22.3-6.8 MG/ML ophthalmic solution 1 drop daily.        Marland Kitchen gabapentin (NEURONTIN) 300 MG capsule Take by mouth.      . latanoprost (XALATAN) 0.005 % ophthalmic solution Apply 1 drop to eye daily. Place into both eyes Daily.      . Vedolizumab (ENTYVIO IV) Inject into the vein. Will be getting at [facility]      . vitamin B-12 (CYANOCOBALAMIN) 1000 MCG tablet Inject 1,000 mcg as directed every 30 (thirty) days.       . [DISCONTINUED] potassium chloride 40 MEQ/15ML (20%) LIQD Take 15 mLs (40 mEq total) by mouth as directed.  150 mL  0   No current facility-administered medications for this visit.   Facility-Administered Medications Ordered in Other Visits  Medication Dose Route Frequency Provider Last Rate Last Dose  . acetaminophen (TYLENOL) tablet 650 mg  650 mg Oral Once [employer], PA-C      . diphenhydrAMINE (BENADRYL) capsule 25 mg  25 mg  Oral Once Carlton Adam, PA-C        SURGICAL HISTORY:  Past Surgical History  Procedure Laterality Date  . Total colectomy  1984    due to Crohn's  . Internal ostomy pouch  1994  . Crohn  2009    REVIEW OF SYSTEMS:  Constitutional: positive for fatigue Eyes: negative Ears, nose, mouth, throat, and face: negative Respiratory: negative Cardiovascular: negative Gastrointestinal: negative Genitourinary:negative Integument/breast: negative Hematologic/lymphatic: negative Musculoskeletal:negative Neurological: negative Behavioral/Psych: negative Endocrine: negative Allergic/Immunologic: negative   PHYSICAL EXAMINATION:  General appearance: alert, cooperative, fatigued and no distress Head: Normocephalic, without obvious abnormality, atraumatic Neck: no adenopathy, no JVD, supple, symmetrical, trachea midline and thyroid not enlarged, symmetric, no tenderness/mass/nodules Lymph nodes: Cervical, supraclavicular, and axillary nodes normal. Resp: clear to auscultation bilaterally Back: symmetric, no curvature. ROM normal. No CVA tenderness. Cardio: regular rate and rhythm, S1, S2 normal, no murmur, click, rub or gallop GI: soft, non-tender; bowel sounds normal; no masses,  no organomegaly Extremities: extremities normal, atraumatic, no cyanosis or edema and edema 1+ edema in the right lower extremity Neurologic: Alert and oriented X 3, normal strength and tone. Normal symmetric reflexes. Normal coordination and gait  ECOG PERFORMANCE STATUS: 1 - Symptomatic but completely ambulatory  Blood pressure 131/34, pulse 50, temperature 97.6 F (36.4 C), resp. rate 18, height 4' 9"  (1.448 m), weight 84 lb 14.4 oz (38.51 kg).  LABORATORY DATA: Lab Results  Component Value Date   WBC 3.7* 03/17/2014   HGB 9.7* 03/17/2014   HCT 30.0* 03/17/2014   MCV 92.5 03/17/2014   PLT 165 03/17/2014      Chemistry      Component Value Date/Time   NA 138 06/12/2013 1122   NA 136 09/05/2011 1558   K 3.6 06/12/2013 1122   K 3.0* 09/05/2011 1558   CL 101 09/05/2011 1558   CO2 22 06/12/2013 1122   CO2 27 09/05/2011 1558   BUN 22.5 06/12/2013 1122   BUN 34* 09/05/2011 1558   CREATININE 1.6* 06/12/2013 1122   CREATININE 1.13* 09/05/2011 1558   CREATININE 1.04 08/22/2011 1106      Component Value Date/Time   CALCIUM 8.7 06/12/2013 1122   CALCIUM 8.8 09/05/2011 1558   ALKPHOS 53 06/12/2013 1122   ALKPHOS 44 09/05/2011 1558   AST 11 06/12/2013 1122   AST 16 09/05/2011 1558   ALT 11 06/12/2013 1122   ALT 11 09/05/2011 1558   BILITOT 0.31 06/12/2013 1122   BILITOT 0.2* 09/05/2011 1558        RADIOGRAPHIC STUDIES: No results found.   ASSESSMENT AND PLAN:  this is a very pleasant 73 years old white female with history of iron deficiency anemia/anemia of chronic disease secondary to malabsorption and Crohn's disease. Her CBC showed low but stable hemoglobin and hematocrit. Iron study is unremarkable today for any iron deficiency. I discussed the lab result with the patient. I recommended for her to continue on observation for now with repeat CBC and iron study in 2 months. I sent a prescription for vitamin B12 1000 mcg intramuscularly on a [facility] basis [facility] based on the patient his request. I would see her back for follow up visit in 3 months with repeat CBC and iron study She was advised to call immediately if she has any concerning symptoms in the interval. The patient will continue her treatment with intravenous Vedolizumab under the care of Dr. Emelda Fear at [facility]. The patient voices understanding of current disease status and treatment options  and is in agreement with the current care plan. All questions were answered. The patient knows to call the clinic with any problems, questions or concerns. We can certainly see the patient much sooner if necessary.  Disclaimer: This note was dictated with voice recognition software. Similar sounding words can inadvertently be transcribed and may be missed upon review.

## 2014-03-19 ENCOUNTER — Encounter: Payer: Medicare Other | Admitting: Internal Medicine

## 2014-03-31 ENCOUNTER — Encounter: Payer: Medicare Other | Admitting: Internal Medicine

## 2014-03-31 DIAGNOSIS — Z0289 Encounter for other administrative examinations: Secondary | ICD-10-CM

## 2014-04-05 ENCOUNTER — Encounter: Payer: Self-pay | Admitting: Internal Medicine

## 2014-05-12 ENCOUNTER — Other Ambulatory Visit (HOSPITAL_BASED_OUTPATIENT_CLINIC_OR_DEPARTMENT_OTHER): Payer: Medicare Other

## 2014-05-12 DIAGNOSIS — D638 Anemia in other chronic diseases classified elsewhere: Secondary | ICD-10-CM

## 2014-05-12 DIAGNOSIS — D509 Iron deficiency anemia, unspecified: Secondary | ICD-10-CM

## 2014-05-12 LAB — CBC WITH DIFFERENTIAL/PLATELET
BASO%: 0.7 % (ref 0.0–2.0)
Basophils Absolute: 0 10*3/uL (ref 0.0–0.1)
EOS ABS: 0.1 10*3/uL (ref 0.0–0.5)
EOS%: 3.8 % (ref 0.0–7.0)
HCT: 29.6 % — ABNORMAL LOW (ref 34.8–46.6)
HEMOGLOBIN: 9.3 g/dL — AB (ref 11.6–15.9)
LYMPH#: 0.7 10*3/uL — AB (ref 0.9–3.3)
LYMPH%: 22 % (ref 14.0–49.7)
MCH: 29 pg (ref 25.1–34.0)
MCHC: 31.6 g/dL (ref 31.5–36.0)
MCV: 91.7 fL (ref 79.5–101.0)
MONO#: 0.4 10*3/uL (ref 0.1–0.9)
MONO%: 12.9 % (ref 0.0–14.0)
NEUT%: 60.6 % (ref 38.4–76.8)
NEUTROS ABS: 1.9 10*3/uL (ref 1.5–6.5)
Platelets: 158 10*3/uL (ref 145–400)
RBC: 3.23 10*6/uL — ABNORMAL LOW (ref 3.70–5.45)
RDW: 15.4 % — AB (ref 11.2–14.5)
WBC: 3.1 10*3/uL — AB (ref 3.9–10.3)

## 2014-05-12 LAB — IRON AND TIBC CHCC
%SAT: 22 % (ref 21–57)
IRON: 46 ug/dL (ref 41–142)
TIBC: 204 ug/dL — AB (ref 236–444)
UIBC: 158 ug/dL (ref 120–384)

## 2014-05-12 LAB — FERRITIN CHCC: Ferritin: 309 ng/ml — ABNORMAL HIGH (ref 9–269)

## 2014-05-19 ENCOUNTER — Ambulatory Visit (HOSPITAL_BASED_OUTPATIENT_CLINIC_OR_DEPARTMENT_OTHER): Payer: Medicare Other | Admitting: Internal Medicine

## 2014-05-19 ENCOUNTER — Telehealth: Payer: Self-pay | Admitting: Internal Medicine

## 2014-05-19 ENCOUNTER — Encounter: Payer: Self-pay | Admitting: Internal Medicine

## 2014-05-19 VITALS — BP 99/52 | HR 68 | Temp 98.2°F | Resp 17 | Ht <= 58 in | Wt 82.6 lb

## 2014-05-19 DIAGNOSIS — D509 Iron deficiency anemia, unspecified: Secondary | ICD-10-CM

## 2014-05-19 DIAGNOSIS — D508 Other iron deficiency anemias: Secondary | ICD-10-CM

## 2014-05-19 DIAGNOSIS — K5090 Crohn's disease, unspecified, without complications: Secondary | ICD-10-CM

## 2014-05-19 DIAGNOSIS — K909 Intestinal malabsorption, unspecified: Secondary | ICD-10-CM

## 2014-05-19 NOTE — Telephone Encounter (Signed)
Gave avs & cal for Feb.

## 2014-05-19 NOTE — Progress Notes (Signed)
[place] Telephone:([phone]   Fax:(336) [phone]  OFFICE PROGRESS NOTE  [name], MD [address] 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   PRIOR THERAPY:  1) PRBCs transfusion as needed last was given few days ago.   CURRENT THERAPY:  1) Intravenous Feraheme infusion on as-needed basis. Last dose was given on 12/20/2011, and the patient is scheduled for another treatment tomorrow.  2) vitamin B12 injection on monthly basis.  INTERVAL HISTORY: Kim [name] 73 y.o. female returns to the clinic today for routine 2 months followup visit. The patient is doing fine today except for mild fatigue. She currently on treatment with Vedolizumab for Crohn's disease at [facility] since December 2014 and she is tolerating it well. She continues to have mild bleeding around the ostomy site which is contributing to her anemia. She is currently doing the vitamin B 12 injection at home. It costs her around $3 compared to $60 at the [place] The patient had repeat CBC and iron study performed today and she is here for evaluation and discussion of her lab results.   MEDICAL HISTORY: Past Medical History  Diagnosis Date  . Osteoporosis     managed by Dr. Nevada Crane at [facility], [place] q2 yrs  . OA (osteoarthritis)     hands,back  . Crohn disease     Dr. Emelda Fear at [facility]  . Glaucoma     Dr. Janyth Contes  . Intestinal malabsorption   . Anemia     related to malabsorption--gets B12 shots and iron infusions, managed by Dr. Julien Nordmann  . BCC (basal cell carcinoma of skin)     ALLERGIES:  is allergic to antihistamines, chlorpheniramine-type; metronidazole; and sulfa antibiotics.  MEDICATIONS:  Current Outpatient Prescriptions  Medication Sig Dispense Refill  . acetaminophen (TYLENOL) 500 MG tablet Take 500 mg by mouth every 6 (six) hours as  needed for pain.    . calcium citrate (CALCITRATE - DOSED IN MG ELEMENTAL CALCIUM) 950 MG tablet Take 1,200 mg of elemental calcium by mouth daily.     . cholecalciferol (VITAMIN D) 1000 UNITS tablet Take 2,000 Units by mouth daily.     . ciprofloxacin (CIPRO) 500 MG tablet Take 500 mg by mouth 2 (two) times daily.    . dorzolamide-timolol (COSOPT) 22.3-6.8 MG/ML ophthalmic solution 1 drop daily.      Marland Kitchen gabapentin (NEURONTIN) 300 MG capsule Take by mouth.    . latanoprost (XALATAN) 0.005 % ophthalmic solution Apply 1 drop to eye daily. Place into both eyes Daily.    . Vedolizumab (ENTYVIO IV) Inject into the vein. Has treatments at [facility]    . vitamin B-12 (CYANOCOBALAMIN) 1000 MCG tablet Inject 1,000 mcg as directed every 30 (thirty) days.     . [DISCONTINUED] potassium chloride 40 MEQ/15ML (20%) LIQD Take 15 mLs (40 mEq total) by mouth as directed. 150 mL 0   No current facility-administered medications for this visit.   Facility-Administered Medications Ordered in Other Visits  Medication Dose Route Frequency Provider Last Rate Last Dose  . acetaminophen (TYLENOL) tablet 650 mg  650 mg Oral Once [employer], PA-C   650 mg at 09/21/12 1522  . diphenhydrAMINE (BENADRYL) capsule 25 mg  25 mg Oral Once Carlton Adam, PA-C   25 mg at 09/21/12 1522    SURGICAL HISTORY:  Past Surgical History  Procedure  Laterality Date  . Total colectomy  1984    due to Crohn's  . Internal ostomy pouch  1994  . Crohn  2009    REVIEW OF SYSTEMS:  Constitutional: positive for fatigue Eyes: negative Ears, nose, mouth, throat, and face: negative Respiratory: negative Cardiovascular: negative Gastrointestinal: negative Genitourinary:negative Integument/breast: negative Hematologic/lymphatic: negative Musculoskeletal:negative Neurological: negative Behavioral/Psych: negative Endocrine: negative Allergic/Immunologic: negative   PHYSICAL EXAMINATION: General appearance: alert, cooperative,  fatigued and no distress Head: Normocephalic, without obvious abnormality, atraumatic Neck: no adenopathy, no JVD, supple, symmetrical, trachea midline and thyroid not enlarged, symmetric, no tenderness/mass/nodules Lymph nodes: Cervical, supraclavicular, and axillary nodes normal. Resp: clear to auscultation bilaterally Back: symmetric, no curvature. ROM normal. No CVA tenderness. Cardio: regular rate and rhythm, S1, S2 normal, no murmur, click, rub or gallop GI: soft, non-tender; bowel sounds normal; no masses,  no organomegaly Extremities: extremities normal, atraumatic, no cyanosis or edema and edema 1+ edema in the right lower extremity Neurologic: Alert and oriented X 3, normal strength and tone. Normal symmetric reflexes. Normal coordination and gait  ECOG PERFORMANCE STATUS: 1 - Symptomatic but completely ambulatory  Blood pressure 99/52, pulse 68, temperature 98.2 F (36.8 C), temperature source Oral, resp. rate 17, height 4' 9"  (1.448 m), weight 82 lb 9.6 oz (37.467 kg), SpO2 100 %.  LABORATORY DATA: Lab Results  Component Value Date   WBC 3.1* 05/12/2014   HGB 9.3* 05/12/2014   HCT 29.6* 05/12/2014   MCV 91.7 05/12/2014   PLT 158 05/12/2014      Chemistry      Component Value Date/Time   NA 138 06/12/2013 1122   NA 136 09/05/2011 1558   K 3.6 06/12/2013 1122   K 3.0* 09/05/2011 1558   CL 101 09/05/2011 1558   CO2 22 06/12/2013 1122   CO2 27 09/05/2011 1558   BUN 22.5 06/12/2013 1122   BUN 34* 09/05/2011 1558   CREATININE 1.6* 06/12/2013 1122   CREATININE 1.13* 09/05/2011 1558   CREATININE 1.04 08/22/2011 1106      Component Value Date/Time   CALCIUM 8.7 06/12/2013 1122   CALCIUM 8.8 09/05/2011 1558   ALKPHOS 53 06/12/2013 1122   ALKPHOS 44 09/05/2011 1558   AST 11 06/12/2013 1122   AST 16 09/05/2011 1558   ALT 11 06/12/2013 1122   ALT 11 09/05/2011 1558   BILITOT 0.31 06/12/2013 1122   BILITOT 0.2* 09/05/2011 1558        RADIOGRAPHIC STUDIES: No  results found.   ASSESSMENT AND PLAN: this is a very pleasant 73 years old white female with history of iron deficiency anemia/anemia of chronic disease secondary to malabsorption and Crohn's disease. Her CBC showed low but stable hemoglobin and hematocrit. Iron study is trending down and the patient is worried about having more anemia in the next few weeks. I discussed the lab result with the patient. I recommended for her to consider treatment with Feraheme 510 mg IV weekly 2. She will call to today to give [place] the best time for her iron infusion so we can arrange for the appointment.  I will see her back for follow-up visit with repeat CBC and iron study in 2 months. She will continue vitamin B12 1000 mcg intramuscularly on a monthly basis outpatient basis given at home. She was advised to call immediately if she has any concerning symptoms in the interval. The patient will continue her treatment with intravenous Vedolizumab under the care of Dr. Emelda Fear at [facility].  The patient voices understanding  of current disease status and treatment options and is in agreement with the current care plan. All questions were answered. The patient knows to call the clinic with any problems, questions or concerns. We can certainly see the patient much sooner if necessary.  Disclaimer: This note was dictated with voice recognition software. Similar sounding words can inadvertently be transcribed and may be missed upon review.

## 2014-05-21 ENCOUNTER — Other Ambulatory Visit: Payer: Self-pay

## 2014-05-21 ENCOUNTER — Telehealth: Payer: Self-pay | Admitting: Internal Medicine

## 2014-05-21 ENCOUNTER — Telehealth: Payer: Self-pay | Admitting: *Deleted

## 2014-05-21 DIAGNOSIS — Z1231 Encounter for screening mammogram for malignant neoplasm of breast: Secondary | ICD-10-CM

## 2014-05-21 NOTE — Telephone Encounter (Signed)
Per Dr Vista Mink, pt was to call and give [place] appt requests for her feraheme infusions.  Pt called and requested 12/17 and 12/24, onc tx schedule sent to the schedulers.

## 2014-05-21 NOTE — Telephone Encounter (Signed)
lvm for pt regarding to [place] appts

## 2014-05-22 ENCOUNTER — Ambulatory Visit (HOSPITAL_BASED_OUTPATIENT_CLINIC_OR_DEPARTMENT_OTHER): Payer: Medicare Other

## 2014-05-22 DIAGNOSIS — D508 Other iron deficiency anemias: Secondary | ICD-10-CM

## 2014-05-22 DIAGNOSIS — K909 Intestinal malabsorption, unspecified: Secondary | ICD-10-CM

## 2014-05-22 DIAGNOSIS — D638 Anemia in other chronic diseases classified elsewhere: Secondary | ICD-10-CM

## 2014-05-22 DIAGNOSIS — K5090 Crohn's disease, unspecified, without complications: Secondary | ICD-10-CM

## 2014-05-22 MED ORDER — SODIUM CHLORIDE 0.9 % IV SOLN
510.0000 mg | Freq: Once | INTRAVENOUS | Status: AC
  Administered 2014-05-22: 510 mg via INTRAVENOUS
  Filled 2014-05-22: qty 17

## 2014-05-22 MED ORDER — FERUMOXYTOL INJECTION 510 MG/17 ML: 510.0000 mg | Freq: Once | INTRAVENOUS | Status: DC

## 2014-05-22 NOTE — Patient Instructions (Signed)

## 2014-05-22 NOTE — Progress Notes (Signed)
Observed pt 30 min post feraheme transfusion with no complications.

## 2014-05-29 ENCOUNTER — Ambulatory Visit (HOSPITAL_BASED_OUTPATIENT_CLINIC_OR_DEPARTMENT_OTHER): Payer: Medicare Other

## 2014-05-29 ENCOUNTER — Other Ambulatory Visit: Payer: Self-pay | Admitting: Internal Medicine

## 2014-05-29 DIAGNOSIS — D638 Anemia in other chronic diseases classified elsewhere: Secondary | ICD-10-CM

## 2014-05-29 DIAGNOSIS — D509 Iron deficiency anemia, unspecified: Secondary | ICD-10-CM

## 2014-05-29 MED ORDER — SODIUM CHLORIDE 0.9 % IV SOLN
Freq: Once | INTRAVENOUS | Status: AC
  Administered 2014-05-29: 10:00:00 via INTRAVENOUS

## 2014-05-29 MED ORDER — FERUMOXYTOL INJECTION 510 MG/17 ML
510.0000 mg | Freq: Once | INTRAVENOUS | Status: AC
  Administered 2014-05-29: 510 mg via INTRAVENOUS
  Filled 2014-05-29: qty 17

## 2014-05-29 NOTE — Patient Instructions (Signed)

## 2014-06-04 ENCOUNTER — Encounter: Payer: Medicare Other | Admitting: Internal Medicine

## 2014-06-09 ENCOUNTER — Ambulatory Visit (INDEPENDENT_AMBULATORY_CARE_PROVIDER_SITE_OTHER): Payer: Medicare HMO | Admitting: Internal Medicine

## 2014-06-09 ENCOUNTER — Encounter: Payer: Self-pay | Admitting: Internal Medicine

## 2014-06-09 VITALS — BP 106/54 | HR 60 | Temp 97.8°F | Resp 12 | Ht <= 58 in | Wt 80.8 lb

## 2014-06-09 DIAGNOSIS — K50813 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: Secondary | ICD-10-CM

## 2014-06-09 DIAGNOSIS — D509 Iron deficiency anemia, unspecified: Secondary | ICD-10-CM

## 2014-06-09 DIAGNOSIS — M810 Age-related osteoporosis without current pathological fracture: Secondary | ICD-10-CM

## 2014-06-09 NOTE — Patient Instructions (Signed)
We will see you back next year. If you have any problems or questions before then please call our office.   You are doing well and keep going to the gym.

## 2014-06-09 NOTE — Progress Notes (Signed)
Pre visit review using our clinic review tool, if applicable. No additional management support is needed unless otherwise documented below in the visit note. 

## 2014-06-09 NOTE — Progress Notes (Signed)
   Subjective:    Patient ID: Kim [name], female    DOB: 1941-04-21, 74 y.o.   MRN: [id number]  HPI The patient is a 74 YO female who comes in today to establish care. She has PMH of crohn's disease (severe, s/p resection), chronic anemia (iron and B12 s/p resection), osteoporosis (did not tolerate fosamax with jaw problems). She is doing well overall and is going to the gym 3 times a week. She still follows with [facility] for her Crohn's and it is fairly stable. She is getting infusions every 2 months. She follows with hematology for low blood counts and nutritional deficiency. She is not feeling SOB or too fatigued. She denies chest pains or SOB. Some imbalance with everyday activities but she is very careful and has not fallen.   Review of Systems  Constitutional: Negative for fever, activity change, appetite change, fatigue and unexpected weight change.  HENT: Negative.   Respiratory: Negative for cough, chest tightness, shortness of breath and wheezing.   Cardiovascular: Negative for chest pain, palpitations and leg swelling.  Gastrointestinal: Positive for diarrhea. Negative for abdominal pain, constipation and abdominal distention.       Loose from her pouch.  Musculoskeletal: Positive for arthralgias. Negative for myalgias, back pain and gait problem.       Some arthritis in her thumb.   Skin: Negative.   Neurological: Negative.   Psychiatric/Behavioral: Negative.       Objective:   Physical Exam  Constitutional: She is oriented to person, place, and time. She appears well-developed and well-nourished.  Thin, short  HENT:  Head: Normocephalic and atraumatic.  Eyes: EOM are normal.  Neck: Normal range of motion.  Cardiovascular: Normal rate and regular rhythm.   Pulmonary/Chest: Effort normal and breath sounds normal. No respiratory distress. She has no wheezes. She has no rales.  Abdominal: Soft. Bowel sounds are normal.  Midline scar healed and no rash on the skin.     Musculoskeletal: She exhibits no edema.  Neurological: She is alert and oriented to person, place, and time. Coordination normal.   Filed Vitals:   06/09/14 1604  BP: 106/54  Pulse: 60  Temp: 97.8 F (36.6 C)  TempSrc: Oral  Resp: 12  Height: 4' 8"  (1.422 m)  Weight: 80 lb 12.8 oz (36.651 kg)  SpO2: 99%      Assessment & Plan:

## 2014-06-10 NOTE — Assessment & Plan Note (Signed)
Due for bone density next year and will follow.

## 2014-06-10 NOTE — Assessment & Plan Note (Signed)
Just got feraheme in December and due for follow up labs in February. No worsening clinical symptoms of anemia. Still doing B12 shots at home monthly.

## 2014-06-10 NOTE — Assessment & Plan Note (Signed)
Doing well for now with entyvio. With nutritional complications and needs iron and B12 supplementation.

## 2014-07-07 ENCOUNTER — Ambulatory Visit: Payer: Medicare Other

## 2014-07-10 ENCOUNTER — Ambulatory Visit
Admission: RE | Admit: 2014-07-10 | Discharge: 2014-07-10 | Disposition: A | Discharge status: Home / Self Care | Payer: Medicare HMO | Source: Ambulatory Visit

## 2014-07-10 ENCOUNTER — Encounter (INDEPENDENT_AMBULATORY_CARE_PROVIDER_SITE_OTHER): Payer: Self-pay

## 2014-07-10 DIAGNOSIS — Z1231 Encounter for screening mammogram for malignant neoplasm of breast: Secondary | ICD-10-CM

## 2014-07-14 ENCOUNTER — Telehealth: Payer: Self-pay | Admitting: *Deleted

## 2014-07-14 ENCOUNTER — Other Ambulatory Visit (HOSPITAL_BASED_OUTPATIENT_CLINIC_OR_DEPARTMENT_OTHER): Payer: Medicare HMO

## 2014-07-14 DIAGNOSIS — D509 Iron deficiency anemia, unspecified: Secondary | ICD-10-CM

## 2014-07-14 LAB — CBC WITH DIFFERENTIAL/PLATELET
BASO%: 0.7 % (ref 0.0–2.0)
Basophils Absolute: 0 10*3/uL (ref 0.0–0.1)
EOS%: 2.9 % (ref 0.0–7.0)
Eosinophils Absolute: 0.1 10*3/uL (ref 0.0–0.5)
HCT: 29.6 % — ABNORMAL LOW (ref 34.8–46.6)
HGB: 9.2 g/dL — ABNORMAL LOW (ref 11.6–15.9)
LYMPH%: 19.4 % (ref 14.0–49.7)
MCH: 30 pg (ref 25.1–34.0)
MCHC: 31.3 g/dL — ABNORMAL LOW (ref 31.5–36.0)
MCV: 95.9 fL (ref 79.5–101.0)
MONO#: 0.3 10*3/uL (ref 0.1–0.9)
MONO%: 8.8 % (ref 0.0–14.0)
NEUT%: 68.2 % (ref 38.4–76.8)
NEUTROS ABS: 2.4 10*3/uL (ref 1.5–6.5)
Platelets: 160 10*3/uL (ref 145–400)
RBC: 3.08 10*6/uL — AB (ref 3.70–5.45)
RDW: 17.1 % — AB (ref 11.2–14.5)
WBC: 3.6 10*3/uL — AB (ref 3.9–10.3)
lymph#: 0.7 10*3/uL — ABNORMAL LOW (ref 0.9–3.3)

## 2014-07-14 LAB — IRON AND TIBC CHCC
%SAT: 28 % (ref 21–57)
IRON: 56 ug/dL (ref 41–142)
TIBC: 200 ug/dL — AB (ref 236–444)
UIBC: 144 ug/dL (ref 120–384)

## 2014-07-14 LAB — FERRITIN CHCC: Ferritin: 624 ng/ml — ABNORMAL HIGH (ref 9–269)

## 2014-07-14 LAB — HOLD TUBE, BLOOD BANK

## 2014-07-14 NOTE — Telephone Encounter (Signed)
Hbg 9.2, per Dr Vista Mink no need to have a transfusion at this time.  Called and spoke to pt, she agrees that she does not feel she needs any blood at this time and is aware of her appt on 2/15.

## 2014-07-21 ENCOUNTER — Other Ambulatory Visit: Payer: Self-pay | Admitting: Medical Oncology

## 2014-07-21 ENCOUNTER — Ambulatory Visit: Payer: Medicare Other | Admitting: Internal Medicine

## 2014-07-22 ENCOUNTER — Telehealth: Payer: Self-pay | Admitting: Internal Medicine

## 2014-07-22 NOTE — Telephone Encounter (Signed)
S/W RE NEW APPT FOR 4/7. S/W DIANE TO CLARIFY THE 2 POF'S FROM 2/15. PT DID MISS APPT TO 2/15 AND NEEDS TO R/S. PER PT SINCE SHE HAD LABS IN OUR OFFICE AND W/DR Doug Sou SHE WOULD LIKE TO PUSH THIS APPT OUT TO 4/7. PER PT SHE WILL SEE DR Doug Sou AGAIN 4/1 AND WOULD LIKE LABS FROM [facility] AGAIN W/DR [facility] SO SHE IS NOT STUCK TO MANY TIMES. PT ASKED TO HAVE DESK NURSE CALL HER RE LABS. LMONVM FOR DIANE RE PT'S WISHES AND ASKED THAT SHE CALL PT.

## 2014-08-18 ENCOUNTER — Telehealth: Payer: Self-pay | Admitting: Internal Medicine

## 2014-08-18 NOTE — Telephone Encounter (Signed)
s.w. pt and advised on April 7 appt moved to 4.11 due to MD on pal..Marland KitchenMarland KitchenMarland Kitchenpt ok and aware

## 2014-09-02 ENCOUNTER — Telehealth: Payer: Self-pay | Admitting: *Deleted

## 2014-09-02 ENCOUNTER — Other Ambulatory Visit: Payer: Self-pay | Admitting: Medical Oncology

## 2014-09-02 ENCOUNTER — Other Ambulatory Visit (HOSPITAL_BASED_OUTPATIENT_CLINIC_OR_DEPARTMENT_OTHER): Payer: Medicare PPO

## 2014-09-02 ENCOUNTER — Telehealth: Payer: Self-pay | Admitting: Internal Medicine

## 2014-09-02 ENCOUNTER — Other Ambulatory Visit: Payer: Self-pay | Admitting: Physician Assistant

## 2014-09-02 DIAGNOSIS — D509 Iron deficiency anemia, unspecified: Secondary | ICD-10-CM

## 2014-09-02 LAB — IRON AND TIBC CHCC
%SAT: 27 % (ref 21–57)
IRON: 56 ug/dL (ref 41–142)
TIBC: 204 ug/dL — AB (ref 236–444)
UIBC: 148 ug/dL (ref 120–384)

## 2014-09-02 LAB — CBC WITH DIFFERENTIAL/PLATELET
BASO%: 0.4 % (ref 0.0–2.0)
Basophils Absolute: 0 10*3/uL (ref 0.0–0.1)
EOS%: 2 % (ref 0.0–7.0)
Eosinophils Absolute: 0.1 10*3/uL (ref 0.0–0.5)
HEMATOCRIT: 29.1 % — AB (ref 34.8–46.6)
HEMOGLOBIN: 9.2 g/dL — AB (ref 11.6–15.9)
LYMPH%: 19.6 % (ref 14.0–49.7)
MCH: 29.8 pg (ref 25.1–34.0)
MCHC: 31.7 g/dL (ref 31.5–36.0)
MCV: 94 fL (ref 79.5–101.0)
MONO#: 0.3 10*3/uL (ref 0.1–0.9)
MONO%: 8 % (ref 0.0–14.0)
NEUT#: 2.5 10*3/uL (ref 1.5–6.5)
NEUT%: 70 % (ref 38.4–76.8)
Platelets: 164 10*3/uL (ref 145–400)
RBC: 3.09 10*6/uL — ABNORMAL LOW (ref 3.70–5.45)
RDW: 15.5 % — ABNORMAL HIGH (ref 11.2–14.5)
WBC: 3.6 10*3/uL — ABNORMAL LOW (ref 3.9–10.3)
lymph#: 0.7 10*3/uL — ABNORMAL LOW (ref 0.9–3.3)

## 2014-09-02 LAB — FERRITIN CHCC: Ferritin: 428 ng/ml — ABNORMAL HIGH (ref 9–269)

## 2014-09-02 LAB — HOLD TUBE, BLOOD BANK

## 2014-09-02 NOTE — Telephone Encounter (Signed)
added appt per pof....pt aware

## 2014-09-02 NOTE — Telephone Encounter (Signed)
Patient states that she would like her labs to be drawn due to fatigue/weakness. Dr. Inda Merlin notified and agreed. POF sent to scheduler.

## 2014-09-03 ENCOUNTER — Other Ambulatory Visit: Payer: Self-pay | Admitting: *Deleted

## 2014-09-03 DIAGNOSIS — D509 Iron deficiency anemia, unspecified: Secondary | ICD-10-CM

## 2014-09-03 DIAGNOSIS — D638 Anemia in other chronic diseases classified elsewhere: Secondary | ICD-10-CM

## 2014-09-03 MED ORDER — CYANOCOBALAMIN 1000 MCG/ML IJ SOLN: 1000.0000 ug | Freq: Once | INTRAMUSCULAR | Status: DC

## 2014-09-03 NOTE — Telephone Encounter (Signed)
Reviewed CBC /diff with platelets

## 2014-09-03 NOTE — Telephone Encounter (Signed)
Patient requests a 90 day supply

## 2014-09-09 NOTE — Telephone Encounter (Signed)
error 

## 2014-09-11 ENCOUNTER — Ambulatory Visit: Payer: Medicare HMO | Admitting: Internal Medicine

## 2014-09-15 ENCOUNTER — Telehealth: Payer: Self-pay | Admitting: Internal Medicine

## 2014-09-15 ENCOUNTER — Encounter: Payer: Self-pay | Admitting: Internal Medicine

## 2014-09-15 ENCOUNTER — Ambulatory Visit (HOSPITAL_BASED_OUTPATIENT_CLINIC_OR_DEPARTMENT_OTHER): Payer: Medicare PPO | Admitting: Internal Medicine

## 2014-09-15 VITALS — BP 119/40 | HR 61 | Temp 97.8°F | Resp 17 | Ht <= 58 in | Wt 81.5 lb

## 2014-09-15 DIAGNOSIS — D509 Iron deficiency anemia, unspecified: Secondary | ICD-10-CM | POA: Diagnosis not present

## 2014-09-15 DIAGNOSIS — K5090 Crohn's disease, unspecified, without complications: Secondary | ICD-10-CM | POA: Diagnosis not present

## 2014-09-15 DIAGNOSIS — D638 Anemia in other chronic diseases classified elsewhere: Secondary | ICD-10-CM

## 2014-09-15 DIAGNOSIS — K909 Intestinal malabsorption, unspecified: Secondary | ICD-10-CM

## 2014-09-15 DIAGNOSIS — M5432 Sciatica, left side: Secondary | ICD-10-CM

## 2014-09-15 NOTE — Telephone Encounter (Signed)
gave and printed appt sched and avs fo rpt for July

## 2014-09-15 NOTE — Progress Notes (Signed)
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   PRIOR THERAPY:  1) PRBCs transfusion as needed last was given few days ago.   CURRENT THERAPY:  1) Intravenous Feraheme infusion on as-needed basis. Last dose was given on 12/20/2011, and the patient is scheduled for another treatment tomorrow.  2) vitamin B12 injection on monthly basis.  INTERVAL HISTORY: Kim [name] 74 y.o. female returns to the clinic today for routine 2 months followup visit. The patient is doing fine today except for mild fatigue and sciatica pain in the left hip area started 2 weeks ago. She is a stable on treatment with Vedolizumab for Crohn's disease at [facility] since December 2014 and she is tolerating it well. She is currently doing the vitamin B 12 injection at home. The patient had repeat CBC and iron study performed recently and she is here for evaluation and discussion of her lab results.   MEDICAL HISTORY: Past Medical History  Diagnosis Date  . Osteoporosis     managed by Dr. Nevada Crane at [facility], [place] q2 yrs  . OA (osteoarthritis)     hands,back  . Crohn disease     Dr. Emelda Fear at [facility]  . Glaucoma     Dr. Janyth Contes  . Intestinal malabsorption   . Anemia     related to malabsorption--gets B12 shots and iron infusions, managed by Dr. Julien Nordmann  . BCC (basal cell carcinoma of skin)     ALLERGIES:  is allergic to antihistamines, chlorpheniramine-type; metronidazole; and sulfa antibiotics.  MEDICATIONS:  Current Outpatient Prescriptions  Medication Sig Dispense Refill  . acetaminophen (TYLENOL) 500 MG tablet Take 500 mg by mouth every 6 (six) hours as needed for pain.    . calcium citrate (CALCITRATE - DOSED IN MG ELEMENTAL CALCIUM) 950 MG tablet Take 1,200 mg of  elemental calcium by mouth daily.     . cholecalciferol (VITAMIN D) 1000 UNITS tablet Take 2,000 Units by mouth daily.     . ciprofloxacin (CIPRO) 500 MG tablet Take 500 mg by mouth 2 (two) times daily.    . cyanocobalamin (,VITAMIN B-12,) 1000 MCG/ML injection Inject 1 mL (1,000 mcg total) into the muscle once. 3 mL 3  . dorzolamide-timolol (COSOPT) 22.3-6.8 MG/ML ophthalmic solution 1 drop daily.      Marland Kitchen gabapentin (NEURONTIN) 300 MG capsule Take 300 mg by mouth 3 (three) times daily.     Marland Kitchen latanoprost (XALATAN) 0.005 % ophthalmic solution Apply 1 drop to eye daily. Place into both eyes Daily.    . Melatonin 10 MG TBCR Take by mouth. Take one at bedtime    . simethicone (MYLICON) 80 MG chewable tablet Chew by mouth.    . Vedolizumab (ENTYVIO IV) Inject into the vein. Has treatments at [facility]    . [DISCONTINUED] potassium chloride 40 MEQ/15ML (20%) LIQD Take 15 mLs (40 mEq total) by mouth as directed. 150 mL 0   No current facility-administered medications for this visit.   Facility-Administered Medications Ordered in Other Visits  Medication Dose Route Frequency Provider Last Rate Last Dose  . acetaminophen (TYLENOL) tablet 650 mg  650 mg Oral Once [employer], PA-C   650 mg at 09/21/12 1522  . diphenhydrAMINE (BENADRYL) capsule 25 mg  25 mg Oral Once Carlton Adam, PA-C  25 mg at 09/21/12 1522    SURGICAL HISTORY:  Past Surgical History  Procedure Laterality Date  . Total colectomy  1984    due to Crohn's  . Internal ostomy pouch  1994  . Crohn  2009    REVIEW OF SYSTEMS:  A comprehensive review of systems was negative except for: Constitutional: positive for fatigue Musculoskeletal: positive for Sciatica pain   PHYSICAL EXAMINATION: General appearance: alert, cooperative, fatigued and no distress Head: Normocephalic, without obvious abnormality, atraumatic Neck: no adenopathy, no JVD, supple, symmetrical, trachea midline and thyroid not enlarged, symmetric, no  tenderness/mass/nodules Lymph nodes: Cervical, supraclavicular, and axillary nodes normal. Resp: clear to auscultation bilaterally Back: symmetric, no curvature. ROM normal. No CVA tenderness. Cardio: regular rate and rhythm, S1, S2 normal, no murmur, click, rub or gallop GI: soft, non-tender; bowel sounds normal; no masses,  no organomegaly Extremities: extremities normal, atraumatic, no cyanosis or edema and edema 1+ edema in the right lower extremity Neurologic: Alert and oriented X 3, normal strength and tone. Normal symmetric reflexes. Normal coordination and gait  ECOG PERFORMANCE STATUS: 1 - Symptomatic but completely ambulatory  Blood pressure 119/40, pulse 61, temperature 97.8 F (36.6 C), temperature source Oral, resp. rate 17, height 4' 8"  (1.422 m), weight 81 lb 8 oz (36.968 kg), SpO2 99 %.  LABORATORY DATA: Lab Results  Component Value Date   WBC 3.6* 09/02/2014   HGB 9.2* 09/02/2014   HCT 29.1* 09/02/2014   MCV 94.0 09/02/2014   PLT 164 09/02/2014      Chemistry      Component Value Date/Time   NA 138 06/12/2013 1122   NA 136 09/05/2011 1558   K 3.6 06/12/2013 1122   K 3.0* 09/05/2011 1558   CL 101 09/05/2011 1558   CO2 22 06/12/2013 1122   CO2 27 09/05/2011 1558   BUN 22.5 06/12/2013 1122   BUN 34* 09/05/2011 1558   CREATININE 1.6* 06/12/2013 1122   CREATININE 1.13* 09/05/2011 1558   CREATININE 1.04 08/22/2011 1106      Component Value Date/Time   CALCIUM 8.7 06/12/2013 1122   CALCIUM 8.8 09/05/2011 1558   ALKPHOS 53 06/12/2013 1122   ALKPHOS 44 09/05/2011 1558   AST 11 06/12/2013 1122   AST 16 09/05/2011 1558   ALT 11 06/12/2013 1122   ALT 11 09/05/2011 1558   BILITOT 0.31 06/12/2013 1122   BILITOT 0.2* 09/05/2011 1558     Other lab results: Ferritin 428, serum iron 56, total iron binding capacity 24 and iron saturation 27%.   RADIOGRAPHIC STUDIES: No results found.   ASSESSMENT AND PLAN: this is a very pleasant 74 years old white female with  history of iron deficiency anemia/anemia of chronic disease secondary to malabsorption and Crohn's disease. Her CBC showed low but stable hemoglobin and hematocrit as well as normal iron study and ferritin.  I will see her back for follow-up visit with repeat CBC and iron study in 3 months. She will continue vitamin B12 1000 mcg intramuscularly on a monthly basis outpatient basis given at home. For the sciatic pain, I advised the patient to see orthopedic surgeon for evaluation and consideration of steroid injections. She was advised to call immediately if she has any concerning symptoms in the interval. The patient will continue her treatment with intravenous Vedolizumab under the care of Dr. Emelda Fear at [facility].  The patient voices understanding of current disease status and treatment options and is in agreement with the current care plan. All questions were  answered. The patient knows to call the clinic with any problems, questions or concerns. We can certainly see the patient much sooner if necessary.  Disclaimer: This note was dictated with voice recognition software. Similar sounding words can inadvertently be transcribed and may be missed upon review.

## 2014-11-24 ENCOUNTER — Telehealth: Payer: Self-pay | Admitting: *Deleted

## 2014-11-24 ENCOUNTER — Other Ambulatory Visit: Payer: Self-pay | Admitting: *Deleted

## 2014-11-24 DIAGNOSIS — D638 Anemia in other chronic diseases classified elsewhere: Secondary | ICD-10-CM

## 2014-11-24 NOTE — Telephone Encounter (Signed)
TC from patient stating that she feels very weak and fatigued and believes she is anemic. She has a h/o chronic anemia d/t her Crohn's disease.  She would like a lab appt for CBC, Type and hold for Thursday, 11/27/14 and then appt for possible transfusion on Friday, 11/28/14

## 2014-11-25 ENCOUNTER — Telehealth: Payer: Self-pay | Admitting: Internal Medicine

## 2014-11-25 NOTE — Telephone Encounter (Signed)
s.w. pt and advised on JUNE appts

## 2014-11-27 ENCOUNTER — Other Ambulatory Visit (HOSPITAL_BASED_OUTPATIENT_CLINIC_OR_DEPARTMENT_OTHER): Payer: Medicare PPO

## 2014-11-27 ENCOUNTER — Ambulatory Visit (HOSPITAL_COMMUNITY)
Admission: RE | Admit: 2014-11-27 | Discharge: 2014-11-27 | Disposition: A | Discharge status: Home / Self Care | Payer: Medicare PPO | Source: Ambulatory Visit | Attending: Internal Medicine | Admitting: Internal Medicine

## 2014-11-27 ENCOUNTER — Other Ambulatory Visit: Payer: Self-pay | Admitting: *Deleted

## 2014-11-27 ENCOUNTER — Ambulatory Visit (HOSPITAL_COMMUNITY): Admission: RE | Admit: 2014-11-27 | Payer: Medicare PPO | Source: Ambulatory Visit

## 2014-11-27 DIAGNOSIS — D6489 Other specified anemias: Secondary | ICD-10-CM

## 2014-11-27 DIAGNOSIS — D638 Anemia in other chronic diseases classified elsewhere: Secondary | ICD-10-CM

## 2014-11-27 DIAGNOSIS — D509 Iron deficiency anemia, unspecified: Secondary | ICD-10-CM | POA: Diagnosis not present

## 2014-11-27 LAB — CBC WITH DIFFERENTIAL/PLATELET
BASO%: 0.3 % (ref 0.0–2.0)
Basophils Absolute: 0 10*3/uL (ref 0.0–0.1)
EOS%: 2 % (ref 0.0–7.0)
Eosinophils Absolute: 0.1 10*3/uL (ref 0.0–0.5)
HEMATOCRIT: 26.4 % — AB (ref 34.8–46.6)
HEMOGLOBIN: 8 g/dL — AB (ref 11.6–15.9)
LYMPH%: 23.1 % (ref 14.0–49.7)
MCH: 27.8 pg (ref 25.1–34.0)
MCHC: 30.3 g/dL — ABNORMAL LOW (ref 31.5–36.0)
MCV: 91.7 fL (ref 79.5–101.0)
MONO#: 0.3 10*3/uL (ref 0.1–0.9)
MONO%: 9.1 % (ref 0.0–14.0)
NEUT#: 2 10*3/uL (ref 1.5–6.5)
NEUT%: 65.5 % (ref 38.4–76.8)
Platelets: 135 10*3/uL — ABNORMAL LOW (ref 145–400)
RBC: 2.88 10*6/uL — ABNORMAL LOW (ref 3.70–5.45)
RDW: 15.8 % — ABNORMAL HIGH (ref 11.2–14.5)
WBC: 3.1 10*3/uL — ABNORMAL LOW (ref 3.9–10.3)
lymph#: 0.7 10*3/uL — ABNORMAL LOW (ref 0.9–3.3)

## 2014-11-27 LAB — PREPARE RBC (CROSSMATCH)

## 2014-11-27 LAB — HOLD TUBE, BLOOD BANK

## 2014-11-28 ENCOUNTER — Ambulatory Visit (HOSPITAL_BASED_OUTPATIENT_CLINIC_OR_DEPARTMENT_OTHER): Payer: Medicare PPO

## 2014-11-28 ENCOUNTER — Ambulatory Visit (HOSPITAL_COMMUNITY): Payer: Medicare PPO

## 2014-11-28 VITALS — BP 101/39 | HR 50 | Temp 97.4°F | Resp 18

## 2014-11-28 DIAGNOSIS — D638 Anemia in other chronic diseases classified elsewhere: Secondary | ICD-10-CM | POA: Diagnosis not present

## 2014-11-28 DIAGNOSIS — D6489 Other specified anemias: Secondary | ICD-10-CM | POA: Diagnosis not present

## 2014-11-28 MED ORDER — SODIUM CHLORIDE 0.9 % IV SOLN
250.0000 mL | Freq: Once | INTRAVENOUS | Status: AC
  Administered 2014-11-28: 250 mL via INTRAVENOUS

## 2014-11-28 MED ORDER — ACETAMINOPHEN 325 MG PO TABS: 650.0000 mg | ORAL_TABLET | Freq: Once | ORAL | Status: DC

## 2014-11-28 MED ORDER — DIPHENHYDRAMINE HCL 25 MG PO CAPS: 25.0000 mg | ORAL_CAPSULE | Freq: Once | ORAL | Status: DC

## 2014-11-28 NOTE — Patient Instructions (Signed)

## 2014-11-30 LAB — TYPE AND SCREEN
ABO/RH(D): AB POS
ANTIBODY SCREEN: NEGATIVE
UNIT DIVISION: 0
Unit division: 0

## 2014-12-15 ENCOUNTER — Other Ambulatory Visit (HOSPITAL_BASED_OUTPATIENT_CLINIC_OR_DEPARTMENT_OTHER): Payer: Medicare PPO

## 2014-12-15 DIAGNOSIS — D638 Anemia in other chronic diseases classified elsewhere: Secondary | ICD-10-CM

## 2014-12-15 DIAGNOSIS — D509 Iron deficiency anemia, unspecified: Secondary | ICD-10-CM | POA: Diagnosis not present

## 2014-12-15 LAB — CBC WITH DIFFERENTIAL/PLATELET
BASO%: 0.7 % (ref 0.0–2.0)
BASOS ABS: 0 10*3/uL (ref 0.0–0.1)
EOS ABS: 0.1 10*3/uL (ref 0.0–0.5)
EOS%: 1.7 % (ref 0.0–7.0)
HCT: 32.9 % — ABNORMAL LOW (ref 34.8–46.6)
HGB: 10.7 g/dL — ABNORMAL LOW (ref 11.6–15.9)
LYMPH#: 0.6 10*3/uL — AB (ref 0.9–3.3)
LYMPH%: 20.1 % (ref 14.0–49.7)
MCH: 28.5 pg (ref 25.1–34.0)
MCHC: 32.5 g/dL (ref 31.5–36.0)
MCV: 87.6 fL (ref 79.5–101.0)
MONO#: 0.3 10*3/uL (ref 0.1–0.9)
MONO%: 9.4 % (ref 0.0–14.0)
NEUT%: 68.1 % (ref 38.4–76.8)
NEUTROS ABS: 2.1 10*3/uL (ref 1.5–6.5)
Platelets: 169 10*3/uL (ref 145–400)
RBC: 3.75 10*6/uL (ref 3.70–5.45)
RDW: 15.8 % — ABNORMAL HIGH (ref 11.2–14.5)
WBC: 3 10*3/uL — ABNORMAL LOW (ref 3.9–10.3)

## 2014-12-15 LAB — FERRITIN CHCC: Ferritin: 330 ng/ml — ABNORMAL HIGH (ref 9–269)

## 2014-12-15 LAB — IRON AND TIBC CHCC
%SAT: 27 % (ref 21–57)
Iron: 54 ug/dL (ref 41–142)
TIBC: 200 ug/dL — AB (ref 236–444)
UIBC: 146 ug/dL (ref 120–384)

## 2014-12-15 LAB — HOLD TUBE, BLOOD BANK

## 2014-12-22 ENCOUNTER — Telehealth: Payer: Self-pay | Admitting: Internal Medicine

## 2014-12-22 ENCOUNTER — Ambulatory Visit (HOSPITAL_BASED_OUTPATIENT_CLINIC_OR_DEPARTMENT_OTHER): Payer: Medicare PPO | Admitting: Internal Medicine

## 2014-12-22 ENCOUNTER — Encounter: Payer: Self-pay | Admitting: Internal Medicine

## 2014-12-22 VITALS — BP 114/42 | HR 57 | Temp 97.5°F | Resp 17 | Ht <= 58 in | Wt 79.0 lb

## 2014-12-22 DIAGNOSIS — K5090 Crohn's disease, unspecified, without complications: Secondary | ICD-10-CM

## 2014-12-22 DIAGNOSIS — D638 Anemia in other chronic diseases classified elsewhere: Secondary | ICD-10-CM

## 2014-12-22 DIAGNOSIS — D509 Iron deficiency anemia, unspecified: Secondary | ICD-10-CM

## 2014-12-22 NOTE — Telephone Encounter (Signed)
Gave patient avs report and appointments for October  °

## 2014-12-22 NOTE — Progress Notes (Signed)
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   PRIOR THERAPY:  1) PRBCs transfusion as needed last was given few days ago.   CURRENT THERAPY:  1) Intravenous Feraheme infusion on as-needed basis. Last dose was given on 12/20/2011, and the patient is scheduled for another treatment tomorrow.  2) vitamin B12 injection on monthly basis.  INTERVAL HISTORY: Kim [name] 74 y.o. female returns to the clinic today for routine 3 months followup visit. 3 weeks ago the patient has been complaining of increasing fatigue and weakness. Repeat CBC at that time showed hemoglobin of 8.0. The patient received 2 units of PRBCs transfusion at that time and she is feeling much better today. She is a stable on treatment with Vedolizumab for Crohn's disease at [facility] since December 2014 and she is tolerating it well. She is currently doing the vitamin B 12 injection at home. The patient had repeat CBC, iron study and performed recently and she is here for evaluation and discussion of her lab results.   MEDICAL HISTORY: Past Medical History  Diagnosis Date  . Osteoporosis     managed by Dr. Nevada Crane at [facility], [place] q2 yrs  . OA (osteoarthritis)     hands,back  . Crohn disease     Dr. Emelda Fear at [facility]  . Glaucoma     Dr. Janyth Contes  . Intestinal malabsorption   . Anemia     related to malabsorption--gets B12 shots and iron infusions, managed by Dr. Julien Nordmann  . BCC (basal cell carcinoma of skin)     ALLERGIES:  is allergic to antihistamines, chlorpheniramine-type; metronidazole; and sulfa antibiotics.  MEDICATIONS:  Current Outpatient Prescriptions  Medication Sig Dispense Refill  . acetaminophen (TYLENOL) 500 MG tablet Take 500 mg by mouth every 6 (six) hours as  needed for pain.    . calcium citrate (CALCITRATE - DOSED IN MG ELEMENTAL CALCIUM) 950 MG tablet Take 1,200 mg of elemental calcium by mouth daily.     . cholecalciferol (VITAMIN D) 1000 UNITS tablet Take 2,000 Units by mouth daily.     . ciprofloxacin (CIPRO) 500 MG tablet Take 500 mg by mouth 2 (two) times daily.    . cyanocobalamin (,VITAMIN B-12,) 1000 MCG/ML injection Inject 1 mL (1,000 mcg total) into the muscle once. 3 mL 3  . dorzolamide-timolol (COSOPT) 22.3-6.8 MG/ML ophthalmic solution 1 drop daily.      Marland Kitchen gabapentin (NEURONTIN) 300 MG capsule Take 300 mg by mouth 3 (three) times daily.     Marland Kitchen latanoprost (XALATAN) 0.005 % ophthalmic solution Apply 1 drop to eye daily. Place into both eyes Daily.    . Melatonin 10 MG TBCR Take by mouth. Take one at bedtime    . simethicone (MYLICON) 80 MG chewable tablet Chew by mouth.    . Vedolizumab (ENTYVIO IV) Inject into the vein. Has treatments at [facility]    . [DISCONTINUED] potassium chloride 40 MEQ/15ML (20%) LIQD Take 15 mLs (40 mEq total) by mouth as directed. 150 mL 0   No current facility-administered medications for this visit.   Facility-Administered Medications Ordered in Other Visits  Medication Dose Route Frequency Provider Last Rate Last Dose  . acetaminophen (TYLENOL) tablet 650 mg  650 mg Oral Once [employer], PA-C   650 mg at 09/21/12  1522  . diphenhydrAMINE (BENADRYL) capsule 25 mg  25 mg Oral Once Carlton Adam, PA-C   25 mg at 09/21/12 1522    SURGICAL HISTORY:  Past Surgical History  Procedure Laterality Date  . Total colectomy  1984    due to Crohn's  . Internal ostomy pouch  1994  . Crohn  2009    REVIEW OF SYSTEMS:  A comprehensive review of systems was negative.   PHYSICAL EXAMINATION: General appearance: alert, cooperative, fatigued and no distress Head: Normocephalic, without obvious abnormality, atraumatic Neck: no adenopathy, no JVD, supple, symmetrical, trachea midline and thyroid not enlarged,  symmetric, no tenderness/mass/nodules Lymph nodes: Cervical, supraclavicular, and axillary nodes normal. Resp: clear to auscultation bilaterally Back: symmetric, no curvature. ROM normal. No CVA tenderness. Cardio: regular rate and rhythm, S1, S2 normal, no murmur, click, rub or gallop GI: soft, non-tender; bowel sounds normal; no masses,  no organomegaly Extremities: extremities normal, atraumatic, no cyanosis or edema and edema 1+ edema in the right lower extremity Neurologic: Alert and oriented X 3, normal strength and tone. Normal symmetric reflexes. Normal coordination and gait  ECOG PERFORMANCE STATUS: 1 - Symptomatic but completely ambulatory  Blood pressure 114/42, pulse 57, temperature 97.5 F (36.4 C), temperature source Oral, resp. rate 17, height 4' 8"  (1.422 m), weight 79 lb (35.834 kg), SpO2 100 %.  LABORATORY DATA: Lab Results  Component Value Date   WBC 3.0* 12/15/2014   HGB 10.7* 12/15/2014   HCT 32.9* 12/15/2014   MCV 87.6 12/15/2014   PLT 169 12/15/2014      Chemistry      Component Value Date/Time   NA 138 06/12/2013 1122   NA 136 09/05/2011 1558   K 3.6 06/12/2013 1122   K 3.0* 09/05/2011 1558   CL 101 09/05/2011 1558   CO2 22 06/12/2013 1122   CO2 27 09/05/2011 1558   BUN 22.5 06/12/2013 1122   BUN 34* 09/05/2011 1558   CREATININE 1.6* 06/12/2013 1122   CREATININE 1.13* 09/05/2011 1558   CREATININE 1.04 08/22/2011 1106      Component Value Date/Time   CALCIUM 8.7 06/12/2013 1122   CALCIUM 8.8 09/05/2011 1558   ALKPHOS 53 06/12/2013 1122   ALKPHOS 44 09/05/2011 1558   AST 11 06/12/2013 1122   AST 16 09/05/2011 1558   ALT 11 06/12/2013 1122   ALT 11 09/05/2011 1558   BILITOT 0.31 06/12/2013 1122   BILITOT 0.2* 09/05/2011 1558     Other lab results: Ferritin 330, serum iron 54, total iron binding capacity 200 and iron saturation 27%.   RADIOGRAPHIC STUDIES: No results found.   ASSESSMENT AND PLAN: this is a very pleasant 74 years old white  female with history of iron deficiency anemia/anemia of chronic disease secondary to malabsorption and Crohn's disease. Her CBC showed low but stable hemoglobin and hematocrit which improved after the transfusion. She also has normal iron study and ferritin. I will see her back for follow-up visit with repeat CBC and iron study in 3 months. She will continue vitamin B12 1000 mcg intramuscularly on a monthly basis outpatient basis given at home. She was advised to call immediately if she has any concerning symptoms in the interval. The patient will continue her treatment with intravenous Vedolizumab under the care of Dr. Emelda Fear at [facility].  The patient voices understanding of current disease status and treatment options and is in agreement with the current care plan. All questions were answered. The patient knows to call the clinic with  any problems, questions or concerns. We can certainly see the patient much sooner if necessary.  Disclaimer: This note was dictated with voice recognition software. Similar sounding words can inadvertently be transcribed and may be missed upon review.

## 2015-01-14 DIAGNOSIS — H4011X2 Primary open-angle glaucoma, moderate stage: Secondary | ICD-10-CM | POA: Diagnosis not present

## 2015-01-15 DIAGNOSIS — Z85828 Personal history of other malignant neoplasm of skin: Secondary | ICD-10-CM | POA: Diagnosis not present

## 2015-01-15 DIAGNOSIS — L853 Xerosis cutis: Secondary | ICD-10-CM | POA: Diagnosis not present

## 2015-01-15 DIAGNOSIS — L603 Nail dystrophy: Secondary | ICD-10-CM | POA: Diagnosis not present

## 2015-01-15 DIAGNOSIS — L723 Sebaceous cyst: Secondary | ICD-10-CM | POA: Diagnosis not present

## 2015-01-15 DIAGNOSIS — L814 Other melanin hyperpigmentation: Secondary | ICD-10-CM | POA: Diagnosis not present

## 2015-01-15 DIAGNOSIS — L821 Other seborrheic keratosis: Secondary | ICD-10-CM | POA: Diagnosis not present

## 2015-01-15 DIAGNOSIS — D1801 Hemangioma of skin and subcutaneous tissue: Secondary | ICD-10-CM | POA: Diagnosis not present

## 2015-01-15 DIAGNOSIS — D2271 Melanocytic nevi of right lower limb, including hip: Secondary | ICD-10-CM | POA: Diagnosis not present

## 2015-02-17 DIAGNOSIS — K50018 Crohn's disease of small intestine with other complication: Secondary | ICD-10-CM | POA: Diagnosis not present

## 2015-02-17 DIAGNOSIS — Z23 Encounter for immunization: Secondary | ICD-10-CM | POA: Diagnosis not present

## 2015-02-19 ENCOUNTER — Telehealth: Payer: Self-pay | Admitting: *Deleted

## 2015-02-19 ENCOUNTER — Other Ambulatory Visit: Payer: Self-pay | Admitting: Medical Oncology

## 2015-02-19 ENCOUNTER — Ambulatory Visit (HOSPITAL_COMMUNITY)
Admission: RE | Admit: 2015-02-19 | Discharge: 2015-02-19 | Disposition: A | Discharge status: Home / Self Care | Payer: Medicare PPO | Source: Ambulatory Visit | Attending: Internal Medicine | Admitting: Internal Medicine

## 2015-02-19 ENCOUNTER — Other Ambulatory Visit (HOSPITAL_BASED_OUTPATIENT_CLINIC_OR_DEPARTMENT_OTHER): Payer: Medicare PPO

## 2015-02-19 ENCOUNTER — Telehealth: Payer: Self-pay | Admitting: Internal Medicine

## 2015-02-19 DIAGNOSIS — D509 Iron deficiency anemia, unspecified: Secondary | ICD-10-CM

## 2015-02-19 DIAGNOSIS — D638 Anemia in other chronic diseases classified elsewhere: Secondary | ICD-10-CM

## 2015-02-19 DIAGNOSIS — D649 Anemia, unspecified: Secondary | ICD-10-CM

## 2015-02-19 LAB — CBC WITH DIFFERENTIAL/PLATELET
BASO%: 0.8 % (ref 0.0–2.0)
BASOS ABS: 0 10*3/uL (ref 0.0–0.1)
EOS ABS: 0.1 10*3/uL (ref 0.0–0.5)
EOS%: 3.4 % (ref 0.0–7.0)
HCT: 24 % — ABNORMAL LOW (ref 34.8–46.6)
HEMOGLOBIN: 7.3 g/dL — AB (ref 11.6–15.9)
LYMPH%: 15.9 % (ref 14.0–49.7)
MCH: 27.2 pg (ref 25.1–34.0)
MCHC: 30.6 g/dL — ABNORMAL LOW (ref 31.5–36.0)
MCV: 88.9 fL (ref 79.5–101.0)
MONO#: 0.3 10*3/uL (ref 0.1–0.9)
MONO%: 9.2 % (ref 0.0–14.0)
NEUT#: 2 10*3/uL (ref 1.5–6.5)
NEUT%: 70.7 % (ref 38.4–76.8)
Platelets: 176 10*3/uL (ref 145–400)
RBC: 2.7 10*6/uL — ABNORMAL LOW (ref 3.70–5.45)
RDW: 18.1 % — AB (ref 11.2–14.5)
WBC: 2.9 10*3/uL — ABNORMAL LOW (ref 3.9–10.3)
lymph#: 0.5 10*3/uL — ABNORMAL LOW (ref 0.9–3.3)

## 2015-02-19 LAB — PREPARE RBC (CROSSMATCH)

## 2015-02-19 LAB — HOLD TUBE, BLOOD BANK

## 2015-02-19 NOTE — Telephone Encounter (Signed)
Added PRBC's for 9/17 @ 8:30 am per 2nd 9/15 pof. Left message for patient - patient to get new schedule when she comes in for lab today.

## 2015-02-19 NOTE — Telephone Encounter (Signed)
Page "Need to talk to someone ASAP, HGB is very low".  Called Kim [name] who reports Dr. Emelda Fear messaged her with instructions to get in touch with Dr. Julien Nordmann.  HGB 02-17-2015 = 7.2.  I'm weak and I just push through it.  I need a blood transfusion Friday or Saturday because we're going out of town.  Can I come in today for lab work?  I have a doctor's appointment and can get there after 1:00 pm."  Denies chest pain or s.o.b.   Will order lab.  She will wait in lobby for provider instructions for transfusion.  She would like to receive two units the same day if possible.  This nurse advised she go to the ED if distress.

## 2015-02-19 NOTE — Telephone Encounter (Signed)
I left message for pt -she has lab appt today and the scheduler will call her with transfusion appt.

## 2015-02-21 ENCOUNTER — Ambulatory Visit (HOSPITAL_BASED_OUTPATIENT_CLINIC_OR_DEPARTMENT_OTHER): Payer: Medicare PPO

## 2015-02-21 VITALS — BP 123/46 | HR 57 | Temp 97.7°F | Resp 18

## 2015-02-21 DIAGNOSIS — D649 Anemia, unspecified: Secondary | ICD-10-CM | POA: Diagnosis not present

## 2015-02-21 MED ORDER — SODIUM CHLORIDE 0.9 % IV SOLN
250.0000 mL | Freq: Once | INTRAVENOUS | Status: AC
  Administered 2015-02-21: 250 mL via INTRAVENOUS

## 2015-02-21 NOTE — Patient Instructions (Signed)
Blood Transfusion  A blood transfusion replaces your blood or some of its parts. Blood is replaced when you have lost blood because of surgery, an accident, or for severe blood conditions like anemia. You can donate blood to be used on yourself if you have a planned surgery. If you lose blood during that surgery, your own blood can be given back to you. Any blood given to you is checked to make sure it matches your blood type. Your temperature, blood pressure, and heart rate (vital signs) will be checked often.  GET HELP RIGHT AWAY IF:   You feel sick to your stomach (nauseous) or throw up (vomit).  You have watery poop (diarrhea).  You have shortness of breath or trouble breathing.  You have blood in your pee (urine) or have dark colored pee.  You have chest pain or tightness.  Your eyes or skin turn yellow (jaundice).  You have a temperature by mouth above 102 F (38.9 C), not controlled by medicine.  You start to shake and have chills.  You develop a a red rash (hives) or feel itchy.  You develop lightheadedness or feel confused.  You develop back, joint, or muscle pain.  You do not feel hungry (lost appetite).  You feel tired, restless, or nervous.  You develop belly (abdominal) cramps. Document Released: 08/19/2008 Document Revised: 08/15/2011 Document Reviewed: 08/19/2008 [facility] Patient Information 2015 [place], [place]. This information is not intended to replace advice given to you by your health care Kim [name]. Make sure you discuss any questions you have with your health care Kim [name].

## 2015-02-23 ENCOUNTER — Telehealth: Payer: Self-pay | Admitting: Internal Medicine

## 2015-02-23 DIAGNOSIS — H4011X2 Primary open-angle glaucoma, moderate stage: Secondary | ICD-10-CM | POA: Diagnosis not present

## 2015-02-23 LAB — TYPE AND SCREEN
ABO/RH(D): AB POS
Antibody Screen: NEGATIVE
UNIT DIVISION: 0
Unit division: 0

## 2015-02-23 NOTE — Telephone Encounter (Signed)
t called to r/s lab...done....pt ok and aware

## 2015-03-18 ENCOUNTER — Other Ambulatory Visit: Payer: Medicare PPO

## 2015-03-19 ENCOUNTER — Telehealth: Payer: Self-pay | Admitting: *Deleted

## 2015-03-19 ENCOUNTER — Other Ambulatory Visit (HOSPITAL_BASED_OUTPATIENT_CLINIC_OR_DEPARTMENT_OTHER): Payer: Medicare PPO

## 2015-03-19 ENCOUNTER — Telehealth: Payer: Self-pay | Admitting: Internal Medicine

## 2015-03-19 ENCOUNTER — Other Ambulatory Visit: Payer: Self-pay | Admitting: Internal Medicine

## 2015-03-19 ENCOUNTER — Other Ambulatory Visit: Payer: Self-pay | Admitting: *Deleted

## 2015-03-19 DIAGNOSIS — D638 Anemia in other chronic diseases classified elsewhere: Secondary | ICD-10-CM

## 2015-03-19 DIAGNOSIS — D509 Iron deficiency anemia, unspecified: Secondary | ICD-10-CM | POA: Diagnosis not present

## 2015-03-19 DIAGNOSIS — D6489 Other specified anemias: Secondary | ICD-10-CM

## 2015-03-19 LAB — IRON AND TIBC CHCC
%SAT: 18 % — ABNORMAL LOW (ref 21–57)
IRON: 37 ug/dL — AB (ref 41–142)
TIBC: 201 ug/dL — AB (ref 236–444)
UIBC: 165 ug/dL (ref 120–384)

## 2015-03-19 LAB — HOLD TUBE, BLOOD BANK

## 2015-03-19 LAB — CBC WITH DIFFERENTIAL/PLATELET
BASO%: 0.7 % (ref 0.0–2.0)
BASOS ABS: 0 10*3/uL (ref 0.0–0.1)
EOS%: 3.5 % (ref 0.0–7.0)
Eosinophils Absolute: 0.1 10*3/uL (ref 0.0–0.5)
HCT: 27.3 % — ABNORMAL LOW (ref 34.8–46.6)
HGB: 8.7 g/dL — ABNORMAL LOW (ref 11.6–15.9)
LYMPH#: 0.6 10*3/uL — AB (ref 0.9–3.3)
LYMPH%: 17.6 % (ref 14.0–49.7)
MCH: 27.8 pg (ref 25.1–34.0)
MCHC: 31.8 g/dL (ref 31.5–36.0)
MCV: 87.6 fL (ref 79.5–101.0)
MONO#: 0.3 10*3/uL (ref 0.1–0.9)
MONO%: 9.4 % (ref 0.0–14.0)
NEUT#: 2.2 10*3/uL (ref 1.5–6.5)
NEUT%: 68.8 % (ref 38.4–76.8)
Platelets: 187 10*3/uL (ref 145–400)
RBC: 3.12 10*6/uL — AB (ref 3.70–5.45)
RDW: 16.9 % — ABNORMAL HIGH (ref 11.2–14.5)
WBC: 3.2 10*3/uL — ABNORMAL LOW (ref 3.9–10.3)

## 2015-03-19 LAB — FERRITIN CHCC: FERRITIN: 165 ng/mL (ref 9–269)

## 2015-03-19 NOTE — Telephone Encounter (Signed)
Further review with MD regarding pt labs. pt to get 1 unit blood and pt will get Fereheme transfusion at next office visit. Called pt with MD instructions pt advised she would like 2 units of blood as she know how she feels and how far up the hgb will go with only 1 unit. Pt also requested the Ferehme be don on 10/17 as she does not want to be here all day with blood and fereheme infusion. Discussed with pt the amount of time would only be extended by 15-30 mins. Pt again advised she does not want her whole life to revolve around " this place" Pt requested to be transferred to scheduling. No further concerns.

## 2015-03-19 NOTE — Telephone Encounter (Signed)
Pt here for labs. Hgb 8.7. Pt requesting 2 units of blood on Tuesday after MD appt. POF top scheduling for labs T& C, infusion appt.

## 2015-03-19 NOTE — Telephone Encounter (Signed)
per Mary(nurse)to add pt fera on 10/17 not 10/18 per pof. Pt req that it be on 10/17-sent MW email to sch-will call pt after reply

## 2015-03-19 NOTE — Telephone Encounter (Signed)
-----   Message from Curt Bears, MD sent at 03/19/2015  2:51 PM EDT ----- Call patient with the result and she will need feraheme infusion. Please let me know about the good days for her so I can order it.

## 2015-03-19 NOTE — Telephone Encounter (Signed)
Per 10/13 pof schedule lab prior to 10/18 f/u appointments. cxd 11:30 am lab and added lab for 10:15 am. Spoke with patient she is aware.

## 2015-03-19 NOTE — Progress Notes (Signed)
Quick Note:  Call patient with the result and she will need feraheme infusion. Please let me know about the good days for her so I can order it. ______

## 2015-03-20 ENCOUNTER — Telehealth: Payer: Self-pay | Admitting: *Deleted

## 2015-03-20 ENCOUNTER — Telehealth: Payer: Self-pay | Admitting: Medical Oncology

## 2015-03-20 ENCOUNTER — Other Ambulatory Visit: Payer: Self-pay | Admitting: Medical Oncology

## 2015-03-20 NOTE — Telephone Encounter (Signed)
-----   Message from Curt Bears, MD sent at 03/19/2015  2:51 PM EDT ----- Call patient with the result and she will need feraheme infusion. Please let me know about the good days for her so I can order it.

## 2015-03-20 NOTE — Progress Notes (Signed)
Pt requests one IV stick ( for labs and treatment) and would like RN to insert peripheral IV first and draw all her labs then she can see Julien Nordmann and then go back to chemo.

## 2015-03-20 NOTE — Telephone Encounter (Signed)
Late entry from yesterday----I have spoke with the patient regarding her appt for Monday. I have told her that we will only access her once for her treatments. Explained that we will give her the feraheme first then the blood transfusion. She verbally said that back to me and understood.

## 2015-03-23 ENCOUNTER — Telehealth: Payer: Self-pay | Admitting: Internal Medicine

## 2015-03-23 ENCOUNTER — Other Ambulatory Visit: Payer: Self-pay | Admitting: Medical Oncology

## 2015-03-23 ENCOUNTER — Inpatient Hospital Stay: Payer: Medicare PPO

## 2015-03-23 NOTE — Telephone Encounter (Signed)
Iv iron appointment made and patient will get an avs 10/18

## 2015-03-23 NOTE — Progress Notes (Signed)
Per Kim [name] pt is only getting one unit of blood  And feraheme this week and needs second dose of feraheme next week . Blood bank notified.

## 2015-03-24 ENCOUNTER — Ambulatory Visit (HOSPITAL_BASED_OUTPATIENT_CLINIC_OR_DEPARTMENT_OTHER): Payer: Medicare PPO

## 2015-03-24 ENCOUNTER — Ambulatory Visit (HOSPITAL_COMMUNITY)
Admission: RE | Admit: 2015-03-24 | Discharge: 2015-03-24 | Disposition: A | Discharge status: Home / Self Care | Payer: Medicare PPO | Source: Ambulatory Visit | Attending: Internal Medicine | Admitting: Internal Medicine

## 2015-03-24 ENCOUNTER — Ambulatory Visit (HOSPITAL_BASED_OUTPATIENT_CLINIC_OR_DEPARTMENT_OTHER): Payer: Medicare PPO | Admitting: Internal Medicine

## 2015-03-24 ENCOUNTER — Ambulatory Visit: Payer: Medicare PPO

## 2015-03-24 ENCOUNTER — Other Ambulatory Visit (HOSPITAL_BASED_OUTPATIENT_CLINIC_OR_DEPARTMENT_OTHER): Payer: Medicare PPO

## 2015-03-24 ENCOUNTER — Other Ambulatory Visit: Payer: Medicare PPO

## 2015-03-24 ENCOUNTER — Other Ambulatory Visit: Payer: Self-pay | Admitting: Medical Oncology

## 2015-03-24 ENCOUNTER — Encounter: Payer: Self-pay | Admitting: Internal Medicine

## 2015-03-24 VITALS — BP 109/47 | HR 63 | Temp 98.4°F | Resp 18

## 2015-03-24 VITALS — BP 117/43 | HR 55 | Temp 97.6°F | Resp 18 | Ht <= 58 in | Wt 76.7 lb

## 2015-03-24 DIAGNOSIS — K909 Intestinal malabsorption, unspecified: Secondary | ICD-10-CM | POA: Diagnosis not present

## 2015-03-24 DIAGNOSIS — D6489 Other specified anemias: Secondary | ICD-10-CM

## 2015-03-24 DIAGNOSIS — D649 Anemia, unspecified: Secondary | ICD-10-CM

## 2015-03-24 DIAGNOSIS — D509 Iron deficiency anemia, unspecified: Secondary | ICD-10-CM

## 2015-03-24 DIAGNOSIS — D638 Anemia in other chronic diseases classified elsewhere: Secondary | ICD-10-CM | POA: Diagnosis not present

## 2015-03-24 DIAGNOSIS — K5090 Crohn's disease, unspecified, without complications: Secondary | ICD-10-CM | POA: Diagnosis not present

## 2015-03-24 LAB — CBC WITH DIFFERENTIAL/PLATELET
BASO%: 0.9 % (ref 0.0–2.0)
Basophils Absolute: 0 10*3/uL (ref 0.0–0.1)
EOS ABS: 0.1 10*3/uL (ref 0.0–0.5)
EOS%: 3.5 % (ref 0.0–7.0)
HEMATOCRIT: 24.7 % — AB (ref 34.8–46.6)
HGB: 7.9 g/dL — ABNORMAL LOW (ref 11.6–15.9)
LYMPH#: 0.6 10*3/uL — AB (ref 0.9–3.3)
LYMPH%: 21.5 % (ref 14.0–49.7)
MCH: 28.1 pg (ref 25.1–34.0)
MCHC: 32 g/dL (ref 31.5–36.0)
MCV: 87.5 fL (ref 79.5–101.0)
MONO#: 0.4 10*3/uL (ref 0.1–0.9)
MONO%: 14.1 % — ABNORMAL HIGH (ref 0.0–14.0)
NEUT%: 60 % (ref 38.4–76.8)
NEUTROS ABS: 1.6 10*3/uL (ref 1.5–6.5)
PLATELETS: 191 10*3/uL (ref 145–400)
RBC: 2.82 10*6/uL — ABNORMAL LOW (ref 3.70–5.45)
RDW: 17.4 % — AB (ref 11.2–14.5)
WBC: 2.7 10*3/uL — AB (ref 3.9–10.3)

## 2015-03-24 LAB — HOLD TUBE, BLOOD BANK

## 2015-03-24 LAB — PREPARE RBC (CROSSMATCH)

## 2015-03-24 MED ORDER — SODIUM CHLORIDE 0.9 % IV SOLN
510.0000 mg | Freq: Once | INTRAVENOUS | Status: AC
  Administered 2015-03-24: 510 mg via INTRAVENOUS
  Filled 2015-03-24: qty 17

## 2015-03-24 MED ORDER — SODIUM CHLORIDE 0.9 % IV SOLN: 250.0000 mL | Freq: Once | INTRAVENOUS | Status: DC

## 2015-03-24 MED ORDER — SODIUM CHLORIDE 0.9 % IV SOLN
Freq: Once | INTRAVENOUS | Status: AC
  Administered 2015-03-24: 13:00:00 via INTRAVENOUS

## 2015-03-24 MED ORDER — ACETAMINOPHEN 325 MG PO TABS
ORAL_TABLET | ORAL | Status: AC
  Filled 2015-03-24: qty 2

## 2015-03-24 MED ORDER — DIPHENHYDRAMINE HCL 25 MG PO CAPS: 25.0000 mg | ORAL_CAPSULE | Freq: Once | ORAL | Status: DC

## 2015-03-24 MED ORDER — ACETAMINOPHEN 325 MG PO TABS: 650.0000 mg | ORAL_TABLET | Freq: Once | ORAL | Status: DC

## 2015-03-24 MED ORDER — DIPHENHYDRAMINE HCL 25 MG PO CAPS
ORAL_CAPSULE | ORAL | Status: AC
  Filled 2015-03-24: qty 1

## 2015-03-24 NOTE — Progress Notes (Signed)
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   PRIOR THERAPY:  1) PRBCs transfusion as needed last was given few days ago.   CURRENT THERAPY:  1) Intravenous Feraheme infusion on as-needed basis. Last dose was given on 12/20/2011, and the patient is scheduled for another treatment tomorrow.  2) vitamin B12 injection on monthly basis.  INTERVAL HISTORY: Kim [name] 74 y.o. female returns to the clinic today for routine 3 months followup visit. The patient has been complaining of increasing fatigue and weakness over the last few weeks.  She denied having any bleeding issues. She is a stable on treatment with Vedolizumab for Crohn's disease at [facility] since December 2014 and she is tolerating it well. She is currently doing the vitamin B 12 injection at home. The patient had repeat CBC, iron study and performed recently and she is here for evaluation and discussion of her lab results.   MEDICAL HISTORY: Past Medical History  Diagnosis Date  . Osteoporosis     managed by Dr. Nevada Crane at [facility], [place] q2 yrs  . OA (osteoarthritis)     hands,back  . Crohn disease ([place])     Dr. Emelda Fear at [facility]  . Glaucoma     Dr. Janyth Contes  . Intestinal malabsorption   . Anemia     related to malabsorption--gets B12 shots and iron infusions, managed by Dr. Julien Nordmann  . BCC (basal cell carcinoma of skin)     ALLERGIES:  is allergic to antihistamines, chlorpheniramine-type; metronidazole; and sulfa antibiotics.  MEDICATIONS:  Current Outpatient Prescriptions  Medication Sig Dispense Refill  . acetaminophen (TYLENOL) 500 MG tablet Take 500 mg by mouth every 6 (six) hours as needed for pain.    . calcium citrate (CALCITRATE - DOSED IN MG ELEMENTAL CALCIUM) 950 MG  tablet Take 1,200 mg of elemental calcium by mouth daily.     . cholecalciferol (VITAMIN D) 1000 UNITS tablet Take 2,000 Units by mouth daily.     . cyanocobalamin (,VITAMIN B-12,) 1000 MCG/ML injection Inject 1 mL (1,000 mcg total) into the muscle once. 3 mL 3  . dorzolamide-timolol (COSOPT) 22.3-6.8 MG/ML ophthalmic solution 1 drop daily.      Marland Kitchen gabapentin (NEURONTIN) 300 MG capsule Take 300 mg by mouth 3 (three) times daily.     Marland Kitchen latanoprost (XALATAN) 0.005 % ophthalmic solution Apply 1 drop to eye daily. Place into both eyes Daily.    . Melatonin 10 MG TBCR Take by mouth. Take one at bedtime    . simethicone (MYLICON) 80 MG chewable tablet Chew by mouth.    . Vedolizumab (ENTYVIO IV) Inject into the vein. Has treatments at [facility]    . [DISCONTINUED] potassium chloride 40 MEQ/15ML (20%) LIQD Take 15 mLs (40 mEq total) by mouth as directed. 150 mL 0   No current facility-administered medications for this visit.   Facility-Administered Medications Ordered in Other Visits  Medication Dose Route Frequency Provider Last Rate Last Dose  . acetaminophen (TYLENOL) tablet 650 mg  650 mg Oral Once [employer], PA-C   650 mg at 09/21/12 1522  . diphenhydrAMINE (BENADRYL) capsule 25 mg  25 mg Oral Once Carlton Adam, PA-C   25 mg at 09/21/12 1522    SURGICAL HISTORY:  Past Surgical History  Procedure  Laterality Date  . Total colectomy  1984    due to Crohn's  . Internal ostomy pouch  1994  . Crohn  2009    REVIEW OF SYSTEMS:  A comprehensive review of systems was negative.   PHYSICAL EXAMINATION: General appearance: alert, cooperative, fatigued and no distress Head: Normocephalic, without obvious abnormality, atraumatic Neck: no adenopathy, no JVD, supple, symmetrical, trachea midline and thyroid not enlarged, symmetric, no tenderness/mass/nodules Lymph nodes: Cervical, supraclavicular, and axillary nodes normal. Resp: clear to auscultation bilaterally Back: symmetric, no curvature.  ROM normal. No CVA tenderness. Cardio: regular rate and rhythm, S1, S2 normal, no murmur, click, rub or gallop GI: soft, non-tender; bowel sounds normal; no masses,  no organomegaly Extremities: extremities normal, atraumatic, no cyanosis or edema and edema 1+ edema in the right lower extremity Neurologic: Alert and oriented X 3, normal strength and tone. Normal symmetric reflexes. Normal coordination and gait  ECOG PERFORMANCE STATUS: 1 - Symptomatic but completely ambulatory  Blood pressure 117/43, pulse 55, temperature 97.6 F (36.4 C), temperature source Oral, resp. rate 18, height 4' 8"  (1.422 m), weight 76 lb 11.2 oz (34.791 kg), SpO2 99 %.  LABORATORY DATA: Lab Results  Component Value Date   WBC 2.7* 03/24/2015   HGB 7.9* 03/24/2015   HCT 24.7* 03/24/2015   MCV 87.5 03/24/2015   PLT 191 03/24/2015      Chemistry      Component Value Date/Time   NA 138 06/12/2013 1122   NA 136 09/05/2011 1558   K 3.6 06/12/2013 1122   K 3.0* 09/05/2011 1558   CL 101 09/05/2011 1558   CO2 22 06/12/2013 1122   CO2 27 09/05/2011 1558   BUN 22.5 06/12/2013 1122   BUN 34* 09/05/2011 1558   CREATININE 1.6* 06/12/2013 1122   CREATININE 1.13* 09/05/2011 1558   CREATININE 1.04 08/22/2011 1106      Component Value Date/Time   CALCIUM 8.7 06/12/2013 1122   CALCIUM 8.8 09/05/2011 1558   ALKPHOS 53 06/12/2013 1122   ALKPHOS 44 09/05/2011 1558   AST 11 06/12/2013 1122   AST 16 09/05/2011 1558   ALT 11 06/12/2013 1122   ALT 11 09/05/2011 1558   BILITOT 0.31 06/12/2013 1122   BILITOT 0.2* 09/05/2011 1558     Other lab results: Ferritin 165, serum iron 37, total iron binding capacity 201 and iron saturation 18%.   RADIOGRAPHIC STUDIES: No results found.   ASSESSMENT AND PLAN: this is a very pleasant 74 years old white female with history of iron deficiency anemia/anemia of chronic disease secondary to malabsorption and Crohn's disease. Her CBC today showed hemoglobin of 7.9. I will  arrange for the patient to receive 2 units of PRBCs transfusion. Iron study showed low iron level of 37 with iron saturation of 18%. I recommended for the patient to receive 2 doses of Feraheme infusion 1 today and the other one neck suite. I will see her back for follow-up visit with repeat CBC and iron study in 3 months. She will continue vitamin B12 1000 mcg intramuscularly on a monthly basis outpatient basis given at home. She was advised to call immediately if she has any concerning symptoms in the interval. The patient will continue her treatment with intravenous Vedolizumab under the care of Dr. Emelda Fear at [facility].  The patient voices understanding of current disease status and treatment options and is in agreement with the current care plan. All questions were answered. The patient knows to call the clinic with any problems,  questions or concerns. We can certainly see the patient much sooner if necessary.  Disclaimer: This note was dictated with voice recognition software. Similar sounding words can inadvertently be transcribed and may be missed upon review.

## 2015-03-24 NOTE — Patient Instructions (Signed)
Blood Transfusion, Care After  These instructions give you information about caring for yourself after your procedure. Your doctor may also give you more specific instructions. Call your doctor if you have any problems or questions after your procedure.   HOME CARE   Take medicines only as told by your doctor. Ask your doctor if you can take an over-the-counter pain reliever if you have a fever or headache a day or two after your procedure.   Return to your normal activities as told by your doctor.  GET HELP IF:    You develop redness or irritation at your IV site.   You have a fever, chills, or a headache that does not go away.   Your pee (urine) is darker than normal.   Your urine turns:    Pink.    Red.    Brown.   The white part of your eye turns yellow (jaundice).   You feel weak after doing your normal activities.  GET HELP RIGHT AWAY IF:    You have trouble breathing.   You have fever and chills and you also have:    Anxiety.    Chest or back pain.    Flushed or pink skin.    Clammy or sweaty skin.    A fast heartbeat.    A sick feeling in your stomach (nausea).     This information is not intended to replace advice given to you by your health care provider. Make sure you discuss any questions you have with your health care provider.     Document Released: 06/13/2014 Document Reviewed: 06/13/2014  Elsevier Interactive Patient Education 2016 Elsevier Inc.

## 2015-03-24 NOTE — Progress Notes (Signed)
Pt HGB lower today so Kim [name] ordered 2 units of blood.

## 2015-03-24 NOTE — Patient Instructions (Signed)
Blood Transfusion  A blood transfusion is a procedure in which you receive donated blood through an IV tube. You may need a blood transfusion because of illness, surgery, or injury. The blood may come from a donor, or it may be your own blood that you donated previously. The blood given in a transfusion is made up of different types of cells. You may receive:  Red blood cells. These carry oxygen and replace lost blood.  Platelets. These control bleeding.  Plasma. Thishelps blood to clot. If you have hemophilia or another clotting disorder, you may also receive other types of blood products. LET [facility] CARE PROVIDER KNOW ABOUT:  Any allergies you have.  All medicines you are taking, including vitamins, herbs, eye drops, creams, and over-the-counter medicines.  Previous problems you or members of your family have had with the use of anesthetics.  Any blood disorders you have.  Previous surgeries you have had.  Any medical conditions you may have.  Any previous reactions you have had during a blood transfusion.  RISKS AND COMPLICATIONS Generally, this is a safe procedure. However, problems may occur, including:  Having an allergic reaction to something in the donated blood.  Fever. This may be a reaction to the white blood cells in the transfused blood.  Iron overload. This can happen from having many transfusions.  Transfusion-related acute lung injury (TRALI). This is a rare reaction that causes lung damage. The cause is not known.TRALI can occur within hours of a transfusion or several days later.  Sudden (acute) or delayed hemolytic reactions. This happens if your blood does not match the cells in your transfusion. Your body's defense system (immune system) may try to attack the new cells. This complication is rare.  Infection. This is rare. BEFORE THE PROCEDURE  You may have a blood test to determine your blood type. This is necessary to know what kind of blood your  body will accept.  If you are going to have a planned surgery, you may donate your own blood. This may be done in case you need to have a transfusion.  If you have had an allergic reaction to a transfusion in the past, you may be given medicine to help prevent a reaction. Take this medicine only as directed by your health care provider.  You will have your temperature, blood pressure, and pulse monitored before the transfusion. PROCEDURE   An IV will be started in your hand or arm.  The bag of donated blood will be attached to your IV tube and given into your vein.  Your temperature, blood pressure, and pulse will be monitored regularly during the transfusion. This monitoring is done to detect early signs of a transfusion reaction.  If you have any signs or symptoms of a reaction, your transfusion will be stopped and you may be given medicine.  When the transfusion is over, your IV will be removed.  Pressure may be applied to the IV site for a few minutes.  A bandage (dressing) will be applied. The procedure may vary among health care providers and hospitals. AFTER THE PROCEDURE  Your blood pressure, temperature, and pulse will be monitored regularly.   This information is not intended to replace advice given to you by your health care provider. Make sure you discuss any questions you have with your health care provider.   Document Released: 05/20/2000 Document Revised: 06/13/2014 Document Reviewed: 04/02/2014 Elsevier Interactive Patient Education 2016 Elsevier Inc. Ferumoxytol injection What is this medicine? FERUMOXYTOL is  an iron complex. Iron is used to make healthy red blood cells, which carry oxygen and nutrients throughout the body. This medicine is used to treat iron deficiency anemia in people with chronic kidney disease. This medicine may be used for other purposes; ask your health care provider or pharmacist if you have questions. What should I tell my health care  provider before I take this medicine? They need to know if you have any of these conditions: -anemia not caused by low iron levels -high levels of iron in the blood -magnetic resonance imaging (MRI) test scheduled -an unusual or allergic reaction to iron, other medicines, foods, dyes, or preservatives -pregnant or trying to get pregnant -breast-feeding How should I use this medicine? This medicine is for injection into a vein. It is given by a health care professional in a hospital or clinic setting. Talk to your pediatrician regarding the use of this medicine in children. Special care may be needed. Overdosage: If you think you have taken too much of this medicine contact a poison control center or emergency room at once. NOTE: This medicine is only for you. Do not share this medicine with others. What if I miss a dose? It is important not to miss your dose. Call your doctor or health care professional if you are unable to keep an appointment. What may interact with this medicine? This medicine may interact with the following medications: -other iron products This list may not describe all possible interactions. Give your health care provider a list of all the medicines, herbs, non-prescription drugs, or dietary supplements you use. Also tell them if you smoke, drink alcohol, or use illegal drugs. Some items may interact with your medicine. What should I watch for while using this medicine? Visit your doctor or healthcare professional regularly. Tell your doctor or healthcare professional if your symptoms do not start to get better or if they get worse. You may need blood work done while you are taking this medicine. You may need to follow a special diet. Talk to your doctor. Foods that contain iron include: whole grains/cereals, dried fruits, beans, or peas, leafy green vegetables, and organ meats (liver, kidney). What side effects may I notice from receiving this medicine? Side effects that  you should report to your doctor or health care professional as soon as possible: -allergic reactions like skin rash, itching or hives, swelling of the face, lips, or tongue -breathing problems -changes in blood pressure -feeling faint or lightheaded, falls -fever or chills -flushing, sweating, or hot feelings -swelling of the ankles or feet Side effects that usually do not require medical attention (Report these to your doctor or health care professional if they continue or are bothersome.): -diarrhea -headache -nausea, vomiting -stomach pain This list may not describe all possible side effects. Call your doctor for medical advice about side effects. You may report side effects to FDA at 1-800-FDA-1088. Where should I keep my medicine? This drug is given in a hospital or clinic and will not be stored at home. NOTE: This sheet is a summary. It may not cover all possible information. If you have questions about this medicine, talk to your doctor, pharmacist, or health care provider.    2016, Elsevier/Gold Standard. (2012-01-06 15:23:36)

## 2015-03-24 NOTE — Progress Notes (Signed)
Pt arrived to infusion room stating  "I am angry and upset. I just want to feel better."  Patient refused Tylenol and Benadryl prior to blood transfusion. Patient provided with emotional support and comfort measures. Patient completed first unit of blood and is without complaints at this time.

## 2015-03-24 NOTE — Progress Notes (Signed)
1825 pt upset and almost in tears. She is upset that the appt on the 25th has not been changed to the 24th yet. She is upset "about things I won't tell you". She is upset they had to stick her 5 times to start her IV. She refused her vitals at the end of the blood transfusion "I need to be home more than I need my blood pressure taken".

## 2015-03-24 NOTE — Progress Notes (Signed)
See progress note in OV encounter - typed under OV rather than infusion encounter.

## 2015-03-25 ENCOUNTER — Telehealth: Payer: Self-pay | Admitting: Internal Medicine

## 2015-03-25 ENCOUNTER — Other Ambulatory Visit: Payer: Self-pay | Admitting: Medical Oncology

## 2015-03-25 LAB — TYPE AND SCREEN
ABO/RH(D): AB POS
ANTIBODY SCREEN: NEGATIVE
UNIT DIVISION: 0
UNIT DIVISION: 0

## 2015-03-25 NOTE — Telephone Encounter (Signed)
No voicemail on home phone,did not have11 avail on 10/24 mailed a letter and calendar

## 2015-03-26 ENCOUNTER — Telehealth: Payer: Self-pay | Admitting: Medical Oncology

## 2015-03-26 NOTE — Telephone Encounter (Signed)
Pt concerns - request feraheme on the 24th not 88AY due to conflict. Onc tx request sent. Also concerned that HGB dropped in 6 days. States she feels she needed 2 units at 8.6 because she Stated " her hgb "is always falling"  she is going to drop lower.She does state her " internal pouchitits " and going off cipro may have led to some bleeding that led to the lower HGB. . She is back on cipro . I intructed her to call if she feels like she needs her HGB checked prioo to her next appt. Note to [place].

## 2015-03-30 ENCOUNTER — Telehealth: Payer: Self-pay | Admitting: Internal Medicine

## 2015-03-30 ENCOUNTER — Ambulatory Visit (HOSPITAL_BASED_OUTPATIENT_CLINIC_OR_DEPARTMENT_OTHER): Payer: Medicare PPO

## 2015-03-30 VITALS — BP 117/48 | HR 53 | Temp 98.4°F | Resp 20

## 2015-03-30 DIAGNOSIS — D638 Anemia in other chronic diseases classified elsewhere: Secondary | ICD-10-CM

## 2015-03-30 DIAGNOSIS — D509 Iron deficiency anemia, unspecified: Secondary | ICD-10-CM

## 2015-03-30 MED ORDER — SODIUM CHLORIDE 0.9 % IV SOLN
510.0000 mg | Freq: Once | INTRAVENOUS | Status: AC
  Administered 2015-03-30: 510 mg via INTRAVENOUS
  Filled 2015-03-30: qty 17

## 2015-03-30 MED ORDER — SODIUM CHLORIDE 0.9 % IV SOLN
Freq: Once | INTRAVENOUS | Status: AC
  Administered 2015-03-30: 15:00:00 via INTRAVENOUS

## 2015-03-30 NOTE — Telephone Encounter (Signed)
per pof to sch pt appt-gave pt copy of avs °

## 2015-03-30 NOTE — Progress Notes (Signed)
Pt refused to stay the entire 30 minute post infusion monitoring time. Pt stated " I have  Had this before and I'm fine.". VSS, denies distress.

## 2015-03-30 NOTE — Patient Instructions (Signed)

## 2015-03-31 ENCOUNTER — Ambulatory Visit: Payer: Medicare PPO

## 2015-04-06 DIAGNOSIS — Z933 Colostomy status: Secondary | ICD-10-CM | POA: Diagnosis not present

## 2015-04-06 DIAGNOSIS — K50011 Crohn's disease of small intestine with rectal bleeding: Secondary | ICD-10-CM | POA: Diagnosis not present

## 2015-04-06 DIAGNOSIS — C189 Malignant neoplasm of colon, unspecified: Secondary | ICD-10-CM | POA: Diagnosis not present

## 2015-04-06 DIAGNOSIS — Z932 Ileostomy status: Secondary | ICD-10-CM | POA: Diagnosis not present

## 2015-04-13 DIAGNOSIS — H401122 Primary open-angle glaucoma, left eye, moderate stage: Secondary | ICD-10-CM | POA: Diagnosis not present

## 2015-04-21 DIAGNOSIS — K50018 Crohn's disease of small intestine with other complication: Secondary | ICD-10-CM | POA: Diagnosis not present

## 2015-04-22 ENCOUNTER — Telehealth: Payer: Self-pay | Admitting: Internal Medicine

## 2015-04-22 NOTE — Telephone Encounter (Signed)
pt called to r/s Jan 2017 appt...done....pt ok and aware of new d.t

## 2015-05-01 ENCOUNTER — Other Ambulatory Visit: Payer: Self-pay | Admitting: Internal Medicine

## 2015-06-02 ENCOUNTER — Other Ambulatory Visit: Payer: Self-pay

## 2015-06-02 DIAGNOSIS — Z1231 Encounter for screening mammogram for malignant neoplasm of breast: Secondary | ICD-10-CM

## 2015-06-09 ENCOUNTER — Telehealth: Payer: Self-pay | Admitting: Medical Oncology

## 2015-06-09 ENCOUNTER — Telehealth: Payer: Self-pay | Admitting: Internal Medicine

## 2015-06-09 ENCOUNTER — Telehealth: Payer: Self-pay | Admitting: *Deleted

## 2015-06-09 DIAGNOSIS — D509 Iron deficiency anemia, unspecified: Secondary | ICD-10-CM

## 2015-06-09 NOTE — Telephone Encounter (Signed)
Per staff message and POF I have scheduled appts. Advised scheduler of appts. JMW  

## 2015-06-09 NOTE — Telephone Encounter (Signed)
cld and left pt a message of time & date of appt for blood trans 1/6 @8 

## 2015-06-09 NOTE — Telephone Encounter (Signed)
I returned pts call . She is feeling like she needs blood transfusion. Onc tx request sent and pt notified of appts.

## 2015-06-09 NOTE — Telephone Encounter (Signed)
per pof to sch pt appt-sent Mw email to sch trmt-will call pt after reply

## 2015-06-11 ENCOUNTER — Other Ambulatory Visit: Payer: Self-pay | Admitting: Medical Oncology

## 2015-06-11 ENCOUNTER — Other Ambulatory Visit (HOSPITAL_BASED_OUTPATIENT_CLINIC_OR_DEPARTMENT_OTHER): Payer: Medicare Other

## 2015-06-11 ENCOUNTER — Other Ambulatory Visit: Payer: Self-pay | Admitting: *Deleted

## 2015-06-11 ENCOUNTER — Ambulatory Visit (HOSPITAL_COMMUNITY)
Admission: RE | Admit: 2015-06-11 | Discharge: 2015-06-11 | Disposition: A | Discharge status: Home / Self Care | Payer: Medicare Other | Source: Ambulatory Visit | Attending: Internal Medicine | Admitting: Internal Medicine

## 2015-06-11 DIAGNOSIS — D649 Anemia, unspecified: Secondary | ICD-10-CM | POA: Diagnosis not present

## 2015-06-11 DIAGNOSIS — D509 Iron deficiency anemia, unspecified: Secondary | ICD-10-CM

## 2015-06-11 DIAGNOSIS — D638 Anemia in other chronic diseases classified elsewhere: Secondary | ICD-10-CM

## 2015-06-11 LAB — CBC WITH DIFFERENTIAL/PLATELET
BASO%: 0.5 % (ref 0.0–2.0)
BASOS ABS: 0 10*3/uL (ref 0.0–0.1)
EOS ABS: 0.1 10*3/uL (ref 0.0–0.5)
EOS%: 2.5 % (ref 0.0–7.0)
HEMATOCRIT: 26.6 % — AB (ref 34.8–46.6)
HEMOGLOBIN: 8.4 g/dL — AB (ref 11.6–15.9)
LYMPH#: 0.4 10*3/uL — AB (ref 0.9–3.3)
LYMPH%: 14 % (ref 14.0–49.7)
MCH: 29.2 pg (ref 25.1–34.0)
MCHC: 31.5 g/dL (ref 31.5–36.0)
MCV: 92.8 fL (ref 79.5–101.0)
MONO#: 0.3 10*3/uL (ref 0.1–0.9)
MONO%: 11.8 % (ref 0.0–14.0)
NEUT#: 2 10*3/uL (ref 1.5–6.5)
NEUT%: 71.2 % (ref 38.4–76.8)
Platelets: 165 10*3/uL (ref 145–400)
RBC: 2.87 10*6/uL — ABNORMAL LOW (ref 3.70–5.45)
RDW: 17.2 % — AB (ref 11.2–14.5)
WBC: 2.8 10*3/uL — ABNORMAL LOW (ref 3.9–10.3)

## 2015-06-11 LAB — IRON AND TIBC
%SAT: 27 % (ref 21–57)
Iron: 52 ug/dL (ref 41–142)
TIBC: 193 ug/dL — ABNORMAL LOW (ref 236–444)
UIBC: 140 ug/dL (ref 120–384)

## 2015-06-11 LAB — HOLD TUBE, BLOOD BANK

## 2015-06-11 LAB — FERRITIN: FERRITIN: 551 ng/mL — AB (ref 9–269)

## 2015-06-11 NOTE — Progress Notes (Signed)
HAR done-pt aware of appt tomorrow

## 2015-06-12 ENCOUNTER — Ambulatory Visit (HOSPITAL_BASED_OUTPATIENT_CLINIC_OR_DEPARTMENT_OTHER): Payer: Medicare Other

## 2015-06-12 VITALS — BP 112/46 | HR 90 | Temp 98.5°F | Resp 18

## 2015-06-12 DIAGNOSIS — D649 Anemia, unspecified: Secondary | ICD-10-CM

## 2015-06-12 LAB — PREPARE RBC (CROSSMATCH)

## 2015-06-12 MED ORDER — ACETAMINOPHEN 325 MG PO TABS
ORAL_TABLET | ORAL | Status: AC
  Filled 2015-06-12: qty 2

## 2015-06-12 MED ORDER — DIPHENHYDRAMINE HCL 25 MG PO CAPS
ORAL_CAPSULE | ORAL | Status: AC
  Filled 2015-06-12: qty 1

## 2015-06-12 MED ORDER — DIPHENHYDRAMINE HCL 25 MG PO CAPS
25.0000 mg | ORAL_CAPSULE | Freq: Once | ORAL | Status: DC
Start: 2015-06-12 — End: 2015-06-12

## 2015-06-12 MED ORDER — SODIUM CHLORIDE 0.9 % IV SOLN
250.0000 mL | Freq: Once | INTRAVENOUS | Status: AC
  Administered 2015-06-12: 250 mL via INTRAVENOUS

## 2015-06-12 MED ORDER — ACETAMINOPHEN 325 MG PO TABS: 650.0000 mg | ORAL_TABLET | Freq: Once | ORAL | Status: DC

## 2015-06-12 NOTE — Patient Instructions (Signed)

## 2015-06-15 LAB — TYPE AND SCREEN
ABO/RH(D): AB POS
Antibody Screen: NEGATIVE
UNIT DIVISION: 0
UNIT DIVISION: 0

## 2015-06-18 ENCOUNTER — Other Ambulatory Visit: Payer: Medicare PPO

## 2015-06-24 ENCOUNTER — Telehealth: Payer: Self-pay | Admitting: Internal Medicine

## 2015-06-24 ENCOUNTER — Ambulatory Visit: Payer: Medicare PPO | Admitting: Internal Medicine

## 2015-06-24 ENCOUNTER — Other Ambulatory Visit: Payer: Self-pay | Admitting: Medical Oncology

## 2015-06-24 DIAGNOSIS — D509 Iron deficiency anemia, unspecified: Secondary | ICD-10-CM

## 2015-06-24 NOTE — Telephone Encounter (Signed)
Added lab to 1/19 f/u per 1/18 pof. Left message for patient.

## 2015-06-25 ENCOUNTER — Other Ambulatory Visit: Payer: Self-pay | Admitting: Medical Oncology

## 2015-06-25 ENCOUNTER — Telehealth: Payer: Self-pay | Admitting: Internal Medicine

## 2015-06-25 ENCOUNTER — Other Ambulatory Visit (HOSPITAL_BASED_OUTPATIENT_CLINIC_OR_DEPARTMENT_OTHER): Payer: Medicare Other

## 2015-06-25 ENCOUNTER — Ambulatory Visit (HOSPITAL_BASED_OUTPATIENT_CLINIC_OR_DEPARTMENT_OTHER): Payer: Medicare Other | Admitting: Internal Medicine

## 2015-06-25 ENCOUNTER — Encounter: Payer: Self-pay | Admitting: Internal Medicine

## 2015-06-25 VITALS — BP 109/45 | HR 54 | Temp 98.0°F | Resp 18 | Ht <= 58 in | Wt 74.0 lb

## 2015-06-25 DIAGNOSIS — D509 Iron deficiency anemia, unspecified: Secondary | ICD-10-CM | POA: Diagnosis not present

## 2015-06-25 DIAGNOSIS — D638 Anemia in other chronic diseases classified elsewhere: Secondary | ICD-10-CM

## 2015-06-25 DIAGNOSIS — D538 Other specified nutritional anemias: Secondary | ICD-10-CM | POA: Diagnosis not present

## 2015-06-25 DIAGNOSIS — D72819 Decreased white blood cell count, unspecified: Secondary | ICD-10-CM

## 2015-06-25 DIAGNOSIS — D696 Thrombocytopenia, unspecified: Secondary | ICD-10-CM

## 2015-06-25 DIAGNOSIS — E876 Hypokalemia: Secondary | ICD-10-CM

## 2015-06-25 DIAGNOSIS — K5090 Crohn's disease, unspecified, without complications: Secondary | ICD-10-CM

## 2015-06-25 LAB — CBC WITH DIFFERENTIAL/PLATELET
BASO%: 0.6 % (ref 0.0–2.0)
BASOS ABS: 0 10*3/uL (ref 0.0–0.1)
EOS%: 2.9 % (ref 0.0–7.0)
Eosinophils Absolute: 0.1 10*3/uL (ref 0.0–0.5)
HEMATOCRIT: 36.2 % (ref 34.8–46.6)
HEMOGLOBIN: 12 g/dL (ref 11.6–15.9)
LYMPH#: 0.7 10*3/uL — AB (ref 0.9–3.3)
LYMPH%: 21.7 % (ref 14.0–49.7)
MCH: 30.1 pg (ref 25.1–34.0)
MCHC: 33.1 g/dL (ref 31.5–36.0)
MCV: 90.7 fL (ref 79.5–101.0)
MONO#: 0.3 10*3/uL (ref 0.1–0.9)
MONO%: 9.4 % (ref 0.0–14.0)
NEUT%: 65.4 % (ref 38.4–76.8)
NEUTROS ABS: 2 10*3/uL (ref 1.5–6.5)
PLATELETS: 110 10*3/uL — AB (ref 145–400)
RBC: 3.99 10*6/uL (ref 3.70–5.45)
RDW: 15.7 % — AB (ref 11.2–14.5)
WBC: 3.1 10*3/uL — ABNORMAL LOW (ref 3.9–10.3)
nRBC: 0 % (ref 0–0)

## 2015-06-25 LAB — COMPREHENSIVE METABOLIC PANEL
ALT: 14 U/L (ref 0–55)
AST: 19 U/L (ref 5–34)
Albumin: 2.9 g/dL — ABNORMAL LOW (ref 3.5–5.0)
Alkaline Phosphatase: 35 U/L — ABNORMAL LOW (ref 40–150)
Anion Gap: 7 mEq/L (ref 3–11)
BUN: 23.2 mg/dL (ref 7.0–26.0)
CHLORIDE: 108 meq/L (ref 98–109)
CO2: 27 meq/L (ref 22–29)
Calcium: 9.2 mg/dL (ref 8.4–10.4)
Creatinine: 0.9 mg/dL (ref 0.6–1.1)
EGFR: 62 mL/min/{1.73_m2} — ABNORMAL LOW (ref >90)
GLUCOSE: 64 mg/dL — AB (ref 70–140)
POTASSIUM: 2.4 meq/L — AB (ref 3.5–5.1)
SODIUM: 142 meq/L (ref 136–145)
Total Bilirubin: <0.3 mg/dL (ref 0.20–1.20)
Total Protein: 6.4 g/dL (ref 6.4–8.3)

## 2015-06-25 MED ORDER — POTASSIUM CHLORIDE CRYS ER 20 MEQ PO TBCR: 20.0000 meq | EXTENDED_RELEASE_TABLET | Freq: Two times a day (BID) | ORAL | Status: DC

## 2015-06-25 NOTE — Progress Notes (Signed)
Pt stated that Dr Sharlet Salina is no longer her PCP -Dr. Felipa Eth is her new PCP. Her other providers include Dr Edmonia James, at [facility] for bone scan. Dr Steve Rattler , GI , and Dr Rosalie Gums -dermatology. Note sent to med records to make update in chart.

## 2015-06-25 NOTE — Telephone Encounter (Signed)
Gv pt appts for April 2017.

## 2015-06-25 NOTE — Progress Notes (Signed)
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   PRIOR THERAPY:  1) PRBCs transfusion as needed last was given few days ago.   CURRENT THERAPY:  1) Intravenous Feraheme infusion on as-needed basis. Last dose was given on last infusion was on 04/06/2015  2) vitamin B12 injection on monthly basis.  INTERVAL HISTORY: Kim [name] 75 y.o. female returns to the clinic today for routine 3 months followup visit. The patient is feeling very well today with no specific complaints. She has more energy after receiving 2 units of PRBCs transfusion less than 2 weeks ago. She also had Feraheme infusion in October 2016.  She denied having any bleeding issues. She is a stable on treatment with Vedolizumab for Crohn's disease at [facility] since December 2014 and she is tolerating it well. She is currently doing the vitamin B 12 injection at home. The patient had repeat CBC, iron study and performed recently and she is here for evaluation and discussion of her lab results.   MEDICAL HISTORY: Past Medical History  Diagnosis Date  . Osteoporosis     managed by Dr. Nevada Crane at [facility], [place] q2 yrs  . OA (osteoarthritis)     hands,back  . Crohn disease ([place])     Dr. Emelda Fear at [facility]  . Glaucoma     Dr. Janyth Contes  . Intestinal malabsorption   . Anemia     related to malabsorption--gets B12 shots and iron infusions, managed by Dr. Julien Nordmann  . BCC (basal cell carcinoma of skin)     ALLERGIES:  is allergic to antihistamines, chlorpheniramine-type; metronidazole; and sulfa antibiotics.  MEDICATIONS:  Current Outpatient Prescriptions  Medication Sig Dispense Refill  . acetaminophen (TYLENOL) 500 MG tablet Take 500 mg by mouth every 6 (six) hours as needed for pain.      . calcium citrate (CALCITRATE - DOSED IN MG ELEMENTAL CALCIUM) 950 MG tablet Take 1,200 mg of elemental calcium by mouth daily.     . cholecalciferol (VITAMIN D) 1000 UNITS tablet Take 2,000 Units by mouth daily.     . cyanocobalamin (,VITAMIN B-12,) 1000 MCG/ML injection INJECT 1 MILLILITER INTO MUSCLE EVERY 30 DAYS 3 mL 2  . dorzolamide-timolol (COSOPT) 22.3-6.8 MG/ML ophthalmic solution 1 drop daily.      Marland Kitchen gabapentin (NEURONTIN) 300 MG capsule Take 300 mg by mouth 3 (three) times daily.     Marland Kitchen latanoprost (XALATAN) 0.005 % ophthalmic solution Apply 1 drop to eye daily. Place into both eyes Daily.    . Melatonin 10 MG TBCR Take by mouth. Take one at bedtime    . simethicone (MYLICON) 80 MG chewable tablet Chew by mouth.    . Vedolizumab (ENTYVIO IV) Inject into the vein. Has treatments at [facility]    . [DISCONTINUED] potassium chloride 40 MEQ/15ML (20%) LIQD Take 15 mLs (40 mEq total) by mouth as directed. 150 mL 0   No current facility-administered medications for this visit.   Facility-Administered Medications Ordered in Other Visits  Medication Dose Route Frequency Provider Last Rate Last Dose  . acetaminophen (TYLENOL) tablet 650 mg  650 mg Oral Once Carlton Adam, PA-C   650 mg at 09/21/12 1522    SURGICAL HISTORY:  Past Surgical History  Procedure Laterality Date  . Total colectomy  1984  due to Crohn's  . Internal ostomy pouch  1994  . Crohn  2009    REVIEW OF SYSTEMS:  A comprehensive review of systems was negative.   PHYSICAL EXAMINATION: General appearance: alert, cooperative, fatigued and no distress Head: Normocephalic, without obvious abnormality, atraumatic Neck: no adenopathy, no JVD, supple, symmetrical, trachea midline and thyroid not enlarged, symmetric, no tenderness/mass/nodules Lymph nodes: Cervical, supraclavicular, and axillary nodes normal. Resp: clear to auscultation bilaterally Back: symmetric, no curvature. ROM normal. No CVA tenderness. Cardio:  regular rate and rhythm, S1, S2 normal, no murmur, click, rub or gallop GI: soft, non-tender; bowel sounds normal; no masses,  no organomegaly Extremities: extremities normal, atraumatic, no cyanosis or edema and edema 1+ edema in the right lower extremity Neurologic: Alert and oriented X 3, normal strength and tone. Normal symmetric reflexes. Normal coordination and gait  ECOG PERFORMANCE STATUS: 1 - Symptomatic but completely ambulatory  Blood pressure 109/45, pulse 54, temperature 98 F (36.7 C), temperature source Oral, resp. rate 18, height 4' 8"  (1.422 m), weight 74 lb (33.566 kg), SpO2 100 %.  LABORATORY DATA: Lab Results  Component Value Date   WBC 3.1* 06/25/2015   HGB 12.0 06/25/2015   HCT 36.2 06/25/2015   MCV 90.7 06/25/2015   PLT 110* 06/25/2015      Chemistry      Component Value Date/Time   NA 138 06/12/2013 1122   NA 136 09/05/2011 1558   K 3.6 06/12/2013 1122   K 3.0* 09/05/2011 1558   CL 101 09/05/2011 1558   CO2 22 06/12/2013 1122   CO2 27 09/05/2011 1558   BUN 22.5 06/12/2013 1122   BUN 34* 09/05/2011 1558   CREATININE 1.6* 06/12/2013 1122   CREATININE 1.13* 09/05/2011 1558   CREATININE 1.04 08/22/2011 1106      Component Value Date/Time   CALCIUM 8.7 06/12/2013 1122   CALCIUM 8.8 09/05/2011 1558   ALKPHOS 53 06/12/2013 1122   ALKPHOS 44 09/05/2011 1558   AST 11 06/12/2013 1122   AST 16 09/05/2011 1558   ALT 11 06/12/2013 1122   ALT 11 09/05/2011 1558   BILITOT 0.31 06/12/2013 1122   BILITOT 0.2* 09/05/2011 1558     Other lab results: Ferritin 551, serum iron 52, total iron binding capacity 193 and iron saturation 27%.   RADIOGRAPHIC STUDIES: No results found.   ASSESSMENT AND PLAN: this is a very pleasant 75 years old white female with history of iron deficiency anemia/anemia of chronic disease secondary to malabsorption and Crohn's disease. Repeat CBC today showed significant improvement of her hemoglobin up to 12.0 and hematocrit 36.2%  after receiving 2 units of PRBCs transfusion 2 weeks ago. She has mild leukocytopenia and thrombocytopenia. Her iron study is unremarkable. I recommended for the patient to continue on observation in addition to the monthly B-12 injection. I will see her back for follow-up visit with repeat CBC and iron study in 3 months. She will continue vitamin B12 1000 mcg intramuscularly on a monthly basis outpatient basis given at home. She was advised to call immediately if she has any concerning symptoms in the interval. The patient will continue her treatment with intravenous Vedolizumab under the care of Dr. Emelda Fear at [facility].  The patient voices understanding of current disease status and treatment options and is in agreement with the current care plan. All questions were answered. The patient knows to call the clinic with any problems, questions or concerns. We can certainly see the patient much sooner if necessary.  Disclaimer: This note was dictated with voice recognition software. Similar sounding words can inadvertently be transcribed and may be missed upon review.

## 2015-06-26 ENCOUNTER — Telehealth: Payer: Self-pay | Admitting: Internal Medicine

## 2015-06-26 NOTE — Telephone Encounter (Signed)
Per pt request removed pcp Dr Pricilla Holm  And new pcp is Dr. Lajean Manes per RN. Changed in epic

## 2015-07-14 ENCOUNTER — Ambulatory Visit
Admission: RE | Admit: 2015-07-14 | Discharge: 2015-07-14 | Disposition: A | Discharge status: Home / Self Care | Payer: Medicare Other | Source: Ambulatory Visit

## 2015-07-14 DIAGNOSIS — Z1231 Encounter for screening mammogram for malignant neoplasm of breast: Secondary | ICD-10-CM

## 2015-09-17 ENCOUNTER — Other Ambulatory Visit: Payer: Self-pay | Admitting: Medical Oncology

## 2015-09-17 ENCOUNTER — Telehealth: Payer: Self-pay | Admitting: Medical Oncology

## 2015-09-17 ENCOUNTER — Telehealth: Payer: Self-pay | Admitting: Internal Medicine

## 2015-09-17 ENCOUNTER — Other Ambulatory Visit (HOSPITAL_BASED_OUTPATIENT_CLINIC_OR_DEPARTMENT_OTHER): Payer: Medicare Other

## 2015-09-17 ENCOUNTER — Telehealth: Payer: Self-pay | Admitting: *Deleted

## 2015-09-17 ENCOUNTER — Ambulatory Visit (HOSPITAL_COMMUNITY)
Admission: RE | Admit: 2015-09-17 | Discharge: 2015-09-17 | Disposition: A | Discharge status: Home / Self Care | Payer: Medicare Other | Source: Ambulatory Visit | Attending: Internal Medicine | Admitting: Internal Medicine

## 2015-09-17 DIAGNOSIS — D638 Anemia in other chronic diseases classified elsewhere: Secondary | ICD-10-CM

## 2015-09-17 DIAGNOSIS — D649 Anemia, unspecified: Secondary | ICD-10-CM | POA: Diagnosis not present

## 2015-09-17 DIAGNOSIS — D509 Iron deficiency anemia, unspecified: Secondary | ICD-10-CM

## 2015-09-17 LAB — CBC WITH DIFFERENTIAL/PLATELET
BASO%: 0.5 % (ref 0.0–2.0)
Basophils Absolute: 0 10*3/uL (ref 0.0–0.1)
EOS ABS: 0.1 10*3/uL (ref 0.0–0.5)
EOS%: 2.4 % (ref 0.0–7.0)
HCT: 27 % — ABNORMAL LOW (ref 34.8–46.6)
HGB: 8.6 g/dL — ABNORMAL LOW (ref 11.6–15.9)
LYMPH%: 11.2 % — AB (ref 14.0–49.7)
MCH: 28.5 pg (ref 25.1–34.0)
MCHC: 31.8 g/dL (ref 31.5–36.0)
MCV: 89.8 fL (ref 79.5–101.0)
MONO#: 0.5 10*3/uL (ref 0.1–0.9)
MONO%: 11.4 % (ref 0.0–14.0)
NEUT%: 74.5 % (ref 38.4–76.8)
NEUTROS ABS: 3.2 10*3/uL (ref 1.5–6.5)
Platelets: 178 10*3/uL (ref 145–400)
RBC: 3.01 10*6/uL — AB (ref 3.70–5.45)
RDW: 16.3 % — ABNORMAL HIGH (ref 11.2–14.5)
WBC: 4.3 10*3/uL (ref 3.9–10.3)
lymph#: 0.5 10*3/uL — ABNORMAL LOW (ref 0.9–3.3)

## 2015-09-17 LAB — IRON AND TIBC
%SAT: 21 % (ref 21–57)
Iron: 44 ug/dL (ref 41–142)
TIBC: 210 ug/dL — ABNORMAL LOW (ref 236–444)
UIBC: 166 ug/dL (ref 120–384)

## 2015-09-17 LAB — FERRITIN: FERRITIN: 428 ng/mL — AB (ref 9–269)

## 2015-09-17 NOTE — Telephone Encounter (Signed)
Pt notified she will get feraheme tomorrow.

## 2015-09-17 NOTE — Telephone Encounter (Signed)
Per staff message and POF I have scheduled appts. Advised scheduler of appts. JMW  

## 2015-09-17 NOTE — Telephone Encounter (Signed)
per pof to sch pt appt-sent MW emailt o sch trmt-pt will get updated sch on 4/14

## 2015-09-18 ENCOUNTER — Other Ambulatory Visit: Payer: Self-pay | Admitting: Internal Medicine

## 2015-09-18 ENCOUNTER — Ambulatory Visit (HOSPITAL_BASED_OUTPATIENT_CLINIC_OR_DEPARTMENT_OTHER): Payer: Medicare Other

## 2015-09-18 VITALS — BP 91/34 | HR 54 | Temp 96.9°F | Resp 18

## 2015-09-18 DIAGNOSIS — D649 Anemia, unspecified: Secondary | ICD-10-CM

## 2015-09-18 DIAGNOSIS — D509 Iron deficiency anemia, unspecified: Secondary | ICD-10-CM

## 2015-09-18 DIAGNOSIS — D638 Anemia in other chronic diseases classified elsewhere: Secondary | ICD-10-CM

## 2015-09-18 LAB — PREPARE RBC (CROSSMATCH)

## 2015-09-18 MED ORDER — DIPHENHYDRAMINE HCL 25 MG PO CAPS: 25.0000 mg | ORAL_CAPSULE | Freq: Once | ORAL | Status: DC

## 2015-09-18 MED ORDER — SODIUM CHLORIDE 0.9 % IV SOLN
Freq: Once | INTRAVENOUS | Status: AC
  Administered 2015-09-18: 16:00:00 via INTRAVENOUS

## 2015-09-18 MED ORDER — SODIUM CHLORIDE 0.9 % IV SOLN
510.0000 mg | Freq: Once | INTRAVENOUS | Status: AC
  Administered 2015-09-18: 510 mg via INTRAVENOUS
  Filled 2015-09-18: qty 17

## 2015-09-18 MED ORDER — ACETAMINOPHEN 325 MG PO TABS: 650.0000 mg | ORAL_TABLET | Freq: Once | ORAL | Status: DC

## 2015-09-18 MED ORDER — SODIUM CHLORIDE 0.9 % IV SOLN
250.0000 mL | Freq: Once | INTRAVENOUS | Status: AC
  Administered 2015-09-18: 250 mL via INTRAVENOUS

## 2015-09-18 NOTE — Patient Instructions (Signed)
Blood Transfusion  A blood transfusion is a procedure in which you receive donated blood through an IV tube. You may need a blood transfusion because of illness, surgery, or injury. The blood may come from a donor, or it may be your own blood that you donated previously. The blood given in a transfusion is made up of different types of cells. You may receive:  Red blood cells. These carry oxygen and replace lost blood.  Platelets. These control bleeding.  Plasma. Thishelps blood to clot. If you have hemophilia or another clotting disorder, you may also receive other types of blood products. LET [facility] CARE PROVIDER KNOW ABOUT:  Any allergies you have.  All medicines you are taking, including vitamins, herbs, eye drops, creams, and over-the-counter medicines.  Previous problems you or members of your family have had with the use of anesthetics.  Any blood disorders you have.  Previous surgeries you have had.  Any medical conditions you may have.  Any previous reactions you have had during a blood transfusion.  RISKS AND COMPLICATIONS Generally, this is a safe procedure. However, problems may occur, including:  Having an allergic reaction to something in the donated blood.  Fever. This may be a reaction to the white blood cells in the transfused blood.  Iron overload. This can happen from having many transfusions.  Transfusion-related acute lung injury (TRALI). This is a rare reaction that causes lung damage. The cause is not known.TRALI can occur within hours of a transfusion or several days later.  Sudden (acute) or delayed hemolytic reactions. This happens if your blood does not match the cells in your transfusion. Your body's defense system (immune system) may try to attack the new cells. This complication is rare.  Infection. This is rare. BEFORE THE PROCEDURE  You may have a blood test to determine your blood type. This is necessary to know what kind of blood your  body will accept.  If you are going to have a planned surgery, you may donate your own blood. This may be done in case you need to have a transfusion.  If you have had an allergic reaction to a transfusion in the past, you may be given medicine to help prevent a reaction. Take this medicine only as directed by your health care provider.  You will have your temperature, blood pressure, and pulse monitored before the transfusion. PROCEDURE   An IV will be started in your hand or arm.  The bag of donated blood will be attached to your IV tube and given into your vein.  Your temperature, blood pressure, and pulse will be monitored regularly during the transfusion. This monitoring is done to detect early signs of a transfusion reaction.  If you have any signs or symptoms of a reaction, your transfusion will be stopped and you may be given medicine.  When the transfusion is over, your IV will be removed.  Pressure may be applied to the IV site for a few minutes.  A bandage (dressing) will be applied. The procedure may vary among health care providers and hospitals. AFTER THE PROCEDURE  Your blood pressure, temperature, and pulse will be monitored regularly.   This information is not intended to replace advice given to you by your health care provider. Make sure you discuss any questions you have with your health care provider.   Document Released: 05/20/2000 Document Revised: 06/13/2014 Document Reviewed: 04/02/2014 Elsevier Interactive Patient Education 2016 Elsevier Inc.  Ferumoxytol injection What is this medicine? FERUMOXYTOL  is an iron complex. Iron is used to make healthy red blood cells, which carry oxygen and nutrients throughout the body. This medicine is used to treat iron deficiency anemia in people with chronic kidney disease. This medicine may be used for other purposes; ask your health care provider or pharmacist if you have questions. What should I tell my health care  provider before I take this medicine? They need to know if you have any of these conditions: -anemia not caused by low iron levels -high levels of iron in the blood -magnetic resonance imaging (MRI) test scheduled -an unusual or allergic reaction to iron, other medicines, foods, dyes, or preservatives -pregnant or trying to get pregnant -breast-feeding How should I use this medicine? This medicine is for injection into a vein. It is given by a health care professional in a hospital or clinic setting. Talk to your pediatrician regarding the use of this medicine in children. Special care may be needed. Overdosage: If you think you have taken too much of this medicine contact a poison control center or emergency room at once. NOTE: This medicine is only for you. Do not share this medicine with others. What if I miss a dose? It is important not to miss your dose. Call your doctor or health care professional if you are unable to keep an appointment. What may interact with this medicine? This medicine may interact with the following medications: -other iron products This list may not describe all possible interactions. Give your health care provider a list of all the medicines, herbs, non-prescription drugs, or dietary supplements you use. Also tell them if you smoke, drink alcohol, or use illegal drugs. Some items may interact with your medicine. What should I watch for while using this medicine? Visit your doctor or healthcare professional regularly. Tell your doctor or healthcare professional if your symptoms do not start to get better or if they get worse. You may need blood work done while you are taking this medicine. You may need to follow a special diet. Talk to your doctor. Foods that contain iron include: whole grains/cereals, dried fruits, beans, or peas, leafy green vegetables, and organ meats (liver, kidney). What side effects may I notice from receiving this medicine? Side effects that  you should report to your doctor or health care professional as soon as possible: -allergic reactions like skin rash, itching or hives, swelling of the face, lips, or tongue -breathing problems -changes in blood pressure -feeling faint or lightheaded, falls -fever or chills -flushing, sweating, or hot feelings -swelling of the ankles or feet Side effects that usually do not require medical attention (Report these to your doctor or health care professional if they continue or are bothersome.): -diarrhea -headache -nausea, vomiting -stomach pain This list may not describe all possible side effects. Call your doctor for medical advice about side effects. You may report side effects to FDA at 1-800-FDA-1088. Where should I keep my medicine? This drug is given in a hospital or clinic and will not be stored at home. NOTE: This sheet is a summary. It may not cover all possible information. If you have questions about this medicine, talk to your doctor, pharmacist, or health care provider.    2016, Elsevier/Gold Standard. (2012-01-06 15:23:36)

## 2015-09-18 NOTE — Progress Notes (Signed)
Pt refused to stay for 30 minutes observation after feraheme.

## 2015-09-21 LAB — TYPE AND SCREEN
ABO/RH(D): AB POS
ANTIBODY SCREEN: NEGATIVE
UNIT DIVISION: 0
UNIT DIVISION: 0

## 2015-09-22 ENCOUNTER — Other Ambulatory Visit: Payer: Self-pay | Admitting: *Deleted

## 2015-09-22 DIAGNOSIS — D509 Iron deficiency anemia, unspecified: Secondary | ICD-10-CM

## 2015-09-23 ENCOUNTER — Telehealth: Payer: Self-pay | Admitting: Internal Medicine

## 2015-09-23 ENCOUNTER — Encounter: Payer: Self-pay | Admitting: Internal Medicine

## 2015-09-23 ENCOUNTER — Other Ambulatory Visit: Payer: Self-pay | Admitting: Medical Oncology

## 2015-09-23 ENCOUNTER — Telehealth: Payer: Self-pay | Admitting: *Deleted

## 2015-09-23 DIAGNOSIS — D509 Iron deficiency anemia, unspecified: Secondary | ICD-10-CM

## 2015-09-23 NOTE — Telephone Encounter (Signed)
lvm for pt regarding to 4.20 lab added b4 md visit

## 2015-09-23 NOTE — Telephone Encounter (Signed)
Per pof appts cx...done.Marland KitchenMarland KitchenMarland Kitchen

## 2015-09-23 NOTE — Telephone Encounter (Signed)
Pt called with concerns about upcoming appts; "why I needs all these appts" and "why I was not notified of these appts" Informed pt I would look into this and be glad to call her back with additional information. Pt states " I need to know now, not later"  Again asked pt if I could call her back with this information s it may take a while get all the information.  Pt states " I have to go to the gym and will not be home later. I need to know now." Placed pt on hold;looked into her concern.  Discussed with pt she has a lab appt prior to MD visit, this is likely to check her hgb to evaluate if the recent (4/14) blood transfusion helped, the MD visit is her routine 3 month follow up visit and the 4/21 visit is for her 2nd dose of fereheme.  Pt became upset informing me that I did not have any medical knowlegde, nor was I being helpful to answer her concerns.  Pt asked "what is the exact number of my fereheme level and the range." Told pt this information.  Pt again became very frustrated, questioned why she needed additional fereheme, why she was not advised of this before. Attempt to explain to pt fereheme is 2 separate treatments. Pt advised she did not want labs tomorrow and could not come on 4/21 for fereheme as she had already made plans. Attempt to tell pt I will cancel these appts,pt again interrupted and readdressed her question " why do I need more fereheme and why was I not advised of all these appts.   Placed pt on hold , reviewed with MD.  Informed pt;  per MD, he will be glad to answer all her concerns at her visit tomorrow.   Advised pt I will give him the concerns she has expressed today and he will discuss with her at her appt. I was not able to be of further assistance at this time. Pt verbalized understanding.   No further concerns.

## 2015-09-24 ENCOUNTER — Ambulatory Visit (HOSPITAL_BASED_OUTPATIENT_CLINIC_OR_DEPARTMENT_OTHER): Payer: Medicare Other | Admitting: Internal Medicine

## 2015-09-24 ENCOUNTER — Other Ambulatory Visit: Payer: Medicare Other

## 2015-09-24 ENCOUNTER — Encounter: Payer: Self-pay | Admitting: Internal Medicine

## 2015-09-24 ENCOUNTER — Ambulatory Visit: Payer: Medicare Other | Admitting: Internal Medicine

## 2015-09-24 VITALS — BP 113/44 | HR 49 | Temp 98.0°F | Resp 18 | Ht <= 58 in | Wt 74.9 lb

## 2015-09-24 DIAGNOSIS — D509 Iron deficiency anemia, unspecified: Secondary | ICD-10-CM | POA: Diagnosis not present

## 2015-09-24 DIAGNOSIS — K5090 Crohn's disease, unspecified, without complications: Secondary | ICD-10-CM

## 2015-09-24 DIAGNOSIS — K5909 Other constipation: Secondary | ICD-10-CM | POA: Diagnosis not present

## 2015-09-24 DIAGNOSIS — D638 Anemia in other chronic diseases classified elsewhere: Secondary | ICD-10-CM

## 2015-09-24 NOTE — Progress Notes (Signed)
[place] Telephone:(336) [phone]   Fax:(336) [phone]  OFFICE PROGRESS NOTE  Mathews Argyle, MD 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   PRIOR THERAPY:  1) PRBCs transfusion as needed last was given few days ago.   CURRENT THERAPY:  1) Intravenous Feraheme infusion on as-needed basis. Last dose was given on last infusion was on 09/18/2015 2) vitamin B12 injection on monthly basis.  INTERVAL HISTORY: Kim [name] 75 y.o. female returns to the clinic today for routine 3 months followup visit. The patient is feeling very well today with no specific complaints. She was found on recent blood work to have hemoglobin down to 8.6 G/DL. She will receive 2 units of PRBCs transfusion 2 days ago and she is feeling much better. She also received 1 dose of Feraheme infusion on 09/18/2015. She denied having any bleeding issues. She is a stable on treatment with Vedolizumab for Crohn's disease at [facility] since December 2014 and she is tolerating it well. She is currently doing the vitamin B 12 injection at home. The patient had repeat CBC, iron study and performed recently and she is here for evaluation and discussion of her lab results. She was supposed to have repeat CBC today but she declined.  MEDICAL HISTORY: Past Medical History  Diagnosis Date  . Osteoporosis     managed by Dr. Nevada Crane at [facility], [place] q2 yrs  . OA (osteoarthritis)     hands,back  . Crohn disease ([place])     Dr. Emelda Fear at [facility]  . Glaucoma     Dr. Janyth Contes  . Intestinal malabsorption   . Anemia     related to malabsorption--gets B12 shots and iron infusions, managed by Dr. Julien Nordmann  . BCC (basal cell carcinoma of skin)     ALLERGIES:  is allergic to antihistamines, chlorpheniramine-type; metronidazole; and sulfa antibiotics.  MEDICATIONS:  Current Outpatient  Prescriptions  Medication Sig Dispense Refill  . acetaminophen (TYLENOL) 500 MG tablet Take 500 mg by mouth every 6 (six) hours as needed for pain.    . calcium citrate (CALCITRATE - DOSED IN MG ELEMENTAL CALCIUM) 950 MG tablet Take 1,200 mg of elemental calcium by mouth daily.     . cholecalciferol (VITAMIN D) 1000 UNITS tablet Take 2,000 Units by mouth daily.     . cyanocobalamin (,VITAMIN B-12,) 1000 MCG/ML injection INJECT 1 MILLILITER INTO MUSCLE EVERY 30 DAYS 3 mL 2  . dorzolamide-timolol (COSOPT) 22.3-6.8 MG/ML ophthalmic solution 1 drop daily.      Marland Kitchen gabapentin (NEURONTIN) 300 MG capsule Take 300 mg by mouth 3 (three) times daily.     Marland Kitchen latanoprost (XALATAN) 0.005 % ophthalmic solution Apply 1 drop to eye daily. Place into both eyes Daily.    . Melatonin 10 MG TBCR Take by mouth. Take one at bedtime    . simethicone (MYLICON) 80 MG chewable tablet Chew by mouth.    . Vedolizumab (ENTYVIO IV) Inject into the vein. Has treatments at [facility]    . ciprofloxacin (CIPRO) 500 MG tablet Take 500 mg by mouth as directed.    . [DISCONTINUED] potassium chloride 40 MEQ/15ML (20%) LIQD Take 15 mLs (40 mEq total) by mouth as directed. 150 mL 0   No current facility-administered medications for this visit.   Facility-Administered Medications Ordered in Other Visits  Medication Dose Route Frequency Provider Last Rate Last Dose  .  acetaminophen (TYLENOL) tablet 650 mg  650 mg Oral Once Carlton Adam, PA-C   650 mg at 09/21/12 1522    SURGICAL HISTORY:  Past Surgical History  Procedure Laterality Date  . Total colectomy  1984    due to Crohn's  . Internal ostomy pouch  1994  . Crohn  2009    REVIEW OF SYSTEMS:  Constitutional: positive for fatigue Eyes: negative Ears, nose, mouth, throat, and face: negative Respiratory: negative Cardiovascular: negative Gastrointestinal: negative Genitourinary:negative Integument/breast: negative Hematologic/lymphatic:  negative Musculoskeletal:negative Neurological: negative Behavioral/Psych: negative Endocrine: negative Allergic/Immunologic: negative   PHYSICAL EXAMINATION: General appearance: alert, cooperative, fatigued and no distress Head: Normocephalic, without obvious abnormality, atraumatic Neck: no adenopathy, no JVD, supple, symmetrical, trachea midline and thyroid not enlarged, symmetric, no tenderness/mass/nodules Lymph nodes: Cervical, supraclavicular, and axillary nodes normal. Resp: clear to auscultation bilaterally Back: symmetric, no curvature. ROM normal. No CVA tenderness. Cardio: regular rate and rhythm, S1, S2 normal, no murmur, click, rub or gallop GI: soft, non-tender; bowel sounds normal; no masses,  no organomegaly Extremities: extremities normal, atraumatic, no cyanosis or edema and edema 1+ edema in the right lower extremity Neurologic: Alert and oriented X 3, normal strength and tone. Normal symmetric reflexes. Normal coordination and gait  ECOG PERFORMANCE STATUS: 1 - Symptomatic but completely ambulatory  Blood pressure 113/44, pulse 49, temperature 98 F (36.7 C), temperature source Oral, resp. rate 18, height 4' 8"  (1.422 m), weight 74 lb 14.4 oz (33.974 kg), SpO2 100 %.  LABORATORY DATA: Lab Results  Component Value Date   WBC 4.3 09/17/2015   HGB 8.6* 09/17/2015   HCT 27.0* 09/17/2015   MCV 89.8 09/17/2015   PLT 178 09/17/2015      Chemistry      Component Value Date/Time   NA 142 06/25/2015 1004   NA 136 09/05/2011 1558   K 2.4* 06/25/2015 1004   K 3.0* 09/05/2011 1558   CL 101 09/05/2011 1558   CO2 27 06/25/2015 1004   CO2 27 09/05/2011 1558   BUN 23.2 06/25/2015 1004   BUN 34* 09/05/2011 1558   CREATININE 0.9 06/25/2015 1004   CREATININE 1.13* 09/05/2011 1558   CREATININE 1.04 08/22/2011 1106      Component Value Date/Time   CALCIUM 9.2 06/25/2015 1004   CALCIUM 8.8 09/05/2011 1558   ALKPHOS 35* 06/25/2015 1004   ALKPHOS 44 09/05/2011 1558    AST 19 06/25/2015 1004   AST 16 09/05/2011 1558   ALT 14 06/25/2015 1004   ALT 11 09/05/2011 1558   BILITOT <0.30 06/25/2015 1004   BILITOT 0.2* 09/05/2011 1558     Other lab results: Ferritin 428, serum iron 44, total iron binding capacity 210 and iron saturation 21%.   RADIOGRAPHIC STUDIES: No results found.   ASSESSMENT AND PLAN: this is a very pleasant 75 years old white female with history of iron deficiency anemia/anemia of chronic disease secondary to malabsorption and Crohn's disease. Repeat CBC 7 days ago showed hemoglobin was down to 8.6. The patient was treated with 2 units of PRBCs transfusion. I also recommended for her to proceed with Feraheme infusion and she received 1 dose on 09/18/2015. She is expected to receive the second dose next week. I recommended for the patient to continue on observation in addition to the monthly B-12 injection at home. I will see her back for follow-up visit with repeat CBC and iron study in 3 months. She was advised to call immediately if she has any concerning symptoms in the  interval. The patient will continue her treatment with intravenous Vedolizumab under the care of Dr. Emelda Fear at [facility].  The patient voices understanding of current disease status and treatment options and is in agreement with the current care plan. All questions were answered. The patient knows to call the clinic with any problems, questions or concerns. We can certainly see the patient much sooner if necessary.  Disclaimer: This note was dictated with voice recognition software. Similar sounding words can inadvertently be transcribed and may be missed upon review.

## 2015-09-25 ENCOUNTER — Ambulatory Visit: Payer: Medicare Other

## 2015-09-28 ENCOUNTER — Ambulatory Visit (HOSPITAL_BASED_OUTPATIENT_CLINIC_OR_DEPARTMENT_OTHER): Payer: Medicare Other

## 2015-09-28 VITALS — BP 103/32 | HR 47 | Temp 97.3°F | Resp 16

## 2015-09-28 DIAGNOSIS — D638 Anemia in other chronic diseases classified elsewhere: Secondary | ICD-10-CM

## 2015-09-28 DIAGNOSIS — D509 Iron deficiency anemia, unspecified: Secondary | ICD-10-CM | POA: Diagnosis not present

## 2015-09-28 MED ORDER — SODIUM CHLORIDE 0.9 % IV SOLN
510.0000 mg | Freq: Once | INTRAVENOUS | Status: AC
  Administered 2015-09-28: 510 mg via INTRAVENOUS
  Filled 2015-09-28: qty 17

## 2015-09-28 MED ORDER — SODIUM CHLORIDE 0.9 % IV SOLN
Freq: Once | INTRAVENOUS | Status: AC
  Administered 2015-09-28: 12:00:00 via INTRAVENOUS

## 2015-09-28 NOTE — Patient Instructions (Signed)

## 2015-11-17 ENCOUNTER — Telehealth: Payer: Self-pay | Admitting: Medical Oncology

## 2015-11-17 ENCOUNTER — Other Ambulatory Visit: Payer: Self-pay | Admitting: Medical Oncology

## 2015-11-17 ENCOUNTER — Ambulatory Visit (HOSPITAL_BASED_OUTPATIENT_CLINIC_OR_DEPARTMENT_OTHER): Payer: Medicare Other

## 2015-11-17 ENCOUNTER — Telehealth: Payer: Self-pay | Admitting: Internal Medicine

## 2015-11-17 ENCOUNTER — Ambulatory Visit (HOSPITAL_COMMUNITY)
Admission: RE | Admit: 2015-11-17 | Discharge: 2015-11-17 | Disposition: A | Discharge status: Home / Self Care | Payer: Medicare Other | Source: Ambulatory Visit | Attending: Internal Medicine | Admitting: Internal Medicine

## 2015-11-17 DIAGNOSIS — D649 Anemia, unspecified: Secondary | ICD-10-CM | POA: Diagnosis present

## 2015-11-17 DIAGNOSIS — D509 Iron deficiency anemia, unspecified: Secondary | ICD-10-CM | POA: Diagnosis not present

## 2015-11-17 LAB — CBC WITH DIFFERENTIAL/PLATELET
BASO%: 0.3 % (ref 0.0–2.0)
Basophils Absolute: 0 10*3/uL (ref 0.0–0.1)
EOS%: 2.3 % (ref 0.0–7.0)
Eosinophils Absolute: 0.1 10*3/uL (ref 0.0–0.5)
HCT: 27.6 % — ABNORMAL LOW (ref 34.8–46.6)
HEMOGLOBIN: 9.1 g/dL — AB (ref 11.6–15.9)
LYMPH%: 11.9 % — AB (ref 14.0–49.7)
MCH: 29.4 pg (ref 25.1–34.0)
MCHC: 33 g/dL (ref 31.5–36.0)
MCV: 88.9 fL (ref 79.5–101.0)
MONO#: 0.5 10*3/uL (ref 0.1–0.9)
MONO%: 10.5 % (ref 0.0–14.0)
NEUT%: 75 % (ref 38.4–76.8)
NEUTROS ABS: 3.4 10*3/uL (ref 1.5–6.5)
PLATELETS: 155 10*3/uL (ref 145–400)
RBC: 3.1 10*6/uL — AB (ref 3.70–5.45)
RDW: 17.9 % — AB (ref 11.2–14.5)
WBC: 4.5 10*3/uL (ref 3.9–10.3)
lymph#: 0.5 10*3/uL — ABNORMAL LOW (ref 0.9–3.3)

## 2015-11-17 NOTE — Telephone Encounter (Signed)
Orders and appt done.

## 2015-11-17 NOTE — Progress Notes (Signed)
HAR obtained

## 2015-11-17 NOTE — Telephone Encounter (Signed)
Gave pt apt

## 2015-11-18 ENCOUNTER — Ambulatory Visit: Payer: Medicare Other

## 2015-11-19 ENCOUNTER — Ambulatory Visit (HOSPITAL_BASED_OUTPATIENT_CLINIC_OR_DEPARTMENT_OTHER): Payer: Medicare Other

## 2015-11-19 ENCOUNTER — Other Ambulatory Visit: Payer: Self-pay | Admitting: Medical Oncology

## 2015-11-19 VITALS — BP 112/53 | HR 54 | Temp 98.6°F | Resp 18

## 2015-11-19 DIAGNOSIS — D649 Anemia, unspecified: Secondary | ICD-10-CM

## 2015-11-19 LAB — PREPARE RBC (CROSSMATCH)

## 2015-11-19 MED ORDER — ACETAMINOPHEN 325 MG PO TABS: 650.0000 mg | ORAL_TABLET | Freq: Once | ORAL | Status: DC

## 2015-11-19 MED ORDER — SODIUM CHLORIDE 0.9 % IV SOLN
250.0000 mL | Freq: Once | INTRAVENOUS | Status: AC
  Administered 2015-11-19: 250 mL via INTRAVENOUS

## 2015-11-19 MED ORDER — DIPHENHYDRAMINE HCL 25 MG PO CAPS: 25.0000 mg | ORAL_CAPSULE | Freq: Once | ORAL | Status: DC

## 2015-11-19 NOTE — Patient Instructions (Signed)

## 2015-11-20 ENCOUNTER — Ambulatory Visit: Payer: Medicare Other

## 2015-11-20 LAB — TYPE AND SCREEN
ABO/RH(D): AB POS
Antibody Screen: NEGATIVE
Unit division: 0

## 2015-12-24 ENCOUNTER — Other Ambulatory Visit: Payer: Self-pay | Admitting: Medical Oncology

## 2015-12-24 ENCOUNTER — Ambulatory Visit (HOSPITAL_COMMUNITY)
Admission: RE | Admit: 2015-12-24 | Discharge: 2015-12-24 | Disposition: A | Discharge status: Home / Self Care | Payer: Medicare Other | Source: Ambulatory Visit | Attending: Internal Medicine | Admitting: Internal Medicine

## 2015-12-24 ENCOUNTER — Other Ambulatory Visit (HOSPITAL_BASED_OUTPATIENT_CLINIC_OR_DEPARTMENT_OTHER): Payer: Medicare Other

## 2015-12-24 ENCOUNTER — Telehealth: Payer: Self-pay | Admitting: Medical Oncology

## 2015-12-24 DIAGNOSIS — D649 Anemia, unspecified: Secondary | ICD-10-CM

## 2015-12-24 DIAGNOSIS — D638 Anemia in other chronic diseases classified elsewhere: Secondary | ICD-10-CM | POA: Diagnosis not present

## 2015-12-24 DIAGNOSIS — D509 Iron deficiency anemia, unspecified: Secondary | ICD-10-CM

## 2015-12-24 LAB — CBC WITH DIFFERENTIAL/PLATELET
BASO%: 0.3 % (ref 0.0–2.0)
BASOS ABS: 0 10*3/uL (ref 0.0–0.1)
EOS%: 4.5 % (ref 0.0–7.0)
Eosinophils Absolute: 0.1 10*3/uL (ref 0.0–0.5)
HEMATOCRIT: 25.9 % — AB (ref 34.8–46.6)
HGB: 8 g/dL — ABNORMAL LOW (ref 11.6–15.9)
LYMPH#: 0.7 10*3/uL — AB (ref 0.9–3.3)
LYMPH%: 22 % (ref 14.0–49.7)
MCH: 29.3 pg (ref 25.1–34.0)
MCHC: 30.9 g/dL — AB (ref 31.5–36.0)
MCV: 94.9 fL (ref 79.5–101.0)
MONO#: 0.4 10*3/uL (ref 0.1–0.9)
MONO%: 13.6 % (ref 0.0–14.0)
NEUT#: 1.8 10*3/uL (ref 1.5–6.5)
NEUT%: 59.6 % (ref 38.4–76.8)
Platelets: 177 10*3/uL (ref 145–400)
RBC: 2.73 10*6/uL — AB (ref 3.70–5.45)
RDW: 15.7 % — ABNORMAL HIGH (ref 11.2–14.5)
WBC: 3.1 10*3/uL — ABNORMAL LOW (ref 3.9–10.3)

## 2015-12-24 LAB — IRON AND TIBC
%SAT: 33 % (ref 21–57)
IRON: 61 ug/dL (ref 41–142)
TIBC: 186 ug/dL — AB (ref 236–444)
UIBC: 125 ug/dL (ref 120–384)

## 2015-12-24 LAB — FERRITIN: FERRITIN: 927 ng/mL — AB (ref 9–269)

## 2015-12-24 NOTE — Telephone Encounter (Signed)
Reviewed cbc with pt -she is very tired   Transfusion scheduled for sat and pt notified. 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 She did say she may have to stay at [facility] if she has surgery . I instructed her to call tomorrow if she cannot keep appt sat for transfusion. HAR obtained.

## 2015-12-26 ENCOUNTER — Ambulatory Visit: Payer: Medicare Other

## 2015-12-26 VITALS — BP 120/50 | HR 55 | Temp 97.8°F | Resp 17

## 2015-12-26 DIAGNOSIS — D649 Anemia, unspecified: Secondary | ICD-10-CM

## 2015-12-26 LAB — PREPARE RBC (CROSSMATCH)

## 2015-12-26 MED ORDER — ACETAMINOPHEN 325 MG PO TABS: 650.0000 mg | ORAL_TABLET | Freq: Once | ORAL | Status: DC

## 2015-12-26 MED ORDER — SODIUM CHLORIDE 0.9 % IV SOLN
250.0000 mL | Freq: Once | INTRAVENOUS | Status: AC
  Administered 2015-12-26: 250 mL via INTRAVENOUS

## 2015-12-26 MED ORDER — DIPHENHYDRAMINE HCL 25 MG PO CAPS: 25.0000 mg | ORAL_CAPSULE | Freq: Once | ORAL | Status: DC

## 2015-12-26 NOTE — Patient Instructions (Signed)
Blood Transfusion, Care After °Refer to this sheet in the next few weeks. These instructions provide you with information about caring for yourself after your procedure. Your health care provider may also give you more specific instructions. Your treatment has been planned according to current medical practices, but problems sometimes occur. Call your health care provider if you have any problems or questions after your procedure. °WHAT TO EXPECT AFTER THE PROCEDURE °After your procedure, it is common to have: °· Bruising and soreness at the IV site. °· Chills or fever. °· Headache. °HOME CARE INSTRUCTIONS °· Take medicines only as directed by your health care provider. Ask your health care provider if you can take an over-the-counter pain reliever in case you have a fever or headache a day or two after your transfusion. °· Return to your normal activities as directed by your health care provider. °SEEK MEDICAL CARE IF:  °· You develop redness or irritation at your IV site. °· You have persistent fever, chills, or headache. °· Your urine is darker than normal. °· Your urine turns pink, red, or brown.   °· The white part of your eye turns yellow (jaundice).   °· You feel weak after doing your normal activities.   °SEEK IMMEDIATE MEDICAL CARE IF:  °· You have trouble breathing. °· You have fever and chills along with: °¨ Anxiety. °¨ Chest or back pain. °¨ Flushed skin. °¨ Clammy skin. °¨ A rapid heartbeat. °¨ Nausea. °  °This information is not intended to replace advice given to you by your health care provider. Make sure you discuss any questions you have with your health care provider. °  °Document Released: 06/13/2014 Document Reviewed: 06/13/2014 °Elsevier Interactive Patient Education ©2016 [employer]vier Inc. ° °

## 2015-12-27 LAB — TYPE AND SCREEN
ABO/RH(D): AB POS
ANTIBODY SCREEN: NEGATIVE
UNIT DIVISION: 0
UNIT DIVISION: 0

## 2015-12-31 ENCOUNTER — Ambulatory Visit: Payer: Medicare Other | Admitting: Internal Medicine

## 2016-01-01 ENCOUNTER — Telehealth: Payer: Self-pay | Admitting: Internal Medicine

## 2016-01-01 ENCOUNTER — Telehealth: Payer: Self-pay | Admitting: *Deleted

## 2016-01-01 NOTE — Telephone Encounter (Signed)
Pt called states " I need to cancel my appt with MD on 8/10 I have another appt that day." Asked pt if she would like to be transferred to scheduling to speak with someone and make this change. Pt declined states " I'll see what I can do" Asked pt if she would like scheduling to call her and follow up. Pt agreed. POF to scheduling/

## 2016-01-01 NOTE — Telephone Encounter (Signed)
lvm to inform pt of new appt 8/29 at 2 pm per pof

## 2016-01-05 ENCOUNTER — Telehealth: Payer: Self-pay | Admitting: Internal Medicine

## 2016-01-05 ENCOUNTER — Other Ambulatory Visit: Payer: Self-pay | Admitting: Medical Oncology

## 2016-01-05 ENCOUNTER — Ambulatory Visit (HOSPITAL_COMMUNITY)
Admission: RE | Admit: 2016-01-05 | Discharge: 2016-01-05 | Disposition: A | Discharge status: Home / Self Care | Payer: Medicare Other | Source: Ambulatory Visit | Attending: Internal Medicine | Admitting: Internal Medicine

## 2016-01-05 DIAGNOSIS — D509 Iron deficiency anemia, unspecified: Secondary | ICD-10-CM

## 2016-01-05 NOTE — Telephone Encounter (Signed)
Added blood trans apt, per pof pt already has been notified of apt times.

## 2016-01-14 ENCOUNTER — Ambulatory Visit: Payer: Medicare Other | Admitting: Internal Medicine

## 2016-01-16 ENCOUNTER — Emergency Department (HOSPITAL_COMMUNITY): Payer: Medicare Other

## 2016-01-16 ENCOUNTER — Emergency Department (HOSPITAL_COMMUNITY)
Admission: EM | Admit: 2016-01-16 | Discharge: 2016-01-16 | Disposition: A | Discharge status: Other Acute Inpatient Hospital | Payer: Medicare Other | Attending: Emergency Medicine | Admitting: Emergency Medicine

## 2016-01-16 ENCOUNTER — Encounter (HOSPITAL_COMMUNITY): Payer: Self-pay | Admitting: Emergency Medicine

## 2016-01-16 DIAGNOSIS — Z79899 Other long term (current) drug therapy: Secondary | ICD-10-CM | POA: Insufficient documentation

## 2016-01-16 DIAGNOSIS — E876 Hypokalemia: Secondary | ICD-10-CM | POA: Insufficient documentation

## 2016-01-16 DIAGNOSIS — E860 Dehydration: Secondary | ICD-10-CM | POA: Insufficient documentation

## 2016-01-16 DIAGNOSIS — K5660 Unspecified intestinal obstruction: Secondary | ICD-10-CM | POA: Insufficient documentation

## 2016-01-16 DIAGNOSIS — Z87891 Personal history of nicotine dependence: Secondary | ICD-10-CM | POA: Insufficient documentation

## 2016-01-16 DIAGNOSIS — R4182 Altered mental status, unspecified: Secondary | ICD-10-CM | POA: Diagnosis present

## 2016-01-16 DIAGNOSIS — K56609 Unspecified intestinal obstruction, unspecified as to partial versus complete obstruction: Secondary | ICD-10-CM

## 2016-01-16 LAB — CBC
HEMATOCRIT: 37.2 % (ref 36.0–46.0)
HEMOGLOBIN: 12.1 g/dL (ref 12.0–15.0)
MCH: 29.8 pg (ref 26.0–34.0)
MCHC: 32.5 g/dL (ref 30.0–36.0)
MCV: 91.6 fL (ref 78.0–100.0)
Platelets: 228 10*3/uL (ref 150–400)
RBC: 4.06 MIL/uL (ref 3.87–5.11)
RDW: 15.1 % (ref 11.5–15.5)
WBC: 4.2 10*3/uL (ref 4.0–10.5)

## 2016-01-16 LAB — COMPREHENSIVE METABOLIC PANEL
ALBUMIN: 2.2 g/dL — AB (ref 3.5–5.0)
ALT: 19 U/L (ref 14–54)
ANION GAP: 14 (ref 5–15)
AST: 18 U/L (ref 15–41)
Alkaline Phosphatase: 51 U/L (ref 38–126)
BILIRUBIN TOTAL: 1.2 mg/dL (ref 0.3–1.2)
BUN: 18 mg/dL (ref 6–20)
CO2: 26 mmol/L (ref 22–32)
Calcium: 8.8 mg/dL — ABNORMAL LOW (ref 8.9–10.3)
Chloride: 96 mmol/L — ABNORMAL LOW (ref 101–111)
Creatinine, Ser: 0.91 mg/dL (ref 0.44–1.00)
GFR calc Af Amer: >60 mL/min (ref >60)
GFR calc non Af Amer: >60 mL/min (ref >60)
GLUCOSE: 128 mg/dL — AB (ref 65–99)
POTASSIUM: 2.9 mmol/L — AB (ref 3.5–5.1)
SODIUM: 136 mmol/L (ref 135–145)
TOTAL PROTEIN: 6.2 g/dL — AB (ref 6.5–8.1)

## 2016-01-16 LAB — URINALYSIS, ROUTINE W REFLEX MICROSCOPIC
Glucose, UA: NEGATIVE mg/dL
Hgb urine dipstick: NEGATIVE
KETONES UR: 15 mg/dL — AB
LEUKOCYTES UA: NEGATIVE
NITRITE: NEGATIVE
PH: 5 (ref 5.0–8.0)
Protein, ur: NEGATIVE mg/dL
Specific Gravity, Urine: 1.019 (ref 1.005–1.030)

## 2016-01-16 LAB — AMMONIA: AMMONIA: 40 umol/L — AB (ref 9–35)

## 2016-01-16 MED ORDER — METOCLOPRAMIDE HCL 5 MG/ML IJ SOLN
5.0000 mg | Freq: Once | INTRAMUSCULAR | Status: AC
  Administered 2016-01-16: 5 mg via INTRAVENOUS
  Filled 2016-01-16: qty 2

## 2016-01-16 MED ORDER — FENTANYL CITRATE (PF) 100 MCG/2ML IJ SOLN
100.0000 ug | Freq: Once | INTRAMUSCULAR | Status: AC
  Administered 2016-01-16: 100 ug via INTRAVENOUS
  Filled 2016-01-16: qty 2

## 2016-01-16 MED ORDER — SODIUM CHLORIDE 0.9 % IV BOLUS (SEPSIS)
1000.0000 mL | Freq: Once | INTRAVENOUS | Status: AC
  Administered 2016-01-16: 1000 mL via INTRAVENOUS

## 2016-01-16 MED ORDER — POTASSIUM CHLORIDE 10 MEQ/100ML IV SOLN
10.0000 meq | Freq: Once | INTRAVENOUS | Status: AC
  Administered 2016-01-16: 10 meq via INTRAVENOUS
  Filled 2016-01-16: qty 100

## 2016-01-16 MED ORDER — MORPHINE SULFATE (PF) 4 MG/ML IV SOLN
4.0000 mg | Freq: Once | INTRAVENOUS | Status: AC
  Administered 2016-01-16: 4 mg via INTRAVENOUS
  Filled 2016-01-16: qty 1

## 2016-01-16 NOTE — ED Triage Notes (Signed)
From home via EMS: Husband reports disorientation, confusion, and lethargy for two days. 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 Has not eaten more than a small amount (toast & cheese) for a week. Alert & oriented x4 to orientation questions, but confusion present in conversation and repeats the same questions.

## 2016-01-16 NOTE — ED Notes (Signed)
Patient returned from X-ray 

## 2016-01-16 NOTE — ED Notes (Signed)
Reassessed pt's pain level at this time. Pt denies having pain however pt is noted to be restless and fidgeting in bed at this time. Pt keeps eyes closed and pt is noted to be frowning at this time.

## 2016-01-16 NOTE — ED Notes (Signed)
Contacted [facility] for transfer

## 2016-01-16 NOTE — ED Provider Notes (Signed)
[facility] DEPT Provider Note   CSN: [id number] Arrival date & time: 01/16/16  1650  First Provider Contact:  First MD Initiated Contact with Patient 01/16/16 1716        History   Chief Complaint Chief Complaint  Patient presents with  . Altered Mental Status   Abdominal pain HPI Kim [name] is a 75 y.o. female. History is obtained from patient and from patient's husband. Patient complains of diffuse abdominal pain progressively worsening for approximately the past 2 weeks. She's had no output from her ostomy for the past 3 days. She vomited one time today. No fever. Her husband also reports that she's had diminished oral intake and has been confused progressively more over the past 3 days. No treatment prior to coming here. Patient had a CT scan of her abdomen pelvis at [facility] and 12/25/2015 consistent with functional obstruction. She is scheduled for interview with surgeon within the next level days.  HPI  Past Medical History:  Diagnosis Date  . Anemia    related to malabsorption--gets B12 shots and iron infusions, managed by Dr. Julien Nordmann  . BCC (basal cell carcinoma of skin)   . Crohn disease ([place])    Dr. Emelda Fear at [facility]  . Glaucoma    Dr. Janyth Contes  . Intestinal malabsorption   . OA (osteoarthritis)    hands,back  . Osteoporosis    managed by Dr. Nevada Crane at [facility], [place] q2 yrs    Patient Active Problem List   Diagnosis Date Noted  . Osteoporosis 03/18/2013  . Glaucoma 03/18/2013  . 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 OA (osteoarthritis)   . Anemia of chronic disease 06/07/2012  . Iron deficiency anemia 05/10/2011    Past Surgical History:  Procedure Laterality Date  . Crohn  2009  . internal ostomy pouch  1994  . TOTAL COLECTOMY  1984   due to Crohn's    OB History    No data available       Home Medications    Prior to Admission medications   Medication Sig Start Date End Date Taking?  Authorizing Provider  acetaminophen (TYLENOL) 500 MG tablet Take 500 mg by mouth every 6 (six) hours as needed for pain.   Yes Historical Provider, MD  calcium citrate (CALCITRATE - DOSED IN MG ELEMENTAL CALCIUM) 950 MG tablet Take 1,200 mg of elemental calcium by mouth daily.    Yes Historical Provider, MD  cholecalciferol (VITAMIN D) 1000 UNITS tablet Take 2,000 Units by mouth daily.    Yes Historical Provider, MD  cyanocobalamin (,VITAMIN B-12,) 1000 MCG/ML injection INJECT 1 MILLILITER INTO MUSCLE EVERY 30 DAYS 05/01/15  Yes Curt Bears, MD  dorzolamide-timolol (COSOPT) 22.3-6.8 MG/ML ophthalmic solution Place 1 drop into both eyes daily.    Yes Historical Provider, MD  gabapentin (NEURONTIN) 300 MG capsule Take 300 mg by mouth 3 (three) times daily.  08/09/13  Yes Historical Provider, MD  HYDROcodone-acetaminophen (NORCO/VICODIN) 5-325 MG tablet Take 1 tablet by mouth every 6 (six) hours as needed for moderate pain.   Yes Historical Provider, MD  latanoprost (XALATAN) 0.005 % ophthalmic solution Place 1 drop into both eyes at bedtime. Place into both eyes Daily. 03/07/12  Yes Historical Provider, MD  Melatonin 10 MG TBCR Take by mouth. Take one at bedtime   Yes Historical Provider, MD  simethicone (MYLICON) 80 MG chewable tablet Chew 80 mg by mouth every 6 (six) hours as needed for flatulence.  Yes Historical Provider, MD    Family History Family History  Problem Relation Age of Onset  . Diabetes Mother     borderline  . Heart disease Mother     MI  . Heart disease Father   . Thyroid disease Sister     Social History Social History  Substance Use Topics  . Smoking status: Former Smoker    Quit date: 06/06/1972  . Smokeless tobacco: Never Used  . Alcohol use No     Allergies   Antihistamines, chlorpheniramine-type; Metronidazole; and Sulfa antibiotics   Review of Systems Review of Systems  Constitutional: Positive for appetite change.  HENT: Negative.   Respiratory:  Negative.   Cardiovascular: Negative.   Gastrointestinal: Positive for abdominal pain and vomiting.  Musculoskeletal: Negative.   Skin: Negative.   Neurological: Negative.        Confusion  Psychiatric/Behavioral: Negative.   All other systems reviewed and are negative.    Physical Exam Updated Vital Signs BP 142/64 (BP Location: Right Arm)   Pulse 85   Temp 97.9 F (36.6 C) (Oral)   Resp 17   Ht 4' 9"  (1.448 m)   Wt 80 lb (36.3 kg)   SpO2 97%   BMI 17.31 kg/m   Physical Exam  Constitutional:  Chronically ill-appearing  HENT:  Head: Normocephalic and atraumatic.  Mucous membranes dry  Eyes: Conjunctivae are normal. Pupils are equal, round, and reactive to light.  Neck: Neck supple. No tracheal deviation present. No thyromegaly present.  Cardiovascular: Normal rate and regular rhythm.   No murmur heard. Pulmonary/Chest: Effort normal and breath sounds normal.  Abdominal: Soft. Bowel sounds are normal. She exhibits no distension and no mass. There is no tenderness. There is no rebound and no guarding.  Tender over lower quadrants  Musculoskeletal: Normal range of motion. She exhibits no edema or tenderness.  Neurological: She is alert. Coordination normal.  Skin: Skin is warm and dry. No rash noted.  Psychiatric: She has a normal mood and affect.  Nursing note and vitals reviewed.    ED Treatments / Results  Labs (all labs ordered are listed, but only abnormal results are displayed) Labs Reviewed  CBC  COMPREHENSIVE METABOLIC PANEL  URINALYSIS, ROUTINE W REFLEX MICROSCOPIC (NOT AT [facility])  AMMONIA   Results for orders placed or performed during the hospital encounter of 01/16/16  Comprehensive metabolic panel  Result Value Ref Range   Sodium 136 135 - 145 mmol/L   Potassium 2.9 (L) 3.5 - 5.1 mmol/L   Chloride 96 (L) 101 - 111 mmol/L   CO2 26 22 - 32 mmol/L   Glucose, Bld 128 (H) 65 - 99 mg/dL   BUN 18 6 - 20 mg/dL   Creatinine, Ser 0.91 0.44 - 1.00 mg/dL    Calcium 8.8 (L) 8.9 - 10.3 mg/dL   Total Protein 6.2 (L) 6.5 - 8.1 g/dL   Albumin 2.2 (L) 3.5 - 5.0 g/dL   AST 18 15 - 41 U/L   ALT 19 14 - 54 U/L   Alkaline Phosphatase 51 38 - 126 U/L   Total Bilirubin 1.2 0.3 - 1.2 mg/dL   GFR calc non Af Amer >60 >60 mL/min   GFR calc Af Amer >60 >60 mL/min   Anion gap 14 5 - 15  CBC  Result Value Ref Range   WBC 4.2 4.0 - 10.5 K/uL   RBC 4.06 3.87 - 5.11 MIL/uL   Hemoglobin 12.1 12.0 - 15.0 g/dL   HCT 37.2 36.0 -  46.0 %   MCV 91.6 78.0 - 100.0 fL   MCH 29.8 26.0 - 34.0 pg   MCHC 32.5 30.0 - 36.0 g/dL   RDW 15.1 11.5 - 15.5 %   Platelets 228 150 - 400 K/uL  Urinalysis, Routine w reflex microscopic (not at [facility])  Result Value Ref Range   Color, Urine AMBER (A) YELLOW   APPearance CLEAR CLEAR   Specific Gravity, Urine 1.019 1.005 - 1.030   pH 5.0 5.0 - 8.0   Glucose, UA NEGATIVE NEGATIVE mg/dL   Hgb urine dipstick NEGATIVE NEGATIVE   Bilirubin Urine SMALL (A) NEGATIVE   Ketones, ur 15 (A) NEGATIVE mg/dL   Protein, ur NEGATIVE NEGATIVE mg/dL   Nitrite NEGATIVE NEGATIVE   Leukocytes, UA NEGATIVE NEGATIVE  Ammonia  Result Value Ref Range   Ammonia 40 (H) 9 - 35 umol/L   Dg Abd Acute W/chest  Result Date: 01/16/2016 CLINICAL DATA:  Worsening effusion and lethargy for 2 days. Crohn's disease. EXAM: DG ABDOMEN ACUTE W/ 1V CHEST COMPARISON:  11/19/2007 FINDINGS: Moderate to severe small bowel dilatation is seen within the central abdomen and pelvis, with several air-fluid levels. Surgical clips and staples seen in the pelvis. This is suspicious for distal small bowel obstruction. No evidence of free air. Heart size and mediastinal contours are within normal limits. Both lungs are clear. IMPRESSION: Moderate to severe small bowel dilatation, suspicious for distal small bowel obstruction. No active cardiopulmonary disease. Electronically Signed   By: Earle Gell M.D.   On: 01/16/2016 18:41   EKG  EKG Interpretation None       Radiology No  results found. X-rays viewed by me  9:10 PM pain is improved over treatment with intravenous opioids and intravenous fluids however requesting additional medicine for nausea. IV Reglan ordered Procedures Procedures (including critical care time)  Medications Ordered in ED Medications  sodium chloride 0.9 % bolus 1,000 mL (not administered)  fentaNYL (SUBLIMAZE) injection 100 mcg (not administered)   Dr.Shogan from [facility] consulted via telephone at patient request and accepts patient in transfer . Initial Impression / Assessment and Plan / ED Course  I have reviewed the triage vital signs and the nursing notes.  Pertinent labs & imaging results that were available during my care of the patient were reviewed by me and considered in my medical decision making (see chart for details).  Clinical Course      Final Clinical Impressions(s) / ED Diagnoses  diagnosiss #1 small bowel obstruction #2dehydration #3 hypokalemia Final diagnoses:  None    New Prescriptions New Prescriptions   No medications on file     Orlie Dakin, MD 01/16/16 2120

## 2016-01-16 NOTE — ED Notes (Signed)
Pt transported to [facility] at this time via [facility] transport.  All of pt's belongings taken home by husband at time of transfer.  Pt in no apparent distress at this time.  Continue pt's peripheral IV site and cardiac monitor upon transfer.

## 2016-01-16 NOTE — ED Notes (Signed)
REcontacted [facility]/disk of XRAY ready

## 2016-01-21 ENCOUNTER — Telehealth: Payer: Self-pay | Admitting: *Deleted

## 2016-01-21 NOTE — Telephone Encounter (Signed)
Patient called to cancel lab/md appointment due to pending surgery. Patient states she will call back to reschedule.

## 2016-01-29 ENCOUNTER — Other Ambulatory Visit: Payer: Medicare Other

## 2016-02-02 ENCOUNTER — Ambulatory Visit: Payer: Medicare Other | Admitting: Internal Medicine

## 2016-03-05 ENCOUNTER — Inpatient Hospital Stay (HOSPITAL_COMMUNITY): Payer: Medicare Other | Admitting: Anesthesiology

## 2016-03-05 ENCOUNTER — Encounter (HOSPITAL_COMMUNITY): Payer: Self-pay

## 2016-03-05 ENCOUNTER — Inpatient Hospital Stay (HOSPITAL_COMMUNITY)
Admission: EM | Admit: 2016-03-05 | Discharge: 2016-03-13 | DRG: 480 | Disposition: A | Discharge status: Skilled Nursing Facility | Payer: Medicare Other | Attending: Family Medicine | Admitting: Family Medicine

## 2016-03-05 ENCOUNTER — Inpatient Hospital Stay (HOSPITAL_COMMUNITY): Payer: Medicare Other

## 2016-03-05 ENCOUNTER — Encounter (HOSPITAL_COMMUNITY)
Admission: EM | Disposition: A | Discharge status: Skilled Nursing Facility | Payer: Self-pay | Source: Home / Self Care | Attending: Family Medicine

## 2016-03-05 ENCOUNTER — Emergency Department (HOSPITAL_COMMUNITY): Payer: Medicare Other

## 2016-03-05 DIAGNOSIS — A318 Other mycobacterial infections: Secondary | ICD-10-CM | POA: Diagnosis present

## 2016-03-05 DIAGNOSIS — Z681 Body mass index (BMI) 19 or less, adult: Secondary | ICD-10-CM

## 2016-03-05 DIAGNOSIS — S72009A Fracture of unspecified part of neck of unspecified femur, initial encounter for closed fracture: Secondary | ICD-10-CM | POA: Diagnosis present

## 2016-03-05 DIAGNOSIS — E43 Unspecified severe protein-calorie malnutrition: Secondary | ICD-10-CM | POA: Insufficient documentation

## 2016-03-05 DIAGNOSIS — W19XXXA Unspecified fall, initial encounter: Secondary | ICD-10-CM

## 2016-03-05 DIAGNOSIS — R911 Solitary pulmonary nodule: Secondary | ICD-10-CM | POA: Diagnosis present

## 2016-03-05 DIAGNOSIS — Z87891 Personal history of nicotine dependence: Secondary | ICD-10-CM

## 2016-03-05 DIAGNOSIS — E46 Unspecified protein-calorie malnutrition: Secondary | ICD-10-CM | POA: Diagnosis present

## 2016-03-05 DIAGNOSIS — G629 Polyneuropathy, unspecified: Secondary | ICD-10-CM | POA: Diagnosis present

## 2016-03-05 DIAGNOSIS — S72142A Displaced intertrochanteric fracture of left femur, initial encounter for closed fracture: Secondary | ICD-10-CM

## 2016-03-05 DIAGNOSIS — E871 Hypo-osmolality and hyponatremia: Secondary | ICD-10-CM | POA: Diagnosis present

## 2016-03-05 DIAGNOSIS — D509 Iron deficiency anemia, unspecified: Secondary | ICD-10-CM | POA: Diagnosis present

## 2016-03-05 DIAGNOSIS — W109XXA Fall (on) (from) unspecified stairs and steps, initial encounter: Secondary | ICD-10-CM | POA: Diagnosis present

## 2016-03-05 DIAGNOSIS — E876 Hypokalemia: Secondary | ICD-10-CM | POA: Diagnosis present

## 2016-03-05 DIAGNOSIS — D638 Anemia in other chronic diseases classified elsewhere: Secondary | ICD-10-CM | POA: Diagnosis present

## 2016-03-05 DIAGNOSIS — M25552 Pain in left hip: Secondary | ICD-10-CM | POA: Diagnosis present

## 2016-03-05 DIAGNOSIS — Z8249 Family history of ischemic heart disease and other diseases of the circulatory system: Secondary | ICD-10-CM

## 2016-03-05 DIAGNOSIS — Z9049 Acquired absence of other specified parts of digestive tract: Secondary | ICD-10-CM | POA: Diagnosis not present

## 2016-03-05 DIAGNOSIS — K50813 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: Secondary | ICD-10-CM

## 2016-03-05 DIAGNOSIS — S72002A Fracture of unspecified part of neck of left femur, initial encounter for closed fracture: Secondary | ICD-10-CM | POA: Diagnosis not present

## 2016-03-05 DIAGNOSIS — M1990 Unspecified osteoarthritis, unspecified site: Secondary | ICD-10-CM | POA: Diagnosis present

## 2016-03-05 DIAGNOSIS — H409 Unspecified glaucoma: Secondary | ICD-10-CM | POA: Diagnosis present

## 2016-03-05 DIAGNOSIS — Z79899 Other long term (current) drug therapy: Secondary | ICD-10-CM

## 2016-03-05 DIAGNOSIS — N179 Acute kidney failure, unspecified: Secondary | ICD-10-CM | POA: Diagnosis present

## 2016-03-05 DIAGNOSIS — Z932 Ileostomy status: Secondary | ICD-10-CM | POA: Diagnosis not present

## 2016-03-05 DIAGNOSIS — M810 Age-related osteoporosis without current pathological fracture: Secondary | ICD-10-CM | POA: Diagnosis present

## 2016-03-05 DIAGNOSIS — D62 Acute posthemorrhagic anemia: Secondary | ICD-10-CM | POA: Diagnosis not present

## 2016-03-05 DIAGNOSIS — R918 Other nonspecific abnormal finding of lung field: Secondary | ICD-10-CM | POA: Diagnosis present

## 2016-03-05 DIAGNOSIS — Z85828 Personal history of other malignant neoplasm of skin: Secondary | ICD-10-CM

## 2016-03-05 DIAGNOSIS — S72002D Fracture of unspecified part of neck of left femur, subsequent encounter for closed fracture with routine healing: Secondary | ICD-10-CM | POA: Diagnosis not present

## 2016-03-05 DIAGNOSIS — Y92009 Unspecified place in unspecified non-institutional (private) residence as the place of occurrence of the external cause: Secondary | ICD-10-CM

## 2016-03-05 DIAGNOSIS — Z419 Encounter for procedure for purposes other than remedying health state, unspecified: Secondary | ICD-10-CM

## 2016-03-05 HISTORY — PX: INTRAMEDULLARY (IM) NAIL INTERTROCHANTERIC: SHX5875

## 2016-03-05 LAB — CBC WITH DIFFERENTIAL/PLATELET
BASOS ABS: 0 10*3/uL (ref 0.0–0.1)
Basophils Relative: 0 %
EOS PCT: 1 %
Eosinophils Absolute: 0.1 10*3/uL (ref 0.0–0.7)
HEMATOCRIT: 36.1 % (ref 36.0–46.0)
Hemoglobin: 11.4 g/dL — ABNORMAL LOW (ref 12.0–15.0)
LYMPHS ABS: 0.9 10*3/uL (ref 0.7–4.0)
LYMPHS PCT: 10 %
MCH: 31.1 pg (ref 26.0–34.0)
MCHC: 31.6 g/dL (ref 30.0–36.0)
MCV: 98.6 fL (ref 78.0–100.0)
MONO ABS: 0.6 10*3/uL (ref 0.1–1.0)
MONOS PCT: 6 %
NEUTROS ABS: 7.3 10*3/uL (ref 1.7–7.7)
Neutrophils Relative %: 83 %
Platelets: 155 10*3/uL (ref 150–400)
RBC: 3.66 MIL/uL — ABNORMAL LOW (ref 3.87–5.11)
RDW: 16.5 % — AB (ref 11.5–15.5)
WBC: 8.8 10*3/uL (ref 4.0–10.5)

## 2016-03-05 LAB — BASIC METABOLIC PANEL
ANION GAP: 9 (ref 5–15)
BUN: 21 mg/dL — AB (ref 6–20)
CHLORIDE: 96 mmol/L — AB (ref 101–111)
CO2: 26 mmol/L (ref 22–32)
Calcium: 9.2 mg/dL (ref 8.9–10.3)
Creatinine, Ser: 0.85 mg/dL (ref 0.44–1.00)
GFR calc Af Amer: >60 mL/min (ref >60)
GFR calc non Af Amer: >60 mL/min (ref >60)
GLUCOSE: 97 mg/dL (ref 65–99)
POTASSIUM: 3.6 mmol/L (ref 3.5–5.1)
Sodium: 131 mmol/L — ABNORMAL LOW (ref 135–145)

## 2016-03-05 LAB — PROTIME-INR
INR: 1.09
Prothrombin Time: 14.1 seconds (ref 11.4–15.2)

## 2016-03-05 SURGERY — FIXATION, FRACTURE, INTERTROCHANTERIC, WITH INTRAMEDULLARY ROD
Anesthesia: General | Site: Leg Upper | Laterality: Left

## 2016-03-05 MED ORDER — LATANOPROST 0.005 % OP SOLN
1.0000 [drp] | Freq: Every day | OPHTHALMIC | Status: DC
  Administered 2016-03-05 – 2016-03-12 (×4): 1 [drp] via OPHTHALMIC
  Filled 2016-03-05: qty 2.5

## 2016-03-05 MED ORDER — DORZOLAMIDE HCL 2 % OP SOLN
1.0000 [drp] | Freq: Two times a day (BID) | OPHTHALMIC | Status: DC
  Administered 2016-03-06 – 2016-03-13 (×8): 1 [drp] via OPHTHALMIC
  Filled 2016-03-05 (×2): qty 10

## 2016-03-05 MED ORDER — CHLORHEXIDINE GLUCONATE 4 % EX LIQD: 60.0000 mL | Freq: Once | CUTANEOUS | Status: DC

## 2016-03-05 MED ORDER — CEFAZOLIN IN D5W 1 GM/50ML IV SOLN
1.0000 g | Freq: Four times a day (QID) | INTRAVENOUS | Status: AC
  Administered 2016-03-05 – 2016-03-06 (×3): 1 g via INTRAVENOUS
  Filled 2016-03-05 (×3): qty 50

## 2016-03-05 MED ORDER — CEFAZOLIN SODIUM-DEXTROSE 2-4 GM/100ML-% IV SOLN
2.0000 g | INTRAVENOUS | Status: AC
  Administered 2016-03-05: 2 g via INTRAVENOUS
  Filled 2016-03-05: qty 100

## 2016-03-05 MED ORDER — FENTANYL CITRATE (PF) 100 MCG/2ML IJ SOLN
INTRAMUSCULAR | Status: AC
  Filled 2016-03-05: qty 4

## 2016-03-05 MED ORDER — ROCURONIUM BROMIDE 100 MG/10ML IV SOLN
INTRAVENOUS | Status: DC | PRN
  Administered 2016-03-05: 40 mg via INTRAVENOUS

## 2016-03-05 MED ORDER — ACETAMINOPHEN 650 MG RE SUPP: 650.0000 mg | Freq: Four times a day (QID) | RECTAL | Status: DC | PRN

## 2016-03-05 MED ORDER — CALCIUM CITRATE 950 (200 CA) MG PO TABS: 1200.0000 mg | ORAL_TABLET | Freq: Every day | ORAL | Status: DC

## 2016-03-05 MED ORDER — VITAMIN D 1000 UNITS PO TABS
2000.0000 [IU] | ORAL_TABLET | Freq: Every day | ORAL | Status: DC
  Administered 2016-03-06 – 2016-03-13 (×8): 2000 [IU] via ORAL
  Filled 2016-03-05 (×8): qty 2

## 2016-03-05 MED ORDER — DORZOLAMIDE HCL-TIMOLOL MAL 2-0.5 % OP SOLN
1.0000 [drp] | Freq: Every day | OPHTHALMIC | Status: DC
  Filled 2016-03-05: qty 10

## 2016-03-05 MED ORDER — OXYCODONE HCL 5 MG PO TABS
5.0000 mg | ORAL_TABLET | ORAL | Status: DC | PRN
  Administered 2016-03-05 – 2016-03-06 (×4): 10 mg via ORAL
  Administered 2016-03-07 (×2): 5 mg via ORAL
  Administered 2016-03-07 – 2016-03-09 (×5): 10 mg via ORAL
  Filled 2016-03-05 (×2): qty 1
  Filled 2016-03-05 (×9): qty 2

## 2016-03-05 MED ORDER — PROPOFOL 10 MG/ML IV BOLUS
INTRAVENOUS | Status: DC | PRN
  Administered 2016-03-05: 60 mg via INTRAVENOUS

## 2016-03-05 MED ORDER — MORPHINE SULFATE (PF) 2 MG/ML IV SOLN
1.0000 mg | INTRAVENOUS | Status: DC | PRN
  Administered 2016-03-05 – 2016-03-06 (×5): 1 mg via INTRAVENOUS
  Filled 2016-03-05 (×5): qty 1

## 2016-03-05 MED ORDER — SODIUM CHLORIDE 0.9 % IV SOLN
INTRAVENOUS | Status: DC
  Administered 2016-03-05: 22:00:00 via INTRAVENOUS

## 2016-03-05 MED ORDER — FENTANYL CITRATE (PF) 100 MCG/2ML IJ SOLN
INTRAMUSCULAR | Status: AC
  Administered 2016-03-05: 25 ug via INTRAVENOUS
  Filled 2016-03-05: qty 2

## 2016-03-05 MED ORDER — PHENYLEPHRINE HCL 10 MG/ML IJ SOLN
INTRAVENOUS | Status: DC | PRN
  Administered 2016-03-05: 15 ug/min via INTRAVENOUS

## 2016-03-05 MED ORDER — FENTANYL CITRATE (PF) 100 MCG/2ML IJ SOLN
INTRAMUSCULAR | Status: DC | PRN
  Administered 2016-03-05 (×4): 50 ug via INTRAVENOUS

## 2016-03-05 MED ORDER — OXYCODONE HCL 5 MG PO TABS: 5.0000 mg | ORAL_TABLET | ORAL | 0 refills | Status: DC | PRN

## 2016-03-05 MED ORDER — METOCLOPRAMIDE HCL 5 MG/ML IJ SOLN: 5.0000 mg | Freq: Three times a day (TID) | INTRAMUSCULAR | Status: DC | PRN

## 2016-03-05 MED ORDER — FENTANYL CITRATE (PF) 100 MCG/2ML IJ SOLN
50.0000 ug | INTRAMUSCULAR | Status: AC | PRN
  Administered 2016-03-05 (×2): 50 ug via INTRAVENOUS
  Filled 2016-03-05 (×2): qty 2

## 2016-03-05 MED ORDER — LACTATED RINGERS IV SOLN
INTRAVENOUS | Status: DC
  Administered 2016-03-05 (×2): via INTRAVENOUS

## 2016-03-05 MED ORDER — 0.9 % SODIUM CHLORIDE (POUR BTL) OPTIME
TOPICAL | Status: DC | PRN
  Administered 2016-03-05: 1000 mL

## 2016-03-05 MED ORDER — ENOXAPARIN SODIUM 40 MG/0.4ML ~~LOC~~ SOLN: 40.0000 mg | SUBCUTANEOUS | Status: DC

## 2016-03-05 MED ORDER — GABAPENTIN 300 MG PO CAPS
300.0000 mg | ORAL_CAPSULE | Freq: Three times a day (TID) | ORAL | Status: DC
  Administered 2016-03-05 – 2016-03-13 (×21): 300 mg via ORAL
  Filled 2016-03-05 (×23): qty 1

## 2016-03-05 MED ORDER — DOCUSATE SODIUM 100 MG PO CAPS
100.0000 mg | ORAL_CAPSULE | Freq: Two times a day (BID) | ORAL | Status: DC
  Administered 2016-03-06 – 2016-03-11 (×4): 100 mg via ORAL
  Filled 2016-03-05 (×15): qty 1

## 2016-03-05 MED ORDER — ONDANSETRON HCL 4 MG PO TABS: 4.0000 mg | ORAL_TABLET | Freq: Four times a day (QID) | ORAL | Status: DC | PRN

## 2016-03-05 MED ORDER — POVIDONE-IODINE 10 % EX SWAB: 2.0000 "application " | Freq: Once | CUTANEOUS | Status: DC

## 2016-03-05 MED ORDER — TIMOLOL MALEATE 0.5 % OP SOLN
1.0000 [drp] | Freq: Two times a day (BID) | OPHTHALMIC | Status: DC
  Administered 2016-03-06 – 2016-03-13 (×10): 1 [drp] via OPHTHALMIC
  Filled 2016-03-05: qty 5

## 2016-03-05 MED ORDER — ACETAMINOPHEN 500 MG PO TABS
1000.0000 mg | ORAL_TABLET | Freq: Four times a day (QID) | ORAL | Status: AC
  Administered 2016-03-05 – 2016-03-06 (×3): 1000 mg via ORAL
  Filled 2016-03-05 (×4): qty 2

## 2016-03-05 MED ORDER — PHENYLEPHRINE HCL 10 MG/ML IJ SOLN: INTRAMUSCULAR | Status: DC | PRN

## 2016-03-05 MED ORDER — ONDANSETRON HCL 4 MG/2ML IJ SOLN: 4.0000 mg | Freq: Four times a day (QID) | INTRAMUSCULAR | Status: DC | PRN

## 2016-03-05 MED ORDER — ENOXAPARIN SODIUM 40 MG/0.4ML ~~LOC~~ SOLN: 40.0000 mg | SUBCUTANEOUS | 0 refills | Status: DC

## 2016-03-05 MED ORDER — SUGAMMADEX SODIUM 200 MG/2ML IV SOLN
INTRAVENOUS | Status: DC | PRN
  Administered 2016-03-05: 100 mg via INTRAVENOUS

## 2016-03-05 MED ORDER — PHENYLEPHRINE HCL 10 MG/ML IJ SOLN
INTRAMUSCULAR | Status: DC | PRN
  Administered 2016-03-05 (×2): 80 ug via INTRAVENOUS
  Administered 2016-03-05: 40 ug via INTRAVENOUS
  Administered 2016-03-05 (×2): 80 ug via INTRAVENOUS

## 2016-03-05 MED ORDER — METOCLOPRAMIDE HCL 5 MG PO TABS: 5.0000 mg | ORAL_TABLET | Freq: Three times a day (TID) | ORAL | Status: DC | PRN

## 2016-03-05 MED ORDER — ACETAMINOPHEN 325 MG PO TABS
650.0000 mg | ORAL_TABLET | Freq: Four times a day (QID) | ORAL | Status: DC | PRN
  Administered 2016-03-07 – 2016-03-11 (×3): 650 mg via ORAL
  Filled 2016-03-05 (×3): qty 2

## 2016-03-05 MED ORDER — ENOXAPARIN SODIUM 40 MG/0.4ML ~~LOC~~ SOLN
40.0000 mg | SUBCUTANEOUS | Status: DC
Start: 2016-03-06 — End: 2016-03-13
  Administered 2016-03-06 – 2016-03-13 (×7): 40 mg via SUBCUTANEOUS
  Filled 2016-03-05 (×8): qty 0.4

## 2016-03-05 MED ORDER — FENTANYL CITRATE (PF) 100 MCG/2ML IJ SOLN
25.0000 ug | INTRAMUSCULAR | Status: DC | PRN
  Administered 2016-03-05 (×2): 25 ug via INTRAVENOUS
  Administered 2016-03-05: 50 ug via INTRAVENOUS

## 2016-03-05 MED ORDER — FENTANYL CITRATE (PF) 100 MCG/2ML IJ SOLN
50.0000 ug | INTRAMUSCULAR | Status: DC | PRN
  Administered 2016-03-05: 50 ug via INTRAVENOUS
  Filled 2016-03-05: qty 2

## 2016-03-05 SURGICAL SUPPLY — 49 items
BLADE SURG 15 STRL LF DISP TIS (BLADE) ×1 IMPLANT
BLADE SURG 15 STRL SS (BLADE) ×1
BNDG COHESIVE 4X5 TAN NS LF (GAUZE/BANDAGES/DRESSINGS) ×2 IMPLANT
BNDG COHESIVE 6X5 TAN STRL LF (GAUZE/BANDAGES/DRESSINGS) IMPLANT
BNDG GAUZE ELAST 4 BULKY (GAUZE/BANDAGES/DRESSINGS) ×2 IMPLANT
COVER PERINEAL POST (MISCELLANEOUS) ×2 IMPLANT
COVER SURGICAL LIGHT HANDLE (MISCELLANEOUS) ×2 IMPLANT
DRAPE PROXIMA HALF (DRAPES) IMPLANT
DRAPE STERI IOBAN 125X83 (DRAPES) ×2 IMPLANT
DRSG MEPILEX BORDER 4X12 (GAUZE/BANDAGES/DRESSINGS) ×2 IMPLANT
DRSG MEPILEX BORDER 4X4 (GAUZE/BANDAGES/DRESSINGS) ×2 IMPLANT
DRSG MEPILEX BORDER 4X8 (GAUZE/BANDAGES/DRESSINGS) ×2 IMPLANT
DRSG PAD ABDOMINAL 8X10 ST (GAUZE/BANDAGES/DRESSINGS) ×4 IMPLANT
DURAPREP 26ML APPLICATOR (WOUND CARE) ×2 IMPLANT
ELECT CAUTERY BLADE 6.4 (BLADE) ×2 IMPLANT
ELECT REM PT RETURN 9FT ADLT (ELECTROSURGICAL) ×2
ELECTRODE REM PT RTRN 9FT ADLT (ELECTROSURGICAL) ×1 IMPLANT
FACESHIELD WRAPAROUND (MASK) ×2 IMPLANT
GAUZE XEROFORM 5X9 LF (GAUZE/BANDAGES/DRESSINGS) ×2 IMPLANT
GLOVE BIO SURGEON STRL SZ7.5 (GLOVE) ×2 IMPLANT
GLOVE BIOGEL PI IND STRL 8 (GLOVE) ×1 IMPLANT
GLOVE BIOGEL PI INDICATOR 8 (GLOVE) ×1
GOWN STRL REUS W/ TWL LRG LVL3 (GOWN DISPOSABLE) ×2 IMPLANT
GOWN STRL REUS W/ TWL XL LVL3 (GOWN DISPOSABLE) ×1 IMPLANT
GOWN STRL REUS W/TWL LRG LVL3 (GOWN DISPOSABLE) ×2
GOWN STRL REUS W/TWL XL LVL3 (GOWN DISPOSABLE) ×1
GUIDE PIN 3.2X343 (PIN) ×1
GUIDE PIN 3.2X343MM (PIN) ×1
GUIDE ROD 3.0 (MISCELLANEOUS) ×2
KIT BASIN OR (CUSTOM PROCEDURE TRAY) ×2 IMPLANT
KIT ROOM TURNOVER OR (KITS) ×2 IMPLANT
LINER BOOT UNIVERSAL DISP (MISCELLANEOUS) ×2 IMPLANT
MANIFOLD NEPTUNE II (INSTRUMENTS) ×2 IMPLANT
NAIL TRIGEN INTERTAN 10S 340MM (Orthopedic Implant) ×2 IMPLANT
NS IRRIG 1000ML POUR BTL (IV SOLUTION) ×2 IMPLANT
PACK GENERAL/GYN (CUSTOM PROCEDURE TRAY) ×2 IMPLANT
PAD ARMBOARD 7.5X6 YLW CONV (MISCELLANEOUS) ×4 IMPLANT
PAD CAST 4YDX4 CTTN HI CHSV (CAST SUPPLIES) ×2 IMPLANT
PADDING CAST COTTON 4X4 STRL (CAST SUPPLIES) ×2
PIN GUIDE 3.2X343MM (PIN) ×1 IMPLANT
ROD GUIDE 3.0 (MISCELLANEOUS) ×1 IMPLANT
SCREW LAG COMPR KIT 80/75 (Screw) ×2 IMPLANT
STAPLER VISISTAT 35W (STAPLE) ×4 IMPLANT
SUT MON AB 2-0 CT1 36 (SUTURE) ×2 IMPLANT
SUT VIC AB 2-0 CT1 27 (SUTURE) ×1
SUT VIC AB 2-0 CT1 TAPERPNT 27 (SUTURE) ×1 IMPLANT
TOWEL OR 17X24 6PK STRL BLUE (TOWEL DISPOSABLE) ×2 IMPLANT
TOWEL OR 17X26 10 PK STRL BLUE (TOWEL DISPOSABLE) ×2 IMPLANT
WATER STERILE IRR 1000ML POUR (IV SOLUTION) ×2 IMPLANT

## 2016-03-05 NOTE — Op Note (Signed)
    Date of Surgery: 03/05/2016  INDICATIONS: [name] is a 75 y.o.-year-old female who sustained a left hip fracture. The risks and benefits of the procedure discussed with the patient prior to the procedure and all questions were answered; consent was obtained.  PREOPERATIVE DIAGNOSIS: left hip fracture   POSTOPERATIVE DIAGNOSIS: Same   PROCEDURE: Treatment of intertrochanteric, pertrochanteric, subtrochanteric fracture with intramedullary implant. CPT [phone]   SURGEON: Dannielle Karvonen. Stann Mainland, M.D.   ANESTHESIA: general   IV FLUIDS AND URINE: See anesthesia record   ESTIMATED BLOOD LOSS: 30 cc  IMPLANTS:  10 mm x 34cm intertan Smith and nephew implant 90m and 75 mm dual lag screws  DRAINS: None.   COMPLICATIONS: None.   DESCRIPTION OF PROCEDURE: The patient was brought to the operating room and placed supine on the operating table. The patient's leg had been signed prior to the procedure. The patient had the anesthesia placed by the anesthesiologist. The prep verification and incision time-outs were performed to confirm that this was the correct patient, site, side and location. The patient had an SCD on the opposite lower extremity. The patient did receive antibiotics prior to the incision and was re-dosed during the procedure as needed at indicated intervals. The patient was positioned on the fracture table with the table in traction and internal rotation to reduce the hip. The well leg was placed in a scissor position and all bony prominences were well-padded. The patient had the lower extremity prepped and draped in the standard surgical fashion. The incision was made 4 finger breadths superior to the greater trochanter. A guide pin was inserted into the tip of the greater trochanter under fluoroscopic guidance. An opening reamer was used to gain access to the femoral canal. The nail length was measured and inserted down the femoral canal to its proper depth. The appropriate version of  insertion for the lag screw was found under fluoroscopy. A pin was inserted up the femoral neck through the jig. Then, a second antirotation pin was inserted inferior to the first pin. The length of the lag screw was then measured. The lag screw was inserted as near to center-center in the head as possible. The antirotation pin was then taken out and an interdigitating compression screw was placed in its place. The leg was taken out of traction, then the interdigitating compression screw was used to compress across the fracture. Compression was visualized on serial xrays. The wound was copiously irrigated with saline and the subcutaneous layer closed with 2.0 vicryl and the skin was reapproximated with staples. The wounds were cleaned and dried a final time and a sterile dressing was placed. The hip was taken through a range of motion at the end of the case under fluoroscopic imaging to visualize the approach-withdraw phenomenon and confirm implant length in the head. The patient was then awakened from anesthesia and taken to the recovery room in stable condition. All counts were correct at the end of the case.   POSTOPERATIVE PLAN: The patient will be weight bearing as tolerated and will return in 2 weeks for staple removal and the patient will receive DVT prophylaxis based on other medications preferably lovenox, activity level, and risk ratio of bleeding to thrombosis.   JGeralynn Rile [id number] PM

## 2016-03-05 NOTE — Anesthesia Preprocedure Evaluation (Addendum)
Anesthesia Evaluation  Patient identified by MRN, date of birth, ID band Patient awake    Reviewed: Allergy & Precautions, H&P , NPO status , Patient's Chart, lab work & pertinent test results  Airway Mallampati: II  TM Distance: >3 FB Neck ROM: Full    Dental no notable dental hx. (+) Teeth Intact, Dental Advisory Given   Pulmonary neg pulmonary ROS, former smoker,    Pulmonary exam normal breath sounds clear to auscultation       Cardiovascular negative cardio ROS   Rhythm:Regular Rate:Normal     Neuro/Psych negative neurological ROS  negative psych ROS   GI/Hepatic negative GI ROS, Neg liver ROS,   Endo/Other  negative endocrine ROS  Renal/GU negative Renal ROS  negative genitourinary   Musculoskeletal  (+) Arthritis , Osteoarthritis,    Abdominal   Peds  Hematology negative hematology ROS (+) anemia ,   Anesthesia Other Findings   Reproductive/Obstetrics negative OB ROS                            Anesthesia Physical Anesthesia Plan  ASA: II  Anesthesia Plan: General   Post-op Pain Management:    Induction: Intravenous  Airway Management Planned: Oral ETT  Additional Equipment:   Intra-op Plan:   Post-operative Plan: Extubation in OR  Informed Consent: I have reviewed the patients History and Physical, chart, labs and discussed the procedure including the risks, benefits and alternatives for the proposed anesthesia with the patient or authorized representative who has indicated his/her understanding and acceptance.   Dental advisory given  Plan Discussed with: CRNA  Anesthesia Plan Comments:         Anesthesia Quick Evaluation

## 2016-03-05 NOTE — Anesthesia Procedure Notes (Signed)
Procedure Name: Intubation Date/Time: 03/05/2016 4:43 PM Performed by: Clearnce Sorrel Pre-anesthesia Checklist: Patient identified, Emergency Drugs available, Suction available, Patient being monitored and Timeout performed Patient Re-evaluated:Patient Re-evaluated prior to inductionOxygen Delivery Method: Circle system utilized Preoxygenation: Pre-oxygenation with 100% oxygen Intubation Type: IV induction Ventilation: Mask ventilation without difficulty Laryngoscope Size: Mac and 3 Grade View: Grade I Tube type: Oral Tube size: 6.5 mm Number of attempts: 1 Airway Equipment and Method: Stylet Placement Confirmation: ETT inserted through vocal cords under direct vision,  positive ETCO2 and breath sounds checked- equal and bilateral Secured at: 21 cm Tube secured with: Tape Dental Injury: Teeth and Oropharynx as per pre-operative assessment

## 2016-03-05 NOTE — ED Notes (Signed)
Attempted report x 2 

## 2016-03-05 NOTE — ED Triage Notes (Signed)
Pt brought in by EMS due to pt falling yesterday. Pt fell down the steps because she missed the step and hit her head and left hip. Pt denies any LOC or dizziness. Pt a&ox4.

## 2016-03-05 NOTE — ED Notes (Signed)
Pt refused to let this tech put her on the bedpan. Pt allowed husband to put her husband to put her halfway on the bedpan

## 2016-03-05 NOTE — Transfer of Care (Signed)
Immediate Anesthesia Transfer of Care Note  Patient: Kim [name]  Procedure(s) Performed: Procedure(s): INTRAMEDULLARY (IM) NAIL INTERTROCHANTRIC (Left)  Patient Location: PACU  Anesthesia Type:General  Level of Consciousness: awake and alert   Airway & Oxygen Therapy: Patient Spontanous Breathing and Patient connected to nasal cannula oxygen  Post-op Assessment: Report given to RN and Post -op Vital signs reviewed and stable  Post vital signs: Reviewed and stable  Last Vitals:  Vitals:   03/05/16 1500 03/05/16 1515  BP: 143/78 141/70  Pulse: 76 73  Resp: 15 15    Last Pain:  Vitals:   03/05/16 1520  PainSc: Asleep         Complications: No apparent anesthesia complications

## 2016-03-05 NOTE — Anesthesia Postprocedure Evaluation (Signed)
Anesthesia Post Note  Patient: Kim [name]  Procedure(s) Performed: Procedure(s) (LRB): INTRAMEDULLARY (IM) NAIL INTERTROCHANTRIC (Left)  Patient location during evaluation: PACU Anesthesia Type: General Level of consciousness: awake and alert Pain management: pain level controlled Vital Signs Assessment: post-procedure vital signs reviewed and stable Respiratory status: spontaneous breathing, nonlabored ventilation, respiratory function stable and patient connected to nasal cannula oxygen Cardiovascular status: blood pressure returned to baseline and stable Postop Assessment: no signs of nausea or vomiting Anesthetic complications: no    Last Vitals:  Vitals:   03/05/16 1930 03/05/16 1942  BP: (!) 144/90 (!) 159/75  Pulse: 89 87  Resp: 14 15  Temp:  36.5 C    Last Pain:  Vitals:   03/05/16 1942  PainSc: 8                  Diora Bellizzi,W. EDMOND

## 2016-03-05 NOTE — ED Notes (Signed)
Attempted report x1. 

## 2016-03-05 NOTE — ED Notes (Signed)
Pt put on 2L nasal cannula

## 2016-03-05 NOTE — H&P (Signed)
History and Physical    Kim [name] HWT:[id number] DOB: 20-Oct-1940 DOA: 03/05/2016   PCP: Mathews Argyle, MD   Patient coming from/Resides with: Private residence/this with husband  Admission status: Inpatient/medical floor -medically necessary to stay a minimum 2 midnights to rule out impending and/or unexpected changes in physiologic status that may differ from initial evaluation performed in the ER and/or at time of admission.   Chief Complaint: Fall with hip pain  HPI: Kim [name] is a 75 y.o. female with medical history significant for complicated Crohn's disease followed at [facility]/gastrology service; recent admission for bowel obstruction status post ileostomy, chronic protein calorie malnutrition, combination iron deficiency and anemia of chronic disease followed by Dr. Julien Nordmann with hematology. Patient presents with left hip pain and inability to bear weight on left leg after experiencing a mechanical fall on 9/29. Denied dizziness or other syncopal type symptoms prior to fall.  ED Course:  Vital Signs: BP 141/70   Pulse 73   Resp 15   Ht 4' 8"  (1.422 m)   SpO2 100%  PCXR: No acute abnormality; incidental finding of nodular density projected to the right of the heart on the frontal view which could represent artifact or confluence of shadows although a true nodule could not be excluded-radiologist recommends CT imaging for better evaluation DG femur: Proximal left intertrochanteric femoral fracture DG pelvis: Only displaced left intertrochanteric hip fracture CT had without contrast: No acute intercranial process  Review of Systems:  In addition to the HPI above,  No Fever-chills, myalgias or other constitutional symptoms No Headache, changes with Vision or hearing, new weakness, tingling, numbness in any extremity, dizziness, dysarthria or word finding difficulty, gait disturbance or imbalance, tremors or seizure activity No problems swallowing food or Liquids,  indigestion/reflux, choking or coughing while eating, abdominal pain with or after eating No Chest pain, Cough or Shortness of Breath, palpitations, orthopnea or DOE No Abdominal pain, N/V, melena,hematochezia, dark tarry stools No dysuria, malodorous urine, hematuria or flank pain No new skin rashes, lesions, masses or bruises No recent unintentional weight gain or loss No polyuria, polydypsia or polyphagia   Past Medical History:  Diagnosis Date  . Anemia    related to malabsorption--gets B12 shots and iron infusions, managed by Dr. Julien Nordmann  . BCC (basal cell carcinoma of skin)   . Crohn disease ([facility])    Dr. Emelda Fear at [facility]  . Glaucoma    Dr. Janyth Contes  . Intestinal malabsorption   . OA (osteoarthritis)    hands,back  . Osteoporosis    managed by Dr. Nevada Crane at [facility], [place] q2 yrs    Past Surgical History:  Procedure Laterality Date  . Crohn  2009  . internal ostomy pouch  1994  . TOTAL COLECTOMY  1984   due to Crohn's    Social History   Social History  . Marital status: Married    Spouse name: N/A  . Number of children: 2  . Years of education: N/A   Occupational History  . 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 Smoking status: Former Smoker    Quit date: 06/06/1972  . Smokeless tobacco: Never Used  . Alcohol use No  . Drug use: No  . Sexual activity: Not Currently   Other Topics Concern  . Not on file   Social History Narrative   Lives with her husband, no pets.  Children in [place] and [place].  Husband has Parkinsons Disease    Mobility: Without assistive  devices Work history: Not obtained   Allergies  Allergen Reactions  . Antihistamines, Chlorpheniramine-Type     Dry  Mouth, dry eyes-so dry she cannot even talk  . Metronidazole     Peripheral neuropathy  . Other Diarrhea and Nausea And Vomiting    Patient has an Ileostomy  . Sulfa Antibiotics Other (See Comments)    UNKNOWN  . Tape Other (See Comments)    SKIN WILL TEAR WITH  ANYTHING OTHER THAN PAPER TAPE OR COBAN WRAP    Family History  Problem Relation Age of Onset  . Diabetes Mother     borderline  . Heart disease Mother     MI  . Heart disease Father   . Thyroid disease Sister      Prior to Admission medications   Medication Sig Start Date End Date Taking? Authorizing Provider  acetaminophen (TYLENOL) 500 MG tablet Take 500 mg by mouth 2 (two) times daily.    Yes Historical Provider, MD  cholecalciferol (VITAMIN D) 1000 UNITS tablet Take 2,000 Units by mouth daily.    Yes Historical Provider, MD  dorzolamide-timolol (COSOPT) 22.3-6.8 MG/ML ophthalmic solution Place 1 drop into both eyes every 12 (twelve) hours.    Yes Historical Provider, MD  gabapentin (NEURONTIN) 300 MG capsule Take 300 mg by mouth 3 (three) times daily.  08/09/13  Yes Historical Provider, MD  HYDROcodone-acetaminophen (NORCO/VICODIN) 5-325 MG tablet Take 1 tablet by mouth every 6 (six) hours as needed for moderate pain.   Yes Historical Provider, MD  latanoprost (XALATAN) 0.005 % ophthalmic solution Place 1 drop into both eyes at bedtime.  03/07/12  Yes Historical Provider, MD  Melatonin 10 MG TBCR Take 10 mg by mouth at bedtime as needed for sleep.   Yes Historical Provider, MD  Multiple Vitamins-Minerals (ONE-A-DAY WOMENS 50+ ADVANTAGE) TABS Take 1 tablet by mouth daily.   Yes Historical Provider, MD  oxyCODONE (OXY IR/ROXICODONE) 5 MG immediate release tablet Take 5 mg by mouth every 6 (six) hours as needed (for pain).  02/04/16  Yes Historical Provider, MD  cyanocobalamin (,VITAMIN B-12,) 1000 MCG/ML injection INJECT 1 MILLILITER INTO MUSCLE EVERY 30 DAYS Patient not taking: Reported on 03/05/2016 05/01/15   Curt Bears, MD    Physical Exam: Vitals:   03/05/16 1415 03/05/16 1445 03/05/16 1500 03/05/16 1515  BP: 160/81 142/66 143/78 141/70  Pulse: 73 71 76 73  Resp: 17 16 15 15   SpO2: 100% 99% 97% 100%  Height:          Constitutional: NAD, calm, comfortable-Cachectic  appearing Eyes: PERRL, lids and conjunctivae normal ENMT: Mucous membranes are dry. Posterior pharynx clear of any exudate or lesions.Normal dentition.  Neck: normal, supple, no masses, no thyromegaly Respiratory: clear to auscultation bilaterally, no wheezing, no crackles. Normal respiratory effort. No accessory muscle use.  Cardiovascular: Regular rate and rhythm, no murmurs / rubs / gallops. No extremity edema. 2+ pedal pulses. No carotid bruits.  Abdomen: no tenderness, no masses palpated. No hepatosplenomegaly. Bowel sounds positive.  Musculoskeletal: no clubbing / cyanosis. No joint deformity upper extremities. Left lower extremity internally rotated and shortened Asian for significant pain with any attempt to manipulate or move this extremity. Good ROM, no contractures. Normal muscle tone.  Skin: no rashes, lesions, ulcers. No induration Neurologic: CN 2-12 grossly intact. Sensation intact, DTR normal. Strength 5/5 x all 4 extremities.  Psychiatric: Normal judgment and insight. Alert and oriented x 3. Normal mood.    Labs on Admission: I have personally reviewed following  labs and imaging studies  CBC:  Recent Labs Lab 03/05/16 1150  WBC 8.8  NEUTROABS 7.3  HGB 11.4*  HCT 36.1  MCV 98.6  PLT 114   Basic Metabolic Panel:  Recent Labs Lab 03/05/16 1150  NA 131*  K 3.6  CL 96*  CO2 26  GLUCOSE 97  BUN 21*  CREATININE 0.85  CALCIUM 9.2   GFR: CrCl cannot be calculated (Unknown ideal weight.). Liver Function Tests: No results for input(s): AST, ALT, ALKPHOS, BILITOT, PROT, ALBUMIN in the last 168 hours. No results for input(s): LIPASE, AMYLASE in the last 168 hours. No results for input(s): AMMONIA in the last 168 hours. Coagulation Profile:  Recent Labs Lab 03/05/16 1150  INR 1.09   Cardiac Enzymes: No results for input(s): CKTOTAL, CKMB, CKMBINDEX, TROPONINI in the last 168 hours. BNP (last 3 results) No results for input(s): PROBNP in the last 8760  hours. HbA1C: No results for input(s): HGBA1C in the last 72 hours. CBG: No results for input(s): GLUCAP in the last 168 hours. Lipid Profile: No results for input(s): CHOL, HDL, LDLCALC, TRIG, CHOLHDL, LDLDIRECT in the last 72 hours. Thyroid Function Tests: No results for input(s): TSH, T4TOTAL, FREET4, T3FREE, THYROIDAB in the last 72 hours. Anemia Panel: No results for input(s): VITAMINB12, FOLATE, FERRITIN, TIBC, IRON, RETICCTPCT in the last 72 hours. Urine analysis:    Component Value Date/Time   COLORURINE AMBER (A) 01/16/2016 1858   APPEARANCEUR CLEAR 01/16/2016 1858   LABSPEC 1.019 01/16/2016 1858   PHURINE 5.0 01/16/2016 1858   GLUCOSEU NEGATIVE 01/16/2016 1858   HGBUR NEGATIVE 01/16/2016 1858   BILIRUBINUR SMALL (A) 01/16/2016 1858   BILIRUBINUR neg 08/22/2011 1010   KETONESUR 15 (A) 01/16/2016 1858   PROTEINUR NEGATIVE 01/16/2016 1858   UROBILINOGEN negative 08/22/2011 1010   UROBILINOGEN 0.2 11/19/2007 0157   NITRITE NEGATIVE 01/16/2016 1858   LEUKOCYTESUR NEGATIVE 01/16/2016 1858   Sepsis Labs: @LABRCNTIP (procalcitonin:4,lacticidven:4) )No results found for this or any previous visit (from the past 240 hour(s)).   Radiological Exams on Admission: Dg Chest 1 View  Result Date: 03/05/2016 CLINICAL DATA:  Fall yesterday.  Pain. EXAM: CHEST 1 VIEW COMPARISON:  January 16, 2016 and November 19, 2007 FINDINGS: The heart, hila, and mediastinum are normal. No pneumothorax. No mass. There is a nodular density just to the right of the heart on the frontal view. This was not seen on previous studies. No other nodules. No focal infiltrate. IMPRESSION: Nodular density projected to the right of the heart on the frontal view. While this may represent artifact or confluence of shadows, a true nodule is not excluded. Recommend CT imaging for better evaluation. No acute abnormality. Electronically Signed   By: Dorise Bullion III M.D   On: 03/05/2016 12:55   Dg Pelvis 1-2 Views  Result  Date: 03/05/2016 CLINICAL DATA:  Pain after fall EXAM: PELVIS - 1-2 VIEW COMPARISON:  None. FINDINGS: There is a mildly displaced left intertrochanteric hip fracture. No other bony abnormalities. IMPRESSION: Mildly displaced left intertrochanteric hip fracture. Electronically Signed   By: Dorise Bullion III M.D   On: 03/05/2016 12:57   Ct Head Wo Contrast  Result Date: 03/05/2016 CLINICAL DATA:  Patient fell down steps and hit head. EXAM: CT HEAD WITHOUT CONTRAST TECHNIQUE: Contiguous axial images were obtained from the base of the skull through the vertex without intravenous contrast. COMPARISON:  None. FINDINGS: Brain: Soft tissue swelling/ hematoma seen over the left posterior scalp. No underlying fracture identified. No subdural, epidural, or subarachnoid  hemorrhage. The cerebellum, brainstem, and basal cisterns are normal. No acute cortical ischemia or infarct no mass, mass effect, or midline shift. Ventricles and sulci are normal for age. Vascular: Calcified atherosclerosis associated with the intracranial portions of the carotid arteries. Skull: No acute fractures are seen Sinuses/Orbits: There is a tiny amount of fluid in right maxillary sinus. No associated fracture. The paranasal sinuses, mastoid air cells, and middle ears are otherwise normal. Other: No other abnormalities. IMPRESSION: No acute intracranial process. Electronically Signed   By: Dorise Bullion III M.D   On: 03/05/2016 13:30   Dg Femur Min 2 Views Left  Result Date: 03/05/2016 CLINICAL DATA:  Pain after fall EXAM: LEFT FEMUR 2 VIEWS COMPARISON:  None. FINDINGS: There is an intertrochanteric fracture through the proximal left femur. No dislocation. No other fractures identified. IMPRESSION: Proximal left intertrochanteric femoral fracture. Electronically Signed   By: Dorise Bullion III M.D   On: 03/05/2016 12:56     Assessment/Plan Principal Problem:   Hip fracture  -Patient presents with left intertrochanteric hip fracture  after mechanical fall mouth inability to bear weight and significant pain -Orthopedic surgery has been consulted but EDP/Dr. Stann Mainland with [facility] -NPO until timing of surgery to clear-IV fluids while NPO -IV fentanyl for pain since patient previously on narcotics and is not narcotic nave -Routine hip fracture order set initiated -Anticipate we'll need rehabilitative therapies pending PT/OT evaluation postoperatively; patient and husband report that they will be moving to an older adult community at the end of this month and they think that they have services available that would be accommodating to rehabilitative therapies at that facility -Preop EKG  Active Problems:   Acute hyponatremia -Suspect related to volume depletion which is mild -Follow labs    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 -Patient reports underwent creation of ileostomy in August of this year after admission for bowel obstruction; procedure was done at [place] she has a variety of output from her stoma: 6/mushy versus watery with no particular pattern -No longer on medications for Crohn's although states her primary gastroenterologist is planning on starting her on Stelara; not on steroids as well -Currently no abdominal pain    Protein calorie malnutrition  -Patient reports that she does not like protein supplementation shakes    Lung nodule -Incidental finding on plain x-ray with radiologist recommending CT scan to better clarify    Iron deficiency anemia/Anemia of chronic disease -Hemoglobin stable at 11 -Followed as an outpatient by Dr. Julien Nordmann with hematology    Peripheral neuropathy -Patient reports she developed after taking medication for Crohn's disease    DVT prophylaxis: Lovenox Code Status: Full  Family Communication: Husband at bedside Disposition Plan: Anticipate discharge back to previous home environment pending postoperative PT/OT evaluation-may  require inpatient rehabilitative treatment before can return back to home environment Consults called: Orthopedic surgeon/[place]    ELLIS,ALLISON L. ANP-BC [facility] Pager [phone]   If 7PM-7AM, please contact night-coverage [email or web address] Password [email or web address]  03/05/2016, 3:24 PM

## 2016-03-05 NOTE — Consult Note (Signed)
ORTHOPAEDIC CONSULTATION  REQUESTING PHYSICIAN: Roxan Hockey, MD  PCP:  Mathews Argyle, MD  Chief Complaint: Left hip pain  HPI: Kim [name] is a 75 y.o. female who complains of  Left hip pain and inability to move the leg 2/2 pain after a mechanical fall from 1 step last night.  She presents to our ED for evaluation and is found to have a left IT hip fx that is indicated for operative management.  She denies new paresthesia, but does have peripheral neuropathy as side effect from her prior treatments for Crohn's disease.  Past Medical History:  Diagnosis Date  . Anemia    related to malabsorption--gets B12 shots and iron infusions, managed by Dr. Julien Nordmann  . BCC (basal cell carcinoma of skin)   . Crohn disease ([place])    Dr. Emelda Fear at [facility]  . Glaucoma    Dr. Janyth Contes  . Intestinal malabsorption   . OA (osteoarthritis)    hands,back  . Osteoporosis    managed by Dr. Nevada Crane at [facility], [place] q2 yrs   Past Surgical History:  Procedure Laterality Date  . Crohn  2009  . internal ostomy pouch  1994  . TOTAL COLECTOMY  1984   due to Crohn's   Social History   Social History  . Marital status: Married    Spouse name: N/A  . Number of children: 2  . Years of education: N/A   Occupational History  . 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 Smoking status: Former Smoker    Quit date: 06/06/1972  . Smokeless tobacco: Never Used  . Alcohol use No  . Drug use: No  . Sexual activity: Not Currently   Other Topics Concern  . None   Social History Narrative   Lives with her husband, no pets.  Children in [place] and [place].  Husband has Parkinsons Disease   Family History  Problem Relation Age of Onset  . Diabetes Mother     borderline  . Heart disease Mother     MI  . Heart disease Father   . Thyroid disease Sister    Allergies  Allergen Reactions  . Antihistamines, Chlorpheniramine-Type     Dry  Mouth, dry eyes-so dry she cannot even talk    . Metronidazole     Peripheral neuropathy  . Other Diarrhea and Nausea And Vomiting    Patient has an Ileostomy  . Sulfa Antibiotics Other (See Comments)    UNKNOWN  . Tape Other (See Comments)    SKIN WILL TEAR WITH ANYTHING OTHER THAN PAPER TAPE OR COBAN WRAP   Prior to Admission medications   Medication Sig Start Date End Date Taking? Authorizing Provider  acetaminophen (TYLENOL) 500 MG tablet Take 500 mg by mouth 2 (two) times daily.    Yes Historical Provider, MD  cholecalciferol (VITAMIN D) 1000 UNITS tablet Take 2,000 Units by mouth daily.    Yes Historical Provider, MD  dorzolamide-timolol (COSOPT) 22.3-6.8 MG/ML ophthalmic solution Place 1 drop into both eyes every 12 (twelve) hours.    Yes Historical Provider, MD  gabapentin (NEURONTIN) 300 MG capsule Take 300 mg by mouth 3 (three) times daily.  08/09/13  Yes Historical Provider, MD  HYDROcodone-acetaminophen (NORCO/VICODIN) 5-325 MG tablet Take 1 tablet by mouth every 6 (six) hours as needed for moderate pain.   Yes Historical Provider, MD  latanoprost (XALATAN) 0.005 % ophthalmic solution Place 1 drop into both eyes at bedtime.  03/07/12  Yes Historical  Provider, MD  Melatonin 10 MG TBCR Take 10 mg by mouth at bedtime as needed for sleep.   Yes Historical Provider, MD  Multiple Vitamins-Minerals (ONE-A-DAY WOMENS 50+ ADVANTAGE) TABS Take 1 tablet by mouth daily.   Yes Historical Provider, MD  oxyCODONE (OXY IR/ROXICODONE) 5 MG immediate release tablet Take 5 mg by mouth every 6 (six) hours as needed (for pain).  02/04/16  Yes Historical Provider, MD  cyanocobalamin (,VITAMIN B-12,) 1000 MCG/ML injection INJECT 1 MILLILITER INTO MUSCLE EVERY 30 DAYS Patient not taking: Reported on 03/05/2016 05/01/15   Curt Bears, MD   Dg Chest 1 View  Result Date: 03/05/2016 CLINICAL DATA:  Fall yesterday.  Pain. EXAM: CHEST 1 VIEW COMPARISON:  January 16, 2016 and November 19, 2007 FINDINGS: The heart, hila, and mediastinum are normal. No  pneumothorax. No mass. There is a nodular density just to the right of the heart on the frontal view. This was not seen on previous studies. No other nodules. No focal infiltrate. IMPRESSION: Nodular density projected to the right of the heart on the frontal view. While this may represent artifact or confluence of shadows, a true nodule is not excluded. Recommend CT imaging for better evaluation. No acute abnormality. Electronically Signed   By: Dorise Bullion III M.D   On: 03/05/2016 12:55   Dg Pelvis 1-2 Views  Result Date: 03/05/2016 CLINICAL DATA:  Pain after fall EXAM: PELVIS - 1-2 VIEW COMPARISON:  None. FINDINGS: There is a mildly displaced left intertrochanteric hip fracture. No other bony abnormalities. IMPRESSION: Mildly displaced left intertrochanteric hip fracture. Electronically Signed   By: Dorise Bullion III M.D   On: 03/05/2016 12:57   Ct Head Wo Contrast  Result Date: 03/05/2016 CLINICAL DATA:  Patient fell down steps and hit head. EXAM: CT HEAD WITHOUT CONTRAST TECHNIQUE: Contiguous axial images were obtained from the base of the skull through the vertex without intravenous contrast. COMPARISON:  None. FINDINGS: Brain: Soft tissue swelling/ hematoma seen over the left posterior scalp. No underlying fracture identified. No subdural, epidural, or subarachnoid hemorrhage. The cerebellum, brainstem, and basal cisterns are normal. No acute cortical ischemia or infarct no mass, mass effect, or midline shift. Ventricles and sulci are normal for age. Vascular: Calcified atherosclerosis associated with the intracranial portions of the carotid arteries. Skull: No acute fractures are seen Sinuses/Orbits: There is a tiny amount of fluid in right maxillary sinus. No associated fracture. The paranasal sinuses, mastoid air cells, and middle ears are otherwise normal. Other: No other abnormalities. IMPRESSION: No acute intracranial process. Electronically Signed   By: Dorise Bullion III M.D   On:  03/05/2016 13:30   Dg Femur Min 2 Views Left  Result Date: 03/05/2016 CLINICAL DATA:  Pain after fall EXAM: LEFT FEMUR 2 VIEWS COMPARISON:  None. FINDINGS: There is an intertrochanteric fracture through the proximal left femur. No dislocation. No other fractures identified. IMPRESSION: Proximal left intertrochanteric femoral fracture. Electronically Signed   By: Dorise Bullion III M.D   On: 03/05/2016 12:56    Positive ROS: All other systems have been reviewed and were otherwise negative with the exception of those mentioned in the HPI and as above.  Physical Exam: General: Alert, no acute distress Cardiovascular: No pedal edema Respiratory: No cyanosis, no use of accessory musculature GI: No organomegaly, abdomen is soft and non-tender Skin: No lesions in the area of chief complaint Neurologic: Sensation intact distally Psychiatric: Patient is competent for consent with normal mood and affect Lymphatic: No axillary or cervical lymphadenopathy  MUSCULOSKELETAL: LLE -TTP along GT , no ecchymosis or open wounds, otherwise 2+DP pulse, +FHL/EHL/GSC/TA, SILT DP/SP/SUR/SAPH  Assessment: 1. Left hip intertrochanteric hip fracture  Plan: -plan for operative fixation today -NPO -The risks, benefits, and alternatives were discussed with the patient. There are risks associated with the surgery including, but not limited to, problems with anesthesia (death), infection, differences in leg length/angulation/rotation, fracture of bones, loosening or failure of implants, malunion, nonunion, hematoma (blood accumulation) which may require surgical drainage, blood clots, pulmonary embolism, nerve injury (foot drop), and blood vessel injury. The patient understands these risks and elects to proceed. -thank you for this consult   Nicholes Stairs, MD Cell [phone]    03/05/2016 4:18 PM

## 2016-03-05 NOTE — ED Provider Notes (Signed)
[facility] DEPT Provider Note   CSN: [id number] Arrival date & time: 03/05/16  1119     History   Chief Complaint Chief Complaint  Patient presents with  . Fall    HPI Kim [name] is a 75 y.o. female.  Pt fell at home yesterday after a fall.  She said that she was walking down the step and hit her head and her left hip.  She required help to get into her house and was put on the couch.  She was still unable to put any weight on her hip this morning.  Pt also sustained a hematoma and a small laceration to the left side of her head.  She had no loc.  She denies any dizziness.      Past Medical History:  Diagnosis Date  . Anemia    related to malabsorption--gets B12 shots and iron infusions, managed by Dr. Julien Nordmann  . BCC (basal cell carcinoma of skin)   . Crohn disease ([place])    Dr. Emelda Fear at [facility]  . Glaucoma    Dr. Janyth Contes  . Intestinal malabsorption   . OA (osteoarthritis)    hands,back  . Osteoporosis    managed by Dr. Nevada Crane at [facility], [place] q2 yrs    Patient Active Problem List   Diagnosis Date Noted  . Hip fracture ([place]) 03/05/2016  . Osteoporosis 03/18/2013  . Glaucoma 03/18/2013  . 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 ([place])   . OA (osteoarthritis)   . Anemia of chronic disease 06/07/2012  . Iron deficiency anemia 05/10/2011    Past Surgical History:  Procedure Laterality Date  . Crohn  2009  . internal ostomy pouch  1994  . TOTAL COLECTOMY  1984   due to Crohn's    OB History    No data available       Home Medications    Prior to Admission medications   Medication Sig Start Date End Date Taking? Authorizing Provider  acetaminophen (TYLENOL) 500 MG tablet Take 500 mg by mouth 2 (two) times daily.    Yes Historical Provider, MD  cholecalciferol (VITAMIN D) 1000 UNITS tablet Take 2,000 Units by mouth daily.    Yes Historical Provider, MD  dorzolamide-timolol (COSOPT) 22.3-6.8 MG/ML ophthalmic solution Place 1 drop  into both eyes every 12 (twelve) hours.    Yes Historical Provider, MD  gabapentin (NEURONTIN) 300 MG capsule Take 300 mg by mouth 3 (three) times daily.  08/09/13  Yes Historical Provider, MD  HYDROcodone-acetaminophen (NORCO/VICODIN) 5-325 MG tablet Take 1 tablet by mouth every 6 (six) hours as needed for moderate pain.   Yes Historical Provider, MD  latanoprost (XALATAN) 0.005 % ophthalmic solution Place 1 drop into both eyes at bedtime.  03/07/12  Yes Historical Provider, MD  Melatonin 10 MG TBCR Take 10 mg by mouth at bedtime as needed for sleep.   Yes Historical Provider, MD  Multiple Vitamins-Minerals (ONE-A-DAY WOMENS 50+ ADVANTAGE) TABS Take 1 tablet by mouth daily.   Yes Historical Provider, MD  oxyCODONE (OXY IR/ROXICODONE) 5 MG immediate release tablet Take 5 mg by mouth every 6 (six) hours as needed (for pain).  02/04/16  Yes Historical Provider, MD  cyanocobalamin (,VITAMIN B-12,) 1000 MCG/ML injection INJECT 1 MILLILITER INTO MUSCLE EVERY 30 DAYS Patient not taking: Reported on 03/05/2016 05/01/15   Curt Bears, MD    Family History Family History  Problem Relation Age of Onset  . Diabetes Mother     borderline  .  Heart disease Mother     MI  . Heart disease Father   . Thyroid disease Sister     Social History Social History  Substance Use Topics  . Smoking status: Former Smoker    Quit date: 06/06/1972  . Smokeless tobacco: Never Used  . Alcohol use No     Allergies   Antihistamines, chlorpheniramine-type; Metronidazole; Other; Sulfa antibiotics; and Tape   Review of Systems Review of Systems  Musculoskeletal:       Left hip pain  All other systems reviewed and are negative.    Physical Exam Updated Vital Signs BP 143/78   Pulse 76   Resp 15   Ht 4' 8"  (1.422 m)   SpO2 97%   Physical Exam  Constitutional: She is oriented to person, place, and time. She appears well-developed and well-nourished.  HENT:  Head: Normocephalic.    Right Ear: External  ear normal.  Left Ear: External ear normal.  Nose: Nose normal.  Mouth/Throat: Oropharynx is clear and moist.  Eyes: Conjunctivae and EOM are normal. Pupils are equal, round, and reactive to light.  Neck: Normal range of motion. Neck supple.  Cardiovascular: Normal rate, regular rhythm, normal heart sounds and intact distal pulses.   Pulmonary/Chest: Effort normal and breath sounds normal.  Abdominal: Soft. Bowel sounds are normal.  Musculoskeletal:  Left hip tenderness  Neurological: She is alert and oriented to person, place, and time.  Skin: Skin is warm and dry.  Psychiatric: She has a normal mood and affect. Her behavior is normal. Judgment and thought content normal.  Nursing note and vitals reviewed.    ED Treatments / Results  Labs (all labs ordered are listed, but only abnormal results are displayed) Labs Reviewed  BASIC METABOLIC PANEL - Abnormal; Notable for the following:       Result Value   Sodium 131 (*)    Chloride 96 (*)    BUN 21 (*)    All other components within normal limits  CBC WITH DIFFERENTIAL/PLATELET - Abnormal; Notable for the following:    RBC 3.66 (*)    Hemoglobin 11.4 (*)    RDW 16.5 (*)    All other components within normal limits  PROTIME-INR  URINALYSIS, ROUTINE W REFLEX MICROSCOPIC (NOT AT [facility])  TYPE AND SCREEN    EKG  EKG Interpretation None       Radiology Dg Chest 1 View  Result Date: 03/05/2016 CLINICAL DATA:  Fall yesterday.  Pain. EXAM: CHEST 1 VIEW COMPARISON:  January 16, 2016 and November 19, 2007 FINDINGS: The heart, hila, and mediastinum are normal. No pneumothorax. No mass. There is a nodular density just to the right of the heart on the frontal view. This was not seen on previous studies. No other nodules. No focal infiltrate. IMPRESSION: Nodular density projected to the right of the heart on the frontal view. While this may represent artifact or confluence of shadows, a true nodule is not excluded. Recommend CT imaging for  better evaluation. No acute abnormality. Electronically Signed   By: Dorise Bullion III M.D   On: 03/05/2016 12:55   Dg Pelvis 1-2 Views  Result Date: 03/05/2016 CLINICAL DATA:  Pain after fall EXAM: PELVIS - 1-2 VIEW COMPARISON:  None. FINDINGS: There is a mildly displaced left intertrochanteric hip fracture. No other bony abnormalities. IMPRESSION: Mildly displaced left intertrochanteric hip fracture. Electronically Signed   By: Dorise Bullion III M.D   On: 03/05/2016 12:57   Ct Head Wo Contrast  Result  Date: 03/05/2016 CLINICAL DATA:  Patient fell down steps and hit head. EXAM: CT HEAD WITHOUT CONTRAST TECHNIQUE: Contiguous axial images were obtained from the base of the skull through the vertex without intravenous contrast. COMPARISON:  None. FINDINGS: Brain: Soft tissue swelling/ hematoma seen over the left posterior scalp. No underlying fracture identified. No subdural, epidural, or subarachnoid hemorrhage. The cerebellum, brainstem, and basal cisterns are normal. No acute cortical ischemia or infarct no mass, mass effect, or midline shift. Ventricles and sulci are normal for age. Vascular: Calcified atherosclerosis associated with the intracranial portions of the carotid arteries. Skull: No acute fractures are seen Sinuses/Orbits: There is a tiny amount of fluid in right maxillary sinus. No associated fracture. The paranasal sinuses, mastoid air cells, and middle ears are otherwise normal. Other: No other abnormalities. IMPRESSION: No acute intracranial process. Electronically Signed   By: Dorise Bullion III M.D   On: 03/05/2016 13:30   Dg Femur Min 2 Views Left  Result Date: 03/05/2016 CLINICAL DATA:  Pain after fall EXAM: LEFT FEMUR 2 VIEWS COMPARISON:  None. FINDINGS: There is an intertrochanteric fracture through the proximal left femur. No dislocation. No other fractures identified. IMPRESSION: Proximal left intertrochanteric femoral fracture. Electronically Signed   By: Dorise Bullion  III M.D   On: 03/05/2016 12:56    Procedures Procedures (including critical care time)  Medications Ordered in ED Medications  fentaNYL (SUBLIMAZE) injection 50 mcg (50 mcg Intravenous Given 03/05/16 1430)  ondansetron (ZOFRAN) injection 4 mg (not administered)  0.9 %  sodium chloride infusion ( Intravenous Hold 03/05/16 1429)  fentaNYL (SUBLIMAZE) injection 50 mcg (50 mcg Intravenous Given 03/05/16 1255)     Initial Impression / Assessment and Plan / ED Course  I have reviewed the triage vital signs and the nursing notes.  Pertinent labs & imaging results that were available during my care of the patient were reviewed by me and considered in my medical decision making (see chart for details).  Clinical Course   Pt d/w [facility] hospitalists for admission.  Pt also d/w Dr. Stann Mainland (ortho) who requested NPO until he can see her.    Final Clinical Impressions(s) / ED Diagnoses   Final diagnoses:  Closed displaced intertrochanteric fracture of left femur, initial encounter [facility])  Fall, initial encounter    New Prescriptions New Prescriptions   No medications on file     Isla Pence, MD 03/05/16 1506

## 2016-03-06 DIAGNOSIS — R911 Solitary pulmonary nodule: Secondary | ICD-10-CM

## 2016-03-06 LAB — URINE MICROSCOPIC-ADD ON

## 2016-03-06 LAB — CBC
HEMATOCRIT: 27.9 % — AB (ref 36.0–46.0)
HEMOGLOBIN: 9 g/dL — AB (ref 12.0–15.0)
MCH: 31.5 pg (ref 26.0–34.0)
MCHC: 32.3 g/dL (ref 30.0–36.0)
MCV: 97.6 fL (ref 78.0–100.0)
Platelets: 125 10*3/uL — ABNORMAL LOW (ref 150–400)
RBC: 2.86 MIL/uL — AB (ref 3.87–5.11)
RDW: 16.3 % — ABNORMAL HIGH (ref 11.5–15.5)
WBC: 9 10*3/uL (ref 4.0–10.5)

## 2016-03-06 LAB — BASIC METABOLIC PANEL
Anion gap: 10 (ref 5–15)
BUN: 21 mg/dL — AB (ref 6–20)
CHLORIDE: 101 mmol/L (ref 101–111)
CO2: 25 mmol/L (ref 22–32)
CREATININE: 1.03 mg/dL — AB (ref 0.44–1.00)
Calcium: 8 mg/dL — ABNORMAL LOW (ref 8.9–10.3)
GFR calc Af Amer: >60 mL/min (ref >60)
GFR calc non Af Amer: 52 mL/min — ABNORMAL LOW (ref >60)
GLUCOSE: 96 mg/dL (ref 65–99)
POTASSIUM: 3.8 mmol/L (ref 3.5–5.1)
Sodium: 136 mmol/L (ref 135–145)

## 2016-03-06 LAB — URINALYSIS, ROUTINE W REFLEX MICROSCOPIC
BILIRUBIN URINE: NEGATIVE
GLUCOSE, UA: NEGATIVE mg/dL
HGB URINE DIPSTICK: NEGATIVE
KETONES UR: NEGATIVE mg/dL
Nitrite: NEGATIVE
PH: 5 (ref 5.0–8.0)
Protein, ur: NEGATIVE mg/dL
Specific Gravity, Urine: 1.025 (ref 1.005–1.030)

## 2016-03-06 MED ORDER — HYDROMORPHONE HCL 1 MG/ML IJ SOLN
1.0000 mg | INTRAMUSCULAR | Status: DC | PRN
  Administered 2016-03-06 – 2016-03-08 (×5): 1 mg via INTRAVENOUS
  Filled 2016-03-06 (×5): qty 1

## 2016-03-06 MED ORDER — METHOCARBAMOL 500 MG PO TABS
500.0000 mg | ORAL_TABLET | Freq: Three times a day (TID) | ORAL | Status: DC | PRN
  Administered 2016-03-06 – 2016-03-13 (×11): 500 mg via ORAL
  Filled 2016-03-06 (×11): qty 1

## 2016-03-06 MED ORDER — METHOCARBAMOL 1000 MG/10ML IJ SOLN
500.0000 mg | Freq: Three times a day (TID) | INTRAVENOUS | Status: DC | PRN
  Filled 2016-03-06: qty 5

## 2016-03-06 NOTE — Progress Notes (Signed)
Kim [name]  MRN: [id number] DOB/Age: Jan 18, 1941 75 y.o. Physician: Stann Mainland Procedure: Procedure(s) (LRB): INTRAMEDULLARY (IM) NAIL INTERTROCHANTRIC (Left)     Subjective: Had a very rough night, very painful. Multiple complaints this am.   Vital Signs Temp:  [97.7 F (36.5 C)-100.6 F (38.1 C)] 98.1 F (36.7 C) (10/01 0800) Pulse Rate:  [70-102] 94 (10/01 0800) Resp:  [12-18] 16 (10/01 0656) BP: (122-171)/(55-90) 125/55 (10/01 0800) SpO2:  [93 %-100 %] 100 % (10/01 0800)  Lab Results  Recent Labs  03/05/16 1150 03/06/16 0703  WBC 8.8 9.0  HGB 11.4* 9.0*  HCT 36.1 27.9*  PLT 155 125*   BMET  Recent Labs  03/05/16 1150 03/06/16 0703  NA 131* 136  K 3.6 3.8  CL 96* 101  CO2 26 25  GLUCOSE 97 96  BUN 21* 21*  CREATININE 0.85 1.03*  CALCIUM 9.2 8.0*   INR  Date Value Ref Range Status  03/05/2016 1.09  Final     Exam Left hip and thigh soft, holds very guarded and allows very limited exam        Plan Long discussion with patient to try to find a better pain control regimen, and will change to dilaudid Mobilize today with PT however patient stated she is likely to refuse getting out of bed Hgb 9 but with history of crohns and malabsorption issues and with hip fracture this is not surprising. Will monitor  [facility] PA-C   03/06/2016, 8:42 AM Contact # [phone]

## 2016-03-06 NOTE — Progress Notes (Signed)
Patient stating to RN and NT that she does not want anyone to touch her. Patient states that she does not want an in and out cath if unable to void. Patient refusing care. Nursing will continue to monitor.

## 2016-03-06 NOTE — Progress Notes (Signed)
PROGRESS NOTE  Kim [name]  ZOX:[id number] DOB: 24-Jan-1941 DOA: 03/05/2016 PCP: Mathews Argyle, MD Outpatient Specialists:  Subjective: Seen with Dr. Stann Mainland this morning, patient was very frustrated. We had a very prolonged discussion. "I think I am not getting a good care", patient reported pain control did not meet her expectations. She also reported she was hot last night and she did not expect this much pain after surgery because supposed to be "fixed". "I am not an addict, but I want my pain to be controlled" patient reported. He is on Norco, Neurontin and OxyIR at home for pain control, will switch pain medications to OxyIR and Dilaudid. All her questions answered either by me or Dr. Stann Mainland to her satisfaction.  Brief Narrative:  Kim [name] is a 75 y.o. female with medical history significant for complicated Crohn's disease followed at [facility]/gastrology service; recent admission for bowel obstruction status post ileostomy, chronic protein calorie malnutrition, combination iron deficiency and anemia of chronic disease followed by Dr. Julien Nordmann with hematology. Patient presents with left hip pain and inability to bear weight on left leg after experiencing a mechanical fall on 9/29. Denied dizziness or other syncopal type symptoms prior to fall.  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 -Status post ORIF with intramedullary intertrochanteric nail done by Dr. Stann Mainland on 03/05/16. -DVT prophylaxis: with Lovenox. -Pain control: with opioids, patient has difficulty controlling her pain with previous regimen, opioids switched to Dilaudid and  OxyIR.  Acute kidney injury -Creatinine is 1.03, baseline is 0.8, continue IV fluids, check BMP in a.m.  Acute hyponatremia -Mild, likely related to volume depletion, this is resolved.  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 -Patient reports underwent creation of ileostomy in August of this year after admission for bowel obstruction; procedure was done at [place] she has a variety of output from her stoma: 6/mushy versus watery with no particular pattern -No longer on medications for Crohn's although states her primary gastroenterologist is planning on starting her on Stelara; not on steroids as well -Currently no abdominal pain  Protein calorie malnutrition  -Patient reports that she does not like protein supplementation shakes  Lung nodule -Incidental finding on plain x-ray with radiologist recommending CT scan to better clarify  Iron deficiency anemia/Anemia of chronic disease -Hemoglobin was 11.4 admission, hemoglobin currently 9.0, follow closely   Peripheral neuropathy -Patient reports she developed after taking medication for Crohn's disease  DVT prophylaxis: Lovenox Code Status: Full Code Family Communication:  Disposition Plan:  Diet: Diet regular Room service appropriate? Yes; Fluid consistency: Thin  Consultants:   Stann Mainland, ortho  Procedures:   Intramedullary nail  Antimicrobials:   Perioperative antibiotics   Objective: Vitals:   03/05/16 2220 03/06/16 0147 03/06/16 0656 03/06/16 0800  BP: 122/65 122/66 (!) 128/59 (!) 125/55  Pulse: (!) 102 (!) 102 86 94  Resp: 18 18 16    Temp: 100 F (37.8 C) (!) 100.6 F (38.1 C) 99.1 F (37.3 C) 98.1 F (36.7 C)  TempSrc: Oral Oral Oral Oral  SpO2: 100%  100% 100%  Height:        Intake/Output Summary (Last 24 hours)  at 03/06/16 1008 Last data filed at 03/06/16 0524  Gross per 24 hour  Intake           686.25 ml  Output              150 ml  Net           536.25 ml    There were no vitals filed for this visit.  Examination: General exam: Appears calm and comfortable  Respiratory system: Clear to auscultation. Respiratory effort normal. Cardiovascular system: S1 & S2 heard, RRR. No JVD, murmurs, rubs, gallops or clicks. No pedal edema. Gastrointestinal system: Abdomen is nondistended, soft and nontender. No organomegaly or masses felt. Normal bowel sounds heard. Central nervous system: Alert and oriented. No focal neurological deficits. Extremities: Symmetric 5 x 5 power. Skin: No rashes, lesions or ulcers Psychiatry: Judgement and insight appear normal. Mood & affect appropriate.   Data Reviewed: I have personally reviewed following labs and imaging studies  CBC:  Recent Labs Lab 03/05/16 1150 03/06/16 0703  WBC 8.8 9.0  NEUTROABS 7.3  --   HGB 11.4* 9.0*  HCT 36.1 27.9*  MCV 98.6 97.6  PLT 155 433*   Basic Metabolic Panel:  Recent Labs Lab 03/05/16 1150 03/06/16 0703  NA 131* 136  K 3.6 3.8  CL 96* 101  CO2 26 25  GLUCOSE 97 96  BUN 21* 21*  CREATININE 0.85 1.03*  CALCIUM 9.2 8.0*   GFR: CrCl cannot be calculated (Unknown ideal weight.). Liver Function Tests: No results for input(s): AST, ALT, ALKPHOS, BILITOT, PROT, ALBUMIN in the last 168 hours. No results for input(s): LIPASE, AMYLASE in the last 168 hours. No results for input(s): AMMONIA in the last 168 hours. Coagulation Profile:  Recent Labs Lab 03/05/16 1150  INR 1.09   Cardiac Enzymes: No results for input(s): CKTOTAL, CKMB, CKMBINDEX, TROPONINI in the last 168 hours. BNP (last 3 results) No results for input(s): PROBNP in the last 8760 hours. HbA1C: No results for input(s): HGBA1C in the last 72 hours. CBG: No results for input(s): GLUCAP in the last 168 hours. Lipid Profile: No results for input(s): CHOL, HDL, LDLCALC, TRIG, CHOLHDL, LDLDIRECT in the last 72 hours. Thyroid Function Tests: No results for input(s): TSH, T4TOTAL, FREET4, T3FREE,  THYROIDAB in the last 72 hours. Anemia Panel: No results for input(s): VITAMINB12, FOLATE, FERRITIN, TIBC, IRON, RETICCTPCT in the last 72 hours. Urine analysis:    Component Value Date/Time   COLORURINE AMBER (A) 01/16/2016 1858   APPEARANCEUR CLEAR 01/16/2016 1858   LABSPEC 1.019 01/16/2016 1858   PHURINE 5.0 01/16/2016 1858   GLUCOSEU NEGATIVE 01/16/2016 1858   HGBUR NEGATIVE 01/16/2016 1858   BILIRUBINUR SMALL (A) 01/16/2016 1858   BILIRUBINUR neg 08/22/2011 1010   KETONESUR 15 (A) 01/16/2016 1858   PROTEINUR NEGATIVE 01/16/2016 1858   UROBILINOGEN negative 08/22/2011 1010   UROBILINOGEN 0.2 11/19/2007 0157   NITRITE NEGATIVE 01/16/2016 1858   LEUKOCYTESUR NEGATIVE 01/16/2016 1858   Sepsis Labs: @LABRCNTIP (procalcitonin:4,lacticidven:4)  )No results found for this or any previous visit (from the past 240 hour(s)).   Invalid input(s): PROCALCITONIN, LACTICACIDVEN   Radiology Studies: Dg Chest 1 View  Result Date: 03/05/2016 CLINICAL DATA:  Fall yesterday.  Pain. EXAM: CHEST 1 VIEW COMPARISON:  January 16, 2016 and November 19, 2007 FINDINGS: The heart, hila, and mediastinum are normal. No pneumothorax. No mass. There is a nodular density just to the right of the heart on the frontal view. This was not seen on previous studies.  No other nodules. No focal infiltrate. IMPRESSION: Nodular density projected to the right of the heart on the frontal view. While this may represent artifact or confluence of shadows, a true nodule is not excluded. Recommend CT imaging for better evaluation. No acute abnormality. Electronically Signed   By: Dorise Bullion III M.D   On: 03/05/2016 12:55   Dg Pelvis 1-2 Views  Result Date: 03/05/2016 CLINICAL DATA:  Pain after fall EXAM: PELVIS - 1-2 VIEW COMPARISON:  None. FINDINGS: There is a mildly displaced left intertrochanteric hip fracture. No other bony abnormalities. IMPRESSION: Mildly displaced left intertrochanteric hip fracture. Electronically  Signed   By: Dorise Bullion III M.D   On: 03/05/2016 12:57   Ct Head Wo Contrast  Result Date: 03/05/2016 CLINICAL DATA:  Patient fell down steps and hit head. EXAM: CT HEAD WITHOUT CONTRAST TECHNIQUE: Contiguous axial images were obtained from the base of the skull through the vertex without intravenous contrast. COMPARISON:  None. FINDINGS: Brain: Soft tissue swelling/ hematoma seen over the left posterior scalp. No underlying fracture identified. No subdural, epidural, or subarachnoid hemorrhage. The cerebellum, brainstem, and basal cisterns are normal. No acute cortical ischemia or infarct no mass, mass effect, or midline shift. Ventricles and sulci are normal for age. Vascular: Calcified atherosclerosis associated with the intracranial portions of the carotid arteries. Skull: No acute fractures are seen Sinuses/Orbits: There is a tiny amount of fluid in right maxillary sinus. No associated fracture. The paranasal sinuses, mastoid air cells, and middle ears are otherwise normal. Other: No other abnormalities. IMPRESSION: No acute intracranial process. Electronically Signed   By: Dorise Bullion III M.D   On: 03/05/2016 13:30   Dg C-arm 1-60 Min  Result Date: 03/05/2016 CLINICAL DATA:  Left IM nail. EXAM: DG C-ARM 61-120 MIN; LEFT FEMUR 2 VIEWS COMPARISON:  Plain film of the left femur from earlier same day. FINDINGS: Four intraoperative fluoroscopic spot images show intra medullary nail fixation of the left femur fracture. Hardware appears intact and appropriately positioned. No evidence of surgical complicating feature. Fluoroscopy provided for 1 minutes 41 seconds. IMPRESSION: Intraoperative images showing intramedullary fixation of the left femur fracture. No evidence of surgical complicating feature. Electronically Signed   By: Franki Cabot M.D.   On: 03/05/2016 20:44   Dg Femur Min 2 Views Left  Result Date: 03/05/2016 CLINICAL DATA:  Left IM nail. EXAM: DG C-ARM 61-120 MIN; LEFT FEMUR 2 VIEWS  COMPARISON:  Plain film of the left femur from earlier same day. FINDINGS: Four intraoperative fluoroscopic spot images show intra medullary nail fixation of the left femur fracture. Hardware appears intact and appropriately positioned. No evidence of surgical complicating feature. Fluoroscopy provided for 1 minutes 41 seconds. IMPRESSION: Intraoperative images showing intramedullary fixation of the left femur fracture. No evidence of surgical complicating feature. Electronically Signed   By: Franki Cabot M.D.   On: 03/05/2016 20:44   Dg Femur Min 2 Views Left  Result Date: 03/05/2016 CLINICAL DATA:  Pain after fall EXAM: LEFT FEMUR 2 VIEWS COMPARISON:  None. FINDINGS: There is an intertrochanteric fracture through the proximal left femur. No dislocation. No other fractures identified. IMPRESSION: Proximal left intertrochanteric femoral fracture. Electronically Signed   By: Dorise Bullion III M.D   On: 03/05/2016 12:56        Scheduled Meds: . acetaminophen  1,000 mg Oral Q6H  .  ceFAZolin (ANCEF) IV  1 g Intravenous Q6H  . cholecalciferol  2,000 Units Oral Daily  . docusate sodium  100 mg  Oral BID  . dorzolamide  1 drop Both Eyes BID   And  . timolol  1 drop Both Eyes BID  . enoxaparin (LOVENOX) injection  40 mg Subcutaneous Q24H  . gabapentin  300 mg Oral TID  . latanoprost  1 drop Both Eyes QHS   Continuous Infusions: . sodium chloride 75 mL/hr at 03/06/16 0300  . lactated ringers 10 mL/hr at 03/05/16 1623     LOS: 1 day    Time spent: 35 minutes    Marks Scalera A, MD [facility] Pager [phone]  If 7PM-7AM, please contact night-coverage [email or web address] Password [email or web address] 03/06/2016, 10:08 AM

## 2016-03-06 NOTE — Evaluation (Signed)
Physical Therapy Evaluation Patient Details Name: Kim [name] MRN: [id number] DOB: 10-25-1940 Today's Date: 03/06/2016   History of Present Illness  75 y.o. female who fell on her stairs at home with resulting Lt hip fracture. Pt is now s/p IM nail on 03/05/16. PMH: osteoporosis, anemia, colostomy, Crohn disease.   Clinical Impression  Pt mobilizing very slowly throughout PT evaluation and anxious about pain. Pt unwilling to sit EOB despite encouragement but did work on bed level activities. Pt denied any increased pain with bed activity but continued to refuse to sit EOB. The pt and her husband were educated on the need for mobilizing to return to independent lifestyle. Additionally the pt also unwilling to have therapist touch her due to fear that it might hurt. Overall, the pt is very anxious regarding mobility and the possibility of increasing her pain. Recommending SNF for further rehabilitation at this time. PT to continue to follow and attempt to progress mobility as pt will allow.     Follow Up Recommendations SNF;Supervision for mobility/OOB    Equipment Recommendations  Rolling walker with 5" wheels;3in1 (PT)    Recommendations for Other Services       Precautions / Restrictions Precautions Precautions: Fall Precaution Comments: colostomy Restrictions Weight Bearing Restrictions: Yes LLE Weight Bearing: Weight bearing as tolerated      Mobility  Bed Mobility Overal bed mobility: Needs Assistance             General bed mobility comments: Pt able to lift pelvis for partial bridging and scooting laterally. Pt willing to sidestep feet to edge of bed in hooklying position but unwilling to bring LEs off edge of bed. Pt also able to use UEs to scoot self up in bed in trendelenburg position.   Transfers                    Ambulation/Gait                Stairs            Wheelchair Mobility    Modified Rankin (Stroke Patients Only)        Balance                                             Pertinent Vitals/Pain Pain Assessment: Faces Faces Pain Scale: Hurts little more Pain Location: Lt hip Pain Descriptors / Indicators: Guarding Pain Intervention(s): Monitored during session    Home Living Family/patient expects to be discharged to:: Unsure Living Arrangements: Spouse/significant other Available Help at Discharge: Family;Available 24 hours/day Type of Home: House Home Access: Stairs to enter Entrance Stairs-Rails: Psychiatric nurse of Steps: 3   Home Equipment: None Additional Comments: Pt states that she and her husband are moving to and independent living facility later this month.     Prior Function Level of Independence: Independent               Hand Dominance        Extremity/Trunk Assessment   Upper Extremity Assessment: Generalized weakness           Lower Extremity Assessment: LLE deficits/detail   LLE Deficits / Details: able to perform active hip flexion, extension, ankle dorsiflexion/plantarflexion.      Communication   Communication: No difficulties  Cognition Arousal/Alertness: Awake/alert Behavior During Therapy: Anxious Overall Cognitive Status: Within Functional Limits for  tasks assessed                      General Comments General comments (skin integrity, edema, etc.): Extended time needed for pt to participate in limited bed level activities. Pt refusing to have any physical assistance and very anxious about pain and mobility.     Exercises Other Exercises Other Exercises: heelslides for hip extension.   Assessment/Plan    PT Assessment Patient needs continued PT services  PT Problem List Decreased strength;Decreased range of motion;Decreased activity tolerance;Decreased balance;Decreased mobility          PT Treatment Interventions DME instruction;Gait training;Stair training;Functional mobility  training;Therapeutic activities;Therapeutic exercise;Balance training;Patient/family education    PT Goals (Current goals can be found in the Care Plan section)  Acute Rehab PT Goals Patient Stated Goal: get back to being independent PT Goal Formulation: With patient Time For Goal Achievement: 03/20/16 Potential to Achieve Goals: Fair    Frequency Min 3X/week   Barriers to discharge        Co-evaluation               End of Session Equipment Utilized During Treatment: Oxygen Activity Tolerance: Other (comment) (pt limited by fear of pain with mobility ) Patient left: in bed;with call bell/phone within reach;with family/visitor present;with SCD's reapplied Nurse Communication: Mobility status         Time: 5747-3403 PT Time Calculation (min) (ACUTE ONLY): 45 min   Charges:   PT Evaluation $PT Eval Moderate Complexity: 1 Procedure PT Treatments $Therapeutic Activity: 23-37 mins   PT G Codes:        Cassell Clement, PT, CSCS Pager [phone] Office [phone]  03/06/2016, 4:14 PM

## 2016-03-06 NOTE — Progress Notes (Signed)
Pt has episode of post viding urinary retention over 400 cc via Bladder Scan. Pt refused I/O catheter. Will encourage pt to be on bedpan on a timely manner and continue to monitor pt closely.

## 2016-03-07 ENCOUNTER — Encounter (HOSPITAL_COMMUNITY): Payer: Self-pay | Admitting: Orthopedic Surgery

## 2016-03-07 DIAGNOSIS — E43 Unspecified severe protein-calorie malnutrition: Secondary | ICD-10-CM

## 2016-03-07 LAB — BASIC METABOLIC PANEL
Anion gap: 6 (ref 5–15)
BUN: 19 mg/dL (ref 6–20)
CALCIUM: 8.1 mg/dL — AB (ref 8.9–10.3)
CO2: 25 mmol/L (ref 22–32)
CREATININE: 0.78 mg/dL (ref 0.44–1.00)
Chloride: 105 mmol/L (ref 101–111)
GFR calc non Af Amer: >60 mL/min (ref >60)
Glucose, Bld: 116 mg/dL — ABNORMAL HIGH (ref 65–99)
Potassium: 3.2 mmol/L — ABNORMAL LOW (ref 3.5–5.1)
SODIUM: 136 mmol/L (ref 135–145)

## 2016-03-07 LAB — CBC
HCT: 23.4 % — ABNORMAL LOW (ref 36.0–46.0)
Hemoglobin: 7.5 g/dL — ABNORMAL LOW (ref 12.0–15.0)
MCH: 31.5 pg (ref 26.0–34.0)
MCHC: 32.1 g/dL (ref 30.0–36.0)
MCV: 98.3 fL (ref 78.0–100.0)
PLATELETS: 126 10*3/uL — AB (ref 150–400)
RBC: 2.38 MIL/uL — AB (ref 3.87–5.11)
RDW: 16.1 % — AB (ref 11.5–15.5)
WBC: 6.7 10*3/uL (ref 4.0–10.5)

## 2016-03-07 MED ORDER — POTASSIUM CHLORIDE CRYS ER 20 MEQ PO TBCR
40.0000 meq | EXTENDED_RELEASE_TABLET | Freq: Four times a day (QID) | ORAL | Status: DC
  Filled 2016-03-07: qty 2

## 2016-03-07 MED ORDER — SODIUM CHLORIDE 0.9 % IV SOLN
INTRAVENOUS | Status: DC
  Administered 2016-03-07: 14:00:00 via INTRAVENOUS
  Filled 2016-03-07 (×3): qty 1000

## 2016-03-07 NOTE — Progress Notes (Signed)
Kim [name]  MRN: [id number] DOB/Age: 11-18-1940 75 y.o. Physician: Stann Mainland Procedure: Procedure(s) (LRB): INTRAMEDULLARY (IM) NAIL INTERTROCHANTRIC (Left)     Subjective: Doing much better this am, pleased with pain level and food.  Vital Signs Temp:  [98.2 F (36.8 C)-100.8 F (38.2 C)] 98.5 F (36.9 C) (10/02 0931) Pulse Rate:  [76-87] 87 (10/02 0931) Resp:  [16] 16 (10/02 0931) BP: (109-127)/(42-63) 113/42 (10/02 0931) SpO2:  [100 %] 100 % (10/02 0931)  Lab Results  Recent Labs  03/06/16 0703 03/07/16 0249  WBC 9.0 6.7  HGB 9.0* 7.5*  HCT 27.9* 23.4*  PLT 125* 126*   BMET  Recent Labs  03/06/16 0703 03/07/16 0249  NA 136 136  K 3.8 3.2*  CL 101 105  CO2 25 25  GLUCOSE 96 116*  BUN 21* 19  CREATININE 1.03* 0.78  CALCIUM 8.0* 8.1*   INR  Date Value Ref Range Status  03/05/2016 1.09  Final     Exam Left hip and thigh soft, dressing c/d/i, +motor intact distally, SILT distally 2+DP pulse        Plan -WBAT LLE -PT -DVT ppx with lovenox -pain controlled on current plan -will follow along for dc recs from therapy -maintain dressing, ok to shower in dressing but do not submerge under water.  Nicholes Stairs MD 03/07/2016, 12:02 PM Contact # [phone]

## 2016-03-07 NOTE — Progress Notes (Signed)
PROGRESS NOTE  Kim [name]  ZOX:[id number] DOB: 23-Jun-1940 DOA: 03/05/2016 PCP: Mathews Argyle, MD Outpatient Specialists:  Subjective: "Who are you" patient did not recognize me open (please note that I spent >30 minutes yesterday talking to her). Feels her pain regimen is controlling her pain better than yesterday. Tried to talk to her husband called his home and mobile phone without answer.  Brief Narrative:  Kim [name] is a 75 y.o. female with medical history significant for complicated Crohn's disease followed at [facility]/gastrology service; recent admission for bowel obstruction status post ileostomy, chronic protein calorie malnutrition, combination iron deficiency and anemia of chronic disease followed by Dr. Julien Nordmann with hematology. Patient presents with left hip pain and inability to bear weight on left leg after experiencing a mechanical fall on 9/29. Denied dizziness or other syncopal type symptoms prior to fall.  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 -Status post ORIF with intramedullary intertrochanteric nail done by Dr. Stann Mainland on 03/05/16. -DVT prophylaxis: with Lovenox. -Pain control: with opioids, felt pain control. Today. -PT/OT recommendation: SNF, she is to evaluate.  Anemia -Has history of iron deficiency anemia and anemia of chronic disease -Hemoglobin is 11.4 admission, hemoglobin dropped to 9.4 yesterday and 7.5 today. -Part of the hemoglobin drop is perioperative blood loss was IV fluid hydration and hemodilution. -Continue to monitor, if hemoglobin dropped less than the 7 AM we will transfuse.  Acute kidney  injury -Mild acute kidney injury with creatinine 1.03, this is resolved with IV fluids, creatinine today 0.7.  Hypokalemia -Potassium is 3.2, will replete with oral supplements.  Acute hyponatremia -Mild, likely related to volume depletion, this is resolved.  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 -Patient reports underwent creation of ileostomy in August of this year after admission for bowel obstruction; procedure was done at [facility] she has a variety of output from her stoma: 6/mushy versus watery with no particular pattern -No longer on medications for Crohn's although states her primary gastroenterologist is planning on starting her on Stelara; not on steroids as well -Currently no abdominal pain  Protein calorie malnutrition  -Patient reports that she does not like protein supplementation shakes  Lung nodule -Incidental finding on plain x-ray with radiologist recommending CT scan to better clarify   Peripheral neuropathy -Patient reports she developed after taking medication for Crohn's disease  DVT prophylaxis: Lovenox Code Status: Full Code Family Communication:  Disposition Plan:  Diet: Diet regular Room service appropriate? Yes; Fluid consistency: Thin  Consultants:   Stann Mainland, ortho  Procedures:   Intramedullary nail  Antimicrobials:   Perioperative antibiotics   Objective: Vitals:   03/06/16 1440 03/06/16 2044 03/07/16 0418 03/07/16 0931  BP: 127/63 (!) 114/53 (!) 109/46 (!) 113/42  Pulse: 76 80 84 87  Resp:  16 16 16   Temp: 98.2 F (36.8 C) 98.9 F (37.2 C) (!) 100.8 F (38.2 C) 98.5 F (36.9 C)  TempSrc: Oral Oral Oral Oral  SpO2: 100% 100% 100% 100%  Height:        Intake/Output Summary (Last 24 hours) at 03/07/16 1138 Last data filed at 03/06/16 1900  Gross per 24 hour  Intake                0 ml  Output              750 ml  Net             -750 ml   There were no vitals filed for this  visit.  Examination: General exam: Appears calm and comfortable  Respiratory system: Clear to auscultation. Respiratory effort normal. Cardiovascular system: S1 & S2 heard, RRR. No JVD, murmurs, rubs, gallops or clicks. No pedal edema. Gastrointestinal system: Abdomen is nondistended, soft and nontender. No organomegaly or masses felt. Normal bowel sounds heard. Central nervous system: Alert and oriented. No focal neurological deficits. Extremities: Symmetric 5 x 5 power. Skin: No rashes, lesions or ulcers Psychiatry: Judgement and insight appear normal. Mood & affect appropriate.   Data Reviewed: I have personally reviewed following labs and imaging studies  CBC:  Recent Labs Lab 03/05/16 1150 03/06/16 0703 03/07/16 0249  WBC 8.8 9.0 6.7  NEUTROABS 7.3  --   --   HGB 11.4* 9.0* 7.5*  HCT 36.1 27.9* 23.4*  MCV 98.6 97.6 98.3  PLT 155 125* 409*   Basic Metabolic Panel:  Recent Labs Lab 03/05/16 1150 03/06/16 0703 03/07/16 0249  NA 131* 136 136  K 3.6 3.8 3.2*  CL 96* 101 105  CO2 26 25 25   GLUCOSE 97 96 116*  BUN 21* 21* 19  CREATININE 0.85 1.03* 0.78  CALCIUM 9.2 8.0* 8.1*   GFR: CrCl cannot be calculated (Unknown ideal weight.). Liver Function Tests: No results for input(s): AST, ALT, ALKPHOS, BILITOT, PROT, ALBUMIN in the last 168 hours. No results for input(s): LIPASE, AMYLASE in the last 168 hours. No results for input(s): AMMONIA in the last 168 hours. Coagulation Profile:  Recent Labs Lab 03/05/16 1150  INR 1.09   Cardiac Enzymes: No results for input(s): CKTOTAL, CKMB, CKMBINDEX, TROPONINI in the last 168 hours. BNP (last 3 results) No results for input(s): PROBNP in the last 8760 hours. HbA1C: No results for input(s): HGBA1C in the last 72 hours. CBG: No results for input(s): GLUCAP in the last 168 hours. Lipid Profile: No results for input(s): CHOL, HDL, LDLCALC, TRIG, CHOLHDL, LDLDIRECT in the last 72 hours. Thyroid Function Tests: No  results for input(s): TSH, T4TOTAL, FREET4, T3FREE, THYROIDAB in the last 72 hours. Anemia Panel: No results for input(s): VITAMINB12, FOLATE, FERRITIN, TIBC, IRON, RETICCTPCT in the last 72 hours. Urine analysis:    Component Value Date/Time   COLORURINE YELLOW 03/06/2016 1531   APPEARANCEUR CLEAR 03/06/2016 1531   LABSPEC 1.025 03/06/2016 1531   PHURINE 5.0 03/06/2016 1531   GLUCOSEU NEGATIVE 03/06/2016 1531   HGBUR NEGATIVE 03/06/2016 1531   BILIRUBINUR NEGATIVE 03/06/2016 1531   BILIRUBINUR neg 08/22/2011 1010   KETONESUR NEGATIVE 03/06/2016 1531   PROTEINUR NEGATIVE 03/06/2016 1531   UROBILINOGEN negative 08/22/2011 1010   UROBILINOGEN 0.2 11/19/2007 0157   NITRITE NEGATIVE 03/06/2016 1531   LEUKOCYTESUR TRACE (A) 03/06/2016 1531   Sepsis Labs: @LABRCNTIP (procalcitonin:4,lacticidven:4)  )No results found for this or any previous visit (from the past 240 hour(s)).   Invalid input(s): PROCALCITONIN, LACTICACIDVEN   Radiology Studies: Dg Chest 1 View  Result Date: 03/05/2016 CLINICAL DATA:  Fall yesterday.  Pain. EXAM: CHEST 1 VIEW COMPARISON:  January 16, 2016 and November 19, 2007 FINDINGS: The heart, hila, and mediastinum are normal. No pneumothorax. No mass. There is a nodular density just to the right of the heart on  the frontal view. This was not seen on previous studies. No other nodules. No focal infiltrate. IMPRESSION: Nodular density projected to the right of the heart on the frontal view. While this may represent artifact or confluence of shadows, a true nodule is not excluded. Recommend CT imaging for better evaluation. No acute abnormality. Electronically Signed   By: Dorise Bullion III M.D   On: 03/05/2016 12:55   Dg Pelvis 1-2 Views  Result Date: 03/05/2016 CLINICAL DATA:  Pain after fall EXAM: PELVIS - 1-2 VIEW COMPARISON:  None. FINDINGS: There is a mildly displaced left intertrochanteric hip fracture. No other bony abnormalities. IMPRESSION: Mildly displaced left  intertrochanteric hip fracture. Electronically Signed   By: Dorise Bullion III M.D   On: 03/05/2016 12:57   Ct Head Wo Contrast  Result Date: 03/05/2016 CLINICAL DATA:  Patient fell down steps and hit head. EXAM: CT HEAD WITHOUT CONTRAST TECHNIQUE: Contiguous axial images were obtained from the base of the skull through the vertex without intravenous contrast. COMPARISON:  None. FINDINGS: Brain: Soft tissue swelling/ hematoma seen over the left posterior scalp. No underlying fracture identified. No subdural, epidural, or subarachnoid hemorrhage. The cerebellum, brainstem, and basal cisterns are normal. No acute cortical ischemia or infarct no mass, mass effect, or midline shift. Ventricles and sulci are normal for age. Vascular: Calcified atherosclerosis associated with the intracranial portions of the carotid arteries. Skull: No acute fractures are seen Sinuses/Orbits: There is a tiny amount of fluid in right maxillary sinus. No associated fracture. The paranasal sinuses, mastoid air cells, and middle ears are otherwise normal. Other: No other abnormalities. IMPRESSION: No acute intracranial process. Electronically Signed   By: Dorise Bullion III M.D   On: 03/05/2016 13:30   Dg C-arm 1-60 Min  Result Date: 03/05/2016 CLINICAL DATA:  Left IM nail. EXAM: DG C-ARM 61-120 MIN; LEFT FEMUR 2 VIEWS COMPARISON:  Plain film of the left femur from earlier same day. FINDINGS: Four intraoperative fluoroscopic spot images show intra medullary nail fixation of the left femur fracture. Hardware appears intact and appropriately positioned. No evidence of surgical complicating feature. Fluoroscopy provided for 1 minutes 41 seconds. IMPRESSION: Intraoperative images showing intramedullary fixation of the left femur fracture. No evidence of surgical complicating feature. Electronically Signed   By: Franki Cabot M.D.   On: 03/05/2016 20:44   Dg Femur Min 2 Views Left  Result Date: 03/05/2016 CLINICAL DATA:  Left IM  nail. EXAM: DG C-ARM 61-120 MIN; LEFT FEMUR 2 VIEWS COMPARISON:  Plain film of the left femur from earlier same day. FINDINGS: Four intraoperative fluoroscopic spot images show intra medullary nail fixation of the left femur fracture. Hardware appears intact and appropriately positioned. No evidence of surgical complicating feature. Fluoroscopy provided for 1 minutes 41 seconds. IMPRESSION: Intraoperative images showing intramedullary fixation of the left femur fracture. No evidence of surgical complicating feature. Electronically Signed   By: Franki Cabot M.D.   On: 03/05/2016 20:44   Dg Femur Min 2 Views Left  Result Date: 03/05/2016 CLINICAL DATA:  Pain after fall EXAM: LEFT FEMUR 2 VIEWS COMPARISON:  None. FINDINGS: There is an intertrochanteric fracture through the proximal left femur. No dislocation. No other fractures identified. IMPRESSION: Proximal left intertrochanteric femoral fracture. Electronically Signed   By: Dorise Bullion III M.D   On: 03/05/2016 12:56        Scheduled Meds: . cholecalciferol  2,000 Units Oral Daily  . docusate sodium  100 mg Oral BID  . dorzolamide  1 drop Both  Eyes BID   And  . timolol  1 drop Both Eyes BID  . enoxaparin (LOVENOX) injection  40 mg Subcutaneous Q24H  . gabapentin  300 mg Oral TID  . latanoprost  1 drop Both Eyes QHS  . potassium chloride  40 mEq Oral Q6H   Continuous Infusions: . sodium chloride 100 mL/hr at 03/06/16 1055     LOS: 2 days    Time spent: 35 minutes    Phyllis Whitefield A, MD [facility] Pager [phone]  If 7PM-7AM, please contact night-coverage [email or web address] Password [email or web address] 03/07/2016, 11:38 AM

## 2016-03-07 NOTE — Progress Notes (Signed)
Physical Therapy Treatment Patient Details Name: Kim [name] MRN: [id number] DOB: January 28, 1941 Today's Date: 03/07/2016    History of Present Illness 75 y.o. female who fell on her stairs at home with resulting Lt hip fracture. Pt is now s/p IM nail on 03/05/16. PMH: osteoporosis, anemia, colostomy, Crohn disease.     PT Comments    Pt moving extremely slowly with mobility, requiring over 1 hour for pt to transfer from supine to standing and return to supine. Pt is refusing to accept any physical assistance for mobility during session, stating that she is afraid it will hurt. Despite multiple offers to assist the pt, she repeatedly refused. Although the pt was able to progress to standing, the length of time needed is of limited functional benefit. Recommending SNF for further rehabilitation following acute stay. This was discussed with the pt and her spouse and they were in agreement after the session.   Follow Up Recommendations  SNF;Supervision for mobility/OOB     Equipment Recommendations   (to be addressed at next venue)    Recommendations for Other Services       Precautions / Restrictions Precautions Precautions: Fall Restrictions Weight Bearing Restrictions: Yes LLE Weight Bearing: Weight bearing as tolerated    Mobility  Bed Mobility Overal bed mobility: Needs Assistance Bed Mobility: Supine to Sit;Sit to Supine     Supine to sit: Supervision Sit to supine: Supervision   General bed mobility comments: Pt refusing any assistance with bed mobility, pt taking excessively long for mobility. Although supervision, nonfunctional due to extreme amount of time needed to complete task.   Transfers Overall transfer level: Needs assistance Equipment used: Rolling walker (2 wheeled) Transfers: Sit to/from Stand Sit to Stand: Min assist         General transfer comment: Pt very anxious regarding standing but willing to allow therapist to touch her for safety with standing.    Ambulation/Gait                 Stairs            Wheelchair Mobility    Modified Rankin (Stroke Patients Only)       Balance Overall balance assessment: Needs assistance Sitting-balance support: Single extremity supported Sitting balance-Leahy Scale: Poor     Standing balance support: Bilateral upper extremity supported Standing balance-Leahy Scale: Poor Standing balance comment: using rw                    Cognition Arousal/Alertness: Awake/alert Behavior During Therapy: Anxious;Agitated (variable agitation throughout session) Overall Cognitive Status: Within Functional Limits for tasks assessed                      Exercises      General Comments        Pertinent Vitals/Pain Pain Assessment: Faces Faces Pain Scale: Hurts little more Pain Location: Lt LE Pain Descriptors / Indicators: Guarding Pain Intervention(s): Limited activity within patient's tolerance;Monitored during session    Home Living                      Prior Function            PT Goals (current goals can now be found in the care plan section) Acute Rehab PT Goals Patient Stated Goal: be independent again PT Goal Formulation: With patient Time For Goal Achievement: 03/20/16 Potential to Achieve Goals: Fair Progress towards PT goals: Progressing toward goals    Frequency  Min 3X/week      PT Plan Current plan remains appropriate    Co-evaluation             End of Session   Activity Tolerance: Patient tolerated treatment well Patient left: in bed;with call bell/phone within reach;with family/visitor present;with SCD's reapplied     Time: 1517-6160 PT Time Calculation (min) (ACUTE ONLY): 67 min  Charges:  $Therapeutic Activity: 53-67 mins                    G Codes:      Cassell Clement, PT, CSCS Pager [phone] Office [phone]   03/07/2016, 4:46 PM

## 2016-03-07 NOTE — Progress Notes (Signed)
Initial Nutrition Assessment  DOCUMENTATION CODES:   Underweight  INTERVENTION:   -Snacks TID  NUTRITION DIAGNOSIS:   Increased nutrient needs related to  (post-op healing) as evidenced by estimated needs.  GOAL:   Patient will meet greater than or equal to 90% of their needs  MONITOR:   PO intake, Labs, Weight trends, Skin, I & O's  REASON FOR ASSESSMENT:   Consult Assessment of nutrition requirement/status  ASSESSMENT:   Kim [name] is a 75 y.o. female with medical history significant for complicated Crohn's disease followed at [facility]/gastrology service; recent admission for bowel obstruction status post ileostomy, chronic protein calorie malnutrition, combination iron deficiency and anemia of chronic disease followed by Dr. Julien Nordmann with hematology. Patient presents with left hip pain and inability to bear weight on left leg after experiencing a mechanical fall on 9/29. Denied dizziness or other syncopal type symptoms prior to fall.  Pt admitted with lt hip fracture.   S/p Procedure(s) (LRB) 03/05/16: INTRAMEDULLARY (IM) NAIL INTERTROCHANTRIC (Left)  Attempted to examine pt x 3, however, pt was visiting with nursing director and physical therapy or receiving nursing care at times of visits.   Chart reviewed; pt with hx of crohn's disease with ileostomy. Wt hx reviewed; pt has weighed between 75-80# over the past 3 years. Per H&P, pt does not like nutritional supplements.   Unable to identify malnutrition at this time, however, pt at risk due to increased needs for post-op healing, crohn's disease, and underweight status. RD will add high protein snacks between meals in effort to increase intake; will honor pt's preference not to receive oral nutrition supplements. Also agree with regular diet for increased food selections.  Labs reviewed: K: 3.2 (on PO supplementation).   Diet Order:  Diet regular Room service appropriate? Yes; Fluid consistency: Thin  Skin:  Reviewed,  no issues  Last BM:  03/06/16  Height:   Ht Readings from Last 1 Encounters:  03/07/16 4' 8"  (1.422 m)    Weight:   Wt Readings from Last 1 Encounters:  03/07/16 78 lb (35.4 kg)    Ideal Body Weight:  42.3 kg  BMI:  Body mass index is 17.49 kg/m.  Estimated Nutritional Needs:   Kcal:  1100-1300  Protein:  45-60 grams  Fluid:  1.1-1.3 L  EDUCATION NEEDS:   Education needs addressed  [name] A. Jimmye Norman, RD, LDN, CDE Pager: [phone] After hours Pager: [phone]

## 2016-03-07 NOTE — Progress Notes (Signed)
PT Cancellation Note  Patient Details Name: Kim [name] MRN: [id number] DOB: 07-22-1940   Cancelled Treatment:    Reason Eval/Treat Not Completed: Pt has refused PT services X2 today. Currently stating that she is not ready for PT at this time. Therapist offering to attempt to check back later if possible, pt stating that she may or may not work with therapy later.    Cassell Clement, PT, CSCS Pager [phone] Office [phone]  03/07/2016, 12:37 PM

## 2016-03-08 LAB — BASIC METABOLIC PANEL
Anion gap: 4 — ABNORMAL LOW (ref 5–15)
BUN: 16 mg/dL (ref 6–20)
CALCIUM: 8.1 mg/dL — AB (ref 8.9–10.3)
CO2: 23 mmol/L (ref 22–32)
Chloride: 107 mmol/L (ref 101–111)
Creatinine, Ser: 0.66 mg/dL (ref 0.44–1.00)
GFR calc Af Amer: >60 mL/min (ref >60)
GLUCOSE: 90 mg/dL (ref 65–99)
POTASSIUM: 3.5 mmol/L (ref 3.5–5.1)
Sodium: 134 mmol/L — ABNORMAL LOW (ref 135–145)

## 2016-03-08 LAB — CBC
HEMATOCRIT: 19.1 % — AB (ref 36.0–46.0)
Hemoglobin: 6.3 g/dL — CL (ref 12.0–15.0)
MCH: 32 pg (ref 26.0–34.0)
MCHC: 33 g/dL (ref 30.0–36.0)
MCV: 97 fL (ref 78.0–100.0)
Platelets: 128 10*3/uL — ABNORMAL LOW (ref 150–400)
RBC: 1.97 MIL/uL — ABNORMAL LOW (ref 3.87–5.11)
RDW: 16.4 % — AB (ref 11.5–15.5)
WBC: 5.6 10*3/uL (ref 4.0–10.5)

## 2016-03-08 LAB — POTASSIUM: POTASSIUM: 3.2 mmol/L — AB (ref 3.5–5.1)

## 2016-03-08 LAB — PREPARE RBC (CROSSMATCH)

## 2016-03-08 MED ORDER — POTASSIUM CHLORIDE IN NACL 40-0.9 MEQ/L-% IV SOLN
INTRAVENOUS | Status: DC
  Administered 2016-03-08: 100 mL/h via INTRAVENOUS
  Administered 2016-03-09: 10 mL/h via INTRAVENOUS
  Filled 2016-03-08 (×4): qty 1000

## 2016-03-08 MED ORDER — SODIUM CHLORIDE 0.9 % IV SOLN
Freq: Once | INTRAVENOUS | Status: AC
  Administered 2016-03-08: 13:00:00 via INTRAVENOUS

## 2016-03-08 MED ORDER — POTASSIUM CHLORIDE 10 MEQ/100ML IV SOLN
10.0000 meq | INTRAVENOUS | Status: DC
  Filled 2016-03-08 (×3): qty 100

## 2016-03-08 MED ORDER — FUROSEMIDE 10 MG/ML IJ SOLN
40.0000 mg | Freq: Once | INTRAMUSCULAR | Status: AC
  Administered 2016-03-08: 40 mg via INTRAVENOUS
  Filled 2016-03-08: qty 4

## 2016-03-08 MED ORDER — POTASSIUM CHLORIDE 10 MEQ/100ML IV SOLN
10.0000 meq | INTRAVENOUS | Status: AC
  Filled 2016-03-08 (×3): qty 100

## 2016-03-08 NOTE — Care Management Important Message (Signed)
Important Message  Patient Details  Name: Kim [name] MRN: [id number] Date of Birth: 1941-01-07   Medicare Important Message Given:  Yes    Calie Buttrey 03/08/2016, 11:49 AM

## 2016-03-08 NOTE — Progress Notes (Signed)
Kim [name]  MRN: [id number] DOB/Age: [birth year] 74 y.o. Physician: Stann Mainland Procedure: Procedure(s) (LRB): INTRAMEDULLARY (IM) NAIL INTERTROCHANTRIC (Left)     Subjective: Complaining of pain this am with mobility and denies desire to particpate in PT  Vital Signs Temp:  [98.2 F (36.8 C)-100 F (37.8 C)] 100 F (37.8 C) (10/03 1950) Pulse Rate:  [81-92] 92 (10/03 1950) Resp:  [16-18] 18 (10/03 1950) BP: (112-142)/(38-67) 140/52 (10/03 1950) SpO2:  [100 %] 100 % (10/03 1950)  Lab Results  Recent Labs  03/07/16 0249 03/08/16 0621  WBC 6.7 5.6  HGB 7.5* 6.3*  HCT 23.4* 19.1*  PLT 126* 128*   BMET  Recent Labs  03/07/16 0249 03/08/16 0621  NA 136 134*  K 3.2* 3.5  CL 105 107  CO2 25 23  GLUCOSE 116* 90  BUN 19 16  CREATININE 0.78 0.66  CALCIUM 8.1* 8.1*   INR  Date Value Ref Range Status  03/05/2016 1.09  Final     Exam Left hip and thigh soft, dressing c/d/i, +motor intact distally, SILT distally 2+DP pulse        Plan -WBAT LLE -PT -DVT ppx with lovenox -pain controlled on current plan -will follow along for dc recs from therapy -maintain dressing, ok to shower in dressing but do not submerge under water. -transfused today per Medicine team -I discussed at length with patient my desire for her to work with PT for the good of her outcome.  Nicholes Stairs MD 03/08/2016, 9:53 PM Contact # [phone]

## 2016-03-08 NOTE — Progress Notes (Signed)
PROGRESS NOTE  Kim [name]  PXT:[id number] DOB: [birth year] DOA: 03/05/2016 PCP: Mathews Argyle, MD Outpatient Specialists:  Subjective: Reported having pain, discussed with her low hemoglobin and transfusion. Again called her husband today at [phone] and he did not answer.  Brief Narrative:  Kim [name] is a 75 y.o. female with medical history significant for complicated Crohn's disease followed at [facility]/gastrology service; recent admission for bowel obstruction status post ileostomy, chronic protein calorie malnutrition, combination iron deficiency and anemia of chronic disease followed by Dr. Julien Nordmann with hematology. Patient presents with left hip pain and inability to bear weight on left leg after experiencing a mechanical fall on 9/29. Denied dizziness or other syncopal type symptoms prior to fall.  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 -Status post ORIF with intramedullary intertrochanteric nail done by Dr. Stann Mainland on 03/05/16. -DVT prophylaxis: with Lovenox. -Pain control: with opioids. -PT/OT recommendation: SNF, CSW consulted  Anemia -Has history of iron deficiency anemia and anemia of chronic disease -Hemoglobin is 11.4 admission, hemoglobin dropped to 9.4 yesterday and 7.5 today. -Part of the hemoglobin drop is perioperative blood loss was IV fluid hydration and hemodilution. -Hemoglobin dropped today to 6.3, ordered 2 units of blood to be transfused, I'll give Lasix with it.  Acute kidney injury -Mild acute kidney injury with creatinine 1.03, this is resolved with IV fluids, creatinine today 0.7.  Hypokalemia -Potassium is  3.2, resolved with IV supplementation  Acute hyponatremia -Mild, likely related to volume depletion, this is resolved.  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 -Patient reports underwent creation of ileostomy in August of this year after admission for bowel obstruction; procedure was done at [facility] she has a variety of output from her stoma: 6/mushy versus watery with no particular pattern -No longer on medications for Crohn's although states her primary gastroenterologist is planning on starting her on Stelara; not on steroids as well -Currently no abdominal pain  Protein calorie malnutrition  -Patient reports that she does not like protein supplementation shakes  Lung nodule -Incidental finding on plain x-ray with radiologist recommending CT scan to better clarify   Peripheral neuropathy -Patient reports she developed after taking medication for Crohn's disease  DVT prophylaxis: Lovenox Code Status: Full Code Family Communication:  Disposition Plan:  Diet: Diet regular Room service appropriate? Yes; Fluid consistency: Thin  Consultants:   Stann Mainland, ortho  Procedures:   Intramedullary nail  Antimicrobials:   Perioperative antibiotics   Objective: Vitals:   03/07/16 1437 03/07/16 1511 03/07/16 2012 03/08/16 0426  BP:  (!) 117/43 (!) 121/48 127/67  Pulse:  96 89 91  Resp:   16 16  Temp:  (!) 101.3 F (38.5 C) 99 F (37.2 C) 98.2 F (36.8 C)  TempSrc:  Oral Oral Oral  SpO2:  100% 100% 100%  Weight: 35.4 kg (78 lb)     Height: 4' 8"  (1.422 m)       Intake/Output Summary (Last 24 hours) at 03/08/16 1150 Last data filed at 03/07/16 1443  Gross per 24 hour  Intake                0  ml  Output              500 ml  Net             -500 ml   Filed Weights   03/07/16 1437  Weight: 35.4 kg (78 lb)    Examination: General exam: Appears calm and comfortable  Respiratory system: Clear to auscultation. Respiratory effort  normal. Cardiovascular system: S1 & S2 heard, RRR. No JVD, murmurs, rubs, gallops or clicks. No pedal edema. Gastrointestinal system: Abdomen is nondistended, soft and nontender. No organomegaly or masses felt. Normal bowel sounds heard. Central nervous system: Alert and oriented. No focal neurological deficits. Extremities: Symmetric 5 x 5 power. Skin: No rashes, lesions or ulcers Psychiatry: Judgement and insight appear normal. Mood & affect appropriate.   Data Reviewed: I have personally reviewed following labs and imaging studies  CBC:  Recent Labs Lab 03/05/16 1150 03/06/16 0703 03/07/16 0249 03/08/16 0621  WBC 8.8 9.0 6.7 5.6  NEUTROABS 7.3  --   --   --   HGB 11.4* 9.0* 7.5* 6.3*  HCT 36.1 27.9* 23.4* 19.1*  MCV 98.6 97.6 98.3 97.0  PLT 155 125* 126* 630*   Basic Metabolic Panel:  Recent Labs Lab 03/05/16 1150 03/06/16 0703 03/07/16 0249 03/08/16 0621  NA 131* 136 136 134*  K 3.6 3.8 3.2* 3.5  CL 96* 101 105 107  CO2 26 25 25 23   GLUCOSE 97 96 116* 90  BUN 21* 21* 19 16  CREATININE 0.85 1.03* 0.78 0.66  CALCIUM 9.2 8.0* 8.1* 8.1*   GFR: Estimated Creatinine Clearance: 34.5 mL/min (by C-G formula based on SCr of 0.66 mg/dL). Liver Function Tests: No results for input(s): AST, ALT, ALKPHOS, BILITOT, PROT, ALBUMIN in the last 168 hours. No results for input(s): LIPASE, AMYLASE in the last 168 hours. No results for input(s): AMMONIA in the last 168 hours. Coagulation Profile:  Recent Labs Lab 03/05/16 1150  INR 1.09   Cardiac Enzymes: No results for input(s): CKTOTAL, CKMB, CKMBINDEX, TROPONINI in the last 168 hours. BNP (last 3 results) No results for input(s): PROBNP in the last 8760 hours. HbA1C: No results for input(s): HGBA1C in the last 72 hours. CBG: No results for input(s): GLUCAP in the last 168 hours. Lipid Profile: No results for input(s): CHOL, HDL, LDLCALC, TRIG, CHOLHDL, LDLDIRECT in the last 72 hours. Thyroid Function Tests: No  results for input(s): TSH, T4TOTAL, FREET4, T3FREE, THYROIDAB in the last 72 hours. Anemia Panel: No results for input(s): VITAMINB12, FOLATE, FERRITIN, TIBC, IRON, RETICCTPCT in the last 72 hours. Urine analysis:    Component Value Date/Time   COLORURINE YELLOW 03/06/2016 1531   APPEARANCEUR CLEAR 03/06/2016 1531   LABSPEC 1.025 03/06/2016 1531   PHURINE 5.0 03/06/2016 1531   GLUCOSEU NEGATIVE 03/06/2016 1531   HGBUR NEGATIVE 03/06/2016 1531   BILIRUBINUR NEGATIVE 03/06/2016 1531   BILIRUBINUR neg 08/22/2011 1010   KETONESUR NEGATIVE 03/06/2016 1531   PROTEINUR NEGATIVE 03/06/2016 1531   UROBILINOGEN negative 08/22/2011 1010   UROBILINOGEN 0.2 11/19/2007 0157   NITRITE NEGATIVE 03/06/2016 1531   LEUKOCYTESUR TRACE (A) 03/06/2016 1531   Sepsis Labs: @LABRCNTIP (procalcitonin:4,lacticidven:4)  )No results found for this or any previous visit (from the past 240 hour(s)).   Invalid input(s): PROCALCITONIN, [place]   Radiology Studies: No results found.      Scheduled Meds: . sodium chloride   Intravenous Once  . cholecalciferol  2,000 Units Oral Daily  . docusate sodium  100 mg Oral BID  . dorzolamide  1 drop Both Eyes BID   And  . timolol  1 drop Both Eyes BID  . enoxaparin (LOVENOX) injection  40 mg Subcutaneous Q24H  . gabapentin  300 mg Oral TID  . latanoprost  1 drop Both Eyes QHS   Continuous Infusions: . 0.9 % NaCl with KCl 40 mEq / L 10 mL/hr (03/08/16 0809)     LOS: 3 days    Time spent: 35 minutes    Elta Angell A, MD [facility] Pager [phone]  If 7PM-7AM, please contact night-coverage [email or web address] Password [email or web address] 03/08/2016, 11:50 AM

## 2016-03-08 NOTE — Progress Notes (Signed)
CRITICAL VALUE ALERT  Critical value received:  Hemoglobin 6.3  Date of notification: 10/3  Time of notification:  0745  Critical value read back: yes  Nurse who received alert:  Jari Sportsman  MD notified (1st page):  Dr. Jerilynn Mages. [place]  Time of first page:  0800  MD notified (2nd page):  Time of second page:   Responding MD:  Dr Lajean Manes  Time MD responded: [phone]

## 2016-03-09 DIAGNOSIS — S72002D Fracture of unspecified part of neck of left femur, subsequent encounter for closed fracture with routine healing: Secondary | ICD-10-CM

## 2016-03-09 LAB — TYPE AND SCREEN
ABO/RH(D): AB POS
Antibody Screen: NEGATIVE
Unit division: 0
Unit division: 0

## 2016-03-09 LAB — CBC
HEMATOCRIT: 31.6 % — AB (ref 36.0–46.0)
HEMOGLOBIN: 10.6 g/dL — AB (ref 12.0–15.0)
MCH: 30.3 pg (ref 26.0–34.0)
MCHC: 33.5 g/dL (ref 30.0–36.0)
MCV: 90.3 fL (ref 78.0–100.0)
Platelets: 147 10*3/uL — ABNORMAL LOW (ref 150–400)
RBC: 3.5 MIL/uL — AB (ref 3.87–5.11)
RDW: 18 % — ABNORMAL HIGH (ref 11.5–15.5)
WBC: 6.1 10*3/uL (ref 4.0–10.5)

## 2016-03-09 LAB — BASIC METABOLIC PANEL
Anion gap: 7 (ref 5–15)
BUN: 18 mg/dL (ref 6–20)
CHLORIDE: 102 mmol/L (ref 101–111)
CO2: 26 mmol/L (ref 22–32)
CREATININE: 0.7 mg/dL (ref 0.44–1.00)
Calcium: 8.5 mg/dL — ABNORMAL LOW (ref 8.9–10.3)
GFR calc non Af Amer: >60 mL/min (ref >60)
Glucose, Bld: 100 mg/dL — ABNORMAL HIGH (ref 65–99)
Potassium: 3.6 mmol/L (ref 3.5–5.1)
Sodium: 135 mmol/L (ref 135–145)

## 2016-03-09 MED ORDER — POTASSIUM CHLORIDE CRYS ER 10 MEQ PO TBCR
30.0000 meq | EXTENDED_RELEASE_TABLET | Freq: Once | ORAL | Status: AC
  Administered 2016-03-09: 30 meq via ORAL
  Filled 2016-03-09: qty 1

## 2016-03-09 MED ORDER — OXYCODONE HCL 5 MG PO TABS
10.0000 mg | ORAL_TABLET | ORAL | Status: DC | PRN
  Administered 2016-03-09 – 2016-03-11 (×10): 10 mg via ORAL
  Filled 2016-03-09 (×10): qty 2

## 2016-03-09 NOTE — NC FL2 (Signed)
[place] LEVEL OF CARE SCREENING TOOL     IDENTIFICATION  Patient Name: Kim [name] Birthdate: [birth year] Sex: female Admission Date (Current Location): 03/05/2016  [facility] and [place] Number:  Herbalist and Address:  [facility], [place] [address], [place], [place] [address]      Provider Number: [id number]  Attending Physician Name and Address:  Doreatha Lew, MD  Relative Name and Phone Number:       Current Level of Care: Hospital Recommended Level of Care: [place] Prior Approval Number:    Date Approved/Denied:   PASRR Number:    Discharge Plan: SNF    Current Diagnoses: Patient Active Problem List   Diagnosis Date Noted  . Hip fracture ([place]) 03/05/2016  . Protein calorie malnutrition ([place]) 03/05/2016  . Acute hyponatremia 03/05/2016  . Lung nodule 03/05/2016  . Osteoporosis 03/18/2013  . Glaucoma 03/18/2013  . 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 OA (osteoarthritis)   . Anemia of chronic disease 06/07/2012  . Iron deficiency anemia 05/10/2011    Orientation RESPIRATION BLADDER Height & Weight     Time, Situation, Self, Place  Normal Continent Weight: 78 lb (35.4 kg) (03/03/16 encounter) Height:  4' 8"  (142.2 cm) (03/03/16 encounter)  BEHAVIORAL SYMPTOMS/MOOD NEUROLOGICAL BOWEL NUTRITION STATUS      Continent    AMBULATORY STATUS COMMUNICATION OF NEEDS Skin   Extensive Assist Verbally Normal                       Personal Care Assistance Level of Assistance  Dressing, Bathing Bathing Assistance: Maximum assistance   Dressing Assistance: Maximum assistance     Functional Limitations Info             SPECIAL CARE FACTORS FREQUENCY                       Contractures      Additional Factors Info                  Current Medications (03/09/2016):  This is the current hospital active medication list Current  Facility-Administered Medications  Medication Dose Route Frequency Provider Last Rate Last Dose  . 0.9 % NaCl with KCl 40 mEq / L  infusion   Intravenous Continuous Verlee Monte, MD 10 mL/hr at 03/08/16 0809 10 mL/hr at 03/08/16 0809  . acetaminophen (TYLENOL) tablet 650 mg  650 mg Oral Q6H PRN Nicholes Stairs, MD   650 mg at 03/09/16 1747   Or  . acetaminophen (TYLENOL) suppository 650 mg  650 mg Rectal Q6H PRN Nicholes Stairs, MD      . cholecalciferol (VITAMIN D) tablet 2,000 Units  2,000 Units Oral Daily Samella Parr, NP   2,000 Units at 03/09/16 1141  . docusate sodium (COLACE) capsule 100 mg  100 mg Oral BID Samella Parr, NP   100 mg at 03/08/16 0951  . dorzolamide (TRUSOPT) 2 % ophthalmic solution 1 drop  1 drop Both Eyes BID Roxan Hockey, MD   1 drop at 03/08/16 0942   And  . timolol (TIMOPTIC) 0.5 % ophthalmic solution 1 drop  1 drop Both Eyes BID Roxan Hockey, MD   1 drop at 03/08/16 0943  . enoxaparin (LOVENOX) injection 40 mg  40 mg Subcutaneous Q24H Nicholes Stairs, MD   40 mg at 03/09/16 1142  .  fentaNYL (SUBLIMAZE) injection 50 mcg  50 mcg Intravenous Q2H PRN Samella Parr, NP   50 mcg at 03/05/16 1430  . gabapentin (NEURONTIN) capsule 300 mg  300 mg Oral TID Samella Parr, NP   300 mg at 03/09/16 1531  . latanoprost (XALATAN) 0.005 % ophthalmic solution 1 drop  1 drop Both Eyes QHS Samella Parr, NP   1 drop at 03/07/16 2136  . methocarbamol (ROBAXIN) 500 mg in dextrose 5 % 50 mL IVPB  500 mg Intravenous Q8H PRN Tracy Shuford, PA-C      . methocarbamol (ROBAXIN) tablet 500 mg  500 mg Oral Q8H PRN Tracy Shuford, PA-C   500 mg at 03/09/16 1747  . metoCLOPramide (REGLAN) tablet 5-10 mg  5-10 mg Oral Q8H PRN Nicholes Stairs, MD       Or  . metoCLOPramide (REGLAN) injection 5-10 mg  5-10 mg Intravenous Q8H PRN Nicholes Stairs, MD      . ondansetron [facility]) injection 4 mg  4 mg Intravenous Q6H PRN Samella Parr, NP      . ondansetron  [facility]) tablet 4 mg  4 mg Oral Q6H PRN Nicholes Stairs, MD       Or  . ondansetron [facility]) injection 4 mg  4 mg Intravenous Q6H PRN Nicholes Stairs, MD      . oxyCODONE (Oxy IR/ROXICODONE) immediate release tablet 10 mg  10 mg Oral Q3H PRN Doreatha Lew, MD   10 mg at 03/09/16 1830   Facility-Administered Medications Ordered in Other Encounters  Medication Dose Route Frequency Provider Last Rate Last Dose  . acetaminophen (TYLENOL) tablet 650 mg  650 mg Oral Once Carlton Adam, PA-C         Discharge Medications: Please see discharge summary for a list of discharge medications.  Relevant Imaging Results:  Relevant Lab Results:   Additional Information    Venetia Maxon, [place]

## 2016-03-09 NOTE — Progress Notes (Signed)
Patient has been very irritable and condescending towards staff. Told she is getting IV potassium. Med hung and ran at half the rate. Later heard patient screaming and crying out that her arm is burning. Rate slowed down and eventually stopped because patient can not tolerate it. Offered to dilute it down and run it even slower but patient completely refused. Shouting out " my nurse is trying to kill me" over and over. Attempted to explain to patient her need for the IV potassium and how we can try to dilute it down to prevent the burning. Patient is very angry and continue to shout the nurse is trying to kill her and she wants to talk to the doctor, specifically doctor Stann Mainland. Explained to patient medical ordered the potassium and they would be the person to speak to but patient continue to want to speak to doctor Stann Mainland and medical now too as well. Doctor Stann Mainland office paged and he was not on call for himself. Asked for his direct number but answer service was unable to provide one. Patient was informed of the call. Medical team was also notified of patient refusal of IV potassium. Medication stopped and stat K+ level was ordered. Patient continue to be very mad at situation. Stating we are hurting her and why would we do that to her. Attempt to console patient and explain the reason and situation but she is not hearing it. Will continue to monitor.

## 2016-03-09 NOTE — Progress Notes (Signed)
PROGRESS NOTE  Kim [name]  VXB:[id number] DOB: 10-Feb-1941 DOA: 03/05/2016 PCP: Mathews Argyle, MD Outpatient Specialists: .  Brief Narrative:  Kim [name] is a 75 y.o. female with medical history significant for complicated Crohn's disease followed at [facility]/gastrology service; recent admission for bowel obstruction status post ileostomy, chronic protein calorie malnutrition, combination iron deficiency and anemia of chronic disease followed by Dr. Julien Nordmann with hematology. Patient presents with left hip pain and inability to bear weight on left leg after experiencing a mechanical fall on 9/29. Denied dizziness or other syncopal type symptoms prior to fall.  Subjective: Patient seen and examined at bedside. Patient was very upset because she was given Kcl last night and was very painful. Patient stated "you guys are trying to kill me" Patient is unhappy because she has not seen the xray after the procedure and also her pain is not well controlled, although Dilaudid make her "loopy".    Assessment & Plan:  Hip fracture  -Patient presents with left intertrochanteric hip fracture after mechanical fall. -Status post ORIF with intramedullary intertrochanteric nail done by Dr. Stann Mainland on 03/05/16. -DVT prophylaxis: with Lovenox. -Pain control: dilaudid d/c, increase Oxy to 10 mg  -PT/OT recommendation: SNF - Patient stated she does not feel ready to be discharge.   Anemia - likely due to blood loss and hemodilution - s/p 2 PRBC, Hb 10.6 -Has history of iron deficiency anemia and anemia of chronic disease. 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 -Patient reports underwent creation of ileostomy in August of this year after admission for bowel obstruction; procedure was done at [place] she has a variety of output from her stoma:  6/mushy versus watery with no particular pattern -No longer on medications for Crohn's although states her primary gastroenterologist is planning on starting her on Stelara; not on steroids as well -Currently no abdominal pain  Protein calorie malnutrition  -Patient reports that she does not like protein supplementation shakes  Lung nodule -Incidental finding on plain x-ray with radiologist recommending CT scan to better clarify - Can be done as outpatient    Peripheral neuropathy -Patient reports she developed after taking medication for Crohn's disease  DVT prophylaxis: Lovenox Code Status: Full Code Family Communication:  Husband at bedside Disposition Plan: SNF or Home with PT per nursing staff patient cleared by ortho.   Consultants:   Stann Mainland, ortho  Procedures:   Intramedullary nail  Antimicrobials:   Perioperative antibiotics   Objective: Vitals:   03/08/16 1700 03/08/16 1950 03/09/16 0600 03/09/16 1447  BP: (!) 142/59 (!) 140/52 (!) 138/55 (!) 120/53  Pulse: 90 92 86 84  Resp: 18 18 20 18   Temp: 99.8 F (37.7 C) 100 F (37.8 C) 98.5 F (36.9 C) 99.4 F (37.4 C)  TempSrc: Oral Oral Axillary   SpO2: 100% 100% 100% 100%  Weight:      Height:        Intake/Output Summary (Last 24 hours) at 03/09/16 1550 Last data filed at 03/09/16 3300  Gross per 24 hour  Intake              768 ml  Output             1275 ml  Net             -507 ml   Filed  Weights   03/07/16 1437  Weight: 35.4 kg (78 lb)    Examination: General exam: Appears calm and comfortable  Respiratory system: Clear to auscultation. Respiratory effort normal. Cardiovascular system: S1 & S2 heard, RRR. No JVD, murmurs, rubs, gallops or clicks. No pedal edema. Gastrointestinal system: Abdomen is nondistended, soft and nontender. No organomegaly or masses felt. Central nervous system: Alert and oriented. No focal neurological deficits. Extremities: Symmetric 5 x 5 power. Skin: No rashes,  lesions or ulcers Psychiatry: Judgement and insight appear normal. Mood & affect appropriate.   Data Reviewed: I have personally reviewed following labs and imaging studies  CBC:  Recent Labs Lab 03/05/16 1150 03/06/16 0703 03/07/16 0249 03/08/16 0621 03/09/16 0629  WBC 8.8 9.0 6.7 5.6 6.1  NEUTROABS 7.3  --   --   --   --   HGB 11.4* 9.0* 7.5* 6.3* 10.6*  HCT 36.1 27.9* 23.4* 19.1* 31.6*  MCV 98.6 97.6 98.3 97.0 90.3  PLT 155 125* 126* 128* 073*   Basic Metabolic Panel:  Recent Labs Lab 03/05/16 1150 03/06/16 0703 03/07/16 0249 03/08/16 0621 03/08/16 2254 03/09/16 0629  NA 131* 136 136 134*  --  135  K 3.6 3.8 3.2* 3.5 3.2* 3.6  CL 96* 101 105 107  --  102  CO2 26 25 25 23   --  26  GLUCOSE 97 96 116* 90  --  100*  BUN 21* 21* 19 16  --  18  CREATININE 0.85 1.03* 0.78 0.66  --  0.70  CALCIUM 9.2 8.0* 8.1* 8.1*  --  8.5*   GFR: Estimated Creatinine Clearance: 34.5 mL/min (by C-G formula based on SCr of 0.7 mg/dL). Liver Function Tests: No results for input(s): AST, ALT, ALKPHOS, BILITOT, PROT, ALBUMIN in the last 168 hours. No results for input(s): LIPASE, AMYLASE in the last 168 hours. No results for input(s): AMMONIA in the last 168 hours. Coagulation Profile:  Recent Labs Lab 03/05/16 1150  INR 1.09   Cardiac Enzymes: No results for input(s): CKTOTAL, CKMB, CKMBINDEX, TROPONINI in the last 168 hours. BNP (last 3 results) No results for input(s): PROBNP in the last 8760 hours. HbA1C: No results for input(s): HGBA1C in the last 72 hours. CBG: No results for input(s): GLUCAP in the last 168 hours. Lipid Profile: No results for input(s): CHOL, HDL, LDLCALC, TRIG, CHOLHDL, LDLDIRECT in the last 72 hours. Thyroid Function Tests: No results for input(s): TSH, T4TOTAL, FREET4, T3FREE, THYROIDAB in the last 72 hours. Anemia Panel: No results for input(s): VITAMINB12, FOLATE, FERRITIN, TIBC, IRON, RETICCTPCT in the last 72 hours. Urine analysis:      Component Value Date/Time   COLORURINE YELLOW 03/06/2016 1531   APPEARANCEUR CLEAR 03/06/2016 1531   LABSPEC 1.025 03/06/2016 1531   PHURINE 5.0 03/06/2016 1531   GLUCOSEU NEGATIVE 03/06/2016 1531   HGBUR NEGATIVE 03/06/2016 1531   BILIRUBINUR NEGATIVE 03/06/2016 1531   BILIRUBINUR neg 08/22/2011 1010   KETONESUR NEGATIVE 03/06/2016 1531   PROTEINUR NEGATIVE 03/06/2016 1531   UROBILINOGEN negative 08/22/2011 1010   UROBILINOGEN 0.2 11/19/2007 0157   NITRITE NEGATIVE 03/06/2016 1531   LEUKOCYTESUR TRACE (A) 03/06/2016 1531   Sepsis Labs: @LABRCNTIP (procalcitonin:4,lacticidven:4)  )No results found for this or any previous visit (from the past 240 hour(s)).   Invalid input(s): PROCALCITONIN, [place]   Radiology Studies: No results found.   Scheduled Meds: . cholecalciferol  2,000 Units Oral Daily  . docusate sodium  100 mg Oral BID  . dorzolamide  1 drop Both Eyes BID  And  . timolol  1 drop Both Eyes BID  . enoxaparin (LOVENOX) injection  40 mg Subcutaneous Q24H  . gabapentin  300 mg Oral TID  . latanoprost  1 drop Both Eyes QHS   Continuous Infusions: . 0.9 % NaCl with KCl 40 mEq / L 10 mL/hr (03/08/16 0809)     LOS: 4 days    Chipper Oman, MD [facility] Pager [phone]  If 7PM-7AM, please contact night-coverage [email or web address] Password [email or web address] 03/09/2016, 3:50 PM

## 2016-03-10 DIAGNOSIS — E43 Unspecified severe protein-calorie malnutrition: Secondary | ICD-10-CM | POA: Insufficient documentation

## 2016-03-10 MED ORDER — WHITE PETROLATUM GEL
Status: AC
  Administered 2016-03-10: 05:00:00
  Filled 2016-03-10: qty 1

## 2016-03-10 MED ORDER — FERROUS SULFATE 325 (65 FE) MG PO TABS: 325.0000 mg | ORAL_TABLET | Freq: Every day | ORAL | 3 refills | Status: DC

## 2016-03-10 MED ORDER — HYDROCODONE-ACETAMINOPHEN 5-325 MG PO TABS: 2.0000 | ORAL_TABLET | ORAL | 0 refills | Status: DC | PRN

## 2016-03-10 MED ORDER — ORAL CARE MOUTH RINSE
15.0000 mL | Freq: Two times a day (BID) | OROMUCOSAL | Status: DC
  Administered 2016-03-10 – 2016-03-12 (×4): 15 mL via OROMUCOSAL

## 2016-03-10 NOTE — Care Management Important Message (Signed)
Important Message  Patient Details  Name: Kim [name] MRN: [id number] Date of Birth: 11-27-1940   Medicare Important Message Given:  Yes    [name] 03/10/2016, 12:16 PM

## 2016-03-10 NOTE — Progress Notes (Signed)
Patients pointer finger on her left hand is red and has a small blister where the continuous pulse ox was at previously. It was removed at 730 this am, removed by patient Chenika Nevils A Archivist, RN

## 2016-03-10 NOTE — Clinical Social Work Placement (Signed)
   CLINICAL SOCIAL WORK PLACEMENT  NOTE  Date:  03/10/2016  Patient Details  Name: Kim [name] MRN: [id number] Date of Birth: 07/31/1940  Clinical Social Work is seeking post-discharge placement for this patient at the [place] level of care (*CSW will initial, date and re-position this form in  chart as items are completed):      Patient/family provided with [place] Work Department's list of facilities offering this level of care within the geographic area requested by the patient (or if unable, by the patient's family).  Yes   Patient/family informed of their freedom to choose among providers that offer the needed level of care, that participate in Medicare, Medicaid or managed care program needed by the patient, have an available bed and are willing to accept the patient.  Yes   Patient/family informed of [facility]'s ownership interest in [facility] and [facility], as well as of the fact that they are under no obligation to receive care at these facilities.  PASRR submitted to EDS on 03/10/16     PASRR number received on 03/10/16     Existing PASRR number confirmed on       FL2 transmitted to all facilities in geographic area requested by pt/family on 03/10/16     FL2 transmitted to all facilities within larger geographic area on       Patient informed that his/her managed care company has contracts with or will negotiate with certain facilities, including the following:        Yes   Patient/family informed of bed offers received.  Patient chooses bed at       Physician recommends and patient chooses bed at      Patient to be transferred to   on  .  Patient to be transferred to facility by       Patient family notified on   of transfer.  Name of family member notified:        PHYSICIAN Please sign FL2, Please prepare priority discharge summary, including medications     Additional Comment:    Barbette Or,  [place]

## 2016-03-10 NOTE — Progress Notes (Signed)
PROGRESS NOTE  CORIANNA AVALLONE  ZOX:[id number] DOB: 04-12-1941 DOA: 03/05/2016 PCP: Mathews Argyle, MD Outpatient Specialists: .  Brief Narrative:  JOETTE SCHMOKER is a 75 y.o. female with medical history significant for complicated Crohn's disease followed at [facility]/gastrology service; recent admission for bowel obstruction status post ileostomy, chronic protein calorie malnutrition, combination iron deficiency and anemia of chronic disease followed by Dr. Julien Nordmann with hematology. Patient presents with left hip pain and inability to bear weight on left leg after experiencing a mechanical fall on 9/29. Denied dizziness or other syncopal type symptoms prior to fall.  Subjective: Patient seen and examined at bedside. Patient continues to be unhappy because she has not seen the xray after the procedure, also stated that she is not ready for discharge. Patient report not seen nursing staff and no ones cares about her. Patient has been clear by ortho and is medically stable to go to SNF with Pt. Patient is refusing to leave until she feel that is able to walk.    Assessment & Plan:  Hip fracture  -Patient presents with left intertrochanteric hip fracture after mechanical fall. -Status post ORIF with intramedullary intertrochanteric nail done by Dr. Stann Mainland on 03/05/16. -DVT prophylaxis: with Lovenox. -Pain control: dilaudid d/c, increase Oxy to 10 mg  -PT/OT recommendation: SNF - Patient stated she does not feel ready to be discharge.   Anemia - likely due to blood loss and hemodilution - s/p 2 PRBC, Hb 10.6 -Has history of iron deficiency anemia and anemia of chronic disease. 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 -Patient reports underwent creation of ileostomy in August of this year after admission for bowel obstruction; procedure was  done at [place] she has a variety of output from her stoma: 6/mushy versus watery with no particular pattern -No longer on medications for Crohn's although states her primary gastroenterologist is planning on starting her on Stelara; not on steroids as well -Currently no abdominal pain  Protein calorie malnutrition  -Patient reports that she does not like protein supplementation shakes  Lung nodule -Incidental finding on plain x-ray with radiologist recommending CT scan to better clarify - Can be done as outpatient    Peripheral neuropathy -Patient reports she developed after taking medication for Crohn's disease  DVT prophylaxis: Lovenox Code Status: Full Code Family Communication:  Husband at bedside Disposition Plan: SNF or Home with PT per nursing staff patient cleared by ortho.   Consultants:   Stann Mainland, ortho  Procedures:   Intramedullary nail  Antimicrobials:   Perioperative antibiotics   Objective: Vitals:   03/09/16 1447 03/09/16 1952 03/10/16 0412 03/10/16 1100  BP: (!) 120/53 112/84 (!) 134/59 (!) 130/52  Pulse: 84 77 86 83  Resp: 18 18 16 17   Temp: 99.4 F (37.4 C) 99.4 F (37.4 C) 100 F (37.8 C)   TempSrc:  Oral Oral   SpO2: 100% 98% 100% 100%  Weight:      Height:        Intake/Output Summary (Last 24 hours) at 03/10/16 1434 Last data filed at 03/10/16 1300  Gross per 24 hour  Intake              305 ml  Output              400 ml  Net              -  95 ml   Filed Weights   03/07/16 1437  Weight: 35.4 kg (78 lb)    Examination: General exam: Appears calm and comfortable  Respiratory system: Clear to auscultation. Respiratory effort normal. Cardiovascular system: S1 & S2 heard, RRR. No JVD, murmurs, rubs, gallops or clicks. No pedal edema. Gastrointestinal system: Abdomen is nondistended, soft and nontender. No organomegaly or masses felt. Central nervous system: Alert and oriented. No focal neurological deficits. Extremities:  Symmetric 5 x 5 power. Skin: No rashes, lesions or ulcers Psychiatry: Judgement and insight appear normal. Mood & affect appropriate.   Data Reviewed: I have personally reviewed following labs and imaging studies  CBC:  Recent Labs Lab 03/05/16 1150 03/06/16 0703 03/07/16 0249 03/08/16 0621 03/09/16 0629  WBC 8.8 9.0 6.7 5.6 6.1  NEUTROABS 7.3  --   --   --   --   HGB 11.4* 9.0* 7.5* 6.3* 10.6*  HCT 36.1 27.9* 23.4* 19.1* 31.6*  MCV 98.6 97.6 98.3 97.0 90.3  PLT 155 125* 126* 128* 786*   Basic Metabolic Panel:  Recent Labs Lab 03/05/16 1150 03/06/16 0703 03/07/16 0249 03/08/16 0621 03/08/16 2254 03/09/16 0629  NA 131* 136 136 134*  --  135  K 3.6 3.8 3.2* 3.5 3.2* 3.6  CL 96* 101 105 107  --  102  CO2 26 25 25 23   --  26  GLUCOSE 97 96 116* 90  --  100*  BUN 21* 21* 19 16  --  18  CREATININE 0.85 1.03* 0.78 0.66  --  0.70  CALCIUM 9.2 8.0* 8.1* 8.1*  --  8.5*   GFR: Estimated Creatinine Clearance: 34.5 mL/min (by C-G formula based on SCr of 0.7 mg/dL). Liver Function Tests: No results for input(s): AST, ALT, ALKPHOS, BILITOT, PROT, ALBUMIN in the last 168 hours. No results for input(s): LIPASE, AMYLASE in the last 168 hours. No results for input(s): AMMONIA in the last 168 hours. Coagulation Profile:  Recent Labs Lab 03/05/16 1150  INR 1.09   Cardiac Enzymes: No results for input(s): CKTOTAL, CKMB, CKMBINDEX, TROPONINI in the last 168 hours. BNP (last 3 results) No results for input(s): PROBNP in the last 8760 hours. HbA1C: No results for input(s): HGBA1C in the last 72 hours. CBG: No results for input(s): GLUCAP in the last 168 hours. Lipid Profile: No results for input(s): CHOL, HDL, LDLCALC, TRIG, CHOLHDL, LDLDIRECT in the last 72 hours. Thyroid Function Tests: No results for input(s): TSH, T4TOTAL, FREET4, T3FREE, THYROIDAB in the last 72 hours. Anemia Panel: No results for input(s): VITAMINB12, FOLATE, FERRITIN, TIBC, IRON, RETICCTPCT in the  last 72 hours. Urine analysis:    Component Value Date/Time   COLORURINE YELLOW 03/06/2016 1531   APPEARANCEUR CLEAR 03/06/2016 1531   LABSPEC 1.025 03/06/2016 1531   PHURINE 5.0 03/06/2016 1531   GLUCOSEU NEGATIVE 03/06/2016 1531   HGBUR NEGATIVE 03/06/2016 1531   BILIRUBINUR NEGATIVE 03/06/2016 1531   BILIRUBINUR neg 08/22/2011 1010   KETONESUR NEGATIVE 03/06/2016 1531   PROTEINUR NEGATIVE 03/06/2016 1531   UROBILINOGEN negative 08/22/2011 1010   UROBILINOGEN 0.2 11/19/2007 0157   NITRITE NEGATIVE 03/06/2016 1531   LEUKOCYTESUR TRACE (A) 03/06/2016 1531   Sepsis Labs: @LABRCNTIP (procalcitonin:4,lacticidven:4)  )No results found for this or any previous visit (from the past 240 hour(s)).   Invalid input(s): PROCALCITONIN, [place]   Radiology Studies: No results found.   Scheduled Meds: . cholecalciferol  2,000 Units Oral Daily  . docusate sodium  100 mg Oral BID  . dorzolamide  1 drop  Both Eyes BID   And  . timolol  1 drop Both Eyes BID  . enoxaparin (LOVENOX) injection  40 mg Subcutaneous Q24H  . gabapentin  300 mg Oral TID  . latanoprost  1 drop Both Eyes QHS  . mouth rinse  15 mL Mouth Rinse BID   Continuous Infusions: . 0.9 % NaCl with KCl 40 mEq / L 10 mL/hr at 03/10/16 0000     LOS: 5 days    Chipper Oman, MD [facility] Pager [phone]  If 7PM-7AM, please contact night-coverage [email or web address] Password [email or web address] 03/10/2016, 2:34 PM

## 2016-03-10 NOTE — NC FL2 (Signed)
[place] LEVEL OF CARE SCREENING TOOL     IDENTIFICATION  Patient Name: Kim [name] Birthdate: [birth year] Sex: female Admission Date (Current Location): 03/05/2016  [facility] and [place] Number:  Herbalist and Address:  [facility] [place], [place] [address] [place], [place] [address]      Provider Number: [id number]  Attending Physician Name and Address:  Doreatha Lew, MD  Relative Name and Phone Number:       Current Level of Care: Hospital Recommended Level of Care: [place] Prior Approval Number:    Date Approved/Denied:   PASRR Number: [id number]  Discharge Plan: SNF    Current Diagnoses: Patient Active Problem List   Diagnosis Date Noted  . Hip fracture ([place]) 03/05/2016  . Protein calorie malnutrition ([place]) 03/05/2016  . Acute hyponatremia 03/05/2016  . Lung nodule 03/05/2016  . Osteoporosis 03/18/2013  . Glaucoma 03/18/2013  . 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 OA (osteoarthritis)   . Anemia of chronic disease 06/07/2012  . Iron deficiency anemia 05/10/2011    Orientation RESPIRATION BLADDER Height & Weight     Time, Situation, Self, Place  Normal Continent Weight: 78 lb (35.4 kg) (03/03/16 encounter) Height:  4' 8"  (142.2 cm) (03/03/16 encounter)  BEHAVIORAL SYMPTOMS/MOOD NEUROLOGICAL BOWEL NUTRITION STATUS      Continent    AMBULATORY STATUS COMMUNICATION OF NEEDS Skin   Extensive Assist Verbally Normal                       Personal Care Assistance Level of Assistance  Dressing, Bathing Bathing Assistance: Maximum assistance   Dressing Assistance: Maximum assistance     Functional Limitations Info             SPECIAL CARE FACTORS FREQUENCY                       Contractures      Additional Factors Info                  Current Medications (03/10/2016):  This is the current hospital active medication list Current  Facility-Administered Medications  Medication Dose Route Frequency Provider Last Rate Last Dose  . 0.9 % NaCl with KCl 40 mEq / L  infusion   Intravenous Continuous Verlee Monte, MD 10 mL/hr at 03/10/16 0000    . acetaminophen (TYLENOL) tablet 650 mg  650 mg Oral Q6H PRN Nicholes Stairs, MD   650 mg at 03/09/16 1747   Or  . acetaminophen (TYLENOL) suppository 650 mg  650 mg Rectal Q6H PRN Nicholes Stairs, MD      . cholecalciferol (VITAMIN D) tablet 2,000 Units  2,000 Units Oral Daily Samella Parr, NP   2,000 Units at 03/10/16 0859  . docusate sodium (COLACE) capsule 100 mg  100 mg Oral BID Samella Parr, NP   100 mg at 03/08/16 0951  . dorzolamide (TRUSOPT) 2 % ophthalmic solution 1 drop  1 drop Both Eyes BID Roxan Hockey, MD   1 drop at 03/10/16 0901   And  . timolol (TIMOPTIC) 0.5 % ophthalmic solution 1 drop  1 drop Both Eyes BID Roxan Hockey, MD   1 drop at 03/08/16 0943  . enoxaparin (LOVENOX) injection 40 mg  40 mg Subcutaneous Q24H Nicholes Stairs, MD   40 mg at 03/09/16 1142  . fentaNYL (SUBLIMAZE) injection 50  mcg  50 mcg Intravenous Q2H PRN Samella Parr, NP   50 mcg at 03/05/16 1430  . gabapentin (NEURONTIN) capsule 300 mg  300 mg Oral TID Samella Parr, NP   300 mg at 03/10/16 0900  . latanoprost (XALATAN) 0.005 % ophthalmic solution 1 drop  1 drop Both Eyes QHS Samella Parr, NP   1 drop at 03/07/16 2136  . MEDLINE mouth rinse  15 mL Mouth Rinse BID Doreatha Lew, MD   15 mL at 03/10/16 0416  . methocarbamol (ROBAXIN) 500 mg in dextrose 5 % 50 mL IVPB  500 mg Intravenous Q8H PRN Tracy Shuford, PA-C      . methocarbamol (ROBAXIN) tablet 500 mg  500 mg Oral Q8H PRN Tracy Shuford, PA-C   500 mg at 03/10/16 0500  . metoCLOPramide (REGLAN) tablet 5-10 mg  5-10 mg Oral Q8H PRN Nicholes Stairs, MD       Or  . metoCLOPramide (REGLAN) injection 5-10 mg  5-10 mg Intravenous Q8H PRN Nicholes Stairs, MD      . ondansetron [facility]) injection 4 mg   4 mg Intravenous Q6H PRN Samella Parr, NP      . ondansetron [facility]) tablet 4 mg  4 mg Oral Q6H PRN Nicholes Stairs, MD       Or  . ondansetron [facility]) injection 4 mg  4 mg Intravenous Q6H PRN Nicholes Stairs, MD      . oxyCODONE (Oxy IR/ROXICODONE) immediate release tablet 10 mg  10 mg Oral Q3H PRN Doreatha Lew, MD   10 mg at 03/10/16 0804   Facility-Administered Medications Ordered in Other Encounters  Medication Dose Route Frequency Provider Last Rate Last Dose  . acetaminophen (TYLENOL) tablet 650 mg  650 mg Oral Once [place] Adam, PA-C         Discharge Medications: Please see discharge summary for a list of discharge medications.  Relevant Imaging Results:  Relevant Lab Results:   Additional Information    [employer], LCSW

## 2016-03-10 NOTE — Clinical Social Work Note (Signed)
Clinical Social Work Assessment  Patient Details  Name: Kim [name] MRN: [id number] Date of Birth: [birth year]  Date of referral:  03/10/16               Reason for consult:  Facility Placement                Permission sought to share information with:  Family Supports Permission granted to share information::  Yes, Verbal Permission Granted  Name::     Micha Erck  Relationship::  Husband  Contact Information:  [phone]  Housing/Transportation Living arrangements for the past 2 months:  [place] of Information:  Patient, Spouse Patient Interpreter Needed:  None Criminal Activity/Legal Involvement Pertinent to Current Situation/Hospitalization:  No - Comment as needed Significant Relationships:  Spouse Lives with:  Spouse Do you feel safe going back to the place where you live?  Yes Need for family participation in patient care:  Yes (Comment)  Care giving concerns:  Patient husband at bedside and states that patient is not medically ready for discharge in his opinion.  Patient husband is adamant that patient will discharge to a facility with a private room.   Social Worker assessment / plan:  Holiday representative met with patient and spouse to offer support and discuss patient needs at discharge.  Patient and patient husband both state that in their opinion patient is not medically stable for discharge.  Patient and her husband both live at home with plans to move to [facility] on October 18,2017.  CSW provided patient and husband with information regarding SNF bed available at [facility], however it is a shared room.  Patient and husband are adamant about private room availability and have requested CSW to contact all bed offers to determine who has a private room available.  CSW to contact facilities and notify patient and spouse of available private rooms.  CSW informed patient and patient husband of discharge today due to patient being  medically stable.  Patient husband states that he plans to appeal the discharge and does not feel patient is medically ready.  CSW notified RNCM and updated MD on patient and patient husband wishes.  CSW remains available for support and to facilitate patient discharge needs.  Employment status:  Retired Nurse, adult PT Recommendations:  [place] / Referral to community resources:  [place]  Patient/Family's Response to care:  Patient and patient spouse both verbalize understanding of CSW role.  Patient stated, "I do not like you and you are just kicking me out."  CSW attempted to facilitate positive conversation regarding patient need for discharge, however patient expresses paranoid behaviors regarding all hospital personnel.  Patient and spouse are agreeable to SNF placement, just not today.  Patient/Family's Understanding of and Emotional Response to Diagnosis, Current Treatment, and Prognosis:  Patient and patient spouse have an unrealistic view of patient medical necessity for continued hospital stay.  Patient has been medically ready for discharge.  Patient and spouse are both emotional regarding patient plans for discharge.  Emotional Assessment Appearance:  Appears stated age Attitude/Demeanor/Rapport:  Guarded, Complaining, Avoidant, Paranoid Affect (typically observed):  Anxious, Defensive, Tearful/Crying Orientation:  Oriented to Self, Oriented to Situation, Oriented to Place, Oriented to  Time Alcohol / Substance use:  Not Applicable Psych involvement (Current and /or in the community):  No (Comment)  Discharge Needs  Concerns to be addressed:  Discharge Planning Concerns Readmission within the last 30 days:  No Current discharge risk:  Dependent with Mobility Barriers to Discharge:  Continued Medical Work up, No Barriers Identified   Barbette Or, [place]

## 2016-03-10 NOTE — Progress Notes (Signed)
Nutrition Follow-up  DOCUMENTATION CODES:   Severe malnutrition in context of chronic illness, Underweight  INTERVENTION:  Continue providing nourishment snacks.  Pt refuses nutritional supplements.   Plans for possible discharge today.   NUTRITION DIAGNOSIS:   Increased nutrient needs related to  (post-op healing) as evidenced by estimated needs; ongoing  GOAL:   Patient will meet greater than or equal to 90% of their needs; not met  MONITOR:   PO intake, Labs, Weight trends, Skin, I & O's  REASON FOR ASSESSMENT:   Consult Assessment of nutrition requirement/status  ASSESSMENT:   Kim [name] is a 75 y.o. female with medical history significant for complicated Crohn's disease followed at [facility]/gastrology service; recent admission for bowel obstruction status post ileostomy, chronic protein calorie malnutrition, combination iron deficiency and anemia of chronic disease followed by Dr. Julien Nordmann with hematology. Patient presents with left hip pain and inability to bear weight on left leg after experiencing a mechanical fall on 9/29. Denied dizziness or other syncopal type symptoms prior to fall.  Pt reports appetite is fine. Meal completion however has been varied from 25-100%. Pt usually consumes 3 meals a day with snacks in between. Pt currently has nourishment snacks ordered as pt refused nutritional supplements. Pt encouraged to eat her food at meals. Plans for possible discharge today to SNF.   Nutrition-Focused physical exam completed. Findings are severe fat depletion, severe muscle depletion, and mild edema.   Labs and medications reviewed.   Diet Order:  Diet regular Room service appropriate? Yes; Fluid consistency: Thin Diet - low sodium heart healthy  Skin:  Reviewed, no issues  Last BM:  03/06/16  Height:   Ht Readings from Last 1 Encounters:  03/07/16 4' 8"  (1.422 m)    Weight:   Wt Readings from Last 1 Encounters:  03/07/16 78 lb (35.4 kg)     Ideal Body Weight:  42.3 kg  BMI:  Body mass index is 17.49 kg/m.  Estimated Nutritional Needs:   Kcal:  1100-1300  Protein:  45-60 grams  Fluid:  1.1-1.3 L  EDUCATION NEEDS:   Education needs addressed  Corrin Parker, MS, RD, LDN Pager # [phone] After hours/ weekend pager # [phone]

## 2016-03-10 NOTE — Progress Notes (Addendum)
Patient  Very emotional this am and upset, very uncomfortable and verbalizing frustrations on being sick and not feeling better. Verbalized she rather be dead, psychological support and encouragment given to patient. Rema Jasmine was notified of patients feelings, was given pain medicine and was able to repositoned comfortably. Non-compliant with bedside care refused the continous pulse oximetry and blood sugar check.

## 2016-03-10 NOTE — Discharge Summary (Addendum)
Physician Discharge Summary  Kim [name]  IBB:[id number]  DOB: [birth year]  DOA: 03/05/2016 PCP: Mathews Argyle, MD  Admit date: 03/05/2016 Discharge date: 03/10/2016  Admitted From: Home Disposition:  SNF  Recommendations for Outpatient Follow-up:  1. Follow up with PCP in 1-2 weeks 2. Please obtain BMP/CBC in one week 3. Need CT Chest for incidental lung nodule   Home Health: None Equipment/Devices: None  Discharge Condition: Stable  CODE STATUS: FULL  Diet recommendation: Heart Healthy  Brief/Interim Summary: Kim [name] a 75 y.o.femalewith medical history significant for complicated Crohn's disease followed at [facility]/gastrology service;recent admission for bowel obstruction status post ileostomy, chronic protein calorie malnutrition, combination iron deficiency and anemia of chronic disease followed by Dr. Julien Nordmann with hematology. Patient presents with left hip pain and inability to bear weight on left leg after experiencing a mechanical fall on 9/29. Admitted for left hip fracture, now s/p ORIF day 5. During hospital stay patient received 2 units or PRBC, H/H back to baseline. Patient has been difficult to control pain. But now has improved. Patient need PT after surgical procedure.    Subjective:  Had long discussion with the patient regarding her benefit of going to SNF with PT patient will be discharge with pain control to SNF. Patient seems to be depress, she stated that this started after her surgery here, refuses to get psych evaluation.   Discharge Diagnoses:   Hip fracture  -Patient presents with left intertrochanteric hip fracture after mechanical fall. -Status post ORIF with intramedullary intertrochanteric nail done by Dr. Stann Mainland on 03/05/16. -DVT prophylaxis: with Lovenox. -Pain management as needed   Anemia - likely due to blood loss and hemodilution - s/p 2 PRBC, Hb 10.6 -Has history of iron deficiency anemia and anemia of chronic  disease. 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 -Patient reports underwent creation of ileostomy in August of this year after admission for bowel obstruction;procedure was done at [place] she has a variety of output from her stoma:6/mushy versus watery with no particular pattern -No longer on medications for Crohn's although states her primary gastroenterologist is planning on starting her on Stelara; -Currently no abdominal pain  Lung nodule -Incidental finding on plain x-ray with radiologist recommending CT scan to better clarify - Can be done as outpatient   Peripheral neuropathy -Patient reports she developed after taking medication for Crohn's disease    Discharge Instructions  Discharge Instructions    Call MD for:  difficulty breathing, headache or visual disturbances    Complete by:  As directed    Call MD for:  extreme fatigue    Complete by:  As directed    Call MD for:  hives    Complete by:  As directed    Call MD for:  persistant dizziness or light-headedness    Complete by:  As directed    Call MD for:  persistant nausea and vomiting    Complete by:  As directed    Call MD for:  redness, tenderness, or signs of infection (pain, swelling, redness, odor or green/yellow discharge around incision site)    Complete by:  As directed    Call MD for:  severe uncontrolled pain    Complete by:  As directed    Call MD for:  temperature >100.4    Complete by:  As directed    Diet - low sodium heart healthy    Complete by:  As directed  Discharge instructions    Complete by:  As directed    Full weight bearing    Complete by:  As directed    Increase activity slowly    Complete by:  As directed        Medication List    TAKE these medications   acetaminophen 500 MG tablet Commonly known as:  TYLENOL Take 500 mg by mouth 2 (two) times daily.   cholecalciferol 1000 units tablet Commonly  known as:  VITAMIN D Take 2,000 Units by mouth daily.   cyanocobalamin 1000 MCG/ML injection Commonly known as:  (VITAMIN B-12) INJECT 1 MILLILITER INTO MUSCLE EVERY 30 DAYS   dorzolamide-timolol 22.3-6.8 MG/ML ophthalmic solution Commonly known as:  COSOPT Place 1 drop into both eyes every 12 (twelve) hours.   enoxaparin 40 MG/0.4ML injection Commonly known as:  LOVENOX Inject 0.4 mLs (40 mg total) into the skin daily.   ferrous sulfate 325 (65 FE) MG tablet Commonly known as:  FERROUSUL Take 1 tablet (325 mg total) by mouth daily with breakfast.   gabapentin 300 MG capsule Commonly known as:  NEURONTIN Take 300 mg by mouth 3 (three) times daily.   HYDROcodone-acetaminophen 5-325 MG tablet Commonly known as:  NORCO/VICODIN Take 2 tablets by mouth every 4 (four) hours as needed for moderate pain. What changed:  how much to take  when to take this   latanoprost 0.005 % ophthalmic solution Commonly known as:  XALATAN Place 1 drop into both eyes at bedtime.   Melatonin 10 MG Tbcr Take 10 mg by mouth at bedtime as needed for sleep.   ONE-A-DAY WOMENS 50+ ADVANTAGE Tabs Take 1 tablet by mouth daily.   oxyCODONE 5 MG immediate release tablet Commonly known as:  Oxy IR/ROXICODONE Take 5 mg by mouth every 6 (six) hours as needed (for pain). What changed:  Another medication with the same name was added. Make sure you understand how and when to take each.   oxyCODONE 5 MG immediate release tablet Commonly known as:  ROXICODONE Take 1 tablet (5 mg total) by mouth every 4 (four) hours as needed for moderate pain. What changed:  You were already taking a medication with the same name, and this prescription was added. Make sure you understand how and when to take each.      Follow-up Information    Nicholes Stairs, MD. Schedule an appointment as soon as possible for a visit in 2 weeks.   Specialty:  Orthopedic Surgery Contact information: [address] [place]  [address] [place] [address] [phone]          Allergies  Allergen Reactions  . Antihistamines, Chlorpheniramine-Type     Dry  Mouth, dry eyes-so dry she cannot even talk  . Metronidazole     Peripheral neuropathy  . Other Diarrhea and Nausea And Vomiting    Patient has an Ileostomy  . Sulfa Antibiotics Other (See Comments)    UNKNOWN  . Tape Other (See Comments)    SKIN WILL TEAR WITH ANYTHING OTHER THAN PAPER TAPE OR COBAN WRAP    Consultations:  Ortho Dr. Stann Mainland   Procedures/Studies: Dg Chest 1 View  Result Date: 03/05/2016 CLINICAL DATA:  Fall yesterday.  Pain. EXAM: CHEST 1 VIEW COMPARISON:  January 16, 2016 and November 19, 2007 FINDINGS: The heart, hila, and mediastinum are normal. No pneumothorax. No mass. There is a nodular density just to the right of the heart on the frontal view. This was not seen on previous studies. No  other nodules. No focal infiltrate. IMPRESSION: Nodular density projected to the right of the heart on the frontal view. While this may represent artifact or confluence of shadows, a true nodule is not excluded. Recommend CT imaging for better evaluation. No acute abnormality. Electronically Signed   By: Dorise Bullion III M.D   On: 03/05/2016 12:55   Dg Pelvis 1-2 Views  Result Date: 03/05/2016 CLINICAL DATA:  Pain after fall EXAM: PELVIS - 1-2 VIEW COMPARISON:  None. FINDINGS: There is a mildly displaced left intertrochanteric hip fracture. No other bony abnormalities. IMPRESSION: Mildly displaced left intertrochanteric hip fracture. Electronically Signed   By: Dorise Bullion III M.D   On: 03/05/2016 12:57   Ct Head Wo Contrast  Result Date: 03/05/2016 CLINICAL DATA:  Patient fell down steps and hit head. EXAM: CT HEAD WITHOUT CONTRAST TECHNIQUE: Contiguous axial images were obtained from the base of the skull through the vertex without intravenous contrast. COMPARISON:  None. FINDINGS: Brain: Soft tissue swelling/ hematoma seen over the left  posterior scalp. No underlying fracture identified. No subdural, epidural, or subarachnoid hemorrhage. The cerebellum, brainstem, and basal cisterns are normal. No acute cortical ischemia or infarct no mass, mass effect, or midline shift. Ventricles and sulci are normal for age. Vascular: Calcified atherosclerosis associated with the intracranial portions of the carotid arteries. Skull: No acute fractures are seen Sinuses/Orbits: There is a tiny amount of fluid in right maxillary sinus. No associated fracture. The paranasal sinuses, mastoid air cells, and middle ears are otherwise normal. Other: No other abnormalities. IMPRESSION: No acute intracranial process. Electronically Signed   By: Dorise Bullion III M.D   On: 03/05/2016 13:30   Dg C-arm 1-60 Min  Result Date: 03/05/2016 CLINICAL DATA:  Left IM nail. EXAM: DG C-ARM 61-120 MIN; LEFT FEMUR 2 VIEWS COMPARISON:  Plain film of the left femur from earlier same day. FINDINGS: Four intraoperative fluoroscopic spot images show intra medullary nail fixation of the left femur fracture. Hardware appears intact and appropriately positioned. No evidence of surgical complicating feature. Fluoroscopy provided for 1 minutes 41 seconds. IMPRESSION: Intraoperative images showing intramedullary fixation of the left femur fracture. No evidence of surgical complicating feature. Electronically Signed   By: Franki Cabot M.D.   On: 03/05/2016 20:44   Dg Femur Min 2 Views Left  Result Date: 03/05/2016 CLINICAL DATA:  Left IM nail. EXAM: DG C-ARM 61-120 MIN; LEFT FEMUR 2 VIEWS COMPARISON:  Plain film of the left femur from earlier same day. FINDINGS: Four intraoperative fluoroscopic spot images show intra medullary nail fixation of the left femur fracture. Hardware appears intact and appropriately positioned. No evidence of surgical complicating feature. Fluoroscopy provided for 1 minutes 41 seconds. IMPRESSION: Intraoperative images showing intramedullary fixation of the  left femur fracture. No evidence of surgical complicating feature. Electronically Signed   By: Franki Cabot M.D.   On: 03/05/2016 20:44   Dg Femur Min 2 Views Left  Result Date: 03/05/2016 CLINICAL DATA:  Pain after fall EXAM: LEFT FEMUR 2 VIEWS COMPARISON:  None. FINDINGS: There is an intertrochanteric fracture through the proximal left femur. No dislocation. No other fractures identified. IMPRESSION: Proximal left intertrochanteric femoral fracture. Electronically Signed   By: Dorise Bullion III M.D   On: 03/05/2016 12:56    Discharge Exam: Vitals:   03/10/16 0412 03/10/16 1100  BP: (!) 134/59 (!) 130/52  Pulse: 86 83  Resp: 16 17  Temp: 100 F (37.8 C)    Vitals:   03/09/16 1447 03/09/16 1952 03/10/16  2355 03/10/16 1100  BP: (!) 120/53 112/84 (!) 134/59 (!) 130/52  Pulse: 84 77 86 83  Resp: 18 18 16 17   Temp: 99.4 F (37.4 C) 99.4 F (37.4 C) 100 F (37.8 C)   TempSrc:  Oral Oral   SpO2: 100% 98% 100% 100%  Weight:      Height:        General: Pt is alert, awake, not in acute distress Cardiovascular: RRR, S1/S2 +, no rubs, no gallops Respiratory: CTA bilaterally, no wheezing, no rhonchi Abdominal: Soft, NT, ND, bowel sounds + Extremities: no edema, no cyanosis    The results of significant diagnostics from this hospitalization (including imaging, microbiology, ancillary and laboratory) are listed below for reference.     Microbiology: No results found for this or any previous visit (from the past 240 hour(s)).   Labs: BNP (last 3 results) No results for input(s): BNP in the last 8760 hours. Basic Metabolic Panel:  Recent Labs Lab 03/05/16 1150 03/06/16 0703 03/07/16 0249 03/08/16 0621 03/08/16 2254 03/09/16 0629  NA 131* 136 136 134*  --  135  K 3.6 3.8 3.2* 3.5 3.2* 3.6  CL 96* 101 105 107  --  102  CO2 26 25 25 23   --  26  GLUCOSE 97 96 116* 90  --  100*  BUN 21* 21* 19 16  --  18  CREATININE 0.85 1.03* 0.78 0.66  --  0.70  CALCIUM 9.2 8.0* 8.1*  8.1*  --  8.5*   Liver Function Tests: No results for input(s): AST, ALT, ALKPHOS, BILITOT, PROT, ALBUMIN in the last 168 hours. No results for input(s): LIPASE, AMYLASE in the last 168 hours. No results for input(s): AMMONIA in the last 168 hours. CBC:  Recent Labs Lab 03/05/16 1150 03/06/16 0703 03/07/16 0249 03/08/16 0621 03/09/16 0629  WBC 8.8 9.0 6.7 5.6 6.1  NEUTROABS 7.3  --   --   --   --   HGB 11.4* 9.0* 7.5* 6.3* 10.6*  HCT 36.1 27.9* 23.4* 19.1* 31.6*  MCV 98.6 97.6 98.3 97.0 90.3  PLT 155 125* 126* 128* 147*   Cardiac Enzymes: No results for input(s): CKTOTAL, CKMB, CKMBINDEX, TROPONINI in the last 168 hours. BNP: Invalid input(s): POCBNP CBG: No results for input(s): GLUCAP in the last 168 hours. D-Dimer No results for input(s): DDIMER in the last 72 hours. Hgb A1c No results for input(s): HGBA1C in the last 72 hours. Lipid Profile No results for input(s): CHOL, HDL, LDLCALC, TRIG, CHOLHDL, LDLDIRECT in the last 72 hours. Thyroid function studies No results for input(s): TSH, T4TOTAL, T3FREE, THYROIDAB in the last 72 hours.  Invalid input(s): FREET3 Anemia work up No results for input(s): VITAMINB12, FOLATE, FERRITIN, TIBC, IRON, RETICCTPCT in the last 72 hours. Urinalysis    Component Value Date/Time   COLORURINE YELLOW 03/06/2016 1531   APPEARANCEUR CLEAR 03/06/2016 1531   LABSPEC 1.025 03/06/2016 1531   PHURINE 5.0 03/06/2016 1531   GLUCOSEU NEGATIVE 03/06/2016 1531   HGBUR NEGATIVE 03/06/2016 1531   BILIRUBINUR NEGATIVE 03/06/2016 1531   BILIRUBINUR neg 08/22/2011 1010   KETONESUR NEGATIVE 03/06/2016 1531   PROTEINUR NEGATIVE 03/06/2016 1531   UROBILINOGEN negative 08/22/2011 1010   UROBILINOGEN 0.2 11/19/2007 0157   NITRITE NEGATIVE 03/06/2016 1531   LEUKOCYTESUR TRACE (A) 03/06/2016 1531   Sepsis Labs Invalid input(s): PROCALCITONIN,  WBC,  LACTICIDVEN Microbiology No results found for this or any previous visit (from the past 240  hour(s)).   Time coordinating discharge: Over 30  minutes counseling and planing for patients discharge   SIGNED:  Chipper Oman, MD  [facility] 03/10/2016, 3:51 PM Pager   If 7PM-7AM, please contact night-coverage [email or web address] Password [email or web address]

## 2016-03-10 NOTE — Progress Notes (Signed)
Physical Therapy Treatment Patient Details Name: Kim [name] MRN: [id number] DOB: 1940/08/01 Today's Date: 03/10/2016    History of Present Illness 75 y.o. female who fell on her stairs at home with resulting Lt hip fracture. Pt is now s/p IM nail on 03/05/16. PMH: osteoporosis, anemia, colostomy, Crohn disease.     PT Comments    Pt initially very agreeable and pleasant with PT, but over the course of the session pt's anxiety continues to rise and at times pt is paranoid and questioning intentions of other staff members that enter room.  Pt questioning abilities of all staff, but states that she thinks she can trust PT as she feels this PT is "not on their side" indicating nsg and other hospital staff.  PT in room for 61 mins and pt unable to come to full sitting at EOB and refuses to allow PT to A.  Pt with poor attention and often stares off needing cues to re-attend to task.  Question if pt would benefit from Psych eval at SNF in order to A pt with participating in her therapy and overall care.  Strongly feel SNF is the only safe D/C venue for pt.    Follow Up Recommendations  SNF     Equipment Recommendations  Rolling walker with 5" wheels;3in1 (PT)    Recommendations for Other Services       Precautions / Restrictions Precautions Precautions: Fall Precaution Comments: colostomy Restrictions Weight Bearing Restrictions: Yes LLE Weight Bearing: Weight bearing as tolerated    Mobility  Bed Mobility Overal bed mobility: Needs Assistance Bed Mobility: Supine to Sit;Sit to Supine     Supine to sit: Supervision Sit to supine: Supervision   General bed mobility comments: pt unable to come to full sitting at EOB.  With max slow, gentle step-by-step cueing pt is able to bring Bil LEs towards EOB and initiate scooting hips towards EOB.  however, once pt's attention is interrupted by another person entering the room, she was not able to complete task of coming to sitting and became  upset, tearful, and paranoid.    Transfers                    Ambulation/Gait                 Stairs            Wheelchair Mobility    Modified Rankin (Stroke Patients Only)       Balance                                    Cognition Arousal/Alertness: Awake/alert Behavior During Therapy: Anxious;Agitated (mood fluctuates throughout session.  Paranoid.) Overall Cognitive Status: No family/caregiver present to determine baseline cognitive functioning Area of Impairment: Attention;Memory;Following commands;Safety/judgement;Awareness;Problem solving   Current Attention Level: Sustained Memory: Decreased short-term memory Following Commands: Follows one step commands with increased time;Follows one step commands inconsistently Safety/Judgement: Decreased awareness of safety;Decreased awareness of deficits Awareness: Intellectual Problem Solving: Slow processing;Decreased initiation;Difficulty sequencing;Requires verbal cues;Requires tactile cues General Comments: pt at times follows directions, but then will stop and stare off into space and needs cues to re-attend to task or conversation.  pt's mood fluctuates throughout session from pleasant and conversive to paranoid and crying.      Exercises General Exercises - Lower Extremity Ankle Circles/Pumps: AROM;Left;5 reps    General Comments  Pertinent Vitals/Pain Pain Assessment: Faces Faces Pain Scale: Hurts worst Pain Location: L hip.  Tearful, but unclear if truly pain or anxiety. Pain Descriptors / Indicators: Crying Pain Intervention(s): Monitored during session;Premedicated before session;Repositioned    Home Living                      Prior Function            PT Goals (current goals can now be found in the care plan section) Acute Rehab PT Goals Patient Stated Goal: be independent again PT Goal Formulation: With patient Time For Goal Achievement:  03/20/16 Potential to Achieve Goals: Fair Progress towards PT goals: Progressing toward goals    Frequency    Min 3X/week      PT Plan Current plan remains appropriate    Co-evaluation             End of Session Equipment Utilized During Treatment: Oxygen Activity Tolerance: Patient limited by pain (Limited by anxiety/paranoia) Patient left: in bed;with call bell/phone within reach;with bed alarm set     Time: 7793-9030 PT Time Calculation (min) (ACUTE ONLY): 61 min  Charges:  $Therapeutic Activity: 53-67 mins                    G CodesCatarina Hartshorn, [place] 03/10/2016, 12:19 PM

## 2016-03-10 NOTE — Clinical Social Work Note (Addendum)
Patient reportedly not wanting to discharge due to pain. CSW met with patient. No supports at bedside. CSW explained that MD called and said she is stable for discharge. Patient told CSW that CSW was being inappropriate, did not know anything about the situation, and did not want to talk to [place]. CSW explained that once an MD deems a patient medically stable and discharges them, insurance will stop paying. Patient asked what the patient could do in this situation. CSW explained appeal process and that if the appeal rules in favor of the hospital, insurance will not cover those extra days in the hospital. Patient stated that her husband is a Chief Executive Officer and that is not how he explained it to her. Patient asked to speak with the MD. CSW paged MD.  [name], [place] [phone]  2:50 pm Patient has a private room option at the following SNFs: [facility], [facility], [facility], and [facility].  [name], CSW [phone]  2:53 pm Per MD, he spoke in detail with patient for 45 minutes today explaining that she was medically stable for discharge. He has spoken with his supervisor who advised him to put in the dc order. RNCM notified.  [name], CSW [phone]  3:44 pm Received call from RN that patient wanted information on the SNF's. CSW began to provide information and said she did not want [facility], [facility], or [facility]. Patient wanted to know the square footage of a semi private room at [facility]. Patient stated that her husband would be here soon and was going to see the facilities. Patient then asked CSW if CSW knew how to use a female urinal. CSW told her no and that CSW was not trained for that. Patient stated that CSW could figure it out. CSW again stated that CSW was not trained to use it and it could potentially affect employment. CSW went out of room to call RN.   [name], [place]

## 2016-03-11 MED ORDER — OXYCODONE-ACETAMINOPHEN 7.5-325 MG PO TABS
1.0000 | ORAL_TABLET | ORAL | Status: DC | PRN
Start: 2016-03-11 — End: 2016-03-13
  Administered 2016-03-11 – 2016-03-13 (×8): 1 via ORAL
  Filled 2016-03-11 (×8): qty 1

## 2016-03-11 NOTE — Progress Notes (Signed)
Pt refuses to have vital signs and BG done

## 2016-03-11 NOTE — Progress Notes (Signed)
Responded to pt request for pain medication. When arriving in pt room pt screaming out in pain, stating "just kill me", this is the worst pain. Explained that I had her pain med and it would help. Pt states " I can't take that now, I'm in too much pain". Again explained that it was medicine that would alleviate her discomfort and again pt refused to take med. Stating that" none of [place] understood and we were all evil". Stated that "none of [place] wanted to help her".   Reported activity to charge nurse. Charge nurse states this behavior has been ongoing.  Will attempt to again offer pain med.

## 2016-03-11 NOTE — Progress Notes (Signed)
Offered  Again  to give pt pain med and pt took med as rx'd for pain. Pt continues to state that she will report all of "you evil people".   Will observe for any additional needs. Call bell at side.

## 2016-03-11 NOTE — Clinical Social Work Note (Signed)
Clinical Social Worker continuing to follow patient and family for support and discharge planning needs.  Patient and husband have decided on [facility] (semi private room) at discharge.  Patient husband filed official appeal yesterday and the decision is still pending.  Per RNCM, appeal decision will likely come today and if denied, patient will have to discharge Saturday 10/7 to [facility].  Facility updated and agreeable with weekend admission.  Per patient husband, the plan is for discharge on Monday 10/9.  CSW to follow up on appeal decision to appropriately facilitate discharge.  Patient continues to be medically stable for discharge.  CSW remains available for support and to facilitate patient discharge needs once appeal decision has been made.  Barbette Or, [place]

## 2016-03-11 NOTE — Progress Notes (Signed)
Pt c/o of pain after percocet being administered at 2155, refuses tylenol and fentanyl per [facility]

## 2016-03-11 NOTE — Progress Notes (Signed)
PROGRESS NOTE  JOLITA HAEFNER  HYI:[id number] DOB: 10-Feb-1941 DOA: 03/05/2016 PCP: Mathews Argyle, MD Outpatient Specialists: .  Brief Narrative:  RAFEEF LAU a 75 y.o.femalewith medical history significant for complicated Crohn's disease followed at [facility]/gastrology service;recent admission for bowel obstruction status post ileostomy, chronic protein calorie malnutrition, combination iron deficiency and anemia of chronic disease followed by Dr. Julien Nordmann with hematology. Patient presents with left hip pain and inability to bear weight on left leg after experiencing a mechanical fall on 9/29. Admitted for left hip fracture, now s/p ORIF day 5. During hospital stay patient received 2 units or PRBC, H/H back to baseline. Patient has been difficult to control pain. But now has improved. Patient need PT after surgical procedure.  Subjective: Patient appeal discharge. Had long discussion with the patient regarding her benefit of going to SNF. Patient seems to be depress, refuses psychiatric evaluation. Hospital husband concerned about pain medication being too strong making her disoriented.    Assessment & Plan:  Hip fracture  -Patient presents with left intertrochanteric hip fracture after mechanical fall. -Status post ORIF with intramedullary intertrochanteric nail done by Dr. Stann Mainland on 03/05/16. -DVT prophylaxis: with Lovenox. -Pain medication adjusted - start Percocet 7.5 mg every 4 when necessary -Awaiting for placement   Anemia - likely due to blood loss and hemodilution - s/p 2 PRBC, Hb 10.6 -Has history of iron deficiency anemia and anemia of chronic disease. -Monitor CBC in a.m. 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 -Patient reports underwent creation of ileostomy in August of this year after admission for  bowel obstruction; procedure was done at [place] she has a variety of output from her stoma: 6/mushy versus watery with no particular pattern -No longer on medications for Crohn's although states her primary gastroenterologist is planning on starting her on Stelara; not on steroids as well -Currently no abdominal pain  Protein calorie malnutrition  -Patient reports that she does not like protein supplementation shakes  Lung nodule -Incidental finding on plain x-ray with radiologist recommending CT scan to better clarify - Can be done as outpatient    Peripheral neuropathy -Patient reports she developed after taking medication for Crohn's disease  DVT prophylaxis: Lovenox Code Status: Full Code Family Communication:  Husband at bedside Disposition Plan: SNF, patient appeal discharge awaiting for decision  Consultants:   Stann Mainland, ortho  Procedures:   Intramedullary nail  Antimicrobials:   Perioperative antibiotics   Objective: Vitals:   03/10/16 1100 03/10/16 2021 03/11/16 0504 03/11/16 1333  BP: (!) 130/52 (!) 132/51 128/61 (!) 146/56  Pulse: 83 94 91 (!) 102  Resp: 17 18 16 17   Temp:  100.2 F (37.9 C) 99.4 F (37.4 C) (!) 100.4 F (38 C)  TempSrc:  Oral Oral Oral  SpO2: 100% 96% 100% 99%  Weight:      Height:        Intake/Output Summary (Last 24 hours) at 03/11/16 1808 Last data filed at 03/11/16 1300  Gross per 24 hour  Intake              570 ml  Output              450 ml  Net              120 ml   Filed Weights   03/07/16 1437  Weight: 35.4 kg (78 lb)    Examination: General exam: Appears calm and comfortable  Respiratory system: Clear to auscultation. Respiratory effort normal. Cardiovascular system: S1 & S2 heard, RRR. No JVD, murmurs, rubs, gallops or clicks. No pedal edema.. Skin: No lesions or ulcers Psychiatry: Judgement and insight appear impair. Mood & affect flat  Data Reviewed: I have personally reviewed following labs and  imaging studies  CBC:  Recent Labs Lab 03/05/16 1150 03/06/16 0703 03/07/16 0249 03/08/16 0621 03/09/16 0629  WBC 8.8 9.0 6.7 5.6 6.1  NEUTROABS 7.3  --   --   --   --   HGB 11.4* 9.0* 7.5* 6.3* 10.6*  HCT 36.1 27.9* 23.4* 19.1* 31.6*  MCV 98.6 97.6 98.3 97.0 90.3  PLT 155 125* 126* 128* 425*   Basic Metabolic Panel:  Recent Labs Lab 03/05/16 1150 03/06/16 0703 03/07/16 0249 03/08/16 0621 03/08/16 2254 03/09/16 0629  NA 131* 136 136 134*  --  135  K 3.6 3.8 3.2* 3.5 3.2* 3.6  CL 96* 101 105 107  --  102  CO2 26 25 25 23   --  26  GLUCOSE 97 96 116* 90  --  100*  BUN 21* 21* 19 16  --  18  CREATININE 0.85 1.03* 0.78 0.66  --  0.70  CALCIUM 9.2 8.0* 8.1* 8.1*  --  8.5*   GFR: Estimated Creatinine Clearance: 34.5 mL/min (by C-G formula based on SCr of 0.7 mg/dL). Liver Function Tests: No results for input(s): AST, ALT, ALKPHOS, BILITOT, PROT, ALBUMIN in the last 168 hours. No results for input(s): LIPASE, AMYLASE in the last 168 hours. No results for input(s): AMMONIA in the last 168 hours. Coagulation Profile:  Recent Labs Lab 03/05/16 1150  INR 1.09   Cardiac Enzymes: No results for input(s): CKTOTAL, CKMB, CKMBINDEX, TROPONINI in the last 168 hours. BNP (last 3 results) No results for input(s): PROBNP in the last 8760 hours. HbA1C: No results for input(s): HGBA1C in the last 72 hours. CBG: No results for input(s): GLUCAP in the last 168 hours. Lipid Profile: No results for input(s): CHOL, HDL, LDLCALC, TRIG, CHOLHDL, LDLDIRECT in the last 72 hours. Thyroid Function Tests: No results for input(s): TSH, T4TOTAL, FREET4, T3FREE, THYROIDAB in the last 72 hours. Anemia Panel: No results for input(s): VITAMINB12, FOLATE, FERRITIN, TIBC, IRON, RETICCTPCT in the last 72 hours. Urine analysis:    Component Value Date/Time   COLORURINE YELLOW 03/06/2016 1531   APPEARANCEUR CLEAR 03/06/2016 1531   LABSPEC 1.025 03/06/2016 1531   PHURINE 5.0 03/06/2016 1531    GLUCOSEU NEGATIVE 03/06/2016 1531   HGBUR NEGATIVE 03/06/2016 1531   BILIRUBINUR NEGATIVE 03/06/2016 1531   BILIRUBINUR neg 08/22/2011 1010   KETONESUR NEGATIVE 03/06/2016 1531   PROTEINUR NEGATIVE 03/06/2016 1531   UROBILINOGEN negative 08/22/2011 1010   UROBILINOGEN 0.2 11/19/2007 0157   NITRITE NEGATIVE 03/06/2016 1531   LEUKOCYTESUR TRACE (A) 03/06/2016 1531   Sepsis Labs: @LABRCNTIP (procalcitonin:4,lacticidven:4)  )No results found for this or any previous visit (from the past 240 hour(s)).   Invalid input(s): PROCALCITONIN, [place]   Radiology Studies: No results found.   Scheduled Meds: . cholecalciferol  2,000 Units Oral Daily  . docusate sodium  100 mg Oral BID  . dorzolamide  1 drop Both Eyes BID   And  . timolol  1 drop Both Eyes BID  . enoxaparin (LOVENOX) injection  40 mg Subcutaneous Q24H  . gabapentin  300 mg Oral TID  . latanoprost  1 drop Both Eyes QHS  .  mouth rinse  15 mL Mouth Rinse BID   Continuous Infusions: . 0.9 % NaCl with KCl 40 mEq / L 10 mL/hr at 03/10/16 0000     LOS: 6 days    Chipper Oman, MD [facility] Pager [phone]  If 7PM-7AM, please contact night-coverage [email or web address] Password [email or web address] 03/11/2016, 6:08 PM

## 2016-03-11 NOTE — Progress Notes (Signed)
Pt refusing to take bedtime medications at this time. Pt states she does not want to take the medications at this time and goes to sleep.

## 2016-03-12 LAB — BASIC METABOLIC PANEL
ANION GAP: 11 (ref 5–15)
BUN: 19 mg/dL (ref 6–20)
CO2: 28 mmol/L (ref 22–32)
Calcium: 9.5 mg/dL (ref 8.9–10.3)
Chloride: 98 mmol/L — ABNORMAL LOW (ref 101–111)
Creatinine, Ser: 0.77 mg/dL (ref 0.44–1.00)
GFR calc Af Amer: >60 mL/min (ref >60)
GFR calc non Af Amer: >60 mL/min (ref >60)
GLUCOSE: 98 mg/dL (ref 65–99)
Potassium: 4.4 mmol/L (ref 3.5–5.1)
Sodium: 137 mmol/L (ref 135–145)

## 2016-03-12 LAB — CBC WITH DIFFERENTIAL/PLATELET
BASOS ABS: 0 10*3/uL (ref 0.0–0.1)
Basophils Relative: 0 %
Eosinophils Absolute: 0.1 10*3/uL (ref 0.0–0.7)
Eosinophils Relative: 2 %
HEMATOCRIT: 34.4 % — AB (ref 36.0–46.0)
Hemoglobin: 11.2 g/dL — ABNORMAL LOW (ref 12.0–15.0)
LYMPHS ABS: 0.5 10*3/uL — AB (ref 0.7–4.0)
LYMPHS PCT: 7 %
MCH: 30.3 pg (ref 26.0–34.0)
MCHC: 32.6 g/dL (ref 30.0–36.0)
MCV: 93 fL (ref 78.0–100.0)
MONO ABS: 0.4 10*3/uL (ref 0.1–1.0)
Monocytes Relative: 6 %
NEUTROS ABS: 6.2 10*3/uL (ref 1.7–7.7)
Neutrophils Relative %: 85 %
Platelets: 262 10*3/uL (ref 150–400)
RBC: 3.7 MIL/uL — AB (ref 3.87–5.11)
RDW: 15.9 % — ABNORMAL HIGH (ref 11.5–15.5)
WBC: 7.2 10*3/uL (ref 4.0–10.5)

## 2016-03-12 NOTE — Progress Notes (Signed)
Pt refused am BS

## 2016-03-12 NOTE — Progress Notes (Signed)
PROGRESS NOTE  Kim [name]  MVE:[id number] DOB: 1941-01-14 DOA: 03/05/2016 PCP: Mathews Argyle, MD Outpatient Specialists: .  Brief Narrative:  [name] a 75 y.o.femalewith medical history significant for complicated Crohn's disease followed at [facility]/gastrology service;recent admission for bowel obstruction status post ileostomy, chronic protein calorie malnutrition, combination iron deficiency and anemia of chronic disease followed by Dr. Julien Nordmann with hematology. Patient presents with left hip pain and inability to bear weight on left leg after experiencing a mechanical fall on 9/29. Admitted for left hip fracture, now s/p ORIF day 5. During hospital stay patient received 2 units or PRBC, H/H back to baseline. Patient has been difficult to control pain. But now has improved. Patient need PT after surgical procedure.  Subjective: Patient appealled discharge. Had long discussion with the patient regarding her benefit of going to SNF. Patient more cooperative today. They have decided to go to [facility] for physical therapy. Happy with new pain medication regimen.  Assessment & Plan:  Hip fracture  -Patient presents with left intertrochanteric hip fracture after mechanical fall. -Status post ORIF with intramedullary intertrochanteric nail done by Dr. Stann Mainland on 03/05/16. -DVT prophylaxis: with Lovenox. -Pain medication adjusted - start Percocet 7.5 mg every 4 when necessary -Awaiting for placement   Anemia - likely due to blood loss and hemodilution - s/p 2 PRBC, H&H stable hemoglobin today is 11.6 -Has history of iron deficiency anemia and anemia of chronic disease. 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 -Patient reports underwent creation of ileostomy in August of this year after admission for bowel  obstruction; procedure was done at [place] she has a variety of output from her stoma: 6/mushy versus watery with no particular pattern -No longer on medications for Crohn's although states her primary gastroenterologist is planning on starting her on Stelara; not on steroids as well -Currently no abdominal pain  Protein calorie malnutrition  -Patient reports that she does not like protein supplementation shakes  Lung nodule -Incidental finding on plain x-ray with radiologist recommending CT scan to better clarify - Can be done as outpatient    Peripheral neuropathy -Patient reports she developed after taking medication for Crohn's disease  DVT prophylaxis: Lovenox Code Status: Full Code Family Communication:  Husband at bedside Disposition Plan: SNF, patient appealled discharge awaiting for decision  Consultants:   Stann Mainland, ortho  Procedures:   Intramedullary nail  Antimicrobials:   Perioperative antibiotics   Objective: Vitals:   03/12/16 0016 03/12/16 0609 03/12/16 0753 03/12/16 1348  BP: (!) 129/54 140/62 (!) 136/57 129/63  Pulse: 81 89 88 93  Resp: 15 16 16 16   Temp: 98.6 F (37 C) 98.7 F (37.1 C) 98.6 F (37 C) 99 F (37.2 C)  TempSrc: Oral Oral Oral Oral  SpO2: 98% 100% 95% 97%  Weight:      Height:        Intake/Output Summary (Last 24 hours) at 03/12/16 1542 Last data filed at 03/12/16 1515  Gross per 24 hour  Intake                0 ml  Output              250 ml  Net             -250 ml   Filed Weights   03/07/16 1437  Weight: 35.4 kg (78 lb)    Examination: General exam: Appears calm and comfortable  Respiratory system: Clear to auscultation. Respiratory effort normal. Cardiovascular system: S1 & S2 heard, RRR. No JVD, murmurs, rubs, gallops or clicks. No pedal edema.. Skin: No lesions or ulcers Psychiatry: Judgement and insight appear impair. Mood & affect flat  Data Reviewed: I have personally reviewed following labs and imaging  studies  CBC:  Recent Labs Lab 03/06/16 0703 03/07/16 0249 03/08/16 0621 03/09/16 0629 03/12/16 0419  WBC 9.0 6.7 5.6 6.1 7.2  NEUTROABS  --   --   --   --  6.2  HGB 9.0* 7.5* 6.3* 10.6* 11.2*  HCT 27.9* 23.4* 19.1* 31.6* 34.4*  MCV 97.6 98.3 97.0 90.3 93.0  PLT 125* 126* 128* 147* 973   Basic Metabolic Panel:  Recent Labs Lab 03/06/16 0703 03/07/16 0249 03/08/16 0621 03/08/16 2254 03/09/16 0629 03/12/16 0419  NA 136 136 134*  --  135 137  K 3.8 3.2* 3.5 3.2* 3.6 4.4  CL 101 105 107  --  102 98*  CO2 25 25 23   --  26 28  GLUCOSE 96 116* 90  --  100* 98  BUN 21* 19 16  --  18 19  CREATININE 1.03* 0.78 0.66  --  0.70 0.77  CALCIUM 8.0* 8.1* 8.1*  --  8.5* 9.5   GFR: Estimated Creatinine Clearance: 34.5 mL/min (by C-G formula based on SCr of 0.77 mg/dL). Liver Function Tests: No results for input(s): AST, ALT, ALKPHOS, BILITOT, PROT, ALBUMIN in the last 168 hours. No results for input(s): LIPASE, AMYLASE in the last 168 hours. No results for input(s): AMMONIA in the last 168 hours. Coagulation Profile: No results for input(s): INR, PROTIME in the last 168 hours. Cardiac Enzymes: No results for input(s): CKTOTAL, CKMB, CKMBINDEX, TROPONINI in the last 168 hours. BNP (last 3 results) No results for input(s): PROBNP in the last 8760 hours. HbA1C: No results for input(s): HGBA1C in the last 72 hours. CBG: No results for input(s): GLUCAP in the last 168 hours. Lipid Profile: No results for input(s): CHOL, HDL, LDLCALC, TRIG, CHOLHDL, LDLDIRECT in the last 72 hours. Thyroid Function Tests: No results for input(s): TSH, T4TOTAL, FREET4, T3FREE, THYROIDAB in the last 72 hours. Anemia Panel: No results for input(s): VITAMINB12, FOLATE, FERRITIN, TIBC, IRON, RETICCTPCT in the last 72 hours. Urine analysis:    Component Value Date/Time   COLORURINE YELLOW 03/06/2016 1531   APPEARANCEUR CLEAR 03/06/2016 1531   LABSPEC 1.025 03/06/2016 1531   PHURINE 5.0 03/06/2016  1531   GLUCOSEU NEGATIVE 03/06/2016 1531   HGBUR NEGATIVE 03/06/2016 1531   BILIRUBINUR NEGATIVE 03/06/2016 1531   BILIRUBINUR neg 08/22/2011 1010   KETONESUR NEGATIVE 03/06/2016 1531   PROTEINUR NEGATIVE 03/06/2016 1531   UROBILINOGEN negative 08/22/2011 1010   UROBILINOGEN 0.2 11/19/2007 0157   NITRITE NEGATIVE 03/06/2016 1531   LEUKOCYTESUR TRACE (A) 03/06/2016 1531   Sepsis Labs: @LABRCNTIP (procalcitonin:4,lacticidven:4)  )No results found for this or any previous visit (from the past 240 hour(s)).   Invalid input(s): PROCALCITONIN, [place]   Radiology Studies: No results found.   Scheduled Meds: . cholecalciferol  2,000 Units Oral Daily  . docusate sodium  100 mg Oral BID  . dorzolamide  1 drop Both Eyes BID   And  . timolol  1 drop Both Eyes BID  . enoxaparin (LOVENOX) injection  40 mg Subcutaneous Q24H  . gabapentin  300 mg Oral TID  . latanoprost  1 drop Both Eyes QHS  .  mouth rinse  15 mL Mouth Rinse BID   Continuous Infusions: . 0.9 % NaCl with KCl 40 mEq / L 10 mL/hr at 03/10/16 0000     LOS: 7 days    Chipper Oman, MD [facility] Pager [phone]  If 7PM-7AM, please contact night-coverage [email or web address] Password [email or web address] 03/12/2016, 3:42 PM

## 2016-03-13 LAB — URINALYSIS, ROUTINE W REFLEX MICROSCOPIC
Glucose, UA: NEGATIVE mg/dL
Hgb urine dipstick: NEGATIVE
KETONES UR: NEGATIVE mg/dL
NITRITE: NEGATIVE
PROTEIN: NEGATIVE mg/dL
Specific Gravity, Urine: 1.025 (ref 1.005–1.030)
pH: 5 (ref 5.0–8.0)

## 2016-03-13 LAB — URINE MICROSCOPIC-ADD ON
BACTERIA UA: NONE SEEN
RBC / HPF: NONE SEEN RBC/hpf (ref 0–5)

## 2016-03-13 MED ORDER — OXYCODONE-ACETAMINOPHEN 7.5-325 MG PO TABS: 1.0000 | ORAL_TABLET | ORAL | 0 refills | Status: DC | PRN

## 2016-03-13 NOTE — Progress Notes (Signed)
Kim [name] to be D/C'd Skilled nursing facility per MD order.  Discussed with the patient and all questions fully answered.  VSS, Skin clean, dry and intact without evidence of skin break down, no evidence of skin tears noted. IV catheter discontinued intact. Site without signs and symptoms of complications. Dressing and pressure applied.  An After Visit Summary was printed and given to the patient. Patient received prescription.  D/c education completed with patient/family including follow up instructions, medication list, d/c activities limitations if indicated, with other d/c instructions as indicated by MD - patient able to verbalize understanding, all questions fully answered.   Patient instructed to return to ED, call 911, or call MD for any changes in condition.   Patient escorted via [facility].  [name] 03/13/2016 1:10 PM

## 2016-03-13 NOTE — Clinical Social Work Note (Signed)
CSW informed by RN that pt is medically stable for discharge. CSW contacted case management to follow up on the status of pt appeal and informed that appeal was denied.   CSW contacted MD and informed that pt ready for discharge and inquired about there being any changes to the pt discharge from the original discharge date and informed there are no changes. CSW notified [place] that pt is ready to discharge to SNF and will arrive via [facility]. CSW called [facility] and arranged for transport, and requested that RN call SNF and give report.

## 2016-03-13 NOTE — Discharge Summary (Signed)
Physician Discharge Summary  Kim [name]  IWP:[id number]  DOB: [birth year]  DOA: 03/05/2016 PCP: Mathews Argyle, MD  Admit date: 03/05/2016 Discharge date: 03/13/2016  Admitted From: Home Disposition:  SNF  Recommendations for Outpatient Follow-up:  1. Follow up with PCP in 1-2 weeks 2. Please obtain BMP/CBC in one week 3. Need CT Chest for incidental lung nodule   Home Health: None Equipment/Devices: None  Discharge Condition: Stable  CODE STATUS: FULL  Diet recommendation: Heart Healthy  Brief/Interim Summary: Kim [name] a 75 y.o.femalewith medical history significant for complicated Crohn's disease followed at [facility]/gastrology service;recent admission for bowel obstruction status post ileostomy, chronic protein calorie malnutrition, combination iron deficiency and anemia of chronic disease followed by Dr. Julien Nordmann with hematology. Patient presents with left hip pain and inability to bear weight on left leg after experiencing a mechanical fall on 9/29. Admitted for left hip fracture, now s/p ORIF day 5. During hospital stay patient received 2 units or PRBC, H/H back to baseline. Patient has been difficult to control pain. But now has improved. Patient need PT after surgical procedure.    Discharge Diagnoses:   Hip fracture  -Patient presents with left intertrochanteric hip fracture after mechanical fall. -Status post ORIF with intramedullary intertrochanteric nail done by Dr. Stann Mainland on 03/05/16. -DVT prophylaxis: with Lovenox. -Pain management as needed   Anemia - likely due to blood loss and hemodilution - s/p 2 PRBC, Hb 10.6 -Has history of iron deficiency anemia and anemia of chronic disease. 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 -Patient reports underwent creation of ileostomy in August of this year after admission for bowel obstruction;procedure was done at [place] she has a variety of  output from her stoma:6/mushy versus watery with no particular pattern -No longer on medications for Crohn's although states her primary gastroenterologist is planning on starting her on Stelara; -Currently no abdominal pain  Lung nodule -Incidental finding on plain x-ray with radiologist recommending CT scan to better clarify - Can be done as outpatient   Peripheral neuropathy -Patient reports she developed after taking medication for Crohn's disease    Discharge Instructions  Discharge Instructions    Call MD for:  difficulty breathing, headache or visual disturbances    Complete by:  As directed    Call MD for:  extreme fatigue    Complete by:  As directed    Call MD for:  hives    Complete by:  As directed    Call MD for:  persistant dizziness or light-headedness    Complete by:  As directed    Call MD for:  persistant nausea and vomiting    Complete by:  As directed    Call MD for:  redness, tenderness, or signs of infection (pain, swelling, redness, odor or green/yellow discharge around incision site)    Complete by:  As directed    Call MD for:  severe uncontrolled pain    Complete by:  As directed    Call MD for:  temperature >100.4    Complete by:  As directed    Diet - low sodium heart healthy    Complete by:  As directed    Discharge instructions    Complete by:  As directed    Full weight bearing    Complete by:  As directed    Increase activity slowly    Complete by:  As directed  Medication List    STOP taking these medications   HYDROcodone-acetaminophen 5-325 MG tablet Commonly known as:  NORCO/VICODIN   oxyCODONE 5 MG immediate release tablet Commonly known as:  Oxy IR/ROXICODONE     TAKE these medications   acetaminophen 500 MG tablet Commonly known as:  TYLENOL Take 500 mg by mouth 2 (two) times daily.   cholecalciferol 1000 units tablet Commonly known as:  VITAMIN D Take 2,000 Units by mouth daily.   cyanocobalamin 1000 MCG/ML  injection Commonly known as:  (VITAMIN B-12) INJECT 1 MILLILITER INTO MUSCLE EVERY 30 DAYS   dorzolamide-timolol 22.3-6.8 MG/ML ophthalmic solution Commonly known as:  COSOPT Place 1 drop into both eyes every 12 (twelve) hours.   enoxaparin 40 MG/0.4ML injection Commonly known as:  LOVENOX Inject 0.4 mLs (40 mg total) into the skin daily.   ferrous sulfate 325 (65 FE) MG tablet Commonly known as:  FERROUSUL Take 1 tablet (325 mg total) by mouth daily with breakfast.   gabapentin 300 MG capsule Commonly known as:  NEURONTIN Take 300 mg by mouth 3 (three) times daily.   latanoprost 0.005 % ophthalmic solution Commonly known as:  XALATAN Place 1 drop into both eyes at bedtime.   Melatonin 10 MG Tbcr Take 10 mg by mouth at bedtime as needed for sleep.   ONE-A-DAY WOMENS 50+ ADVANTAGE Tabs Take 1 tablet by mouth daily.   oxyCODONE-acetaminophen 7.5-325 MG tablet Commonly known as:  PERCOCET Take 1 tablet by mouth every 4 (four) hours as needed for moderate pain.      Follow-up Information    Nicholes Stairs, MD. Schedule an appointment as soon as possible for a visit in 2 weeks.   Specialty:  Orthopedic Surgery Contact information: [address] [place] [address] [phone]          Allergies  Allergen Reactions  . Antihistamines, Chlorpheniramine-Type     Dry  Mouth, dry eyes-so dry she cannot even talk  . Metronidazole     Peripheral neuropathy  . Other Diarrhea and Nausea And Vomiting    Patient has an Ileostomy  . Sulfa Antibiotics Other (See Comments)    UNKNOWN  . Tape Other (See Comments)    SKIN WILL TEAR WITH ANYTHING OTHER THAN PAPER TAPE OR COBAN WRAP    Consultations:  Ortho Dr. Stann Mainland   Procedures/Studies: Dg Chest 1 View  Result Date: 03/05/2016 CLINICAL DATA:  Fall yesterday.  Pain. EXAM: CHEST 1 VIEW COMPARISON:  January 16, 2016 and November 19, 2007 FINDINGS: The heart, hila, and mediastinum are normal. No  pneumothorax. No mass. There is a nodular density just to the right of the heart on the frontal view. This was not seen on previous studies. No other nodules. No focal infiltrate. IMPRESSION: Nodular density projected to the right of the heart on the frontal view. While this may represent artifact or confluence of shadows, a true nodule is not excluded. Recommend CT imaging for better evaluation. No acute abnormality. Electronically Signed   By: Dorise Bullion III M.D   On: 03/05/2016 12:55   Dg Pelvis 1-2 Views  Result Date: 03/05/2016 CLINICAL DATA:  Pain after fall EXAM: PELVIS - 1-2 VIEW COMPARISON:  None. FINDINGS: There is a mildly displaced left intertrochanteric hip fracture. No other bony abnormalities. IMPRESSION: Mildly displaced left intertrochanteric hip fracture. Electronically Signed   By: Dorise Bullion III M.D   On: 03/05/2016 12:57   Ct Head Wo Contrast  Result Date: 03/05/2016 CLINICAL DATA:  Patient fell down steps and hit head. EXAM: CT HEAD WITHOUT CONTRAST TECHNIQUE: Contiguous axial images were obtained from the base of the skull through the vertex without intravenous contrast. COMPARISON:  None. FINDINGS: Brain: Soft tissue swelling/ hematoma seen over the left posterior scalp. No underlying fracture identified. No subdural, epidural, or subarachnoid hemorrhage. The cerebellum, brainstem, and basal cisterns are normal. No acute cortical ischemia or infarct no mass, mass effect, or midline shift. Ventricles and sulci are normal for age. Vascular: Calcified atherosclerosis associated with the intracranial portions of the carotid arteries. Skull: No acute fractures are seen Sinuses/Orbits: There is a tiny amount of fluid in right maxillary sinus. No associated fracture. The paranasal sinuses, mastoid air cells, and middle ears are otherwise normal. Other: No other abnormalities. IMPRESSION: No acute intracranial process. Electronically Signed   By: Dorise Bullion III M.D   On:  03/05/2016 13:30   Dg C-arm 1-60 Min  Result Date: 03/05/2016 CLINICAL DATA:  Left IM nail. EXAM: DG C-ARM 61-120 MIN; LEFT FEMUR 2 VIEWS COMPARISON:  Plain film of the left femur from earlier same day. FINDINGS: Four intraoperative fluoroscopic spot images show intra medullary nail fixation of the left femur fracture. Hardware appears intact and appropriately positioned. No evidence of surgical complicating feature. Fluoroscopy provided for 1 minutes 41 seconds. IMPRESSION: Intraoperative images showing intramedullary fixation of the left femur fracture. No evidence of surgical complicating feature. Electronically Signed   By: Franki Cabot M.D.   On: 03/05/2016 20:44   Dg Femur Min 2 Views Left  Result Date: 03/05/2016 CLINICAL DATA:  Left IM nail. EXAM: DG C-ARM 61-120 MIN; LEFT FEMUR 2 VIEWS COMPARISON:  Plain film of the left femur from earlier same day. FINDINGS: Four intraoperative fluoroscopic spot images show intra medullary nail fixation of the left femur fracture. Hardware appears intact and appropriately positioned. No evidence of surgical complicating feature. Fluoroscopy provided for 1 minutes 41 seconds. IMPRESSION: Intraoperative images showing intramedullary fixation of the left femur fracture. No evidence of surgical complicating feature. Electronically Signed   By: Franki Cabot M.D.   On: 03/05/2016 20:44   Dg Femur Min 2 Views Left  Result Date: 03/05/2016 CLINICAL DATA:  Pain after fall EXAM: LEFT FEMUR 2 VIEWS COMPARISON:  None. FINDINGS: There is an intertrochanteric fracture through the proximal left femur. No dislocation. No other fractures identified. IMPRESSION: Proximal left intertrochanteric femoral fracture. Electronically Signed   By: Dorise Bullion III M.D   On: 03/05/2016 12:56    Discharge Exam: Vitals:   03/12/16 2048 03/13/16 0540  BP: (!) 134/55 (!) 134/56  Pulse: 89 77  Resp:    Temp: 99.1 F (37.3 C) 97.6 F (36.4 C)   Vitals:   03/12/16 0753  03/12/16 1348 03/12/16 2048 03/13/16 0540  BP: (!) 136/57 129/63 (!) 134/55 (!) 134/56  Pulse: 88 93 89 77  Resp: 16 16    Temp: 98.6 F (37 C) 99 F (37.2 C) 99.1 F (37.3 C) 97.6 F (36.4 C)  TempSrc: Oral Oral Oral Oral  SpO2: 95% 97% 97% 98%  Weight:      Height:        General: Pt is alert, awake, not in acute distress Cardiovascular: RRR, S1/S2 +, no rubs, no gallops Respiratory: CTA bilaterally, no wheezing, no rhonchi Abdominal: Soft, NT, ND, bowel sounds + Extremities: no edema, no cyanosis    The results of significant diagnostics from this hospitalization (including imaging, microbiology, ancillary and laboratory) are listed below for reference.  Microbiology: No results found for this or any previous visit (from the past 240 hour(s)).   Labs: BNP (last 3 results) No results for input(s): BNP in the last 8760 hours. Basic Metabolic Panel:  Recent Labs Lab 03/07/16 0249 03/08/16 0621 03/08/16 2254 03/09/16 0629 03/12/16 0419  NA 136 134*  --  135 137  K 3.2* 3.5 3.2* 3.6 4.4  CL 105 107  --  102 98*  CO2 25 23  --  26 28  GLUCOSE 116* 90  --  100* 98  BUN 19 16  --  18 19  CREATININE 0.78 0.66  --  0.70 0.77  CALCIUM 8.1* 8.1*  --  8.5* 9.5   Liver Function Tests: No results for input(s): AST, ALT, ALKPHOS, BILITOT, PROT, ALBUMIN in the last 168 hours. No results for input(s): LIPASE, AMYLASE in the last 168 hours. No results for input(s): AMMONIA in the last 168 hours. CBC:  Recent Labs Lab 03/07/16 0249 03/08/16 0621 03/09/16 0629 03/12/16 0419  WBC 6.7 5.6 6.1 7.2  NEUTROABS  --   --   --  6.2  HGB 7.5* 6.3* 10.6* 11.2*  HCT 23.4* 19.1* 31.6* 34.4*  MCV 98.3 97.0 90.3 93.0  PLT 126* 128* 147* 262   Cardiac Enzymes: No results for input(s): CKTOTAL, CKMB, CKMBINDEX, TROPONINI in the last 168 hours. BNP: Invalid input(s): POCBNP CBG: No results for input(s): GLUCAP in the last 168 hours. D-Dimer No results for input(s): DDIMER  in the last 72 hours. Hgb A1c No results for input(s): HGBA1C in the last 72 hours. Lipid Profile No results for input(s): CHOL, HDL, LDLCALC, TRIG, CHOLHDL, LDLDIRECT in the last 72 hours. Thyroid function studies No results for input(s): TSH, T4TOTAL, T3FREE, THYROIDAB in the last 72 hours.  Invalid input(s): FREET3 Anemia work up No results for input(s): VITAMINB12, FOLATE, FERRITIN, TIBC, IRON, RETICCTPCT in the last 72 hours. Urinalysis    Component Value Date/Time   COLORURINE YELLOW 03/11/2016 0736   APPEARANCEUR CLEAR 03/11/2016 0736   LABSPEC 1.025 03/11/2016 0736   PHURINE 5.0 03/11/2016 0736   GLUCOSEU NEGATIVE 03/11/2016 0736   HGBUR NEGATIVE 03/11/2016 0736   BILIRUBINUR SMALL (A) 03/11/2016 0736   BILIRUBINUR neg 08/22/2011 1010   KETONESUR NEGATIVE 03/11/2016 0736   PROTEINUR NEGATIVE 03/11/2016 0736   UROBILINOGEN negative 08/22/2011 1010   UROBILINOGEN 0.2 11/19/2007 0157   NITRITE NEGATIVE 03/11/2016 0736   LEUKOCYTESUR SMALL (A) 03/11/2016 0736   Sepsis Labs Invalid input(s): PROCALCITONIN,  WBC,  LACTICIDVEN Microbiology No results found for this or any previous visit (from the past 240 hour(s)).   Time coordinating discharge: Over 30 minutes counseling and planing for patients discharge   SIGNED:  Chipper Oman, MD  [facility] 03/13/2016, 10:45 AM Pager   If 7PM-7AM, please contact night-coverage [email or web address] Password [email or web address]

## 2016-03-15 ENCOUNTER — Encounter (HOSPITAL_COMMUNITY): Payer: Self-pay | Admitting: Radiology

## 2016-03-15 ENCOUNTER — Encounter (HOSPITAL_COMMUNITY): Payer: Self-pay | Admitting: Anesthesiology

## 2016-03-15 ENCOUNTER — Emergency Department (HOSPITAL_COMMUNITY): Payer: Medicare Other

## 2016-03-15 ENCOUNTER — Inpatient Hospital Stay (HOSPITAL_COMMUNITY)
Admission: EM | Admit: 2016-03-15 | Discharge: 2016-03-21 | DRG: 871 | Disposition: A | Discharge status: Skilled Nursing Facility | Payer: Medicare Other | Attending: Internal Medicine | Admitting: Internal Medicine

## 2016-03-15 DIAGNOSIS — R911 Solitary pulmonary nodule: Secondary | ICD-10-CM | POA: Diagnosis present

## 2016-03-15 DIAGNOSIS — S72142D Displaced intertrochanteric fracture of left femur, subsequent encounter for closed fracture with routine healing: Secondary | ICD-10-CM | POA: Diagnosis not present

## 2016-03-15 DIAGNOSIS — F419 Anxiety disorder, unspecified: Secondary | ICD-10-CM | POA: Diagnosis present

## 2016-03-15 DIAGNOSIS — K1121 Acute sialoadenitis: Secondary | ICD-10-CM | POA: Diagnosis present

## 2016-03-15 DIAGNOSIS — K1120 Sialoadenitis, unspecified: Secondary | ICD-10-CM

## 2016-03-15 DIAGNOSIS — Z932 Ileostomy status: Secondary | ICD-10-CM | POA: Diagnosis not present

## 2016-03-15 DIAGNOSIS — A419 Sepsis, unspecified organism: Secondary | ICD-10-CM | POA: Diagnosis present

## 2016-03-15 DIAGNOSIS — Z85828 Personal history of other malignant neoplasm of skin: Secondary | ICD-10-CM | POA: Diagnosis not present

## 2016-03-15 DIAGNOSIS — Z681 Body mass index (BMI) 19 or less, adult: Secondary | ICD-10-CM

## 2016-03-15 DIAGNOSIS — H409 Unspecified glaucoma: Secondary | ICD-10-CM | POA: Diagnosis present

## 2016-03-15 DIAGNOSIS — Z79899 Other long term (current) drug therapy: Secondary | ICD-10-CM | POA: Diagnosis not present

## 2016-03-15 DIAGNOSIS — W19XXXD Unspecified fall, subsequent encounter: Secondary | ICD-10-CM | POA: Diagnosis present

## 2016-03-15 DIAGNOSIS — E876 Hypokalemia: Secondary | ICD-10-CM | POA: Diagnosis present

## 2016-03-15 DIAGNOSIS — M542 Cervicalgia: Secondary | ICD-10-CM | POA: Diagnosis present

## 2016-03-15 DIAGNOSIS — K50813 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: Secondary | ICD-10-CM | POA: Diagnosis present

## 2016-03-15 DIAGNOSIS — A318 Other mycobacterial infections: Secondary | ICD-10-CM | POA: Diagnosis present

## 2016-03-15 DIAGNOSIS — E43 Unspecified severe protein-calorie malnutrition: Secondary | ICD-10-CM | POA: Diagnosis present

## 2016-03-15 DIAGNOSIS — M1990 Unspecified osteoarthritis, unspecified site: Secondary | ICD-10-CM | POA: Diagnosis present

## 2016-03-15 DIAGNOSIS — Z9049 Acquired absence of other specified parts of digestive tract: Secondary | ICD-10-CM | POA: Diagnosis not present

## 2016-03-15 DIAGNOSIS — S72009A Fracture of unspecified part of neck of unspecified femur, initial encounter for closed fracture: Secondary | ICD-10-CM | POA: Diagnosis present

## 2016-03-15 DIAGNOSIS — E871 Hypo-osmolality and hyponatremia: Secondary | ICD-10-CM | POA: Diagnosis present

## 2016-03-15 DIAGNOSIS — R918 Other nonspecific abnormal finding of lung field: Secondary | ICD-10-CM | POA: Diagnosis present

## 2016-03-15 DIAGNOSIS — D509 Iron deficiency anemia, unspecified: Secondary | ICD-10-CM | POA: Diagnosis present

## 2016-03-15 DIAGNOSIS — M810 Age-related osteoporosis without current pathological fracture: Secondary | ICD-10-CM | POA: Diagnosis present

## 2016-03-15 DIAGNOSIS — Z87891 Personal history of nicotine dependence: Secondary | ICD-10-CM

## 2016-03-15 DIAGNOSIS — R1310 Dysphagia, unspecified: Secondary | ICD-10-CM | POA: Diagnosis present

## 2016-03-15 DIAGNOSIS — R52 Pain, unspecified: Secondary | ICD-10-CM

## 2016-03-15 DIAGNOSIS — D508 Other iron deficiency anemias: Secondary | ICD-10-CM | POA: Diagnosis not present

## 2016-03-15 LAB — CBC WITH DIFFERENTIAL/PLATELET
Basophils Absolute: 0 10*3/uL (ref 0.0–0.1)
Basophils Relative: 0 %
Eosinophils Absolute: 0 10*3/uL (ref 0.0–0.7)
Eosinophils Relative: 0 %
HCT: 37.8 % (ref 36.0–46.0)
Hemoglobin: 12.9 g/dL (ref 12.0–15.0)
Lymphocytes Relative: 2 %
Lymphs Abs: 0.3 10*3/uL — ABNORMAL LOW (ref 0.7–4.0)
MCH: 30.6 pg (ref 26.0–34.0)
MCHC: 34.1 g/dL (ref 30.0–36.0)
MCV: 89.8 fL (ref 78.0–100.0)
Monocytes Absolute: 0.9 10*3/uL (ref 0.1–1.0)
Monocytes Relative: 5 %
Neutro Abs: 16 10*3/uL — ABNORMAL HIGH (ref 1.7–7.7)
Neutrophils Relative %: 93 %
Platelets: 492 10*3/uL — ABNORMAL HIGH (ref 150–400)
RBC: 4.21 MIL/uL (ref 3.87–5.11)
RDW: 15.5 % (ref 11.5–15.5)
WBC: 17.2 10*3/uL — ABNORMAL HIGH (ref 4.0–10.5)

## 2016-03-15 LAB — COMPREHENSIVE METABOLIC PANEL
ALT: 21 U/L (ref 14–54)
AST: 20 U/L (ref 15–41)
Albumin: 3 g/dL — ABNORMAL LOW (ref 3.5–5.0)
Alkaline Phosphatase: 78 U/L (ref 38–126)
Anion gap: 13 (ref 5–15)
BUN: 27 mg/dL — ABNORMAL HIGH (ref 6–20)
CO2: 22 mmol/L (ref 22–32)
Calcium: 9.6 mg/dL (ref 8.9–10.3)
Chloride: 99 mmol/L — ABNORMAL LOW (ref 101–111)
Creatinine, Ser: 0.93 mg/dL (ref 0.44–1.00)
GFR calc Af Amer: >60 mL/min (ref >60)
GFR calc non Af Amer: 59 mL/min — ABNORMAL LOW (ref >60)
Glucose, Bld: 144 mg/dL — ABNORMAL HIGH (ref 65–99)
Potassium: 3.9 mmol/L (ref 3.5–5.1)
Sodium: 134 mmol/L — ABNORMAL LOW (ref 135–145)
Total Bilirubin: 1.3 mg/dL — ABNORMAL HIGH (ref 0.3–1.2)
Total Protein: 7 g/dL (ref 6.5–8.1)

## 2016-03-15 LAB — GLUCOSE, CAPILLARY: GLUCOSE-CAPILLARY: 151 mg/dL — AB (ref 65–99)

## 2016-03-15 LAB — I-STAT CG4 LACTIC ACID, ED
LACTIC ACID, VENOUS: 1.96 mmol/L — AB (ref 0.5–1.9)
Lactic Acid, Venous: 1.29 mmol/L (ref 0.5–1.9)
Lactic Acid, Venous: 1.05 mmol/L (ref 0.5–1.9)

## 2016-03-15 LAB — MRSA PCR SCREENING: MRSA by PCR: NEGATIVE

## 2016-03-15 MED ORDER — ONDANSETRON HCL 4 MG/2ML IJ SOLN
4.0000 mg | Freq: Once | INTRAMUSCULAR | Status: AC
  Administered 2016-03-15: 4 mg via INTRAVENOUS
  Filled 2016-03-15: qty 2

## 2016-03-15 MED ORDER — SODIUM CHLORIDE 0.9 % IV BOLUS (SEPSIS)
1000.0000 mL | Freq: Once | INTRAVENOUS | Status: AC
  Administered 2016-03-15: 1000 mL via INTRAVENOUS

## 2016-03-15 MED ORDER — PIPERACILLIN-TAZOBACTAM 3.375 G IVPB 30 MIN
3.3750 g | Freq: Once | INTRAVENOUS | Status: AC
  Administered 2016-03-15: 3.375 g via INTRAVENOUS
  Filled 2016-03-15: qty 50

## 2016-03-15 MED ORDER — DORZOLAMIDE HCL-TIMOLOL MAL 2-0.5 % OP SOLN
1.0000 [drp] | Freq: Two times a day (BID) | OPHTHALMIC | Status: DC
  Administered 2016-03-15 – 2016-03-21 (×12): 1 [drp] via OPHTHALMIC
  Filled 2016-03-15 (×2): qty 10

## 2016-03-15 MED ORDER — IOPAMIDOL (ISOVUE-300) INJECTION 61%: 75.0000 mL | Freq: Once | INTRAVENOUS | Status: DC | PRN

## 2016-03-15 MED ORDER — KETAMINE HCL-SODIUM CHLORIDE 100-0.9 MG/10ML-% IV SOSY: 1.0000 mg/kg | PREFILLED_SYRINGE | Freq: Once | INTRAVENOUS | Status: DC

## 2016-03-15 MED ORDER — ACETAMINOPHEN 650 MG RE SUPP
650.0000 mg | Freq: Once | RECTAL | Status: DC
  Filled 2016-03-15: qty 1

## 2016-03-15 MED ORDER — PIPERACILLIN-TAZOBACTAM 3.375 G IVPB
3.3750 g | Freq: Three times a day (TID) | INTRAVENOUS | Status: DC
  Administered 2016-03-15 – 2016-03-21 (×17): 3.375 g via INTRAVENOUS
  Filled 2016-03-15 (×19): qty 50

## 2016-03-15 MED ORDER — MORPHINE SULFATE (PF) 2 MG/ML IV SOLN
2.0000 mg | INTRAVENOUS | Status: DC | PRN
  Administered 2016-03-15: 2 mg via INTRAVENOUS
  Administered 2016-03-15 – 2016-03-16 (×4): 4 mg via INTRAVENOUS
  Filled 2016-03-15 (×2): qty 2
  Filled 2016-03-15: qty 1
  Filled 2016-03-15 (×3): qty 2

## 2016-03-15 MED ORDER — KETOROLAC TROMETHAMINE 15 MG/ML IJ SOLN
15.0000 mg | Freq: Once | INTRAMUSCULAR | Status: AC
  Administered 2016-03-15: 15 mg via INTRAVENOUS
  Filled 2016-03-15: qty 1

## 2016-03-15 MED ORDER — ACETAMINOPHEN 325 MG PO TABS
650.0000 mg | ORAL_TABLET | Freq: Once | ORAL | Status: DC
  Filled 2016-03-15 (×2): qty 2

## 2016-03-15 MED ORDER — FENTANYL CITRATE (PF) 100 MCG/2ML IJ SOLN
12.5000 ug | INTRAMUSCULAR | Status: DC | PRN
  Administered 2016-03-15: 12.5 ug via INTRAVENOUS
  Filled 2016-03-15: qty 2

## 2016-03-15 MED ORDER — SODIUM CHLORIDE 0.9 % IV BOLUS (SEPSIS)
250.0000 mL | Freq: Once | INTRAVENOUS | Status: AC
  Administered 2016-03-15: 08:00:00 via INTRAVENOUS

## 2016-03-15 MED ORDER — HYDROMORPHONE HCL 1 MG/ML IJ SOLN: 1.0000 mg | INTRAMUSCULAR | Status: DC | PRN

## 2016-03-15 MED ORDER — SODIUM CHLORIDE 0.9 % IV SOLN: INTRAVENOUS | Status: DC

## 2016-03-15 MED ORDER — MORPHINE SULFATE (PF) 4 MG/ML IV SOLN
4.0000 mg | Freq: Once | INTRAVENOUS | Status: AC
  Administered 2016-03-15: 4 mg via INTRAVENOUS
  Filled 2016-03-15: qty 1

## 2016-03-15 MED ORDER — VANCOMYCIN HCL 500 MG IV SOLR
500.0000 mg | INTRAVENOUS | Status: DC
  Administered 2016-03-17: 500 mg via INTRAVENOUS
  Filled 2016-03-15: qty 500

## 2016-03-15 MED ORDER — ONDANSETRON HCL 4 MG/2ML IJ SOLN
4.0000 mg | Freq: Once | INTRAMUSCULAR | Status: AC
  Administered 2016-03-18: 4 mg via INTRAVENOUS
  Filled 2016-03-15: qty 2

## 2016-03-15 MED ORDER — LATANOPROST 0.005 % OP SOLN
1.0000 [drp] | Freq: Every day | OPHTHALMIC | Status: DC
  Administered 2016-03-15 – 2016-03-21 (×6): 1 [drp] via OPHTHALMIC
  Filled 2016-03-15: qty 2.5

## 2016-03-15 MED ORDER — VANCOMYCIN HCL IN DEXTROSE 1-5 GM/200ML-% IV SOLN
1000.0000 mg | Freq: Once | INTRAVENOUS | Status: AC
  Administered 2016-03-15: 1000 mg via INTRAVENOUS
  Filled 2016-03-15: qty 200

## 2016-03-15 MED ORDER — PROPOFOL 10 MG/ML IV BOLUS: 1.0000 mg/kg | Freq: Once | INTRAVENOUS | Status: DC

## 2016-03-15 MED ORDER — SODIUM CHLORIDE 0.9 % IV SOLN
INTRAVENOUS | Status: DC
  Administered 2016-03-16 – 2016-03-18 (×4): via INTRAVENOUS
  Administered 2016-03-19: 1000 mL via INTRAVENOUS
  Administered 2016-03-19 – 2016-03-21 (×3): via INTRAVENOUS

## 2016-03-15 NOTE — Consult Note (Signed)
Reason for Consult:airway compromise and neck swelling Referring Physician: er  Kim [name] is an 75 y.o. female.  HPI: hxof recent admission to [facility] and discharged 2 days ago. She has extensive medical history of fracture and intestinal issues. She has had a total colectomy for Crohn's disease. Since discharge from [facility] she has had progressive swelling of the left face and discomfort. She started having some difficulty of with increased swelling and was brought to the hospital by ambulance. The emergency room called for a elective intubation. Critical care medicine has seen the patient. She has not had any stridor. Her voice is been normal. She has a lot of discomfort in the left face and neck. General when a CT scan which showed no abscess but extensive swelling of the parotid and submandibular glands extending into the parapharyngeal space.  Past Medical History:  Diagnosis Date  . Anemia    related to malabsorption--gets B12 shots and iron infusions, managed by Dr. Julien Nordmann  . BCC (basal cell carcinoma of skin)   . Crohn disease ([place])    Dr. Emelda Fear at [facility]  . Glaucoma    Dr. Janyth Contes  . Intestinal malabsorption   . OA (osteoarthritis)    hands,back  . Osteoporosis    managed by Dr. Nevada Crane at [facility], [place] q2 yrs    Past Surgical History:  Procedure Laterality Date  . Crohn  2009  . internal ostomy pouch  1994  . INTRAMEDULLARY (IM) NAIL INTERTROCHANTERIC Left 03/05/2016   Procedure: INTRAMEDULLARY (IM) NAIL INTERTROCHANTRIC;  Surgeon: Nicholes Stairs, MD;  Location: [facility];  Service: Orthopedics;  Laterality: Left;  . TOTAL COLECTOMY  1984   due to Crohn's    Family History  Problem Relation Age of Onset  . Diabetes Mother     borderline  . Heart disease Mother     MI  . Heart disease Father   . Thyroid disease Sister     Social History:  reports that she quit smoking about 43 years ago. She has never used smokeless tobacco. She reports that she does not  drink alcohol or use drugs.  Allergies:  Allergies  Allergen Reactions  . Antihistamines, Chlorpheniramine-Type     Dry  Mouth, dry eyes-so dry she cannot even talk  . Metronidazole     Peripheral neuropathy  . Other Diarrhea and Nausea And Vomiting    Patient has an Ileostomy  . Sulfa Antibiotics Other (See Comments)    UNKNOWN  . Tape Other (See Comments)    SKIN WILL TEAR WITH ANYTHING OTHER THAN PAPER TAPE OR COBAN WRAP    Medications: I have reviewed the patient's current medications.  Results for orders placed or performed during the hospital encounter of 03/15/16 (from the past 48 hour(s))  Comprehensive metabolic panel     Status: Abnormal   Collection Time: 03/15/16  7:20 AM  Result Value Ref Range   Sodium 134 (L) 135 - 145 mmol/L   Potassium 3.9 3.5 - 5.1 mmol/L   Chloride 99 (L) 101 - 111 mmol/L   CO2 22 22 - 32 mmol/L   Glucose, Bld 144 (H) 65 - 99 mg/dL   BUN 27 (H) 6 - 20 mg/dL   Creatinine, Ser 0.93 0.44 - 1.00 mg/dL   Calcium 9.6 8.9 - 10.3 mg/dL   Total Protein 7.0 6.5 - 8.1 g/dL   Albumin 3.0 (L) 3.5 - 5.0 g/dL   AST 20 15 - 41 U/L   ALT 21 14 -  54 U/L   Alkaline Phosphatase 78 38 - 126 U/L   Total Bilirubin 1.3 (H) 0.3 - 1.2 mg/dL   GFR calc non Af Amer 59 (L) >60 mL/min   GFR calc Af Amer >60 >60 mL/min    Comment: (NOTE) The eGFR has been calculated using the CKD EPI equation. This calculation has not been validated in all clinical situations. eGFR's persistently <60 mL/min signify possible Chronic Kidney Disease.    Anion gap 13 5 - 15  CBC with Differential     Status: Abnormal   Collection Time: 03/15/16  7:20 AM  Result Value Ref Range   WBC 17.2 (H) 4.0 - 10.5 K/uL   RBC 4.21 3.87 - 5.11 MIL/uL   Hemoglobin 12.9 12.0 - 15.0 g/dL   HCT 37.8 36.0 - 46.0 %   MCV 89.8 78.0 - 100.0 fL   MCH 30.6 26.0 - 34.0 pg   MCHC 34.1 30.0 - 36.0 g/dL   RDW 15.5 11.5 - 15.5 %   Platelets 492 (H) 150 - 400 K/uL   Neutrophils Relative % 93 %    Lymphocytes Relative 2 %   Monocytes Relative 5 %   Eosinophils Relative 0 %   Basophils Relative 0 %   Neutro Abs 16.0 (H) 1.7 - 7.7 K/uL   Lymphs Abs 0.3 (L) 0.7 - 4.0 K/uL   Monocytes Absolute 0.9 0.1 - 1.0 K/uL   Eosinophils Absolute 0.0 0.0 - 0.7 K/uL   Basophils Absolute 0.0 0.0 - 0.1 K/uL  I-Stat CG4 Lactic Acid, ED     Status: None   Collection Time: 03/15/16  7:39 AM  Result Value Ref Range   Lactic Acid, Venous 1.29 0.5 - 1.9 mmol/L  I-Stat CG4 Lactic Acid, ED  (not at  [facility])     Status: Abnormal   Collection Time: 03/15/16 10:28 AM  Result Value Ref Range   Lactic Acid, Venous 1.96 (HH) 0.5 - 1.9 mmol/L    Dg Chest 2 View  Result Date: 03/15/2016 CLINICAL DATA:  New onset fever, left-sided facial and neck swelling and redness, possible sepsis. Remote history of smoking. EXAM: CHEST  2 VIEW COMPARISON:  Chest x-ray of March 05, 2016 FINDINGS: The lungs are mildly hyperinflated. There remain coarse lung markings in the right infrahilar region medially. There are no air bronchograms. There is no pleural effusion or pneumothorax. The heart and pulmonary vascularity are normal. The bony thorax exhibits no acute abnormality. IMPRESSION: Persistent increased density in the right infrahilar region. Probable COPD. No pneumonia nor CHF. Chest CT scanning is again recommended for further evaluation of the suspected nodule in the right infrahilar region. Electronically Signed   By: David  Martinique M.D.   On: 03/15/2016 08:14   Ct Soft Tissue Neck W Contrast  Result Date: 03/15/2016 CLINICAL DATA:  75 year old female with jaw swelling, pain, fever, redness of the left face and neck. Initial encounter. EXAM: CT NECK WITH CONTRAST TECHNIQUE: Multidetector CT imaging of the neck was performed using the standard protocol following the bolus administration of intravenous contrast. CONTRAST:  100 mL Isovue 370 COMPARISON:  Head CT without contrast 03/05/2016. FINDINGS: Pharynx and larynx:  Negative larynx. Moderate severe left parapharyngeal space inflammation and soft tissue swelling. Generalized left lateral pharyngeal wall soft tissue swelling/edema. Associated retropharyngeal effusion. The inflammation extends from the superior left parapharyngeal space and nasopharynx through to the left piriform sinus. The right lateral pharynx and right parapharyngeal space are normal. Salivary glands: Negative sublingual space, but moderate to  severe inflammation of the left parotid and submandibular gland with confluent inflammation tracking from the left parotid space through to the left submandibular space, in association with the widespread left parapharyngeal space edema. Superimposed left lateral face and neck subcutaneous edema and skin thickening. Widespread edema/free fluid tracking in the left neck, including wrapping around the posterior left neck to the midline. There is edema within the left sternocleidomastoid muscle. No organized or drainable fluid collection. The inflamed left parotid and submandibular glands are hyper enhancing. There is no enlargement of the left submandibular duct. The left parotid duct is mostly obscured by dental streak artifact. There is a punctate sialolith along the anterior left submandibular gland on series 2, image 52. No other sialolithiasis identified. The right submandibular and right parotid glands are normal. Thyroid: Negative thyroid. Lymph nodes:  No right cervical lymphadenopathy. Left side cervical lymph nodes from the level 1 to level 3 stations are hyper enhancing but remain small, 5-6 mm short axis and smaller. No enlarged nodes. No cystic or necrotic nodes. Vascular: The right IJ is dominant and patent. Non dominant and primarily drains the left face. The left EJ is patent. Bilateral major vascular structures in the neck and at the skullbase are patent. Limited intracranial: Negative. Visualized orbits: Negative, postoperative changes to both globes.  Mastoids and visualized paranasal sinuses: Visualized paranasal sinuses and mastoids are stable and well pneumatized. Skeleton: The mandible is intact. The left mandible appears normal. No acute dental changes identified. Degenerative changes in the cervical spine. No acute osseous abnormality identified. Upper chest: Normal visualized superior mediastinum. Negative lung apices aside from mild right upper lobe bronchiectasis. No axillary lymphadenopathy. Other: None. IMPRESSION: 1. Severe trans-spatial inflammation affecting the left face and neck. The source appears to be acute inflammation of both the left parotid and submandibular glands. Favor acute infectious sialoadenitis. 2. Inflammation and edema tracking throughout the superficial and deep left neck including the left parapharyngeal, retropharyngeal spaces and diffusely involving the left lateral pharyngeal mucosal space. 3. No airway compromise at this time. No drainable fluid collection or abscess. No associated vascular thrombosis. 4. Small reactive lymph nodes throughout the left level 1 to level 3 nodal stations. 5. No involvement of the upper chest. Electronically Signed   By: Genevie Ann M.D.   On: 03/15/2016 09:18    ROS Blood pressure 135/59, pulse 102, temperature 101.5 F (38.6 C), temperature source Axillary, resp. rate 21, SpO2 99 %. Physical Exam  Constitutional:  Thin and weak. No stridor. Voice normal.   HENT:  Head: Normocephalic.  Mouth excessively dry.there is some trismus.  Swelling of the uvula and left soft palate. FOE- nasopharynx clear. The nose without pus. Epiglottis deviated to the right slightly from the left swelling and swelling on the lateral pharyngeal wall on the left. The base of tongue is normal except some watery edema in the left vallecula.  The glottis is easily visible and the cords move well. Neck with erythema of the skin of the left and the left parotid is significantly swollen and tender.   Eyes:  Conjunctivae are normal. Pupils are equal, round, and reactive to light.  Neck: Normal range of motion.    Assessment/Plan: Left parotitis and airway swelling-fiberoptic exam reveals the glottis is easily visualized vocal cords move normally. There is some swelling that deviates the epiglottis slightly to the right but overall her airway seems stable. She's had no respiratory distress of stridor. Because otolaryngology surgery is not performed at this hospital she would  be much safer to be transferred to [facility] if any intervention is necessary. I have talked to the husband who is dissatisfied with [facility]'s care for her 2 days ago because he did not think she should be discharged. He agrees that he wants her in the safest location and he does not want to transfer her to [facility] or [facility] She should be admitted to the critical care medicine service in the stepdown or intensive care unit. She needs hydration as her mouth is excessively dry and likely the source of her parotitis. Intravenous antibiotics covering MRSA as well as gram-negative's would be best. Hopefully elective intubation is not necessary as this problem does not progress and begins to resolve. I do not think currently an elective intubation is necessary based on her examination and her status.  [name] 03/15/2016, 11:44 AM

## 2016-03-15 NOTE — ED Provider Notes (Signed)
[facility] DEPT Provider Note   CSN: [id number] Arrival date & time: 03/15/16  1941     History   Chief Complaint Chief Complaint  Patient presents with  . Facial Swelling    HPI Kim [name] is a 75 y.o. female.  75 year old Caucasian female past medical history significant for Crohn's disease status post colectomy and left hip replacement on 03/05/2016 presents to the ED today with worsening left sided facial and neck swelling. Patient is currently by EMS from nursing facility. Patient states the past 3 days the left side of her face has become increasingly painful, erythematous, edematous. Patient denies any trauma. The nursing facility sent patient to the ER to workup for acute parodontitis. Patient is unable to completely open her mouth due to the pain. Talking or moving her jaw makes the pain worse. Nothing makes the pain better. She was noted to be febrile and tachycardic at nursing facility which is why they called EMS. Patient states the pain is constant. Describes the pain as the entire left side of her face. Patient denies any headache, vision changes, dizziness, lightheadedness, chest pain, shortness of breath, abdominal pain, nausea, vomiting, urinary symptoms, change in bowel habits, numbness/tingling.         Past Medical History:  Diagnosis Date  . Anemia    related to malabsorption--gets B12 shots and iron infusions, managed by Dr. Julien Nordmann  . BCC (basal cell carcinoma of skin)   . Crohn disease ([place])    Dr. Emelda Fear at [facility]  . Glaucoma    Dr. Janyth Contes  . Intestinal malabsorption   . OA (osteoarthritis)    hands,back  . Osteoporosis    managed by Dr. Nevada Crane at [facility], [place] q2 yrs    Patient Active Problem List   Diagnosis Date Noted  . Protein-calorie malnutrition, severe 03/10/2016  . Hip fracture ([place]) 03/05/2016  . Protein calorie malnutrition ([place]) 03/05/2016  . Acute hyponatremia 03/05/2016  . Lung nodule 03/05/2016  . Osteoporosis 03/18/2013  .  Glaucoma 03/18/2013  . 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 OA (osteoarthritis)   . Anemia of chronic disease 06/07/2012  . Iron deficiency anemia 05/10/2011    Past Surgical History:  Procedure Laterality Date  . Crohn  2009  . internal ostomy pouch  1994  . INTRAMEDULLARY (IM) NAIL INTERTROCHANTERIC Left 03/05/2016   Procedure: INTRAMEDULLARY (IM) NAIL INTERTROCHANTRIC;  Surgeon: Nicholes Stairs, MD;  Location: [facility];  Service: Orthopedics;  Laterality: Left;  . TOTAL COLECTOMY  1984   due to Crohn's    OB History    No data available       Home Medications    Prior to Admission medications   Medication Sig Start Date End Date Taking? Authorizing Provider  acetaminophen (TYLENOL) 500 MG tablet Take 500 mg by mouth 2 (two) times daily.     Historical Provider, MD  cholecalciferol (VITAMIN D) 1000 UNITS tablet Take 2,000 Units by mouth daily.     Historical Provider, MD  cyanocobalamin (,VITAMIN B-12,) 1000 MCG/ML injection INJECT 1 MILLILITER INTO MUSCLE EVERY 30 DAYS Patient not taking: Reported on 03/05/2016 05/01/15   Curt Bears, MD  dorzolamide-timolol (COSOPT) 22.3-6.8 MG/ML ophthalmic solution Place 1 drop into both eyes every 12 (twelve) hours.     Historical Provider, MD  enoxaparin (LOVENOX) 40 MG/0.4ML injection Inject 0.4 mLs (40 mg total) into the skin daily. 03/05/16 04/04/16  Nicholes Stairs, MD  ferrous sulfate (FERROUSUL) 325 (65  FE) MG tablet Take 1 tablet (325 mg total) by mouth daily with breakfast. 03/10/16   Doreatha Lew, MD  gabapentin (NEURONTIN) 300 MG capsule Take 300 mg by mouth 3 (three) times daily.  08/09/13   Historical Provider, MD  latanoprost (XALATAN) 0.005 % ophthalmic solution Place 1 drop into both eyes at bedtime.  03/07/12   Historical Provider, MD  Melatonin 10 MG TBCR Take 10 mg by mouth at bedtime as needed for sleep.    Historical Provider, MD  Multiple Vitamins-Minerals (ONE-A-DAY  WOMENS 50+ ADVANTAGE) TABS Take 1 tablet by mouth daily.    Historical Provider, MD  oxyCODONE-acetaminophen (PERCOCET) 7.5-325 MG tablet Take 1 tablet by mouth every 4 (four) hours as needed for moderate pain. 03/13/16   Doreatha Lew, MD    Family History Family History  Problem Relation Age of Onset  . Diabetes Mother     borderline  . Heart disease Mother     MI  . Heart disease Father   . Thyroid disease Sister     Social History Social History  Substance Use Topics  . Smoking status: Former Smoker    Quit date: 06/06/1972  . Smokeless tobacco: Never Used  . Alcohol use No     Allergies   Antihistamines, chlorpheniramine-type; Metronidazole; Other; Sulfa antibiotics; and Tape   Review of Systems Review of Systems  Constitutional: Positive for chills and fever.  HENT: Positive for facial swelling. Negative for congestion, ear pain, rhinorrhea, sore throat and voice change.   Eyes: Negative for pain and visual disturbance.  Respiratory: Negative for cough and shortness of breath.   Cardiovascular: Negative for chest pain, palpitations and leg swelling.  Gastrointestinal: Negative for abdominal pain, nausea and vomiting.  Genitourinary: Negative for dysuria, flank pain, frequency, hematuria and urgency.  Musculoskeletal: Negative for arthralgias and back pain.  Skin: Negative for color change and rash.  Neurological: Negative for dizziness, syncope, weakness, light-headedness, numbness and headaches.  All other systems reviewed and are negative.    Physical Exam Updated Vital Signs BP 141/70 (BP Location: Left Arm)   Pulse (!) 127   Temp 101.5 F (38.6 C) (Axillary)   SpO2 95%   Physical Exam  Constitutional: She is oriented to person, place, and time. She appears well-developed and well-nourished. No distress.  Patient able to talk in complete sentences. Voiced his softened due to inability to completely open jaw.  HENT:  Head: Normocephalic and  atraumatic.    Right Ear: Tympanic membrane, external ear and ear canal normal.  Nose: Right sinus exhibits no maxillary sinus tenderness and no frontal sinus tenderness. Left sinus exhibits no maxillary sinus tenderness and no frontal sinus tenderness.  Mouth/Throat: Uvula is midline. Mucous membranes are dry. There is trismus in the jaw.  Unable to visualize left TM due to edema. Auricle and tragus are erythematous and ttp. No mastoid tenderness. Unable to visualize oropharynx due to inability to completely open mouth. Trismus is present. Patient with slightly muffled voice but likeley due to pain. Patient is talking in complete sentences and protecting her airway.  Eyes: Conjunctivae and EOM are normal. Pupils are equal, round, and reactive to light. Right eye exhibits no discharge. Left eye exhibits no discharge. No scleral icterus.  Neck: No thyromegaly present.  Left lateral neck is edematous and erythematous with induration. Edema over the left parotid gland. Limited ROM due to pain.   Cardiovascular: Normal rate, regular rhythm, normal heart sounds and intact distal pulses.  Exam reveals  no gallop and no friction rub.   No murmur heard. Pulmonary/Chest: Effort normal and breath sounds normal. No respiratory distress. She has no wheezes.  Patient is not hypoxic and no stridor noted.  Abdominal: Soft. Bowel sounds are normal. She exhibits no distension. There is no tenderness.  Musculoskeletal: Normal range of motion.  Lymphadenopathy:    She has no cervical adenopathy.  Neurological: She is alert and oriented to person, place, and time.  Skin: Skin is warm and dry. Capillary refill takes less than 2 seconds.  Vitals reviewed.    ED Treatments / Results  Labs (all labs ordered are listed, but only abnormal results are displayed) Labs Reviewed  CULTURE, BLOOD (ROUTINE X 2)  CULTURE, BLOOD (ROUTINE X 2)  URINE CULTURE  COMPREHENSIVE METABOLIC PANEL  CBC WITH DIFFERENTIAL/PLATELET    URINALYSIS, ROUTINE W REFLEX MICROSCOPIC (NOT AT [facility])  I-STAT CG4 LACTIC ACID, ED  I-STAT CG4 LACTIC ACID, ED    EKG  EKG Interpretation None       Radiology No results found.  Procedures Procedures (including critical care time)  Medications Ordered in ED Medications  sodium chloride 0.9 % bolus 1,000 mL (1,000 mLs Intravenous New Bag/Given 03/15/16 0727)    And  sodium chloride 0.9 % bolus 250 mL ( Intravenous New Bag/Given 03/15/16 0744)  piperacillin-tazobactam (ZOSYN) IVPB 3.375 g (3.375 g Intravenous New Bag/Given 03/15/16 0743)  vancomycin (VANCOCIN) IVPB 1000 mg/200 mL premix (1,000 mg Intravenous New Bag/Given 03/15/16 0743)  ondansetron (ZOFRAN) injection 4 mg (4 mg Intravenous Given 03/15/16 0743)  morphine 4 MG/ML injection 4 mg (4 mg Intravenous Given 03/15/16 0742)     Initial Impression / Assessment and Plan / ED Course  I have reviewed the triage vital signs and the nursing notes.  Pertinent labs & imaging results that were available during my care of the patient were reviewed by me and considered in my medical decision making (see chart for details).  Clinical Course  Patient presented to the ED with increased left lateral neck edema and erythema for the past 3 days. Patient with slightly muffled speech and trismus due to pain. She has limited movement of her neck due to swelling and pain. She was noted to be tachycardic and febrile in ED. Code sepsis was initiated. She was given broad spectrum abx including vancy and zosyn. 30cc/kg fluid bolus was initiated. Lactate was normal. She was noted to have a leukocytosis of 17.2. CT of neck revealed severe trans-spatial inflammation affecting the left face / neck with source being an acute inflammation of both the left parotid and submandibular glands, inflammation & edema tracking throughout the superficial / deep left neck, no airway compromise at this time, no drainable fluid collection / abscess, no involvement  of upper chest. Blood cultures were obtained prior to abx.  Dr. Wilson Singer saw and evaluated patient and agrees with code sepsis. He felt like the patient need elective intubation with the risk of increased swelling and airway comprise. Given patient's trismus and muffled voice. Patient was not hypoxic and without stridor. Please see his attached noted for documentation of events to get patient intubated. He spoke with critical care who agreed to [facility] at [facility]. They would [facility] ICU. ENT surgeon evaluated patient in ed and scoped patient to determine if elective intubation was needed. They believe that patient's airway was not compromised and did not need intubation. EMTALA forms were signed and patient was stable for transfer to [facility] ICU. Dr.  Kohut agrees with transfer. Critical Care Dr. Soon agreed to admit patient and assume care.   CRITICAL CARE Performed by: Ocie Cornfield   Total critical care time: 30 minutes  Critical care time was exclusive of separately billable procedures and treating other patients.  Critical care was necessary to treat or prevent imminent or life-threatening deterioration.  Critical care was time spent personally by me on the following activities: development of treatment plan with patient and/or surrogate as well as nursing, discussions with consultants, evaluation of patient's response to treatment, examination of patient, obtaining history from patient or surrogate, ordering and performing treatments and interventions, ordering and review of laboratory studies, ordering and review of radiographic studies, pulse oximetry and re-evaluation of patient's condition.       Final Clinical Impressions(s) / ED Diagnoses   Final diagnoses:  Parotiditis  Sepsis, due to unspecified organism [facility])    New Prescriptions New Prescriptions   No medications on file     Doristine Devoid, PA-C 03/16/16 1035    Virgel Manifold, MD 03/16/16  1240

## 2016-03-15 NOTE — Progress Notes (Signed)
[facility] Progress Note Patient Name: Kim [name] DOB: March 24, 1941 MRN: [id number]   Date of Service  03/15/2016  HPI/Events of Note  Pt with generalized pain, fever to 102F  eICU Interventions  toradol x 1 given. Renal fxn intact. Would not give repeat doses     Intervention Category Minor Interventions: Routine modifications to care plan (e.g. PRN medications for pain, fever)  BYRUM,ROBERT S. 03/15/2016, 3:46 PM

## 2016-03-15 NOTE — ED Triage Notes (Signed)
Per EMS, pt is from [facility] with complaints of jaw swelling and pain. EMS reports pt is alert and oriented X4.
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
## 2016-03-15 NOTE — Progress Notes (Signed)
Pt not intubated at this time. Pt has consult for ENT. Pt on 100% NRB at this time. No distress noted.

## 2016-03-15 NOTE — ED Notes (Signed)
Bed: WA03 Expected date:  Expected time:  Means of arrival:  Comments: EMS 75 yo F Jaw Swelling/fever

## 2016-03-15 NOTE — Progress Notes (Signed)
Pharmacy Antibiotic Note  Kim [name] is a 75 y.o. female admitted on 03/15/2016 with sepsis secondary to parotitis.  She was admitted 9/30>>10/8 for hip fx repair.  She returns today with compromised airway due to swelling, leukocytosis, and fever (Tm 101.71F).     Pharmacy has been consulted for Vanc & Zosyn dosing.  Plan: Vancomycin 1gm IV x1 in ED then   Vancomycin 586m IV every 24 hours.  Goal trough 15-20 mcg/mL. Zosyn 3.375g IV q8h (4 hour infusion).  Check Vancomycin trough at steady state Monitor renal function and cx data      Temp (24hrs), Avg:101.5 F (38.6 C), Min:101.5 F (38.6 C), Max:101.5 F (38.6 C)   Recent Labs Lab 03/09/16 0629 03/12/16 0419 03/15/16 0720 03/15/16 0739 03/15/16 1028  WBC 6.1 7.2 17.2*  --   --   CREATININE 0.70 0.77 0.93  --   --   LATICACIDVEN  --   --   --  1.29 1.96*    Estimated Creatinine Clearance: 29.7 mL/min (by C-G formula based on SCr of 0.93 mg/dL).    Allergies  Allergen Reactions  . Antihistamines, Chlorpheniramine-Type     Dry  Mouth, dry eyes-so dry she cannot even talk  . Metronidazole     Peripheral neuropathy  . Other Diarrhea and Nausea And Vomiting    Patient has an Ileostomy  . Sulfa Antibiotics Other (See Comments)    UNKNOWN  . Tape Other (See Comments)    SKIN WILL TEAR WITH ANYTHING OTHER THAN PAPER TAPE OR COBAN WRAP    Antimicrobials this admission: Zosyn 10/10 >>  Vanc 10/10 >>   Dose adjustments this admission:  Microbiology results: 10/10 BCx: sent 10/10 UCx:    Thank you for allowing pharmacy to be a part of this patient's care.  LBiagio Borg10/03/2016 11:47 AM

## 2016-03-15 NOTE — Progress Notes (Signed)
eLink Physician-Brief Progress Note Patient Name: Kim [name] DOB: [birth year] MRN: [id number]   Date of Service  03/15/2016  HPI/Events of Note  Admission order did not carry over - new one entered Pain control changed to morphine 2-106m q3h  eICU Interventions       Intervention Category Intermediate Interventions: Pain - evaluation and management  BYRUM,ROBERT S. 03/15/2016, 5:27 PM

## 2016-03-15 NOTE — ED Provider Notes (Signed)
Medical screening examination/treatment/procedure(s) were conducted as a shared visit with non-physician practitioner(s) and myself.  I personally evaluated the patient during the encounter.   EKG Interpretation None       75yF with significant L neck/facial swelling. Imaging as below. I'm worried about her airway. She does not have stridor and is not hypoxic, but her voice is muffled, I cannot signifcantly manipulate her neck and she has significant trismus. I felt she may need intubated for airway protection.  She was evaluated by ENT in the ED and feels this is not necessary though. Transfer to [facility].  CRITICAL CARE Performed by: Virgel Manifold Total critical care time: 35 minutes Critical care time was exclusive of separately billable procedures and treating other patients. Critical care was necessary to treat or prevent imminent or life-threatening deterioration. Critical care was time spent personally by me on the following activities: development of treatment plan with patient and/or surrogate as well as nursing, discussions with consultants, evaluation of patient's response to treatment, examination of patient, obtaining history from patient or surrogate, ordering and performing treatments and interventions, ordering and review of laboratory studies, ordering and review of radiographic studies, pulse oximetry and re-evaluation of patient's condition.    Virgel Manifold, MD 03/16/16 1353
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
## 2016-03-15 NOTE — Consult Note (Signed)
PULMONARY / CRITICAL CARE MEDICINE   Name: Kim GAGNER MRN: [id number] DOB: 1940/08/23    ADMISSION DATE:  03/15/2016 CONSULTATION DATE:  03/15/16  REFERRING MD:  Dr. Wilson Singer   CHIEF COMPLAINT:  Left neck / facial swelling  HISTORY OF PRESENT ILLNESS:  75 y/o F with PMH of anemia, glaucoma, osteoarthritis, Chron's disease s/p total colectomy (1984), internal ostomy pouch with recent sugical resection of stenosed small bowel / slipped Koch pouch (02/02/16), C-Diff colitis, & osteoporosis s/p L femur fracture with IM nail 03/05/16 with discharge to [facility] SN[facility] for rehab efforts.  Hospital course also notable for incidental finding of concern for lung nodule (on CXR).     She returns 10/10 to [facility] with reports of left sided neck pain and swelling that began 10/9.  Her husband reports she noted pain at time of discharge in left neck.  This became acutely worse on 9/10 and EMS was activated for evaluation.  The patient was found to be protecting her airway in the ER but she had muffled speech, difficulty with phonation, and limited movement of her neck.  Initial labs - NA 134, K 3.9, sr cr 0.93, glucose 144, WBC 17.2 and platelets 492.  CT evaluation of the neck demonstrated severe trans-spatial inflammation affecting the left face / neck with source being an acute inflammation of both the left parotid and submandibular glands, inflammation & edema tracking throughout the superficial / deep left neck, no airway compromise at this time, no drainable fluid collection / abscess, no involvement of upper chest.   PCCM called for evaluation.   PAST MEDICAL HISTORY :  She  has a past medical history of Anemia; BCC (basal cell carcinoma of skin); Crohn disease ([place]); Glaucoma; Intestinal malabsorption; OA (osteoarthritis); and Osteoporosis.  PAST SURGICAL HISTORY: She  has a past surgical history that includes Total colectomy (1984); internal ostomy pouch (1994); Crohn (2009); and Intramedullary (im)  nail intertrochanteric (Left, 03/05/2016).  Allergies  Allergen Reactions  . Antihistamines, Chlorpheniramine-Type     Dry  Mouth, dry eyes-so dry she cannot even talk  . Metronidazole     Peripheral neuropathy  . Other Diarrhea and Nausea And Vomiting    Patient has an Ileostomy  . Sulfa Antibiotics Other (See Comments)    UNKNOWN  . Tape Other (See Comments)    SKIN WILL TEAR WITH ANYTHING OTHER THAN PAPER TAPE OR COBAN WRAP    Current Facility-Administered Medications on File Prior to Encounter  Medication  . acetaminophen (TYLENOL) tablet 650 mg   Current Outpatient Prescriptions on File Prior to Encounter  Medication Sig  . acetaminophen (TYLENOL) 500 MG tablet Take 500 mg by mouth 2 (two) times daily.   . cholecalciferol (VITAMIN D) 1000 UNITS tablet Take 2,000 Units by mouth daily.   . cyanocobalamin (,VITAMIN B-12,) 1000 MCG/ML injection INJECT 1 MILLILITER INTO MUSCLE EVERY 30 DAYS  . dorzolamide-timolol (COSOPT) 22.3-6.8 MG/ML ophthalmic solution Place 1 drop into both eyes every 12 (twelve) hours.   . enoxaparin (LOVENOX) 40 MG/0.4ML injection Inject 0.4 mLs (40 mg total) into the skin daily.  . ferrous sulfate (FERROUSUL) 325 (65 FE) MG tablet Take 1 tablet (325 mg total) by mouth daily with breakfast.  . gabapentin (NEURONTIN) 300 MG capsule Take 300 mg by mouth 3 (three) times daily.   Marland Kitchen latanoprost (XALATAN) 0.005 % ophthalmic solution Place 1 drop into both eyes at bedtime.   . Melatonin 10 MG TBCR Take 10 mg by mouth at bedtime as needed  for sleep.  . Multiple Vitamins-Minerals (ONE-A-DAY WOMENS 50+ ADVANTAGE) TABS Take 1 tablet by mouth daily.  Marland Kitchen oxyCODONE-acetaminophen (PERCOCET) 7.5-325 MG tablet Take 1 tablet by mouth every 4 (four) hours as needed for moderate pain. (Patient taking differently: Take 1 tablet by mouth every 4 (four) hours as needed for moderate pain. Started 10/09)  . [DISCONTINUED] potassium chloride 40 MEQ/15ML (20%) LIQD Take 15 mLs (40 mEq  total) by mouth as directed.    FAMILY HISTORY:  Her indicated that her mother is deceased. She indicated that her father is deceased. She indicated that her sister is alive. She indicated that her daughter is alive. She indicated that her son is alive.    SOCIAL HISTORY: She  reports that she quit smoking about 43 years ago. She has never used smokeless tobacco. She reports that she does not drink alcohol or use drugs.  REVIEW OF SYSTEMS:  Unable to complete with patient due to neck swelling / difficultly with phonation   SUBJECTIVE:   VITAL SIGNS: BP 153/69   Pulse 110   Temp 101.5 F (38.6 C) (Axillary)   Resp (!) 27   SpO2 91%   HEMODYNAMICS:    VENTILATOR SETTINGS:    INTAKE / OUTPUT: No intake/output data recorded.  PHYSICAL EXAMINATION: General:  Frail elderly female in NAD lying in bed  Neuro:  AAOx4, speech muffled but understandable, MAE  HEENT:  MM pink/dry, left neck/facial swelling down to clavicle, erythema / warmth to touch  Cardiovascular:  s1s2 rrr, no m/r/g Lungs:  Even/non-labored, lungs bilaterally diminished but clear  Abdomen:  Soft, colostomy with green output  Musculoskeletal:  No acute deformities, left hip wound dressing c/d/i Skin:  Warm/dry, no edema   LABS:  BMET  Recent Labs Lab 03/09/16 0629 03/12/16 0419 03/15/16 0720  NA 135 137 134*  K 3.6 4.4 3.9  CL 102 98* 99*  CO2 26 28 22   BUN 18 19 27*  CREATININE 0.70 0.77 0.93  GLUCOSE 100* 98 144*    Electrolytes  Recent Labs Lab 03/09/16 0629 03/12/16 0419 03/15/16 0720  CALCIUM 8.5* 9.5 9.6    CBC  Recent Labs Lab 03/09/16 0629 03/12/16 0419 03/15/16 0720  WBC 6.1 7.2 17.2*  HGB 10.6* 11.2* 12.9  HCT 31.6* 34.4* 37.8  PLT 147* 262 492*    Coag's No results for input(s): APTT, INR in the last 168 hours.  Sepsis Markers  Recent Labs Lab 03/15/16 0739  LATICACIDVEN 1.29    ABG No results for input(s): PHART, PCO2ART, PO2ART in the last 168  hours.  Liver Enzymes  Recent Labs Lab 03/15/16 0720  AST 20  ALT 21  ALKPHOS 78  BILITOT 1.3*  ALBUMIN 3.0*    Cardiac Enzymes No results for input(s): TROPONINI, PROBNP in the last 168 hours.  Glucose No results for input(s): GLUCAP in the last 168 hours.  Imaging Dg Chest 2 View  Result Date: 03/15/2016 CLINICAL DATA:  New onset fever, left-sided facial and neck swelling and redness, possible sepsis. Remote history of smoking. EXAM: CHEST  2 VIEW COMPARISON:  Chest x-ray of March 05, 2016 FINDINGS: The lungs are mildly hyperinflated. There remain coarse lung markings in the right infrahilar region medially. There are no air bronchograms. There is no pleural effusion or pneumothorax. The heart and pulmonary vascularity are normal. The bony thorax exhibits no acute abnormality. IMPRESSION: Persistent increased density in the right infrahilar region. Probable COPD. No pneumonia nor CHF. Chest CT scanning is again recommended for  further evaluation of the suspected nodule in the right infrahilar region. Electronically Signed   By: David  Martinique M.D.   On: 03/15/2016 08:14   Ct Soft Tissue Neck W Contrast  Result Date: 03/15/2016 CLINICAL DATA:  75 year old female with jaw swelling, pain, fever, redness of the left face and neck. Initial encounter. EXAM: CT NECK WITH CONTRAST TECHNIQUE: Multidetector CT imaging of the neck was performed using the standard protocol following the bolus administration of intravenous contrast. CONTRAST:  100 mL Isovue 370 COMPARISON:  Head CT without contrast 03/05/2016. FINDINGS: Pharynx and larynx: Negative larynx. Moderate severe left parapharyngeal space inflammation and soft tissue swelling. Generalized left lateral pharyngeal wall soft tissue swelling/edema. Associated retropharyngeal effusion. The inflammation extends from the superior left parapharyngeal space and nasopharynx through to the left piriform sinus. The right lateral pharynx and right  parapharyngeal space are normal. Salivary glands: Negative sublingual space, but moderate to severe inflammation of the left parotid and submandibular gland with confluent inflammation tracking from the left parotid space through to the left submandibular space, in association with the widespread left parapharyngeal space edema. Superimposed left lateral face and neck subcutaneous edema and skin thickening. Widespread edema/free fluid tracking in the left neck, including wrapping around the posterior left neck to the midline. There is edema within the left sternocleidomastoid muscle. No organized or drainable fluid collection. The inflamed left parotid and submandibular glands are hyper enhancing. There is no enlargement of the left submandibular duct. The left parotid duct is mostly obscured by dental streak artifact. There is a punctate sialolith along the anterior left submandibular gland on series 2, image 52. No other sialolithiasis identified. The right submandibular and right parotid glands are normal. Thyroid: Negative thyroid. Lymph nodes:  No right cervical lymphadenopathy. Left side cervical lymph nodes from the level 1 to level 3 stations are hyper enhancing but remain small, 5-6 mm short axis and smaller. No enlarged nodes. No cystic or necrotic nodes. Vascular: The right IJ is dominant and patent. Non dominant and primarily drains the left face. The left EJ is patent. Bilateral major vascular structures in the neck and at the skullbase are patent. Limited intracranial: Negative. Visualized orbits: Negative, postoperative changes to both globes. Mastoids and visualized paranasal sinuses: Visualized paranasal sinuses and mastoids are stable and well pneumatized. Skeleton: The mandible is intact. The left mandible appears normal. No acute dental changes identified. Degenerative changes in the cervical spine. No acute osseous abnormality identified. Upper chest: Normal visualized superior mediastinum.  Negative lung apices aside from mild right upper lobe bronchiectasis. No axillary lymphadenopathy. Other: None. IMPRESSION: 1. Severe trans-spatial inflammation affecting the left face and neck. The source appears to be acute inflammation of both the left parotid and submandibular glands. Favor acute infectious sialoadenitis. 2. Inflammation and edema tracking throughout the superficial and deep left neck including the left parapharyngeal, retropharyngeal spaces and diffusely involving the left lateral pharyngeal mucosal space. 3. No airway compromise at this time. No drainable fluid collection or abscess. No associated vascular thrombosis. 4. Small reactive lymph nodes throughout the left level 1 to level 3 nodal stations. 5. No involvement of the upper chest. Electronically Signed   By: Genevie Ann M.D.   On: 03/15/2016 09:18     STUDIES:  CT Neck 10/10 >> severe trans-spatial inflammation affecting the left face / neck with source being an acute inflammation of both the left parotid and submandibular glands, inflammation & edema tracking throughout the superficial / deep left neck, no airway compromise  at this time, no drainable fluid collection / abscess, no involvement of upper chest.   CULTURES:   ANTIBIOTICS: Vanco 10/10 >>  Zosyn 10/10 >>   SIGNIFICANT EVENTS: 10/10  Admit with left neck pain / swelling, concern for parotid/submandibular gland acute inflammation, no abscess   LINES/TUBES:   DISCUSSION: 75 y/o F with PMH of Chron's disease, recent L hip fracture s/p IM nail (9/30) admitted 10/10 with left neck pain / swelling.  CT neck concerning for acute inflammation of both the left parotid and submandibular glands.    ASSESSMENT / PLAN:  PULMONARY A: At Risk Airway Compromise / Obstruction - patent airway noted on CT At Risk Aspiration - secondary to above  P:   Aspiration precautions  ENT evaluation of airway, concerned she may need surgical airway  O2 as needed for sats >  92% Monitor airway closely   CARDIOVASCULAR A:  Tachycardia - likely reactive in setting of infection / anxiety P:  NS @ 75 Monitor hemodynamics   RENAL A:   No acute issues  P:   Trend BMP / UOP  Replace electrolytes as indicated   GASTROINTESTINAL A:   Chron's Disease s/p Colostomy  P:   NPO for now  HEMATOLOGIC A:   Leukocytosis - secondary to parotitis  P:  Trend CBC   INFECTIOUS A:   Acute Parotitis  P:   ABX as above Trend WBC / fever curve  ENT evaluation   ENDOCRINE A:   At Risk Hypoglycemia - with NPO status P:   Monitor glucose on BMP  NEUROLOGIC A:   Pain - in setting of parotitis  P:   RASS goal: n/a Low dose PRN fentanyl    FAMILY  - Updates: Husband updated at bedside  - Inter-disciplinary family meet or Palliative Care meeting due by:  10/17    Noe Gens, NP-C [facility] Pgr: [phone] or if no answer [phone] 03/15/2016, 10:26 AM   75 yo female was in hospital recently for Lt femur fx >> went to SNF.  She developed Lt facial and neck swelling that has progressed over past 24 hrs.  Noted to have fever 101.89F and WBC 17.2.  CT neck showed Lt parotid and submandibular gland swelling with concern for infection.  She has difficulty opening her mouth due to pain.  Cachetic.  Lt face/neck area warm, red, swollen, tender.  Dry oral mucosa, minimal mouth movement.  Pupils reactive.  HR regular, tachycardic.  No stridor.  No wheeze.  Abd soft, with colostomy in place.  Decreased muscle bulk.  Lt hip dressing clean.  Assessment/plan:  Airway compromise 2nd to Lt sided sialadenitis. - ENT to assess and secure airway in OR with anesthesia assistance - continue IV fluids, IV Abx, pain medications - PCCM will f/u after OR to monitor respiratory status  Hx of Crohn's disease. Severe protein calorie malnutrition. - likely will need tube feeds after surgery  Lt hip fx. - resume lovenox when ok with ENT  Updated  pt's husband at bedside.  Chesley Mires, MD [facility] Pulmonary/Critical Care 03/15/2016, 11:37 AM Pager:  [phone] After 3pm call: [phone]

## 2016-03-15 NOTE — Progress Notes (Signed)
Patient has generalized pain to touch. Temp is 102. E-link notified. Received order for one time toradol.

## 2016-03-15 NOTE — H&P (Signed)
PULMONARY / CRITICAL CARE MEDICINE   Name: Kim [name] MRN: [id number] DOB: 1941/02/22    ADMISSION DATE:  03/15/2016 CONSULTATION DATE:  03/15/16  REFERRING MD:  Dr. Wilson Singer   CHIEF COMPLAINT:  Left neck / facial swelling  HISTORY OF PRESENT ILLNESS:  75 y/o F with PMH of anemia, glaucoma, osteoarthritis, Chron's disease s/p total colectomy (1984), internal ostomy pouch with recent sugical resection of stenosed small bowel / slipped Koch pouch (02/02/16), C-Diff colitis, & osteoporosis s/p L femur fracture with IM nail 03/05/16 with discharge to [facility] for rehab efforts.  Hospital course also notable for incidental finding of concern for lung nodule (on CXR).     She returns 10/10 to [facility] ER with reports of left sided neck pain and swelling that began 10/9.  Her husband reports she noted pain at time of discharge in left neck.  This became acutely worse on 9/10 and EMS was activated for evaluation.  The patient was found to be protecting her airway in the ER but she had muffled speech, difficulty with phonation, and limited movement of her neck.  Initial labs - NA 134, K 3.9, sr cr 0.93, glucose 144, WBC 17.2 and platelets 492.  CT evaluation of the neck demonstrated severe trans-spatial inflammation affecting the left face / neck with source being an acute inflammation of both the left parotid and submandibular glands, inflammation & edema tracking throughout the superficial / deep left neck, no airway compromise at this time, no drainable fluid collection / abscess, no involvement of upper chest.   PCCM called for evaluation.   PAST MEDICAL HISTORY :  She  has a past medical history of Anemia; BCC (basal cell carcinoma of skin); Crohn disease ([place]); Glaucoma; Intestinal malabsorption; OA (osteoarthritis); and Osteoporosis.  PAST SURGICAL HISTORY: She  has a past surgical history that includes Total colectomy (1984); internal ostomy pouch (1994); Crohn (2009); and Intramedullary (im)  nail intertrochanteric (Left, 03/05/2016).  Allergies  Allergen Reactions  . Antihistamines, Chlorpheniramine-Type     Dry  Mouth, dry eyes-so dry she cannot even talk  . Metronidazole     Peripheral neuropathy  . Other Diarrhea and Nausea And Vomiting    Patient has an Ileostomy  . Sulfa Antibiotics Other (See Comments)    UNKNOWN  . Tape Other (See Comments)    SKIN WILL TEAR WITH ANYTHING OTHER THAN PAPER TAPE OR COBAN WRAP    Current Facility-Administered Medications on File Prior to Encounter  Medication  . acetaminophen (TYLENOL) tablet 650 mg   Current Outpatient Prescriptions on File Prior to Encounter  Medication Sig  . acetaminophen (TYLENOL) 500 MG tablet Take 500 mg by mouth 2 (two) times daily.   . cholecalciferol (VITAMIN D) 1000 UNITS tablet Take 2,000 Units by mouth daily.   . cyanocobalamin (,VITAMIN B-12,) 1000 MCG/ML injection INJECT 1 MILLILITER INTO MUSCLE EVERY 30 DAYS  . dorzolamide-timolol (COSOPT) 22.3-6.8 MG/ML ophthalmic solution Place 1 drop into both eyes every 12 (twelve) hours.   . enoxaparin (LOVENOX) 40 MG/0.4ML injection Inject 0.4 mLs (40 mg total) into the skin daily.  . ferrous sulfate (FERROUSUL) 325 (65 FE) MG tablet Take 1 tablet (325 mg total) by mouth daily with breakfast.  . gabapentin (NEURONTIN) 300 MG capsule Take 300 mg by mouth 3 (three) times daily.   Marland Kitchen latanoprost (XALATAN) 0.005 % ophthalmic solution Place 1 drop into both eyes at bedtime.   . Melatonin 10 MG TBCR Take 10 mg by mouth at bedtime as needed  for sleep.  . Multiple Vitamins-Minerals (ONE-A-DAY WOMENS 50+ ADVANTAGE) TABS Take 1 tablet by mouth daily.  Marland Kitchen oxyCODONE-acetaminophen (PERCOCET) 7.5-325 MG tablet Take 1 tablet by mouth every 4 (four) hours as needed for moderate pain. (Patient taking differently: Take 1 tablet by mouth every 4 (four) hours as needed for moderate pain. Started 10/09)  . [DISCONTINUED] potassium chloride 40 MEQ/15ML (20%) LIQD Take 15 mLs (40 mEq  total) by mouth as directed.    FAMILY HISTORY:  Her indicated that her mother is deceased. She indicated that her father is deceased. She indicated that her sister is alive. She indicated that her daughter is alive. She indicated that her son is alive.    SOCIAL HISTORY: She  reports that she quit smoking about 43 years ago. She has never used smokeless tobacco. She reports that she does not drink alcohol or use drugs.  REVIEW OF SYSTEMS:  Unable to complete with patient due to neck swelling / difficultly with phonation   SUBJECTIVE:   VITAL SIGNS: BP 127/57   Pulse 103   Temp 101.5 F (38.6 C) (Axillary)   Resp 20   SpO2 100%   HEMODYNAMICS:    VENTILATOR SETTINGS:    INTAKE / OUTPUT: No intake/output data recorded.  PHYSICAL EXAMINATION: General:  Frail elderly female in NAD lying in bed  Neuro:  AAOx4, speech muffled but understandable, MAE  HEENT:  MM pink/dry, left neck/facial swelling down to clavicle, erythema / warmth to touch  Cardiovascular:  s1s2 rrr, no m/r/g Lungs:  Even/non-labored, lungs bilaterally diminished but clear  Abdomen:  Soft, colostomy with green output  Musculoskeletal:  No acute deformities, left hip wound dressing c/d/i Skin:  Warm/dry, no edema   LABS:  BMET  Recent Labs Lab 03/09/16 0629 03/12/16 0419 03/15/16 0720  NA 135 137 134*  K 3.6 4.4 3.9  CL 102 98* 99*  CO2 26 28 22   BUN 18 19 27*  CREATININE 0.70 0.77 0.93  GLUCOSE 100* 98 144*    Electrolytes  Recent Labs Lab 03/09/16 0629 03/12/16 0419 03/15/16 0720  CALCIUM 8.5* 9.5 9.6    CBC  Recent Labs Lab 03/09/16 0629 03/12/16 0419 03/15/16 0720  WBC 6.1 7.2 17.2*  HGB 10.6* 11.2* 12.9  HCT 31.6* 34.4* 37.8  PLT 147* 262 492*    Coag's No results for input(s): APTT, INR in the last 168 hours.  Sepsis Markers  Recent Labs Lab 03/15/16 0739 03/15/16 1028  LATICACIDVEN 1.29 1.96*    ABG No results for input(s): PHART, PCO2ART, PO2ART in the  last 168 hours.  Liver Enzymes  Recent Labs Lab 03/15/16 0720  AST 20  ALT 21  ALKPHOS 78  BILITOT 1.3*  ALBUMIN 3.0*    Cardiac Enzymes No results for input(s): TROPONINI, PROBNP in the last 168 hours.  Glucose No results for input(s): GLUCAP in the last 168 hours.  Imaging Dg Chest 2 View  Result Date: 03/15/2016 CLINICAL DATA:  New onset fever, left-sided facial and neck swelling and redness, possible sepsis. Remote history of smoking. EXAM: CHEST  2 VIEW COMPARISON:  Chest x-ray of March 05, 2016 FINDINGS: The lungs are mildly hyperinflated. There remain coarse lung markings in the right infrahilar region medially. There are no air bronchograms. There is no pleural effusion or pneumothorax. The heart and pulmonary vascularity are normal. The bony thorax exhibits no acute abnormality. IMPRESSION: Persistent increased density in the right infrahilar region. Probable COPD. No pneumonia nor CHF. Chest CT scanning is again  recommended for further evaluation of the suspected nodule in the right infrahilar region. Electronically Signed   By: David  Martinique M.D.   On: 03/15/2016 08:14   Ct Soft Tissue Neck W Contrast  Result Date: 03/15/2016 CLINICAL DATA:  75 year old female with jaw swelling, pain, fever, redness of the left face and neck. Initial encounter. EXAM: CT NECK WITH CONTRAST TECHNIQUE: Multidetector CT imaging of the neck was performed using the standard protocol following the bolus administration of intravenous contrast. CONTRAST:  100 mL Isovue 370 COMPARISON:  Head CT without contrast 03/05/2016. FINDINGS: Pharynx and larynx: Negative larynx. Moderate severe left parapharyngeal space inflammation and soft tissue swelling. Generalized left lateral pharyngeal wall soft tissue swelling/edema. Associated retropharyngeal effusion. The inflammation extends from the superior left parapharyngeal space and nasopharynx through to the left piriform sinus. The right lateral pharynx  and right parapharyngeal space are normal. Salivary glands: Negative sublingual space, but moderate to severe inflammation of the left parotid and submandibular gland with confluent inflammation tracking from the left parotid space through to the left submandibular space, in association with the widespread left parapharyngeal space edema. Superimposed left lateral face and neck subcutaneous edema and skin thickening. Widespread edema/free fluid tracking in the left neck, including wrapping around the posterior left neck to the midline. There is edema within the left sternocleidomastoid muscle. No organized or drainable fluid collection. The inflamed left parotid and submandibular glands are hyper enhancing. There is no enlargement of the left submandibular duct. The left parotid duct is mostly obscured by dental streak artifact. There is a punctate sialolith along the anterior left submandibular gland on series 2, image 52. No other sialolithiasis identified. The right submandibular and right parotid glands are normal. Thyroid: Negative thyroid. Lymph nodes:  No right cervical lymphadenopathy. Left side cervical lymph nodes from the level 1 to level 3 stations are hyper enhancing but remain small, 5-6 mm short axis and smaller. No enlarged nodes. No cystic or necrotic nodes. Vascular: The right IJ is dominant and patent. Non dominant and primarily drains the left face. The left EJ is patent. Bilateral major vascular structures in the neck and at the skullbase are patent. Limited intracranial: Negative. Visualized orbits: Negative, postoperative changes to both globes. Mastoids and visualized paranasal sinuses: Visualized paranasal sinuses and mastoids are stable and well pneumatized. Skeleton: The mandible is intact. The left mandible appears normal. No acute dental changes identified. Degenerative changes in the cervical spine. No acute osseous abnormality identified. Upper chest: Normal visualized superior  mediastinum. Negative lung apices aside from mild right upper lobe bronchiectasis. No axillary lymphadenopathy. Other: None. IMPRESSION: 1. Severe trans-spatial inflammation affecting the left face and neck. The source appears to be acute inflammation of both the left parotid and submandibular glands. Favor acute infectious sialoadenitis. 2. Inflammation and edema tracking throughout the superficial and deep left neck including the left parapharyngeal, retropharyngeal spaces and diffusely involving the left lateral pharyngeal mucosal space. 3. No airway compromise at this time. No drainable fluid collection or abscess. No associated vascular thrombosis. 4. Small reactive lymph nodes throughout the left level 1 to level 3 nodal stations. 5. No involvement of the upper chest. Electronically Signed   By: Genevie Ann M.D.   On: 03/15/2016 09:18     STUDIES:  CT Neck 10/10 >> severe trans-spatial inflammation affecting the left face / neck with source being an acute inflammation of both the left parotid and submandibular glands, inflammation & edema tracking throughout the superficial / deep left neck, no  airway compromise at this time, no drainable fluid collection / abscess, no involvement of upper chest.   CULTURES:   ANTIBIOTICS: Vanco 10/10 >>  Zosyn 10/10 >>   SIGNIFICANT EVENTS: 10/10  Admit with left neck pain / swelling, concern for parotid/submandibular gland acute inflammation, no abscess   LINES/TUBES:   DISCUSSION: 75 y/o F with PMH of Chron's disease, recent L hip fracture s/p IM nail (9/30) admitted 10/10 with left neck pain / swelling.  CT neck concerning for acute inflammation of both the left parotid and submandibular glands.    ASSESSMENT / PLAN:  PULMONARY A: At Risk Airway Compromise / Obstruction - patent airway noted on CT At Risk Aspiration - secondary to above  P:   Aspiration precautions  ENT evaluation of airway, concerned she may need surgical airway  O2 as needed  for sats > 92% Monitor airway closely   CARDIOVASCULAR A:  Tachycardia - likely reactive in setting of infection / anxiety P:  NS @ 75 Monitor hemodynamics   RENAL A:   No acute issues  P:   Trend BMP / UOP  Replace electrolytes as indicated   GASTROINTESTINAL A:   Chron's Disease s/p Colostomy  P:   NPO for now  HEMATOLOGIC A:   Leukocytosis - secondary to parotitis  P:  Trend CBC   INFECTIOUS A:   Acute Parotitis  P:   ABX as above Trend WBC / fever curve  ENT evaluation   ENDOCRINE A:   At Risk Hypoglycemia - with NPO status P:   Monitor glucose on BMP  NEUROLOGIC A:   Pain - in setting of parotitis  P:   RASS goal: n/a Low dose PRN fentanyl    FAMILY  - Updates: Husband updated at bedside  - Inter-disciplinary family meet or Palliative Care meeting due by:  10/17    Noe Gens, NP-C [facility] Pgr: [phone] or if no answer [phone] 03/15/2016, 12:19 PM   75 yo female was in hospital recently for Lt femur fx >> went to SNF.  She developed Lt facial and neck swelling that has progressed over past 24 hrs.  Noted to have fever 101.19F and WBC 17.2.  CT neck showed Lt parotid and submandibular gland swelling with concern for infection.  She has difficulty opening her mouth due to pain.  Cachetic.  Lt face/neck area warm, red, swollen, tender.  Dry oral mucosa, minimal mouth movement.  Pupils reactive.  HR regular, tachycardic.  No stridor.  No wheeze.  Abd soft, with colostomy in place.  Decreased muscle bulk.  Lt hip dressing clean.  Assessment/plan:  Airway compromise 2nd to Lt sided sialadenitis. - ENT to assess and secure airway in OR with anesthesia assistance - continue IV fluids, IV Abx, pain medications - PCCM will f/u after OR to monitor respiratory status  Hx of Crohn's disease. Severe protein calorie malnutrition. - likely will need tube feeds after surgery  Lt hip fx. - resume lovenox when ok with  ENT  Updated pt's husband at bedside.  Chesley Mires, MD [facility] Pulmonary/Critical Care 03/15/2016, 12:19 PM Pager:  [phone] After 3pm call: [phone]

## 2016-03-15 NOTE — Progress Notes (Signed)
Anesthesia Note Called by ED MD for fiberoptic intubation for patient with parapharyngeal swelling. I explained that I cannot do an elective or simi-elective procedure out of department with a full OR and that if she truly needs fiberoptic intubation she would need to be posted to the OR for emergent airway compromise. However, patient has not yet been evaluated by ENT. CT scan suggestive of acute infectious sialoadenitis with no obvious abscess. Some deviation of her airway to R, but per CT scan no airway compromise. I briefly examined the patient. Pt is not in respiratory distress. I did not appreciate any stridor. She does have significant left sided swelling that extends into pharyngeal space. I discussed with the ED MD that I did not feel that she warrented prophylactic fiberoptic intubation at this time and that ENT physician should evaluate her to see if she needs definitive operative therapy. Of note, she was an easy intubation (Mac 3, grade I) for her IM nail on 03/05/2016.

## 2016-03-15 NOTE — Care Management Note (Signed)
Case Management Note  Patient Details  Name: Kim [name] MRN: [id number] Date of Birth: April 24, 1941  Subjective/Objective:   Pt admitted with sialadentis  Action/Plan:  Discharged to [facility] 03/13/16 from recent hospitalization due to ORIF repair.  CSW made aware that pt is from [facility].  CM will continue to follow for discharge needs   Expected Discharge Date:                  Expected Discharge Plan:     In-House Referral:     Discharge planning Services     Post Acute Care Choice:    Choice offered to:     DME Arranged:    DME Agency:     HH Arranged:    HH Agency:     Status of Service:     If discussed at [employer] of Stay Meetings, dates discussed:    Additional Comments:  Maryclare Labrador, RN 03/15/2016, 3:23 PM

## 2016-03-15 NOTE — ED Notes (Signed)
Pt has closed rectum.  Pt stated she could take oral meds.  However, with attempt, pt noted to have painful swallowing.  Will hold med until CT resulted.  PA notified.

## 2016-03-16 DIAGNOSIS — A419 Sepsis, unspecified organism: Principal | ICD-10-CM

## 2016-03-16 LAB — BASIC METABOLIC PANEL
ANION GAP: 12 (ref 5–15)
BUN: 18 mg/dL (ref 6–20)
CALCIUM: 8.5 mg/dL — AB (ref 8.9–10.3)
CO2: 22 mmol/L (ref 22–32)
Chloride: 105 mmol/L (ref 101–111)
Creatinine, Ser: 0.8 mg/dL (ref 0.44–1.00)
GLUCOSE: 109 mg/dL — AB (ref 65–99)
POTASSIUM: 3.7 mmol/L (ref 3.5–5.1)
Sodium: 139 mmol/L (ref 135–145)

## 2016-03-16 LAB — CBC
HEMATOCRIT: 32.7 % — AB (ref 36.0–46.0)
HEMOGLOBIN: 10.4 g/dL — AB (ref 12.0–15.0)
MCH: 29.8 pg (ref 26.0–34.0)
MCHC: 31.8 g/dL (ref 30.0–36.0)
MCV: 93.7 fL (ref 78.0–100.0)
Platelets: 432 10*3/uL — ABNORMAL HIGH (ref 150–400)
RBC: 3.49 MIL/uL — AB (ref 3.87–5.11)
RDW: 16.2 % — ABNORMAL HIGH (ref 11.5–15.5)
WBC: 18.8 10*3/uL — ABNORMAL HIGH (ref 4.0–10.5)

## 2016-03-16 MED ORDER — MORPHINE SULFATE (PF) 4 MG/ML IV SOLN
2.0000 mg | INTRAVENOUS | Status: DC | PRN
  Administered 2016-03-16 – 2016-03-17 (×7): 4 mg via INTRAVENOUS
  Filled 2016-03-16 (×7): qty 1

## 2016-03-16 MED ORDER — ENOXAPARIN SODIUM 40 MG/0.4ML ~~LOC~~ SOLN
40.0000 mg | Freq: Every day | SUBCUTANEOUS | Status: DC
  Administered 2016-03-16 – 2016-03-20 (×5): 40 mg via SUBCUTANEOUS
  Filled 2016-03-16 (×5): qty 0.4

## 2016-03-16 MED ORDER — MORPHINE SULFATE (PF) 2 MG/ML IV SOLN: 2.0000 mg | INTRAVENOUS | Status: DC | PRN

## 2016-03-16 NOTE — Progress Notes (Addendum)
PULMONARY / CRITICAL CARE MEDICINE   Name: Kim [name] MRN: [id number] DOB: 06/20/1940    ADMISSION DATE:  03/15/2016 CONSULTATION DATE:  03/15/16  REFERRING MD:  Dr. Wilson Singer   CHIEF COMPLAINT:  Left neck / facial swelling  HISTORY OF PRESENT ILLNESS:  75 y/o F with PMH of anemia, glaucoma, osteoarthritis, Chron's disease s/p total colectomy (1984), internal ostomy pouch with recent sugical resection of stenosed small bowel / slipped Koch pouch (02/02/16), C-Diff colitis, & osteoporosis s/p L femur fracture with IM nail 03/05/16 with discharge to [facility] for rehab efforts.  Hospital course also notable for incidental finding of concern for lung nodule (on CXR).  She returns 10/10 to [facility] ER with reports of left sided neck pain and swelling that began 10/9.  Her husband reports she noted pain at time of discharge in left neck.  This became acutely worse on 9/10 and EMS was activated for evaluation.  The patient was found to be protecting her airway in the ER but she had muffled speech, difficulty with phonation, and limited movement of her neck.  Initial labs - NA 134, K 3.9, sr cr 0.93, glucose 144, WBC 17.2 and platelets 492.  CT evaluation of the neck demonstrated severe trans-spatial inflammation affecting the left face / neck with source being an acute inflammation of both the left parotid and submandibular glands, inflammation & edema tracking throughout the superficial / deep left neck, no airway compromise at this time, no drainable fluid collection / abscess, no involvement of upper chest. PCCM called for evaluation.   SUBJECTIVE: Patient reports no change in her voice. Still having some mild retropharyngeal pain with swallowing. Denies any dyspnea or coughing. She is continuing to have pain in her left hip post-surgery.  REVIEW OF SYSTEMS:  No fever, chills, or sweats. No chest pain or pressure. No nausea or emesis.   VITAL SIGNS: BP (!) 125/55   Pulse 82   Temp 97.7 F (36.5 C)  (Oral)   Resp 12   Wt 176 lb 2.4 oz (79.9 kg)   SpO2 100%   BMI 39.49 kg/m   HEMODYNAMICS:    VENTILATOR SETTINGS:    INTAKE / OUTPUT: I/O last 3 completed shifts: In: 3890 [I.V.:2300; Other:40; IV Piggyback:1550] Out: 705 [Urine:605; Stool:100]  PHYSICAL EXAMINATION: General:  Frail elderly female in NAD lying in bed. No distress. Husband at bedside. Neuro:  Alert & oriented x4. Grossly nonfocal. Following commands. HEENT:  Tacky mucus membranes. Left neck & facial swelling to the level of the clavicle. No scleral icterus.  Cardiovascular:  Regular rate. No edema. Unable to appreciate JVD. Pulmonary:  Clear on auscultation. Normal work of breathing. Good voice quality although slightly raspy without stridor. Abdomen:  Soft. Ostomy in place. Nontender. Musculoskeletal:  No acute deformities. Left hip dressing clean, dry & in tact. Skin:  Warm & dry. Mild erythema over left neck/face.  LABS:  BMET  Recent Labs Lab 03/12/16 0419 03/15/16 0720 03/16/16 0210  NA 137 134* 139  K 4.4 3.9 3.7  CL 98* 99* 105  CO2 28 22 22   BUN 19 27* 18  CREATININE 0.77 0.93 0.80  GLUCOSE 98 144* 109*    Electrolytes  Recent Labs Lab 03/12/16 0419 03/15/16 0720 03/16/16 0210  CALCIUM 9.5 9.6 8.5*    CBC  Recent Labs Lab 03/12/16 0419 03/15/16 0720 03/16/16 0210  WBC 7.2 17.2* 18.8*  HGB 11.2* 12.9 10.4*  HCT 34.4* 37.8 32.7*  PLT 262 492* 432*  Coag's No results for input(s): APTT, INR in the last 168 hours.  Sepsis Markers  Recent Labs Lab 03/15/16 0739 03/15/16 1028 03/15/16 1222  LATICACIDVEN 1.29 1.96* 1.05    ABG No results for input(s): PHART, PCO2ART, PO2ART in the last 168 hours.  Liver Enzymes  Recent Labs Lab 03/15/16 0720  AST 20  ALT 21  ALKPHOS 78  BILITOT 1.3*  ALBUMIN 3.0*    Cardiac Enzymes No results for input(s): TROPONINI, PROBNP in the last 168 hours.  Glucose  Recent Labs Lab 03/15/16 1535  GLUCAP 151*     Imaging Dg Chest 2 View  Result Date: 03/15/2016 CLINICAL DATA:  New onset fever, left-sided facial and neck swelling and redness, possible sepsis. Remote history of smoking. EXAM: CHEST  2 VIEW COMPARISON:  Chest x-ray of March 05, 2016 FINDINGS: The lungs are mildly hyperinflated. There remain coarse lung markings in the right infrahilar region medially. There are no air bronchograms. There is no pleural effusion or pneumothorax. The heart and pulmonary vascularity are normal. The bony thorax exhibits no acute abnormality. IMPRESSION: Persistent increased density in the right infrahilar region. Probable COPD. No pneumonia nor CHF. Chest CT scanning is again recommended for further evaluation of the suspected nodule in the right infrahilar region. Electronically Signed   By: David  Martinique M.D.   On: 03/15/2016 08:14   Ct Soft Tissue Neck W Contrast  Result Date: 03/15/2016 CLINICAL DATA:  75 year old female with jaw swelling, pain, fever, redness of the left face and neck. Initial encounter. EXAM: CT NECK WITH CONTRAST TECHNIQUE: Multidetector CT imaging of the neck was performed using the standard protocol following the bolus administration of intravenous contrast. CONTRAST:  100 mL Isovue 370 COMPARISON:  Head CT without contrast 03/05/2016. FINDINGS: Pharynx and larynx: Negative larynx. Moderate severe left parapharyngeal space inflammation and soft tissue swelling. Generalized left lateral pharyngeal wall soft tissue swelling/edema. Associated retropharyngeal effusion. The inflammation extends from the superior left parapharyngeal space and nasopharynx through to the left piriform sinus. The right lateral pharynx and right parapharyngeal space are normal. Salivary glands: Negative sublingual space, but moderate to severe inflammation of the left parotid and submandibular gland with confluent inflammation tracking from the left parotid space through to the left submandibular space, in  association with the widespread left parapharyngeal space edema. Superimposed left lateral face and neck subcutaneous edema and skin thickening. Widespread edema/free fluid tracking in the left neck, including wrapping around the posterior left neck to the midline. There is edema within the left sternocleidomastoid muscle. No organized or drainable fluid collection. The inflamed left parotid and submandibular glands are hyper enhancing. There is no enlargement of the left submandibular duct. The left parotid duct is mostly obscured by dental streak artifact. There is a punctate sialolith along the anterior left submandibular gland on series 2, image 52. No other sialolithiasis identified. The right submandibular and right parotid glands are normal. Thyroid: Negative thyroid. Lymph nodes:  No right cervical lymphadenopathy. Left side cervical lymph nodes from the level 1 to level 3 stations are hyper enhancing but remain small, 5-6 mm short axis and smaller. No enlarged nodes. No cystic or necrotic nodes. Vascular: The right IJ is dominant and patent. Non dominant and primarily drains the left face. The left EJ is patent. Bilateral major vascular structures in the neck and at the skullbase are patent. Limited intracranial: Negative. Visualized orbits: Negative, postoperative changes to both globes. Mastoids and visualized paranasal sinuses: Visualized paranasal sinuses and mastoids are stable and  well pneumatized. Skeleton: The mandible is intact. The left mandible appears normal. No acute dental changes identified. Degenerative changes in the cervical spine. No acute osseous abnormality identified. Upper chest: Normal visualized superior mediastinum. Negative lung apices aside from mild right upper lobe bronchiectasis. No axillary lymphadenopathy. Other: None. IMPRESSION: 1. Severe trans-spatial inflammation affecting the left face and neck. The source appears to be acute inflammation of both the left parotid and  submandibular glands. Favor acute infectious sialoadenitis. 2. Inflammation and edema tracking throughout the superficial and deep left neck including the left parapharyngeal, retropharyngeal spaces and diffusely involving the left lateral pharyngeal mucosal space. 3. No airway compromise at this time. No drainable fluid collection or abscess. No associated vascular thrombosis. 4. Small reactive lymph nodes throughout the left level 1 to level 3 nodal stations. 5. No involvement of the upper chest. Electronically Signed   By: Genevie Ann M.D.   On: 03/15/2016 09:18     STUDIES:  CT Neck 10/10: severe trans-spatial inflammation affecting the left face / neck with source being an acute inflammation of both the left parotid and submandibular glands, inflammation & edema tracking throughout the superficial / deep left neck, no airway compromise at this time, no drainable fluid collection / abscess, no involvement of upper chest.   MICROBIOLOGY: MRSA PCR 10/10:  Negative Blood Ctx x2 10/10 >>  ANTIBIOTICS: Vancomycin 10/10 >>  Zosyn 10/10 >>   SIGNIFICANT EVENTS: 10/10 -  Admit with left neck pain / swelling, concern for parotid/submandibular gland acute inflammation, no abscess  10/10 - Transfer to [facility] at ENT request  LINES/TUBES: Foley 10/10 >> PIV x2  ASSESSMENT / PLAN:  Acute parotitis  At Risk Airway Compromise / Obstruction - patent airway noted on CT At Risk Aspiration - secondary to above   Plan:   Aspiration precautions  ENT following O2 as needed for sats > 92% Will ask SLP to see re: swallow eval Warm compresses every x 10-15 minutes every 4 hrs during day-time and PRN Empiric Vancomycin & Zosyn Day #2 NS @ 75 cc/hr SLP to eval for swallowing Checking X-ray left hip w/ pelvis   Leukocytosis - Secondary to parotitis. Anemia - No signs of active bleeding. Likely dilutional as patient is volume up. Plan  Trending cell counts w/ CBC SCDs  - Inter-disciplinary family meet  or Palliative Care meeting due by:  10/17  TODAY'S SUMMARY: 75 y.o. female with PMH of Chron's disease, recent L hip fracture s/p IM nail (9/30) admitted 10/10 with left neck pain / swelling.  CT neck concerning for acute inflammation of both the left parotid and submandibular glands.  She is protecting airway. No evidence of airway obstruction. Care focus remains supportive including: IV hydration, empiric antibiotics, pain management, and now we will add rehabilitative services: including SLP eval for swallowing & PT to assist w/ mobility. She is stable for SDU setting.   Sonia Baller Ashok Cordia, M.D. [facility] Pulmonary & Critical Care Pager:  [phone] After 3pm or if no response, call [phone] 03/16/2016, 7:50 AM

## 2016-03-16 NOTE — Progress Notes (Signed)
Patient irritable, demanding pain medicine and refusing turns. When RT asked RN a question patient stated to RN "you pay attention to me" and shooed RT away. Patient receiving her pain medication when the time comes and her needs are met by RN as able.

## 2016-03-16 NOTE — Progress Notes (Signed)
Patient resting quietly with eyes closed awakened patient states that pain 10/10.

## 2016-03-16 NOTE — Evaluation (Signed)
Clinical/Bedside Swallow Evaluation Patient Details  Name: Kim [name] MRN: [id number] Date of Birth: 01/21/1941  Today's Date: 03/16/2016 Time: SLP Start Time (ACUTE ONLY): 57 SLP Stop Time (ACUTE ONLY): 0947 SLP Time Calculation (min) (ACUTE ONLY): 27 min  Past Medical History:  Past Medical History:  Diagnosis Date  . Anemia    related to malabsorption--gets B12 shots and iron infusions, managed by Dr. Julien Nordmann  . BCC (basal cell carcinoma of skin)   . Crohn disease ([place])    Dr. Emelda Fear at [facility]  . Glaucoma    Dr. Janyth Contes  . Intestinal malabsorption   . OA (osteoarthritis)    hands,back  . Osteoporosis    managed by Dr. [place] Crane at [facility] [place] q2 yrs   Past Surgical History:  Past Surgical History:  Procedure Laterality Date  . Crohn  2009  . internal ostomy pouch  1994  . INTRAMEDULLARY (IM) NAIL INTERTROCHANTERIC Left 03/05/2016   Procedure: INTRAMEDULLARY (IM) NAIL INTERTROCHANTRIC;  Surgeon: Nicholes Stairs, MD;  Location: [facility];  Service: Orthopedics;  Laterality: Left;  . TOTAL COLECTOMY  1984   due to Crohn's   HPI:  75 y/o F with PMH of anemia, glaucoma, osteoarthritis, Chron's disease s/p total colectomy (1984), internal ostomy pouch with recent sugical resection of stenosed small bowel / slipped Koch pouch (02/02/16), C-Diff colitis, &osteoporosis s/p L femur fracture with IM nail 03/05/16. Presents at ED with left neck pain and facial swelling. She has muffled speech, difficulty with phonation, and limited movement of her neck. CXR shows probable COPD, but no pneumonia nor CHF. CT of soft tissue of the neck showed inflammation and edema tracking throughout the superficial and   Assessment / Plan / Recommendation Clinical Impression  Pt repostitioned for PO trials. Her voice was hoarse and with low intensity throughout session. She displayed a weak cough and mild impairment effecting left side oral movements. SLP had limited view inside pt's oral cavity given the  edema of her face and neck. During initial ice chip trials pt displayed immediate coughing; however, when using a left head turn pt did not cough on ice chips. Despite using the left head turn, pt had immediate coughing following thin liquid trials. SLP suspects pt has impaired airway closure given the edema of her face and neck. With additional time for swelling to reduce SLP will f/u to test for further PO readiness. Recommend consuming ice chips only with a left head turn.    Aspiration Risk  Mild aspiration risk    Diet Recommendation Ice chips PRN after oral care   Medication Administration: Via alternative means Postural Changes: Seated upright at 90 degrees    Other  Recommendations Oral Care Recommendations: Oral care prior to ice chip/H20   Follow up Recommendations Skilled Nursing facility      Frequency and Duration min 2x/week  2 weeks       Prognosis Prognosis for Safe Diet Advancement: Good      Swallow Study   General HPI: 75 y/o F with PMH of anemia, glaucoma, osteoarthritis, Chron's disease s/p total colectomy (1984), internal ostomy pouch with recent sugical resection of stenosed small bowel / slipped Koch pouch (02/02/16), C-Diff colitis, &osteoporosis s/p L femur fracture with IM nail 03/05/16. Presents at ED with left neck pain and facial swelling. She has muffled speech, difficulty with phonation, and limited movement of her neck. CXR shows probable COPD, but no pneumonia nor CHF. CT of soft tissue of the neck showed inflammation and edema  tracking throughout the superficial and Type of Study: Bedside Swallow Evaluation Previous Swallow Assessment: none in chart Diet Prior to this Study: NPO Temperature Spikes Noted: No Respiratory Status: Nasal cannula History of Recent Intubation: No Behavior/Cognition: Alert;Cooperative Oral Cavity Assessment: Other (comment) (limited view) Oral Care Completed by SLP: No Oral Cavity - Dentition: Adequate natural  dentition Self-Feeding Abilities: Total assist Patient Positioning: Upright in bed Baseline Vocal Quality: Low vocal intensity;Hoarse Volitional Cough: Weak Volitional Swallow: Able to elicit    Oral/Motor/Sensory Function Overall Oral Motor/Sensory Function: Mild impairment Facial ROM: Reduced left Facial Symmetry: Abnormal symmetry left Facial Strength: Reduced left Lingual ROM: Reduced left   Ice Chips Ice chips: Impaired Presentation: Spoon Pharyngeal Phase Impairments: Cough - Immediate   Thin Liquid Thin Liquid: Impaired Presentation: Spoon;Straw Pharyngeal  Phase Impairments: Cough - Immediate    Nectar Thick Nectar Thick Liquid: Not tested   Honey Thick Honey Thick Liquid: Not tested   Puree Puree: Not tested   Solid   GO   Solid: Not tested       Ezekiel Slocumb, Student SLP  Shela Leff 03/16/2016,11:04 AM

## 2016-03-17 ENCOUNTER — Inpatient Hospital Stay (HOSPITAL_COMMUNITY): Payer: Medicare Other

## 2016-03-17 LAB — BASIC METABOLIC PANEL
Anion gap: 8 (ref 5–15)
BUN: 13 mg/dL (ref 6–20)
CHLORIDE: 106 mmol/L (ref 101–111)
CO2: 26 mmol/L (ref 22–32)
CREATININE: 0.76 mg/dL (ref 0.44–1.00)
Calcium: 8.9 mg/dL (ref 8.9–10.3)
GFR calc Af Amer: >60 mL/min (ref >60)
GFR calc non Af Amer: >60 mL/min (ref >60)
GLUCOSE: 116 mg/dL — AB (ref 65–99)
POTASSIUM: 3.2 mmol/L — AB (ref 3.5–5.1)
SODIUM: 140 mmol/L (ref 135–145)

## 2016-03-17 LAB — CBC
HEMATOCRIT: 29 % — AB (ref 36.0–46.0)
Hemoglobin: 9.1 g/dL — ABNORMAL LOW (ref 12.0–15.0)
MCH: 29.6 pg (ref 26.0–34.0)
MCHC: 31.4 g/dL (ref 30.0–36.0)
MCV: 94.5 fL (ref 78.0–100.0)
PLATELETS: 467 10*3/uL — AB (ref 150–400)
RBC: 3.07 MIL/uL — ABNORMAL LOW (ref 3.87–5.11)
RDW: 16.3 % — AB (ref 11.5–15.5)
WBC: 16.2 10*3/uL — AB (ref 4.0–10.5)

## 2016-03-17 MED ORDER — MORPHINE SULFATE (PF) 2 MG/ML IV SOLN
1.0000 mg | INTRAVENOUS | Status: DC | PRN
  Administered 2016-03-17 – 2016-03-18 (×2): 2 mg via INTRAVENOUS
  Filled 2016-03-17 (×2): qty 1

## 2016-03-17 MED ORDER — VANCOMYCIN HCL 10 G IV SOLR
1250.0000 mg | INTRAVENOUS | Status: DC
  Administered 2016-03-18 – 2016-03-20 (×3): 1250 mg via INTRAVENOUS
  Filled 2016-03-17 (×4): qty 1250

## 2016-03-17 MED ORDER — VANCOMYCIN HCL IN DEXTROSE 1-5 GM/200ML-% IV SOLN
1000.0000 mg | Freq: Once | INTRAVENOUS | Status: AC
  Administered 2016-03-17: 1000 mg via INTRAVENOUS
  Filled 2016-03-17: qty 200

## 2016-03-17 NOTE — Progress Notes (Signed)
PT Cancellation Note  Patient Details Name: ANSHIKA PETHTEL MRN: [id number] DOB: 10/30/1940   Cancelled Treatment:    Reason Eval/Treat Not Completed: Patient declined, no reason specified (Pt wants to have [place] make an "appointment" for tomorrow). Will follow up in AM. During prior hospitalization treatment sessions required extended time for minimal mobility.   Delrick Dehart 03/17/2016, 3:40 PM [facility] PT [phone]

## 2016-03-17 NOTE — Progress Notes (Signed)
  Subjective: She is more awake and alert. The swelling is still significant in the left parotid.  Objective: Vital signs in last 24 hours: Temp:  [97.5 F (36.4 C)-98.2 F (36.8 C)] 98.2 F (36.8 C) (10/12 1217) Pulse Rate:  [73-88] 79 (10/12 1200) Resp:  [9-20] 17 (10/12 1200) BP: (108-142)/(48-86) 113/54 (10/12 1200) SpO2:  [85 %-100 %] 100 % (10/12 1200) Last BM Date: 03/16/16  Intake/Output from previous day: 10/11 0701 - 10/12 0700 In: 2200 [I.V.:1800; IV Piggyback:250] Out: 1100 [Urine:500; Stool:600] Intake/Output this shift: Total I/O In: 575 [I.V.:375; IV Piggyback:200] Out: 150 [Urine:150]  awake and alert. she is demanding. The left face has erythema and swelling. the erythema extneds down to the base of the neck. the parotid gland is very fiorm and tender. oc/op- the swelling of the soft palate is better and the trismus is better. no stridor voce is normal   Lab Results:   Recent Labs  03/15/16 0720 03/16/16 0210  WBC 17.2* 18.8*  HGB 12.9 10.4*  HCT 37.8 32.7*  PLT 492* 432*   BMET  Recent Labs  03/15/16 0720 03/16/16 0210  NA 134* 139  K 3.9 3.7  CL 99* 105  CO2 22 22  GLUCOSE 144* 109*  BUN 27* 18  CREATININE 0.93 0.80  CALCIUM 9.6 8.5*   PT/INR No results for input(s): LABPROT, INR in the last 72 hours. ABG No results for input(s): PHART, HCO3 in the last 72 hours.  Invalid input(s): PCO2, PO2  Studies/Results: No results found.  Anti-infectives: Anti-infectives    Start     Dose/Rate Route Frequency Ordered Stop   03/18/16 0600  vancomycin (VANCOCIN) 1,250 mg in sodium chloride 0.9 % 250 mL IVPB     1,250 mg 166.7 mL/hr over 90 Minutes Intravenous Every 24 hours 03/17/16 1119     03/17/16 1130  vancomycin (VANCOCIN) IVPB 1000 mg/200 mL premix     1,000 mg 200 mL/hr over 60 Minutes Intravenous  Once 03/17/16 1119 03/17/16 1235   03/17/16 0600  vancomycin (VANCOCIN) 500 mg in sodium chloride 0.9 % 100 mL IVPB  Status:   Discontinued     500 mg 100 mL/hr over 60 Minutes Intravenous Every 24 hours 03/15/16 1157 03/17/16 1119   03/15/16 1400  piperacillin-tazobactam (ZOSYN) IVPB 3.375 g     3.375 g 12.5 mL/hr over 240 Minutes Intravenous Every 8 hours 03/15/16 1157     03/15/16 0715  piperacillin-tazobactam (ZOSYN) IVPB 3.375 g     3.375 g 100 mL/hr over 30 Minutes Intravenous  Once 03/15/16 0714 03/15/16 0849   03/15/16 0715  vancomycin (VANCOCIN) IVPB 1000 mg/200 mL premix     1,000 mg 200 mL/hr over 60 Minutes Intravenous  Once 03/15/16 0714 03/15/16 0852      Assessment/Plan: s/p * No surgery found * Parotitis- I think she needs to continue the IV abx and I think MRSA is still a significant risk. The parotid is still significantly swollen and firm. A heating pad to the area may help. I would repeat the CT scan in few days if not progressing. It is unusual for the parotid to abscess to need I/D but possible.   LOS: 2 days    [name] 03/17/2016

## 2016-03-17 NOTE — Progress Notes (Signed)
Spoke with husband. Updated on patient condition and he states that he will be her in 20 minutes.

## 2016-03-17 NOTE — Progress Notes (Signed)
Speech Language Pathology Treatment: Dysphagia  Patient Details Name: Kim [name] MRN: [id number] DOB: [birth year] Today's Date: 03/17/2016 Time: 5784-6962 SLP Time Calculation (min) (ACUTE ONLY): 41 min  Assessment / Plan / Recommendation Clinical Impression  Pt's vocal quality and oral movements were improved from initial evaluation. She was verbally irritable throughout session, but cooperated for PO trials. Oral care was provided and pt was repositioned for trials of ice chips, thin liquid, and pureed solids. There was one instance of an immediate throat clear following thin liquid via straw. Given additional large trials of thin via straw this did not occur again.  Recommend Dys 2 diet and thin liquid given pt's adamant requests for specific foods while considering reported pain level. Will f/u for diet tolerance and advancement as pain level allows. Suspect pt will be able to advance to unrestricted textures as her pain subsides.   HPI HPI: 75 y/o F with PMH of anemia, glaucoma, osteoarthritis, Chron's disease s/p total colectomy (1984), internal ostomy pouch with recent sugical resection of stenosed small bowel / slipped Koch pouch (02/02/16), C-Diff colitis, &osteoporosis s/p L femur fracture with IM nail 03/05/16. Presents at ED with left neck pain and facial swelling. She has muffled speech, difficulty with phonation, and limited movement of her neck. CXR shows probable COPD, but no pneumonia nor CHF. CT of soft tissue of the neck showed inflammation and edema tracking throughout the superficial and      SLP Plan  Continue with current plan of care     Recommendations  Diet recommendations: Dysphagia 2 (fine chop);Thin liquid Liquids provided via: Cup;Straw Medication Administration: Crushed with puree Supervision: Full supervision/cueing for compensatory strategies;Patient able to self feed Compensations: Minimize environmental distractions;Small sips/bites;Slow rate Postural  Changes and/or Swallow Maneuvers: Seated upright 90 degrees;Upright 30-60 min after meal                Oral Care Recommendations: Oral care BID Follow up Recommendations: Skilled Nursing facility Plan: Continue with current plan of care       [place], Student SLP  Shela Leff 03/17/2016, 10:38 AM

## 2016-03-17 NOTE — Progress Notes (Signed)
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  Hospital course also notable for incidental finding of concern for lung nodule (on CXR).     She returns 10/10 to [facility] ER with reports of left sided neck pain and swelling that began 10/9.  Her husband reports she noted pain at time of discharge in left neck.  This became acutely worse on 9/10 and EMS was activated for evaluation.  The patient was found to be protecting her airway in the ER but she had muffled speech, difficulty with phonation, and limited movement of her neck.  Initial labs - NA 134, K 3.9, sr cr 0.93, glucose 144, WBC 17.2 and platelets 492.  CT evaluation of the neck demonstrated severe trans-spatial inflammation affecting the left face / neck with source being an acute inflammation of both the left parotid and submandibular glands, inflammation & edema tracking throughout the superficial / deep left neck, no airway compromise at this time, no drainable fluid collection / abscess, no involvement of upper chest.   PCCM called for evaluation.    Subjective: 10/12   A/O 4, somewhat confused anxious. Afraid she is going to die.Constantly has to be reappointed to recent conversations concerning her treatment plan.    Assessment & Plan:   Active Problems:   Sepsis ([place])   Sialadenitis Acute parotitis  At Risk Airway Compromise / Obstruction - patent airway noted on CT At Risk Aspiration - secondary to above  -Spoke with Dr. Janace Hoard ENT recommends maintaining patient on current antibiotics. -Recommends repeat CT if patient  doesn't continue to improve in next few days. -Aspiration precautions  -Decrease pain medication as patient is extremely confused and a greater risk for aspiration -Titrate O2 to maintain SPO2 > 92% -Warm compresses every x 10-15 minutes every 4 hrs during day-time and PRN -Empiric Vancomycin & Zosyn Day #2 -Normal saline 14m/hr      DVT prophylaxis: Subcutaneous heparin Code Status: Full Family Communication: None Disposition Plan: SNF   Consultants:  Dr. JMelissa MontaneENT Dr. NAshok CordiaPSquaw Peak Surgical Facility IncCM    Procedures/Significant Events:  10/10 -  Admit with left neck pain / swelling, concern for parotid/submandibular gland acute inflammation, no abscess  [facility]10 [facility]  Cultures MRSA PCR 10/10:  Negative Blood Ctx x2 10/10 >>  Antimicrobials: Vancomycin 10/10 >>  Zosyn 10/10 >>    Devices    LINES / TUBES:  Foley 10/10 >> PIV x2    Continuous Infusions: . sodium chloride 75 mL/hr at 03/17/16 1103     Objective: Vitals:   03/17/16 1000 03/17/16 1100 03/17/16 1200 03/17/16 1217  BP: 120/86 129/71 (!) 113/54   Pulse: 85 81 79   Resp: 20 10 17    Temp:    98.2 F (36.8 C)  TempSrc:    Oral  SpO2: (!) 85% 99% 100%   Weight:        Intake/Output Summary (Last 24 hours) at 03/17/16 1410 Last data filed at 03/17/16 1200  Gross per 24 hour  Intake             2050 ml  Output             1250 ml  Net              800 ml   Filed Weights   03/16/16 0345  Weight: 79.9 kg (176 lb 2.4 oz)    Examination:  General:  A/O 4 however constantly has to be reappointed to recent conversations concerning her treatment plan, cachectic, No acute respiratory distress Eyes: negative scleral hemorrhage, negative anisocoria, negative icterus Neck: Left side of her face neck shoulder erythematous tender to the touch, warm to the touch Lungs: Clear to auscultation bilaterally without wheezes or crackles Cardiovascular: Regular rate and rhythm without murmur  gallop or rub normal S1 and S2 Abdomen: negative abdominal pain, nondistended, positive soft, bowel sounds, no rebound, no ascites, no appreciable mass Extremities: No significant cyanosis, clubbing, or edema bilateral lower extremities Skin: Negative rashes, lesions, ulcers Psychiatric:  Negative depression, positive anxiety, negative fatigue, negative mania  Central nervous system:  Cranial nerves II through XII intact, tongue/uvula midline, all extremities muscle strength 5/5, sensation intact throughout, negative dysarthria, negative expressive aphasia, negative receptive aphasia.  .     Data Reviewed: Care during the described time interval was provided by me .  I have reviewed this patient's available data, including medical history, events of note, physical examination, and all test results as part of my evaluation. I have personally reviewed and interpreted all radiology studies.  CBC:  Recent Labs Lab 03/12/16 0419 03/15/16 0720 03/16/16 0210  WBC 7.2 17.2* 18.8*  NEUTROABS 6.2 16.0*  --   HGB 11.2* 12.9 10.4*  HCT 34.4* 37.8 32.7*  MCV 93.0 89.8 93.7  PLT 262 492* 174*   Basic Metabolic Panel:  Recent Labs Lab 03/12/16 0419 03/15/16 0720 03/16/16 0210  NA 137 134* 139  K 4.4 3.9 3.7  CL 98* 99* 105  CO2 28 22 22   GLUCOSE 98 144* 109*  BUN 19 27* 18  CREATININE 0.77 0.93 0.80  CALCIUM 9.5 9.6 8.5*   GFR: Estimated Creatinine Clearance: 52.3 mL/min (by C-G formula based on SCr of 0.8 mg/dL). Liver Function Tests:  Recent Labs Lab 03/15/16 0720  AST 20  ALT 21  ALKPHOS 78  BILITOT 1.3*  PROT 7.0  ALBUMIN 3.0*   No results for input(s): LIPASE, AMYLASE in the last 168 hours. No results for input(s): AMMONIA in the last 168 hours. Coagulation Profile: No results for input(s): INR, PROTIME in the last 168 hours. Cardiac Enzymes: No results for input(s): CKTOTAL, CKMB, CKMBINDEX, TROPONINI in the last 168 hours. BNP (last 3 results) No results for  input(s): PROBNP in the last 8760 hours. HbA1C: No results for input(s): HGBA1C in the last 72 hours. CBG:  Recent Labs Lab 03/15/16 1535  GLUCAP 151*   Lipid Profile: No results for input(s): CHOL, HDL, LDLCALC, TRIG, CHOLHDL, LDLDIRECT in the last 72 hours. Thyroid Function Tests: No results for input(s): TSH, T4TOTAL, FREET4, T3FREE, THYROIDAB in the last 72 hours. Anemia Panel: No results for input(s): VITAMINB12, FOLATE, FERRITIN, TIBC, IRON, RETICCTPCT in the last 72 hours. Urine analysis:    Component Value Date/Time   COLORURINE YELLOW 03/11/2016 0736   APPEARANCEUR CLEAR 03/11/2016 0736   LABSPEC 1.025 03/11/2016 0736   PHURINE 5.0 03/11/2016 0736   GLUCOSEU NEGATIVE 03/11/2016 0736   HGBUR NEGATIVE 03/11/2016 0736   BILIRUBINUR SMALL (A) 03/11/2016 0736   BILIRUBINUR neg 08/22/2011 1010   [place] 03/11/2016 0736   PROTEINUR NEGATIVE 03/11/2016 0736  UROBILINOGEN negative 08/22/2011 1010   UROBILINOGEN 0.2 11/19/2007 0157   NITRITE NEGATIVE 03/11/2016 0736   LEUKOCYTESUR SMALL (A) 03/11/2016 0736   Sepsis Labs: @LABRCNTIP (procalcitonin:4,lacticidven:4)  ) Recent Results (from the past 240 hour(s))  Culture, blood (Routine x 2)     Status: None (Preliminary result)   Collection Time: 03/15/16  7:20 AM  Result Value Ref Range Status   Specimen Description BLOOD RIGHT WRIST  Final   Special Requests BOTTLES DRAWN AEROBIC AND ANAEROBIC 5CC  Final   Culture   Final    NO GROWTH 2 DAYS Performed at [facility]    Report Status PENDING  Incomplete  Culture, blood (Routine x 2)     Status: None (Preliminary result)   Collection Time: 03/15/16  7:20 AM  Result Value Ref Range Status   Specimen Description BLOOD LEFT FOREARM  Final   Special Requests IN PEDIATRIC BOTTLE [place]  Final   Culture   Final    NO GROWTH 2 DAYS Performed at [facility]    Report Status PENDING  Incomplete  MRSA PCR Screening     Status: None   Collection  Time: 03/15/16  3:20 PM  Result Value Ref Range Status   MRSA by PCR NEGATIVE NEGATIVE Final    Comment:        The GeneXpert MRSA Assay (FDA approved for NASAL specimens only), is one component of a comprehensive MRSA colonization surveillance program. It is not intended to diagnose MRSA infection nor to guide or monitor treatment for MRSA infections.          Radiology Studies: No results found.      Scheduled Meds: . acetaminophen  650 mg Rectal Once  . acetaminophen  650 mg Oral Once  . dorzolamide-timolol  1 drop Both Eyes Q12H  . enoxaparin (LOVENOX) injection  40 mg Subcutaneous Q1200  . latanoprost  1 drop Both Eyes QHS  . ondansetron (ZOFRAN) IV  4 mg Intravenous Once  . piperacillin-tazobactam (ZOSYN)  IV  3.375 g Intravenous Q8H  . [START ON 03/18/2016] vancomycin  1,250 mg Intravenous Q24H   Continuous Infusions: . sodium chloride 75 mL/hr at 03/17/16 1103     LOS: 2 days    Time spent: 40 minutes    WOODS, Geraldo Docker, MD [facility] Pager [phone]   If 7PM-7AM, please contact night-coverage [email or web address] Password [email or web address] 03/17/2016, 2:10 PM

## 2016-03-17 NOTE — Progress Notes (Signed)
Patient complaining to her husband she ask him to leave and not come back. She refused her lunch tray states I had rather die than eat.

## 2016-03-17 NOTE — Progress Notes (Signed)
Called patients husband per patient request I called the home and cell number with no answer. Patient is complaining that she feels the care is sub normal and has requested that students not be assigned to her. Patient continues to call out ever 2.5 hours asking for morphine. Patient does not understand why she has to ask for pain medication. Continued educations on pain medication and how they are scheduled.

## 2016-03-17 NOTE — Care Management Note (Signed)
Case Management Note  Patient Details  Name: Kim [name] MRN: [id number] Date of Birth: 05-14-1941  Subjective/Objective:   Pt admitted with sialadentis  Action/Plan:  Discharged to [facility] 03/13/16 from recent hospitalization due to ORIF repair.  CSW made aware that pt is from [facility].  CM will continue to follow for discharge needs   Expected Discharge Date:                  Expected Discharge Plan:  [place]  In-House Referral:  Clinical Social Work  Discharge planning Services     Post Acute Care Choice:    Choice offered to:  Patient  DME Arranged:    DME Agency:     HH Arranged:    [place] Agency:     Status of Service:  In process, will continue to follow  If discussed at Long Length of Stay Meetings, dates discussed:    Additional Comments: 03/17/2016 Elenor Quinones, RN, BSN [phone] Transfer to floor ordered  Maryclare Labrador, RN 03/17/2016, 11:46 AM

## 2016-03-17 NOTE — Progress Notes (Signed)
Pharmacy Antibiotic Note  KY MOSKOWITZ is a 75 y.o. female admitted on 03/15/2016 with parotitis.  Pharmacy has been consulted for Vancomycin and Zosyn dosing. Patient given vancomycin 1g load and initially started on 559m q24h. Dose increase is now warranted based on weight of 79.9kg and CrCl ~52 ml/min. No abscess noted on neck CT, so will target vancomycin trough of 10-15. Of note, patient has had minimal urine output (~0.26 ml/kg/hr), though SCr remains stable.  Plan: -Vancomycin 1gm x1 in effort to attain therapeutic levels as patient has already received morning dose of 5016m -Start vancomycin 1.25 g q24h on 10/13 -Continue Zosyn 3.375 g q8h -Follow-up length of therapy, culture data, S/Sx infection -Check vancomycin trough as indicated  Weight: 176 lb 2.4 oz (79.9 kg)  Temp (24hrs), Avg:97.8 F (36.6 C), Min:97.5 F (36.4 C), Max:98.1 F (36.7 C)   Recent Labs Lab 03/12/16 0419 03/15/16 0720 03/15/16 0739 03/15/16 1028 03/15/16 1222 03/16/16 0210  WBC 7.2 17.2*  --   --   --  18.8*  CREATININE 0.77 0.93  --   --   --  0.80  LATICACIDVEN  --   --  1.29 1.96* 1.05  --     Estimated Creatinine Clearance: 52.3 mL/min (by C-G formula based on SCr of 0.8 mg/dL).    Allergies  Allergen Reactions  . Antihistamines, Chlorpheniramine-Type     Dry  Mouth, dry eyes-so dry she cannot even talk  . Metronidazole     Peripheral neuropathy  . Other Diarrhea and Nausea And Vomiting    Patient has an Ileostomy  . Sulfa Antibiotics Other (See Comments)    UNKNOWN  . Tape Other (See Comments)    SKIN WILL TEAR WITH ANYTHING OTHER THAN PAPER TAPE OR COBAN WRAP    Antimicrobials this admission: 10/10 Vancomycin >>  10/10 Zosyn >>   Dose adjustments this admission: 10/12: Increase vancomycin per nomogram given current weight and CrCl. Will reload as prior dosing unlikely to attain therapeutic levels.  Microbiology results: 10/10 BCx: NGTD  10/10 MRSA PCR: negative  Thank  you for allowing pharmacy to be a part of this patient's care.  MiArrie SenatePharmD PGY-1 Pharmacy Resident Pager: [phone]/05/2016

## 2016-03-18 MED ORDER — MORPHINE SULFATE (PF) 2 MG/ML IV SOLN
1.0000 mg | INTRAVENOUS | Status: DC | PRN
  Administered 2016-03-18 – 2016-03-21 (×24): 2 mg via INTRAVENOUS
  Filled 2016-03-18 (×24): qty 1

## 2016-03-18 NOTE — Consult Note (Addendum)
[place] Nurse ostomy consult note Stoma type/location:  Consult requested to assist with ostomy supplies.  Pt previously had a Koch pouch for 23 years which recently failed and she had surgery for an ileostomy stoma creation in August at [facility].  She is very well-informed with ostomy application and emptying and is usually independent when at home.  She states she will need assistance with pouch application and emptying while in the hospital.  She uses 2 piece moldable pouches, which are not available in the [place]. Stomal assessment/size:  Stoma red and viable, above skin level, 3/4 inches. Peristomal assessment: Intact skin surrounding Treatment options for stomal/peristomal skin: Pt uses ostomy powder, which was ordered for use during next pouch change Output: Small amt tan liquid stool Ostomy pouching: 2pc.  Education provided: Pt denies questions or need for teaching.She states she can explain the steps for pouch application to the bedside nurses next change. Applied 2 piece moldable Convatec pouch today and provided 2 sets of free samples at the bedside, since this pouching system is not available in the [place].   Please re-consult if further assistance is needed.  Thank-you,  Julien Girt MSN, [place], [place], [place], [place]

## 2016-03-18 NOTE — Care Management Important Message (Signed)
Important Message  Patient Details  Name: Kim [name] MRN: [id number] Date of Birth: May 23, 1941   Medicare Important Message Given:  Yes    Joesphine Schemm [place] 03/18/2016, 10:24 AM

## 2016-03-18 NOTE — Clinical Social Work Note (Signed)
Clinical Social Work Assessment  Patient Details  Name: Kim [name] MRN: [id number] Date of Birth: Mar 22, 1941  Date of referral:  03/18/16               Reason for consult:  Facility Placement                Permission sought to share information with:  Facility Sport and exercise psychologist, Family Supports Permission granted to share information::  Yes, Verbal Permission Granted  Name::     Kim [name]  Agency::     Relationship::  Husband  Contact Information:     Housing/Transportation Living arrangements for the past 2 months:  [place] of Information:  Patient Patient Interpreter Needed:  None Criminal Activity/Legal Involvement Pertinent to Current Situation/Hospitalization:  No - Comment as needed Significant Relationships:  Spouse Lives with:  Spouse Do you feel safe going back to the place where you live?  Yes Need for family participation in patient care:  Yes (Comment)  Care giving concerns:  CSW received consult regarding discharge planning. Patient reported that she came to the hospital from [facility] and will return at discharge. Patient appeared guarded and stated, "I'm very ill right now." CSW explained that the purpose of the discussion was for future discharge planning.   Social Worker assessment / plan:  Patient will return to [employer] at [employer].   Employment status:  Retired Nurse, adult PT Recommendations:  Not assessed at this time [place] / Referral to community resources:  [place]  Patient/Family's Response to care:  Patient was guarded and was insistent that she was not going anywhere soon.  Patient/Family's Understanding of and Emotional Response to Diagnosis, Current Treatment, and Prognosis:  No questions or concerns presented at this time.   Emotional Assessment Appearance:  Appears stated age Attitude/Demeanor/Rapport:  Guarded Affect (typically observed):   Defensive Orientation:  Oriented to Self, Oriented to Situation, Oriented to Place, Oriented to  Time Alcohol / Substance use:  Not Applicable Psych involvement (Current and /or in the community):  No (Comment)  Discharge Needs  Concerns to be addressed:  Care Coordination Readmission within the last 30 days:  Yes Current discharge risk:  Dependent with Mobility Barriers to Discharge:  Continued Medical Work up   [employer], [place] 03/18/2016, 5:10 PM

## 2016-03-18 NOTE — NC FL2 (Signed)
[place] LEVEL OF CARE SCREENING TOOL     IDENTIFICATION  Patient Name: Kim [name] Birthdate: September 07, 1940 Sex: female Admission Date (Current Location): 03/15/2016  [facility] [place] Number:  Herbalist and Address:  [facility] [place], [place] [address], [place], [place] [address]      Provider Number: [id number]  Attending Physician Name and Address:  Patrecia Pour, MD  Relative Name and Phone Number:       Current Level of Care: Hospital Recommended Level of Care: [place] Prior Approval Number:    Date Approved/Denied:   PASRR Number: [id number]  Discharge Plan: SNF    Current Diagnoses: Patient Active Problem List   Diagnosis Date Noted  . Sepsis ([place]) 03/15/2016  . Sialadenitis 03/15/2016  . Parotiditis   . Protein-calorie malnutrition, severe 03/10/2016  . Hip fracture ([place]) 03/05/2016  . Protein calorie malnutrition ([place]) 03/05/2016  . Acute hyponatremia 03/05/2016  . Lung nodule 03/05/2016  . Osteoporosis 03/18/2013  . Glaucoma 03/18/2013  . 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 OA (osteoarthritis)   . Anemia of chronic disease 06/07/2012  . Iron deficiency anemia 05/10/2011    Orientation RESPIRATION BLADDER Height & Weight     Place, Situation, Self  O2 (Nasal cannula 2L) Continent Weight: 37.8 kg (83 lb 4.8 oz) Height:  4' 9.6" (146.3 cm)  BEHAVIORAL SYMPTOMS/MOOD NEUROLOGICAL BOWEL NUTRITION STATUS      Continent, Ileostomy Diet (Please see DC Summary)  AMBULATORY STATUS COMMUNICATION OF NEEDS Skin   Extensive Assist Verbally Surgical wounds (Closed incision on thigh)                       Personal Care Assistance Level of Assistance  Bathing, Feeding, Dressing Bathing Assistance: Maximum assistance Feeding assistance: Independent Dressing Assistance: Maximum assistance     Functional Limitations Info             SPECIAL CARE FACTORS  FREQUENCY  PT (By licensed PT)     PT Frequency: not yet assessed              Contractures      Additional Factors Info  Code Status, Allergies Code Status Info: Full Allergies Info: Antihistamines, Chlorpheniramine-type, Metronidazole, Other, Sulfa Antibiotics, Tape           Current Medications (03/18/2016):  This is the current hospital active medication list Current Facility-Administered Medications  Medication Dose Route Frequency Provider Last Rate Last Dose  . 0.9 %  sodium chloride infusion   Intravenous Continuous Allie Bossier, MD 100 mL/hr at 03/18/16 [phone]    . acetaminophen (TYLENOL) suppository 650 mg  650 mg Rectal Once Doristine Devoid, PA-C      . dorzolamide-timolol (COSOPT) 22.3-6.8 MG/ML ophthalmic solution 1 drop  1 drop Both Eyes Q12H Chesley Mires, MD   1 drop at 03/18/16 0840  . enoxaparin (LOVENOX) injection 40 mg  40 mg Subcutaneous Q1200 Javier Glazier, MD   40 mg at 03/17/16 1117  . iopamidol (ISOVUE-300) 61 % injection 75 mL  75 mL Intravenous Once PRN Virgel Manifold, MD      . latanoprost (XALATAN) 0.005 % ophthalmic solution 1 drop  1 drop Both Eyes QHS Chesley Mires, MD   1 drop at 03/17/16 2140  . morphine 2 MG/ML injection 1-2 mg  1-2 mg Intravenous Q3H PRN Gardiner Barefoot, NP   2 mg  at 03/18/16 0707  . piperacillin-tazobactam (ZOSYN) IVPB 3.375 g  3.375 g Intravenous Q8H Baltazar Najjar [place], RPH   3.375 g at 03/18/16 0540  . vancomycin (VANCOCIN) 1,250 mg in sodium chloride 0.9 % 250 mL IVPB  1,250 mg Intravenous Q24H Einar Grad, RPH   1,250 mg at 03/18/16 6153   Facility-Administered Medications Ordered in Other Encounters  Medication Dose Route Frequency Provider Last Rate Last Dose  . acetaminophen (TYLENOL) tablet 650 mg  650 mg Oral Once [place]ton Adam, PA-C         Discharge Medications: Please see discharge summary for a list of discharge medications.  Relevant Imaging Results:  Relevant Lab Results:   Additional  Information SSN: [place], [place]

## 2016-03-18 NOTE — Progress Notes (Addendum)
Initial Nutrition Assessment  DOCUMENTATION CODES:   Underweight, Severe malnutrition in context of chronic illness  INTERVENTION:   -Snacks TID  NUTRITION DIAGNOSIS:   Malnutrition related to chronic illness as evidenced by severe depletion of muscle mass, severe depletion of body fat.  GOAL:   Patient will meet greater than or equal to 90% of their needs  MONITOR:   PO intake, Labs, Weight trends, Skin, I & O's  REASON FOR ASSESSMENT:   Consult Assessment of nutrition requirement/status  ASSESSMENT:   75 y/o F with PMH of Chron's disease, recent L hip fracture s/p IM nail (9/30) admitted 10/10 with left neck pain / swelling.  CT neck concerning for acute inflammation of both the left parotid and submandibular glands.    Pt admitted with acute parotitis.   Pt familiar to this RD due to previous admission. Pt from [facility]F, where she was discharged on 03/11/16 s/p lt IM nail procedure.   Attempted to examine pt x3, however, pt receiving nursing care at times of visit.   Per ONT note, parotid is still swollen; plan for CT of neck over weekend to rule out abscess.   Meal completion poor; PO: 20-25%. Per RN notes, pt has refused meals at times.  Chart reviewed; pt with hx of crohn's disease with ileostomy. Wt hx reviewed; pt has weighed between 75-80# over the past 3 years.  Pt does not like oral nutrition supplements; will honor pt's request not to receive them. RD will order high protein snacks between meals to promote optimal nutritional intake.   Pt with hx of severe malnutrition in the context of chronic illness, which is ongoing. Exam completed on 03/10/16 which revealed severe fat and muscle depletion and mild edema. RD suspects no changes to exam at this time.   Per SLP notes, will continue dysphagia 2 diet with thin liquids  With potential to upgrade next week.  Labs reviewed: CBGS: 151.   Diet Order:  DIET DYS 2 Room service appropriate? Yes; Fluid  consistency: Thin  Skin:   (closed lt thigh incision)  Last BM:  03/17/16  Height:   Ht Readings from Last 1 Encounters:  03/17/16 4' 9.6" (1.463 m)    Weight:   Wt Readings from Last 1 Encounters:  03/17/16 83 lb 4.8 oz (37.8 kg)    Ideal Body Weight:  43.6 kg  BMI:  Body mass index is 17.65 kg/m.  Estimated Nutritional Needs:   Kcal:  1100-1300  Protein:  55-70 grams  Fluid:  1.1-1.3 L  EDUCATION NEEDS:   No education needs identified at this time  Arael Piccione A. Jimmye Norman, RD, LDN, CDE Pager: [phone] After hours Pager: [phone]

## 2016-03-18 NOTE — Progress Notes (Signed)
PT Cancellation Note  Patient Details Name: Kim [name] MRN: [id number] DOB: 07/20/1940   Cancelled Treatment:    Reason Eval/Treat Not Completed: Patient declined, no reason specified. Pt states she is just not ready to start PT today. Agreeable to eval tomorrow, if we can have her premedicated 1 hour prior. Explained to pt the importance of mobility. It is important to build rapport and not make her feel rushed.   Lorriane Shire 03/18/2016, 11:33 AM

## 2016-03-18 NOTE — Progress Notes (Signed)
Subjective: She says less pain. No breathing issues. She opens mouth better than yesterday  Objective: Vital signs in last 24 hours: Temp:  [98 F (36.7 C)-98.7 F (37.1 C)] 98 F (36.7 C) (10/13 1406) Pulse Rate:  [72-95] 74 (10/13 1406) Resp:  [16-26] 18 (10/13 1406) BP: (135-146)/(48-74) 141/64 (10/13 1406) SpO2:  [96 %-100 %] 96 % (10/13 1406) Weight:  [37.8 kg (83 lb 4.8 oz)] 37.8 kg (83 lb 4.8 oz) (10/12 2016) Last BM Date: 03/17/16 (ileostomy)  Intake/Output from previous day: 10/12 0701 - 10/13 0700 In: 2787.9 [P.O.:60; I.V.:2127.9; IV Piggyback:600] Out: 5809 [Urine:825; Stool:750] Intake/Output this shift: Total I/O In: -  Out: 650 [Urine:500; Stool:150]  opens mouth almost normal. normal voice. Left parotid still with significant swelling and tenderness but less erythema.   Lab Results:   Recent Labs  03/16/16 0210 03/17/16 2056  WBC 18.8* 16.2*  HGB 10.4* 9.1*  HCT 32.7* 29.0*  PLT 432* 467*   BMET  Recent Labs  03/16/16 0210 03/17/16 2056  NA 139 140  K 3.7 3.2*  CL 105 106  CO2 22 26  GLUCOSE 109* 116*  BUN 18 13  CREATININE 0.80 0.76  CALCIUM 8.5* 8.9   PT/INR No results for input(s): LABPROT, INR in the last 72 hours. ABG No results for input(s): PHART, HCO3 in the last 72 hours.  Invalid input(s): PCO2, PO2  Studies/Results: Dg Pelvis 1-2 Views  Result Date: 03/17/2016 CLINICAL DATA:  Osteoporosis EXAM: PELVIS - 1-2 VIEW COMPARISON:  None. FINDINGS: Dynamic compression screws are present in the left femoral neck with an intra medullary rod. Bones are osteopenia. No acute fracture. No dislocation. No breakage or loosening of the hardware. IMPRESSION: Postoperative change.  No acute bony pathology. Electronically Signed   By: Marybelle Killings M.D.   On: 03/17/2016 16:25   Dg Femur Min 2 Views Left  Result Date: 03/17/2016 CLINICAL DATA:  Left femur pain. EXAM: LEFT FEMUR 2 VIEWS COMPARISON:  Radiographs of March 05, 2016.  FINDINGS: Status post internal fixation of proximal left femoral neck fracture and intra medullary rod fixation of left femoral shaft. Good alignment of the fracture components is noted. Expected postoperative changes are seen in the surrounding soft tissues. IMPRESSION: Status post surgical internal fixation of proximal left femoral neck fracture with intra medullary rod fixation of left femoral shaft. No new fracture or dislocation is noted. Electronically Signed   By: Marijo Conception, M.D.   On: 03/17/2016 16:27    Anti-infectives: Anti-infectives    Start     Dose/Rate Route Frequency Ordered Stop   03/18/16 0600  vancomycin (VANCOCIN) 1,250 mg in sodium chloride 0.9 % 250 mL IVPB     1,250 mg 166.7 mL/hr over 90 Minutes Intravenous Every 24 hours 03/17/16 1119     03/17/16 1130  vancomycin (VANCOCIN) IVPB 1000 mg/200 mL premix     1,000 mg 200 mL/hr over 60 Minutes Intravenous  Once 03/17/16 1119 03/17/16 1235   03/17/16 0600  vancomycin (VANCOCIN) 500 mg in sodium chloride 0.9 % 100 mL IVPB  Status:  Discontinued     500 mg 100 mL/hr over 60 Minutes Intravenous Every 24 hours 03/15/16 1157 03/17/16 1119   03/15/16 1400  piperacillin-tazobactam (ZOSYN) IVPB 3.375 g     3.375 g 12.5 mL/hr over 240 Minutes Intravenous Every 8 hours 03/15/16 1157     03/15/16 0715  piperacillin-tazobactam (ZOSYN) IVPB 3.375 g     3.375 g 100 mL/hr over 30 Minutes Intravenous  Once 03/15/16 0714 03/15/16 0849   03/15/16 0715  vancomycin (VANCOCIN) IVPB 1000 mg/200 mL premix     1,000 mg 200 mL/hr over 60 Minutes Intravenous  Once 03/15/16 0714 03/15/16 0852      Assessment/Plan: s/p * No surgery found * The parotid still swollen and slowly responding. I thnk a CT scan of the neck on Saturday or Sunday might be appropriate to be sure there is no abscess.   LOS: 3 days    Kim [name] 03/18/2016

## 2016-03-18 NOTE — Progress Notes (Signed)
PROGRESS NOTE  Kim [name]  FMB:[id number] DOB: 12/11/1940 DOA: 03/15/2016 PCP: Mathews Argyle, MD   Brief Narrative: Kim [name] is a 75 y.o. female with a history of anemia, glaucoma, OA, crohn's s/p total colectomy (1984), internal ostomy pouch with recent sugical resection of stenosed small bowel / slipped koch pouch (02/02/16), CDiff colitis,osteoporosis s/p left femur fracture with IM nail 03/05/2016 who was discharged to [facility] returned 10/10 for left sided neck swelling and pain. She reports she noted pain at time of discharge in left neck which became acutely worse 10/9. She was found to be protecting her airway in the ER but she had muffled speech, difficulty with phonation, and limited movement of her neck.Initial labs - NA 134, K 3.9, SCr 0.93, glucose 144, WBC 17.2 and platelets 492.CT evaluation of the neck demonstrated severe trans-spatial inflammation affecting the left face / neck with source being an acute inflammation of both the left parotid and submandibular glands, inflammation &edema tracking throughout the superficial / deep left neck, no airway compromise at this time, no drainable fluid collection / abscess, no involvement of upper chest. She was started on vancomycin and zosyn, transferred to [facility] by request of ENT and admitted to [facility] service.    Assessment & Plan: Active Problems:   Sepsis ([place])   Sialadenitis  Acute parotitis:  - Continue empiric IV vanc/cefepime (10/10 >> )  - Heating pad often - Re-image with CT if progress stalls - ENT following - Aspiration precautions, nutrition consult - Decrease pain medication due to confusion - Titrate O2 to maintain SPO2 > 92% - Normal saline 131m/hr  Crohn's disease with ileostomy 01/2016 at [facility]: - No disease-specific medications currently - WOC consult for ileostomy  Pulmonary nodule: Incidentally noted on CXR during last admission.  - Radiologist recommending CT scan to better clarify - Can  be done as outpatient  DVT prophylaxis: Lovenox Code Status: Full Family Communication: Husband at bedside Disposition Plan: Ongoing treatment on med-surg. Anticipate SNF discharge, SW consulted.  Consultants:   ENT, Dr. JMelissa Montane Pulm/CCM, Dr. JTera Partridge Procedures:  Foley 10/10 >> PIV x2  Antimicrobials:  Vancomycin (10/10 >> )  Cefepime (10/10 >> )  Subjective: Pateint frustrated with slow progress of improvement in infection, but state it is overall getting better. No change in voice, trouble breathing.   Objective: Vitals:   03/17/16 1600 03/17/16 1700 03/17/16 2016 03/18/16 0603  BP: (!) 146/74 136/63 (!) 142/52 (!) 135/48  Pulse: 95 77 85 72  Resp: (!) 21 (!) 26 16 17   Temp:   98.7 F (37.1 C) 98.1 F (36.7 C)  TempSrc:   Oral Oral  SpO2: 100% 100% 100% 100%  Weight:   37.8 kg (83 lb 4.8 oz)   Height:   4' 9.6" (1.463 m)     Intake/Output Summary (Last 24 hours) at 03/18/16 1323 Last data filed at 03/18/16 1000  Gross per 24 hour  Intake          2137.92 ml  Output             1575 ml  Net           562.92 ml   Filed Weights   03/16/16 0345 03/17/16 2016  Weight: 79.9 kg (176 lb 2.4 oz) 37.8 kg (83 lb 4.8 oz)    Examination: General exam: 75y.o. female in no distress Neck: Left lateral lower face edematous and erythematous with significant induration and no fluctuance, extending down  left neck and behind left ear. Warm.  Respiratory system: Non-labored breathing, no stridor. Clear to auscultation bilaterally.  Cardiovascular system: Regular rate and rhythm. No murmur, rub, or gallop. No JVD, and no pedal edema. Gastrointestinal system: Abdomen soft, non-tender, non-distended, with normoactive bowel sounds. No organomegaly or masses felt. Ileostomy RLQ without drainage. Central nervous system: Alert and oriented. No focal neurological deficits. Extremities: Warm, no deformities Skin: No other lesions. Psychiatry: Judgement and insight  appear normal. Mood & affect appropriate.   Data Reviewed: I have personally reviewed following labs and imaging studies  CBC:  Recent Labs Lab 03/12/16 0419 03/15/16 0720 03/16/16 0210 03/17/16 2056  WBC 7.2 17.2* 18.8* 16.2*  NEUTROABS 6.2 16.0*  --   --   HGB 11.2* 12.9 10.4* 9.1*  HCT 34.4* 37.8 32.7* 29.0*  MCV 93.0 89.8 93.7 94.5  PLT 262 492* 432* 638*   Basic Metabolic Panel:  Recent Labs Lab 03/12/16 0419 03/15/16 0720 03/16/16 0210 03/17/16 2056  NA 137 134* 139 140  K 4.4 3.9 3.7 3.2*  CL 98* 99* 105 106  CO2 28 22 22 26   GLUCOSE 98 144* 109* 116*  BUN 19 27* 18 13  CREATININE 0.77 0.93 0.80 0.76  CALCIUM 9.5 9.6 8.5* 8.9   GFR: Estimated Creatinine Clearance: 36.8 mL/min (by C-G formula based on SCr of 0.76 mg/dL). Liver Function Tests:  Recent Labs Lab 03/15/16 0720  AST 20  ALT 21  ALKPHOS 78  BILITOT 1.3*  PROT 7.0  ALBUMIN 3.0*   No results for input(s): LIPASE, AMYLASE in the last 168 hours. No results for input(s): AMMONIA in the last 168 hours. Coagulation Profile: No results for input(s): INR, PROTIME in the last 168 hours. Cardiac Enzymes: No results for input(s): CKTOTAL, CKMB, CKMBINDEX, TROPONINI in the last 168 hours. BNP (last 3 results) No results for input(s): PROBNP in the last 8760 hours. HbA1C: No results for input(s): HGBA1C in the last 72 hours. CBG:  Recent Labs Lab 03/15/16 1535  GLUCAP 151*   Lipid Profile: No results for input(s): CHOL, HDL, LDLCALC, TRIG, CHOLHDL, LDLDIRECT in the last 72 hours. Thyroid Function Tests: No results for input(s): TSH, T4TOTAL, FREET4, T3FREE, THYROIDAB in the last 72 hours. Anemia Panel: No results for input(s): VITAMINB12, FOLATE, FERRITIN, TIBC, IRON, RETICCTPCT in the last 72 hours. Urine analysis:    Component Value Date/Time   COLORURINE YELLOW 03/11/2016 0736   APPEARANCEUR CLEAR 03/11/2016 0736   LABSPEC 1.025 03/11/2016 0736   PHURINE 5.0 03/11/2016 0736    GLUCOSEU NEGATIVE 03/11/2016 0736   HGBUR NEGATIVE 03/11/2016 0736   BILIRUBINUR SMALL (A) 03/11/2016 0736   BILIRUBINUR neg 08/22/2011 1010   KETONESUR NEGATIVE 03/11/2016 0736   PROTEINUR NEGATIVE 03/11/2016 0736   UROBILINOGEN negative 08/22/2011 1010   UROBILINOGEN 0.2 11/19/2007 0157   NITRITE NEGATIVE 03/11/2016 0736   LEUKOCYTESUR SMALL (A) 03/11/2016 0736   Sepsis Labs: @LABRCNTIP (procalcitonin:4,lacticidven:4)  ) Recent Results (from the past 240 hour(s))  Culture, blood (Routine x 2)     Status: None (Preliminary result)   Collection Time: 03/15/16  7:20 AM  Result Value Ref Range Status   Specimen Description BLOOD RIGHT WRIST  Final   Special Requests BOTTLES DRAWN AEROBIC AND ANAEROBIC 5CC  Final   Culture   Final    NO GROWTH 3 DAYS Performed at [facility]    Report Status PENDING  Incomplete  Culture, blood (Routine x 2)     Status: None (Preliminary result)   Collection  Time: 03/15/16  7:20 AM  Result Value Ref Range Status   Specimen Description BLOOD LEFT FOREARM  Final   Special Requests IN PEDIATRIC BOTTLE [place]  Final   Culture   Final    NO GROWTH 3 DAYS Performed at [facility]    Report Status PENDING  Incomplete  MRSA PCR Screening     Status: None   Collection Time: 03/15/16  3:20 PM  Result Value Ref Range Status   MRSA by PCR NEGATIVE NEGATIVE Final    Comment:        The GeneXpert MRSA Assay (FDA approved for NASAL specimens only), is one component of a comprehensive MRSA colonization surveillance program. It is not intended to diagnose MRSA infection nor to guide or monitor treatment for MRSA infections.      Radiology Studies: Dg Pelvis 1-2 Views  Result Date: 03/17/2016 CLINICAL DATA:  Osteoporosis EXAM: PELVIS - 1-2 VIEW COMPARISON:  None. FINDINGS: Dynamic compression screws are present in the left femoral neck with an intra medullary rod. Bones are osteopenia. No acute fracture. No dislocation. No breakage or  loosening of the hardware. IMPRESSION: Postoperative change.  No acute bony pathology. Electronically Signed   By: Marybelle Killings M.D.   On: 03/17/2016 16:25   Dg Femur Min 2 Views Left  Result Date: 03/17/2016 CLINICAL DATA:  Left femur pain. EXAM: LEFT FEMUR 2 VIEWS COMPARISON:  Radiographs of March 05, 2016. FINDINGS: Status post internal fixation of proximal left femoral neck fracture and intra medullary rod fixation of left femoral shaft. Good alignment of the fracture components is noted. Expected postoperative changes are seen in the surrounding soft tissues. IMPRESSION: Status post surgical internal fixation of proximal left femoral neck fracture with intra medullary rod fixation of left femoral shaft. No new fracture or dislocation is noted. Electronically Signed   By: Marijo Conception, M.D.   On: 03/17/2016 16:27    Scheduled Meds: . acetaminophen  650 mg Rectal Once  . acetaminophen  650 mg Oral Once  . dorzolamide-timolol  1 drop Both Eyes Q12H  . enoxaparin (LOVENOX) injection  40 mg Subcutaneous Q1200  . latanoprost  1 drop Both Eyes QHS  . piperacillin-tazobactam (ZOSYN)  IV  3.375 g Intravenous Q8H  . vancomycin  1,250 mg Intravenous Q24H   Continuous Infusions: . sodium chloride 100 mL/hr at 03/18/16 0842     LOS: 3 days   Time spent: 25 minutes.  Vance Gather, MD [facility] Pager [phone]  If 7PM-7AM, please contact night-coverage [email or web address] Password [email or web address] 03/18/2016, 1:23 PM

## 2016-03-18 NOTE — Progress Notes (Signed)
Speech Language Pathology Treatment: Dysphagia  Patient Details Name: Kim [name] MRN: [id number] DOB: Apr 20, 1941 Today's Date: 03/18/2016 Time: 1000-1015 SLP Time Calculation (min) (ACUTE ONLY): 15 min  Assessment / Plan / Recommendation Clinical Impression  Pt seen with am meal, tolerating mastication of soft solids with reduced but sufficient mandibular movement. No signs of aspiration with thin liquids today with consecutive straw sips. SLP questioned pt about feelings about softer foods, she did not offer complaint and SLP assisted her in ordering foods of choice consistent with her diet for lunch and dinner. Recommend pt continue current diet with intermittent supervision. Will f/u for tolerance and potential upgrade next week.    HPI HPI: 75 y/o F with PMH of anemia, glaucoma, osteoarthritis, Chron's disease s/p total colectomy (1984), internal ostomy pouch with recent sugical resection of stenosed small bowel / slipped Koch pouch (02/02/16), C-Diff colitis, &osteoporosis s/p L femur fracture with IM nail 03/05/16. Presents at ED with left neck pain and facial swelling. She has muffled speech, difficulty with phonation, and limited movement of her neck. CXR shows probable COPD, but no pneumonia nor CHF. CT of soft tissue of the neck showed inflammation and edema tracking throughout the superficial and      SLP Plan  Continue with current plan of care     Recommendations  Diet recommendations: Dysphagia 2 (fine chop);Thin liquid Liquids provided via: Cup;Straw Medication Administration: Whole meds with puree Supervision: Intermittent supervision to cue for compensatory strategies;Patient able to self feed Compensations: Minimize environmental distractions;Small sips/bites;Slow rate Postural Changes and/or Swallow Maneuvers: Seated upright 90 degrees;Upright 30-60 min after meal                Plan: Continue with current plan of care       GO               [facility], [place] CCC-SLP [phone]  Lynann Beaver 03/18/2016, 11:15 AM

## 2016-03-19 DIAGNOSIS — K1121 Acute sialoadenitis: Secondary | ICD-10-CM

## 2016-03-19 DIAGNOSIS — E43 Unspecified severe protein-calorie malnutrition: Secondary | ICD-10-CM

## 2016-03-19 MED ORDER — OXYCODONE-ACETAMINOPHEN 5-325 MG PO TABS
1.0000 | ORAL_TABLET | Freq: Four times a day (QID) | ORAL | Status: DC | PRN
  Administered 2016-03-21: 1 via ORAL
  Filled 2016-03-19 (×2): qty 1

## 2016-03-19 NOTE — Progress Notes (Signed)
   ENT Progress Note: HD # 4   Subjective: C/O cont pain and fatigue  Objective: Vital signs in last 24 hours: Temp:  [97.7 F (36.5 C)-98.2 F (36.8 C)] 97.7 F (36.5 C) (10/14 0433) Pulse Rate:  [69-74] 69 (10/14 0433) Resp:  [18-19] 18 (10/14 0433) BP: (135-148)/(57-64) 148/57 (10/14 0433) SpO2:  [92 %-98 %] 92 % (10/14 0433) Weight change:  Last BM Date: 03/17/16  Intake/Output from previous day: 10/13 0701 - 10/14 0700 In: 300 [P.O.:300] Out: 1100 [Urine:900; Stool:200] Intake/Output this shift: Total I/O In: -  Out: 925 [Urine:750; Stool:175]  Labs:  Recent Labs  03/17/16 2056  WBC 16.2*  HGB 9.1*  HCT 29.0*  PLT 467*    Recent Labs  03/17/16 2056  NA 140  K 3.2*  CL 106  CO2 26  GLUCOSE 116*  BUN 13  CALCIUM 8.9    Studies/Results: Dg Pelvis 1-2 Views  Result Date: 03/17/2016 CLINICAL DATA:  Osteoporosis EXAM: PELVIS - 1-2 VIEW COMPARISON:  None. FINDINGS: Dynamic compression screws are present in the left femoral neck with an intra medullary rod. Bones are osteopenia. No acute fracture. No dislocation. No breakage or loosening of the hardware. IMPRESSION: Postoperative change.  No acute bony pathology. Electronically Signed   By: Marybelle Killings M.D.   On: 03/17/2016 16:25   Dg Femur Min 2 Views Left  Result Date: 03/17/2016 CLINICAL DATA:  Left femur pain. EXAM: LEFT FEMUR 2 VIEWS COMPARISON:  Radiographs of March 05, 2016. FINDINGS: Status post internal fixation of proximal left femoral neck fracture and intra medullary rod fixation of left femoral shaft. Good alignment of the fracture components is noted. Expected postoperative changes are seen in the surrounding soft tissues. IMPRESSION: Status post surgical internal fixation of proximal left femoral neck fracture with intra medullary rod fixation of left femoral shaft. No new fracture or dislocation is noted. Electronically Signed   By: Marijo Conception, M.D.   On: 03/17/2016 16:27      PHYSICAL EXAM: Moderate Rt parotid swelling, improved from adm Resolved trismus    Assessment/Plan: Pt stable and improving Cont current tx and hydration Encourage po fluids, mouth rinse and sialogogues Pt refused CT - will follow clinically    Camree Wigington 03/19/2016, 10:49 AM

## 2016-03-19 NOTE — Progress Notes (Signed)
PROGRESS NOTE                                                                                                                                                                                                             Patient Demographics:    Kim [name], is a 75 y.o. female, DOB - 1940/12/06, NXG:[id number]  Admit date - 03/15/2016   Admitting Physician Chesley Mires, MD  Outpatient Primary MD for the patient is Mathews Argyle, MD  LOS - Mathews  Chief Complaint  Patient presents with  . 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 difficile colitis, osteoarthritis, osteoporosis, severe protein calorie malnutrition, anemia, glaucoma, left femur fracture with IM nail on 03/05/2016 was discharged to [place] to skilled nursing facility was brought to the ED with left-sided neck pain and swelling associated with parotitis. Patient admitted to critical care due to sepsis and ENT consulted. Transfer to hospice service once more stable.    Subjective:   Patient completed some pain in her neck. Refuses CT scan today.    Assessment  & Plan :    Active Problems:   Sepsis ([place]) Secondary to acute parotitis. Sepsis now resolved.  Acute parotitis Empiric vancomycin and cefepime. When necessary heating pad. ENT recommends repeat CT scan to rule out abscess if symptoms persistent. It appears that swelling has improved since admission and trismus has resolved. No need for urgent follow-up CT. IV fluids. Encourage by mouth intake. Nutrition consult.   Severe protein calorie malnutrition Dietitian consulted and added supplement.  Chronic disease with ileostomy Wound care following.  Pulmonary nodule Noted on chest x-ray incidentally during previous  hospitalization. Recommend CT scan for better clarification, can be done as outpatient.  Dysphagia   Recommend  dysphagia level II diet.   Code Status : Full code  Family Communication  : We will update thousand  Disposition Plan  : Possibly needs return to skilled nursing facility   Barriers For Discharge : Improving symptoms  Consults  :   ENT  Procedures  :  CT neck  DVT Prophylaxis  :  Lovenox -   Lab Results  Component Value Date   PLT 467 (H) 03/17/2016  Antibiotics  :   Anti-infectives    Start     Dose/Rate Route Frequency Ordered Stop   03/18/16 0600  vancomycin (VANCOCIN) 1,250 mg in sodium chloride 0.9 % 250 mL IVPB     1,250 mg 166.7 mL/hr over 90 Minutes Intravenous Every 24 hours 03/17/16 1119     03/17/16 1130  vancomycin (VANCOCIN) IVPB 1000 mg/200 mL premix     1,000 mg 200 mL/hr over 60 Minutes Intravenous  Once 03/17/16 1119 03/17/16 1235   03/17/16 0600  vancomycin (VANCOCIN) 500 mg in sodium chloride 0.9 % 100 mL IVPB  Status:  Discontinued     500 mg 100 mL/hr over 60 Minutes Intravenous Every 24 hours 03/15/16 1157 03/17/16 1119   03/15/16 1400  piperacillin-tazobactam (ZOSYN) IVPB 3.375 g     3.375 g 12.5 mL/hr over 240 Minutes Intravenous Every 8 hours 03/15/16 1157     03/15/16 0715  piperacillin-tazobactam (ZOSYN) IVPB 3.375 g     3.375 g 100 mL/hr over 30 Minutes Intravenous  Once 03/15/16 0714 03/15/16 0849   03/15/16 0715  vancomycin (VANCOCIN) IVPB 1000 mg/200 mL premix     1,000 mg 200 mL/hr over 60 Minutes Intravenous  Once 03/15/16 0714 03/15/16 0852        Objective:   Vitals:   03/18/16 0603 03/18/16 1406 03/18/16 2149 03/19/16 0433  BP: (!) 135/48 (!) 141/64 135/62 (!) 148/57  Pulse: 72 74 74 69  Resp: 17 18 19 18   Temp: 98.1 F (36.7 C) 98 F (36.7 C) 98.2 F (36.8 C) 97.7 F (36.5 C)  TempSrc: Oral Oral Oral   SpO2: 100% 96% 98% 92%  Weight:      Height:        Wt Readings from Last 3 Encounters:    03/17/16 37.8 kg (83 lb 4.8 oz)  03/07/16 35.4 kg (78 lb)  01/16/16 36.3 kg (80 lb)     Intake/Output Summary (Last 24 hours) at 03/19/16 1451 Last data filed at 03/19/16 1300  Gross per 24 hour  Intake                0 ml  Output             1575 ml  Net            -1575 ml     Physical Exam  Gen: Early thin built female not in distress, irritable HEENT: Left carotid swelling with erythema, able to open her mouth, moist mucosa Chest: clear b/l, no added sounds CVS: N S1&S2, no murmurs, GI: soft, NT, ND, BS+ ileostomy status Musculoskeletal: warm, no edema, CNS: Alert and oriented, nonfocal    Data Review:    CBC  Recent Labs Lab 03/15/16 0720 03/16/16 0210 03/17/16 2056  WBC 17.2* 18.8* 16.2*  HGB 12.9 10.4* 9.1*  HCT 37.8 32.7* 29.0*  PLT 492* 432* 467*  MCV 89.8 93.7 94.5  MCH 30.6 29.8 29.6  MCHC 34.1 31.8 31.4  RDW 15.5 16.2* 16.3*  LYMPHSABS 0.3*  --   --   MONOABS 0.9  --   --   EOSABS 0.0  --   --   BASOSABS 0.0  --   --     Chemistries   Recent Labs Lab 03/15/16 0720 03/16/16 0210 03/17/16 2056  NA 134* 139 140  K 3.9 3.7 3.2*  CL 99* 105 106  CO2 22 22 26   GLUCOSE 144* 109* 116*  BUN 27* 18 13  CREATININE 0.93 0.80  0.76  CALCIUM 9.6 8.5* 8.9  AST 20  --   --   ALT 21  --   --   ALKPHOS 78  --   --   BILITOT 1.3*  --   --    ------------------------------------------------------------------------------------------------------------------ No results for input(s): CHOL, HDL, LDLCALC, TRIG, CHOLHDL, LDLDIRECT in the last 72 hours.  No results found for: HGBA1C ------------------------------------------------------------------------------------------------------------------ No results for input(s): TSH, T4TOTAL, T3FREE, THYROIDAB in the last 72 hours.  Invalid input(s): FREET3 ------------------------------------------------------------------------------------------------------------------ No results for input(s): VITAMINB12,  FOLATE, FERRITIN, TIBC, IRON, RETICCTPCT in the last 72 hours.  Coagulation profile No results for input(s): INR, PROTIME in the last 168 hours.  No results for input(s): DDIMER in the last 72 hours.  Cardiac Enzymes No results for input(s): CKMB, TROPONINI, MYOGLOBIN in the last 168 hours.  Invalid input(s): CK ------------------------------------------------------------------------------------------------------------------ No results found for: BNP  Inpatient Medications  Scheduled Meds: . acetaminophen  650 mg Rectal Once  . acetaminophen  650 mg Oral Once  . dorzolamide-timolol  1 drop Both Eyes Q12H  . enoxaparin (LOVENOX) injection  40 mg Subcutaneous Q1200  . latanoprost  1 drop Both Eyes QHS  . piperacillin-tazobactam (ZOSYN)  IV  3.375 g Intravenous Q8H  . vancomycin  1,250 mg Intravenous Q24H   Continuous Infusions: . sodium chloride 100 mL/hr at 03/19/16 0603   PRN Meds:.iopamidol, morphine injection  Micro Results Recent Results (from the past 240 hour(s))  Culture, blood (Routine x 2)     Status: None (Preliminary result)   Collection Time: 03/15/16  7:20 AM  Result Value Ref Range Status   Specimen Description BLOOD RIGHT WRIST  Final   Special Requests BOTTLES DRAWN AEROBIC AND ANAEROBIC 5CC  Final   Culture   Final    NO GROWTH 4 DAYS Performed at [facility]    Report Status PENDING  Incomplete  Culture, blood (Routine x 2)     Status: None (Preliminary result)   Collection Time: 03/15/16  7:20 AM  Result Value Ref Range Status   Specimen Description BLOOD LEFT FOREARM  Final   Special Requests IN PEDIATRIC BOTTLE [place]  Final   Culture   Final    NO GROWTH 4 DAYS Performed at [facility]    Report Status PENDING  Incomplete  MRSA PCR Screening     Status: None   Collection Time: 03/15/16  3:20 PM  Result Value Ref Range Status   MRSA by PCR NEGATIVE NEGATIVE Final    Comment:        The GeneXpert MRSA Assay (FDA approved for  NASAL specimens only), is one component of a comprehensive MRSA colonization surveillance program. It is not intended to diagnose MRSA infection nor to guide or monitor treatment for MRSA infections.     Radiology Reports Dg Chest 1 View  Result Date: 03/05/2016 CLINICAL DATA:  Fall yesterday.  Pain. EXAM: CHEST 1 VIEW COMPARISON:  January 16, 2016 and November 19, 2007 FINDINGS: The heart, hila, and mediastinum are normal. No pneumothorax. No mass. There is a nodular density just to the right of the heart on the frontal view. This was not seen on previous studies. No other nodules. No focal infiltrate. IMPRESSION: Nodular density projected to the right of the heart on the frontal view. While this may represent artifact or confluence of shadows, a true nodule is not excluded. Recommend CT imaging for better evaluation. No acute abnormality. Electronically Signed   By: Dorise Bullion III M.D  On: 03/05/2016 12:55   Dg Chest 2 View  Result Date: 03/15/2016 CLINICAL DATA:  New onset fever, left-sided facial and neck swelling and redness, possible sepsis. Remote history of smoking. EXAM: CHEST  2 VIEW COMPARISON:  Chest x-ray of March 05, 2016 FINDINGS: The lungs are mildly hyperinflated. There remain coarse lung markings in the right infrahilar region medially. There are no air bronchograms. There is no pleural effusion or pneumothorax. The heart and pulmonary vascularity are normal. The bony thorax exhibits no acute abnormality. IMPRESSION: Persistent increased density in the right infrahilar region. Probable COPD. No pneumonia nor CHF. Chest CT scanning is again recommended for further evaluation of the suspected nodule in the right infrahilar region. Electronically Signed   By: David  Martinique M.D.   On: 03/15/2016 08:14   Dg Pelvis 1-2 Views  Result Date: 03/17/2016 CLINICAL DATA:  Osteoporosis EXAM: PELVIS - 1-2 VIEW COMPARISON:  None. FINDINGS: Dynamic compression screws are present in the  left femoral neck with an intra medullary rod. Bones are osteopenia. No acute fracture. No dislocation. No breakage or loosening of the hardware. IMPRESSION: Postoperative change.  No acute bony pathology. Electronically Signed   By: Marybelle Killings M.D.   On: 03/17/2016 16:25   Dg Pelvis 1-2 Views  Result Date: 03/05/2016 CLINICAL DATA:  Pain after fall EXAM: PELVIS - 1-2 VIEW COMPARISON:  None. FINDINGS: There is a mildly displaced left intertrochanteric hip fracture. No other bony abnormalities. IMPRESSION: Mildly displaced left intertrochanteric hip fracture. Electronically Signed   By: Dorise Bullion III M.D   On: 03/05/2016 12:57   Ct Head Wo Contrast  Result Date: 03/05/2016 CLINICAL DATA:  Patient fell down steps and hit head. EXAM: CT HEAD WITHOUT CONTRAST TECHNIQUE: Contiguous axial images were obtained from the base of the skull through the vertex without intravenous contrast. COMPARISON:  None. FINDINGS: Brain: Soft tissue swelling/ hematoma seen over the left posterior scalp. No underlying fracture identified. No subdural, epidural, or subarachnoid hemorrhage. The cerebellum, brainstem, and basal cisterns are normal. No acute cortical ischemia or infarct no mass, mass effect, or midline shift. Ventricles and sulci are normal for age. Vascular: Calcified atherosclerosis associated with the intracranial portions of the carotid arteries. Skull: No acute fractures are seen Sinuses/Orbits: There is a tiny amount of fluid in right maxillary sinus. No associated fracture. The paranasal sinuses, mastoid air cells, and middle ears are otherwise normal. Other: No other abnormalities. IMPRESSION: No acute intracranial process. Electronically Signed   By: Dorise Bullion III M.D   On: 03/05/2016 13:30   Ct Soft Tissue Neck W Contrast  Result Date: 03/15/2016 CLINICAL DATA:  75 year old female with jaw swelling, pain, fever, redness of the left face and neck. Initial encounter. EXAM: CT NECK WITH CONTRAST  TECHNIQUE: Multidetector CT imaging of the neck was performed using the standard protocol following the bolus administration of intravenous contrast. CONTRAST:  100 mL Isovue 370 COMPARISON:  Head CT without contrast 03/05/2016. FINDINGS: Pharynx and larynx: Negative larynx. Moderate severe left parapharyngeal space inflammation and soft tissue swelling. Generalized left lateral pharyngeal wall soft tissue swelling/edema. Associated retropharyngeal effusion. The inflammation extends from the superior left parapharyngeal space and nasopharynx through to the left piriform sinus. The right lateral pharynx and right parapharyngeal space are normal. Salivary glands: Negative sublingual space, but moderate to severe inflammation of the left parotid and submandibular gland with confluent inflammation tracking from the left parotid space through to the left submandibular space, in association with the widespread left parapharyngeal space  edema. Superimposed left lateral face and neck subcutaneous edema and skin thickening. Widespread edema/free fluid tracking in the left neck, including wrapping around the posterior left neck to the midline. There is edema within the left sternocleidomastoid muscle. No organized or drainable fluid collection. The inflamed left parotid and submandibular glands are hyper enhancing. There is no enlargement of the left submandibular duct. The left parotid duct is mostly obscured by dental streak artifact. There is a punctate sialolith along the anterior left submandibular gland on series 2, image 52. No other sialolithiasis identified. The right submandibular and right parotid glands are normal. Thyroid: Negative thyroid. Lymph nodes:  No right cervical lymphadenopathy. Left side cervical lymph nodes from the level 1 to level 3 stations are hyper enhancing but remain small, 5-6 mm short axis and smaller. No enlarged nodes. No cystic or necrotic nodes. Vascular: The right IJ is dominant and  patent. Non dominant and primarily drains the left face. The left EJ is patent. Bilateral major vascular structures in the neck and at the skullbase are patent. Limited intracranial: Negative. Visualized orbits: Negative, postoperative changes to both globes. Mastoids and visualized paranasal sinuses: Visualized paranasal sinuses and mastoids are stable and well pneumatized. Skeleton: The mandible is intact. The left mandible appears normal. No acute dental changes identified. Degenerative changes in the cervical spine. No acute osseous abnormality identified. Upper chest: Normal visualized superior mediastinum. Negative lung apices aside from mild right upper lobe bronchiectasis. No axillary lymphadenopathy. Other: None. IMPRESSION: 1. Severe trans-spatial inflammation affecting the left face and neck. The source appears to be acute inflammation of both the left parotid and submandibular glands. Favor acute infectious sialoadenitis. 2. Inflammation and edema tracking throughout the superficial and deep left neck including the left parapharyngeal, retropharyngeal spaces and diffusely involving the left lateral pharyngeal mucosal space. 3. No airway compromise at this time. No drainable fluid collection or abscess. No associated vascular thrombosis. 4. Small reactive lymph nodes throughout the left level 1 to level 3 nodal stations. 5. No involvement of the upper chest. Electronically Signed   By: Genevie Ann M.D.   On: 03/15/2016 09:18   Dg C-arm 1-60 Min  Result Date: 03/05/2016 CLINICAL DATA:  Left IM nail. EXAM: DG C-ARM 61-120 MIN; LEFT FEMUR 2 VIEWS COMPARISON:  Plain film of the left femur from earlier same day. FINDINGS: Four intraoperative fluoroscopic spot images show intra medullary nail fixation of the left femur fracture. Hardware appears intact and appropriately positioned. No evidence of surgical complicating feature. Fluoroscopy provided for 1 minutes 41 seconds. IMPRESSION: Intraoperative images  showing intramedullary fixation of the left femur fracture. No evidence of surgical complicating feature. Electronically Signed   By: Franki Cabot M.D.   On: 03/05/2016 20:44   Dg Femur Min 2 Views Left  Result Date: 03/17/2016 CLINICAL DATA:  Left femur pain. EXAM: LEFT FEMUR 2 VIEWS COMPARISON:  Radiographs of March 05, 2016. FINDINGS: Status post internal fixation of proximal left femoral neck fracture and intra medullary rod fixation of left femoral shaft. Good alignment of the fracture components is noted. Expected postoperative changes are seen in the surrounding soft tissues. IMPRESSION: Status post surgical internal fixation of proximal left femoral neck fracture with intra medullary rod fixation of left femoral shaft. No new fracture or dislocation is noted. Electronically Signed   By: Marijo Conception, M.D.   On: 03/17/2016 16:27   Dg Femur Min 2 Views Left  Result Date: 03/05/2016 CLINICAL DATA:  Left IM nail. EXAM: DG C-ARM  61-120 MIN; LEFT FEMUR 2 VIEWS COMPARISON:  Plain film of the left femur from earlier same day. FINDINGS: Four intraoperative fluoroscopic spot images show intra medullary nail fixation of the left femur fracture. Hardware appears intact and appropriately positioned. No evidence of surgical complicating feature. Fluoroscopy provided for 1 minutes 41 seconds. IMPRESSION: Intraoperative images showing intramedullary fixation of the left femur fracture. No evidence of surgical complicating feature. Electronically Signed   By: Franki Cabot M.D.   On: 03/05/2016 20:44   Dg Femur Min 2 Views Left  Result Date: 03/05/2016 CLINICAL DATA:  Pain after fall EXAM: LEFT FEMUR 2 VIEWS COMPARISON:  None. FINDINGS: There is an intertrochanteric fracture through the proximal left femur. No dislocation. No other fractures identified. IMPRESSION: Proximal left intertrochanteric femoral fracture. Electronically Signed   By: Dorise Bullion III M.D   On: 03/05/2016 12:56    Time Spent  in minutes  25   Louellen Molder M.D on 03/19/2016 at 2:51 PM  Between 7am to 7pm - Pager - [phone]  After 7pm go to [email or web address] - password [facility]  [facility] -  Office  [phone]

## 2016-03-19 NOTE — Progress Notes (Signed)
PT Cancellation Note  Patient Details Name: Kim [name] MRN: [id number] DOB: [birth year]   Cancelled Treatment:    Reason Eval/Treat Not Completed: Other (comment) (Pt is weak, not eating and in severe pain with her parotid i)nflammation.  Talked with nursing to get a plan together, including having the dietician check in to see if she can get something she is willing eat.   Ramond Dial 03/19/2016, 3:11 PM    Mee Hives, PT MS Acute Rehab Dept. Number: [place] [facility] [place]

## 2016-03-20 DIAGNOSIS — E876 Hypokalemia: Secondary | ICD-10-CM

## 2016-03-20 LAB — BASIC METABOLIC PANEL
ANION GAP: 10 (ref 5–15)
Anion gap: 11 (ref 5–15)
BUN: 5 mg/dL — AB (ref 6–20)
BUN: 5 mg/dL — AB (ref 6–20)
CALCIUM: 8 mg/dL — AB (ref 8.9–10.3)
CALCIUM: 7.8 mg/dL — AB (ref 8.9–10.3)
CO2: 27 mmol/L (ref 22–32)
CO2: 25 mmol/L (ref 22–32)
Chloride: 102 mmol/L (ref 101–111)
Chloride: 102 mmol/L (ref 101–111)
Creatinine, Ser: 0.97 mg/dL (ref 0.44–1.00)
Creatinine, Ser: 1.04 mg/dL — ABNORMAL HIGH (ref 0.44–1.00)
GFR calc Af Amer: >60 mL/min (ref >60)
GFR calc Af Amer: 60 mL/min — ABNORMAL LOW (ref >60)
GFR, EST NON AFRICAN AMERICAN: 56 mL/min — AB (ref >60)
GFR, EST NON AFRICAN AMERICAN: 52 mL/min — AB (ref >60)
Glucose, Bld: 126 mg/dL — ABNORMAL HIGH (ref 65–99)
Glucose, Bld: 103 mg/dL — ABNORMAL HIGH (ref 65–99)
POTASSIUM: 2.3 mmol/L — AB (ref 3.5–5.1)
POTASSIUM: 2.8 mmol/L — AB (ref 3.5–5.1)
SODIUM: 139 mmol/L (ref 135–145)
SODIUM: 138 mmol/L (ref 135–145)

## 2016-03-20 LAB — CBC
HCT: 30.6 % — ABNORMAL LOW (ref 36.0–46.0)
Hemoglobin: 9.9 g/dL — ABNORMAL LOW (ref 12.0–15.0)
MCH: 29.5 pg (ref 26.0–34.0)
MCHC: 32.4 g/dL (ref 30.0–36.0)
MCV: 91.1 fL (ref 78.0–100.0)
PLATELETS: 416 10*3/uL — AB (ref 150–400)
RBC: 3.36 MIL/uL — AB (ref 3.87–5.11)
RDW: 16 % — ABNORMAL HIGH (ref 11.5–15.5)
WBC: 13.6 10*3/uL — AB (ref 4.0–10.5)

## 2016-03-20 LAB — CULTURE, BLOOD (ROUTINE X 2)
Culture: NO GROWTH
Culture: NO GROWTH

## 2016-03-20 LAB — MAGNESIUM: MAGNESIUM: 1.1 mg/dL — AB (ref 1.7–2.4)

## 2016-03-20 MED ORDER — POTASSIUM CHLORIDE 10 MEQ/100ML IV SOLN
10.0000 meq | INTRAVENOUS | Status: AC
  Administered 2016-03-20 (×3): 10 meq via INTRAVENOUS
  Filled 2016-03-20 (×3): qty 100

## 2016-03-20 MED ORDER — POTASSIUM CHLORIDE CRYS ER 20 MEQ PO TBCR
40.0000 meq | EXTENDED_RELEASE_TABLET | ORAL | Status: AC
  Administered 2016-03-20: 40 meq via ORAL
  Filled 2016-03-20 (×2): qty 2

## 2016-03-20 MED ORDER — MAGNESIUM SULFATE 4 GM/100ML IV SOLN
4.0000 g | Freq: Once | INTRAVENOUS | Status: AC
  Administered 2016-03-20: 4 g via INTRAVENOUS
  Filled 2016-03-20: qty 100

## 2016-03-20 MED ORDER — ENOXAPARIN SODIUM 30 MG/0.3ML ~~LOC~~ SOLN
30.0000 mg | Freq: Every day | SUBCUTANEOUS | Status: DC
  Filled 2016-03-20: qty 0.3

## 2016-03-20 MED ORDER — POTASSIUM CHLORIDE CRYS ER 20 MEQ PO TBCR
40.0000 meq | EXTENDED_RELEASE_TABLET | ORAL | Status: AC
  Filled 2016-03-20: qty 2

## 2016-03-20 MED ORDER — POTASSIUM CHLORIDE CRYS ER 20 MEQ PO TBCR
40.0000 meq | EXTENDED_RELEASE_TABLET | ORAL | Status: AC
  Administered 2016-03-20: 40 meq via ORAL
  Filled 2016-03-20: qty 2

## 2016-03-20 MED ORDER — POTASSIUM CHLORIDE 10 MEQ/100ML IV SOLN
10.0000 meq | Freq: Once | INTRAVENOUS | Status: AC
  Administered 2016-03-20: 10 meq via INTRAVENOUS
  Filled 2016-03-20: qty 100

## 2016-03-20 NOTE — Progress Notes (Signed)
PROGRESS NOTE                                                                                                                                                                                                             Patient Demographics:    Kim [name], is a 75 y.o. female, DOB - 04-28-1941, HYW:[id number]  Admit date - 03/15/2016   Admitting Physician Chesley Mires, MD  Outpatient Primary MD for the patient is Mathews Argyle, MD  [place]  Chief Complaint  Patient presents with  . 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 difficile colitis, osteoarthritis, osteoporosis, severe protein calorie malnutrition, anemia, glaucoma, left femur fracture with IM nail on 03/05/2016 was discharged to [place] to skilled nursing facility was brought to the ED with left-sided neck pain and swelling associated with parotitis. Patient admitted to critical care due to sepsis and ENT consulted. Transfer to hospice service once more stable.    Subjective:   Face swelling and pain better.   Assessment  & Plan :     Principal problem Acute parotitis Empiric vancomycin and Zosyn. When necessary heating pad. ENT following. Symptoms appear to be improving, afebrile and trismus resolved.   Active Problems:   Sepsis ([place]) Secondary to acute parotitis. Sepsis now resolved.  Severe hypokalemia/hypomagnesemia Being replenished. Recheck lab in a.m.  Severe protein calorie malnutrition Dietitian consulted and added supplement.  Crohn's disease with ileostomy Wound care following.  Pulmonary nodule Noted on chest x-ray incidentally during previous hospitalization. Recommend CT scan for better clarification, can be done as outpatient.  Dysphagia   Recommend  dysphagia level II diet.   Code Status : Full code  Family Communication  : We will update thousand  Disposition Plan  : Possibly needs return to skilled nursing facility   Barriers For Discharge : Improving symptoms  Consults  :   ENT  Procedures  :  CT neck  DVT Prophylaxis  :  Lovenox -   Lab Results  Component Value Date   PLT 416 (H) 03/20/2016    Antibiotics  :   Anti-infectives    Start     Dose/Rate Route Frequency Ordered Stop  03/18/16 0600  vancomycin (VANCOCIN) 1,250 mg in sodium chloride 0.9 % 250 mL IVPB     1,250 mg 166.7 mL/hr over 90 Minutes Intravenous Every 24 hours 03/17/16 1119     03/17/16 1130  vancomycin (VANCOCIN) IVPB 1000 mg/200 mL premix     1,000 mg 200 mL/hr over 60 Minutes Intravenous  Once 03/17/16 1119 03/17/16 1235   03/17/16 0600  vancomycin (VANCOCIN) 500 mg in sodium chloride 0.9 % 100 mL IVPB  Status:  Discontinued     500 mg 100 mL/hr over 60 Minutes Intravenous Every 24 hours 03/15/16 1157 03/17/16 1119   03/15/16 1400  piperacillin-tazobactam (ZOSYN) IVPB 3.375 g     3.375 g 12.5 mL/hr over 240 Minutes Intravenous Every 8 hours 03/15/16 1157     03/15/16 0715  piperacillin-tazobactam (ZOSYN) IVPB 3.375 g     3.375 g 100 mL/hr over 30 Minutes Intravenous  Once 03/15/16 0714 03/15/16 0849   03/15/16 0715  vancomycin (VANCOCIN) IVPB 1000 mg/200 mL premix     1,000 mg 200 mL/hr over 60 Minutes Intravenous  Once 03/15/16 0714 03/15/16 0852        Objective:   Vitals:   03/19/16 0433 03/19/16 1921 03/19/16 2114 03/20/16 0510  BP: (!) 148/57 (!) 151/55 (!) 156/55 (!) 154/56  Pulse: 69 80 93 92  Resp: 18 19 16 19   Temp: 97.7 F (36.5 C) 98.4 F (36.9 C) 98.5 F (36.9 C) 97.6 F (36.4 C)  TempSrc:    Oral  SpO2: 92% 95% 92% 94%  Weight:      Height:        Wt Readings from Last 3 Encounters:  03/17/16 37.8 kg (83 lb 4.8 oz)  03/07/16 35.4 kg (78 lb)  01/16/16 36.3 kg (80 lb)     Intake/Output  Summary (Last 24 hours) at 03/20/16 1132 Last data filed at 03/20/16 0654  Gross per 24 hour  Intake             1650 ml  Output             1500 ml  Net              150 ml     Physical Exam  Gen: Early thin built female not in distress, irritable HEENT: Left Parotid swelling with erythema, improved from yesterday. able to open her mouth better Chest: clear b/l, no added sounds CVS: N S1&S2, no murmurs, GI: soft, NT, ND, BS+ ileostomy status Musculoskeletal: warm, no edema, CNS: Alert and oriented, nonfocal    Data Review:    CBC  Recent Labs Lab 03/15/16 0720 03/16/16 0210 03/17/16 2056 03/20/16 0521  WBC 17.2* 18.8* 16.2* 13.6*  HGB 12.9 10.4* 9.1* 9.9*  HCT 37.8 32.7* 29.0* 30.6*  PLT 492* 432* 467* 416*  MCV 89.8 93.7 94.5 91.1  MCH 30.6 29.8 29.6 29.5  MCHC 34.1 31.8 31.4 32.4  RDW 15.5 16.2* 16.3* 16.0*  LYMPHSABS 0.3*  --   --   --   MONOABS 0.9  --   --   --   EOSABS 0.0  --   --   --   BASOSABS 0.0  --   --   --     Chemistries   Recent Labs Lab 03/15/16 0720 03/16/16 0210 03/17/16 2056 03/20/16 0521 03/20/16 0800  NA 134* 139 140 139  --   K 3.9 3.7 3.2* 2.3*  --   CL 99* 105 106 102  --  CO2 22 22 26 27   --   GLUCOSE 144* 109* 116* 126*  --   BUN 27* 18 13 5*  --   CREATININE 0.93 0.80 0.76 0.97  --   CALCIUM 9.6 8.5* 8.9 8.0*  --   MG  --   --   --   --  1.1*  AST 20  --   --   --   --   ALT 21  --   --   --   --   ALKPHOS 78  --   --   --   --   BILITOT 1.3*  --   --   --   --    ------------------------------------------------------------------------------------------------------------------ No results for input(s): CHOL, HDL, LDLCALC, TRIG, CHOLHDL, LDLDIRECT in the last 72 hours.  No results found for: HGBA1C ------------------------------------------------------------------------------------------------------------------ No results for input(s): TSH, T4TOTAL, T3FREE, THYROIDAB in the last 72 hours.  Invalid input(s):  FREET3 ------------------------------------------------------------------------------------------------------------------ No results for input(s): VITAMINB12, FOLATE, FERRITIN, TIBC, IRON, RETICCTPCT in the last 72 hours.  Coagulation profile No results for input(s): INR, PROTIME in the last 168 hours.  No results for input(s): DDIMER in the last 72 hours.  Cardiac Enzymes No results for input(s): CKMB, TROPONINI, MYOGLOBIN in the last 168 hours.  Invalid input(s): CK ------------------------------------------------------------------------------------------------------------------ No results found for: BNP  Inpatient Medications  Scheduled Meds: . acetaminophen  650 mg Rectal Once  . acetaminophen  650 mg Oral Once  . dorzolamide-timolol  1 drop Both Eyes Q12H  . enoxaparin (LOVENOX) injection  40 mg Subcutaneous Q1200  . latanoprost  1 drop Both Eyes QHS  . magnesium sulfate 1 - 4 g bolus IVPB  4 g Intravenous Once  . piperacillin-tazobactam (ZOSYN)  IV  3.375 g Intravenous Q8H  . potassium chloride  40 mEq Oral Q4H  . vancomycin  1,250 mg Intravenous Q24H   Continuous Infusions: . sodium chloride 100 mL/hr at 03/20/16 0049   PRN Meds:.iopamidol, morphine injection, oxyCODONE-acetaminophen  Micro Results Recent Results (from the past 240 hour(s))  Culture, blood (Routine x 2)     Status: None (Preliminary result)   Collection Time: 03/15/16  7:20 AM  Result Value Ref Range Status   Specimen Description BLOOD RIGHT WRIST  Final   Special Requests BOTTLES DRAWN AEROBIC AND ANAEROBIC 5CC  Final   Culture   Final    NO GROWTH 4 DAYS Performed at [facility]    Report Status PENDING  Incomplete  Culture, blood (Routine x 2)     Status: None (Preliminary result)   Collection Time: 03/15/16  7:20 AM  Result Value Ref Range Status   Specimen Description BLOOD LEFT FOREARM  Final   Special Requests IN PEDIATRIC BOTTLE [place]  Final   Culture   Final    NO GROWTH 4  DAYS Performed at [facility]    Report Status PENDING  Incomplete  MRSA PCR Screening     Status: None   Collection Time: 03/15/16  3:20 PM  Result Value Ref Range Status   MRSA by PCR NEGATIVE NEGATIVE Final    Comment:        The GeneXpert MRSA Assay (FDA approved for NASAL specimens only), is one component of a comprehensive MRSA colonization surveillance program. It is not intended to diagnose MRSA infection nor to guide or monitor treatment for MRSA infections.     Radiology Reports Dg Chest 1 View  Result Date: 03/05/2016 CLINICAL DATA:  Fall yesterday.  Pain. EXAM:  CHEST 1 VIEW COMPARISON:  January 16, 2016 and November 19, 2007 FINDINGS: The heart, hila, and mediastinum are normal. No pneumothorax. No mass. There is a nodular density just to the right of the heart on the frontal view. This was not seen on previous studies. No other nodules. No focal infiltrate. IMPRESSION: Nodular density projected to the right of the heart on the frontal view. While this may represent artifact or confluence of shadows, a true nodule is not excluded. Recommend CT imaging for better evaluation. No acute abnormality. Electronically Signed   By: Dorise Bullion III M.D   On: 03/05/2016 12:55   Dg Chest 2 View  Result Date: 03/15/2016 CLINICAL DATA:  New onset fever, left-sided facial and neck swelling and redness, possible sepsis. Remote history of smoking. EXAM: CHEST  2 VIEW COMPARISON:  Chest x-ray of March 05, 2016 FINDINGS: The lungs are mildly hyperinflated. There remain coarse lung markings in the right infrahilar region medially. There are no air bronchograms. There is no pleural effusion or pneumothorax. The heart and pulmonary vascularity are normal. The bony thorax exhibits no acute abnormality. IMPRESSION: Persistent increased density in the right infrahilar region. Probable COPD. No pneumonia nor CHF. Chest CT scanning is again recommended for further evaluation of the suspected  nodule in the right infrahilar region. Electronically Signed   By: David  Martinique M.D.   On: 03/15/2016 08:14   Dg Pelvis 1-2 Views  Result Date: 03/17/2016 CLINICAL DATA:  Osteoporosis EXAM: PELVIS - 1-2 VIEW COMPARISON:  None. FINDINGS: Dynamic compression screws are present in the left femoral neck with an intra medullary rod. Bones are osteopenia. No acute fracture. No dislocation. No breakage or loosening of the hardware. IMPRESSION: Postoperative change.  No acute bony pathology. Electronically Signed   By: Marybelle Killings M.D.   On: 03/17/2016 16:25   Dg Pelvis 1-2 Views  Result Date: 03/05/2016 CLINICAL DATA:  Pain after fall EXAM: PELVIS - 1-2 VIEW COMPARISON:  None. FINDINGS: There is a mildly displaced left intertrochanteric hip fracture. No other bony abnormalities. IMPRESSION: Mildly displaced left intertrochanteric hip fracture. Electronically Signed   By: Dorise Bullion III M.D   On: 03/05/2016 12:57   Ct Head Wo Contrast  Result Date: 03/05/2016 CLINICAL DATA:  Patient fell down steps and hit head. EXAM: CT HEAD WITHOUT CONTRAST TECHNIQUE: Contiguous axial images were obtained from the base of the skull through the vertex without intravenous contrast. COMPARISON:  None. FINDINGS: Brain: Soft tissue swelling/ hematoma seen over the left posterior scalp. No underlying fracture identified. No subdural, epidural, or subarachnoid hemorrhage. The cerebellum, brainstem, and basal cisterns are normal. No acute cortical ischemia or infarct no mass, mass effect, or midline shift. Ventricles and sulci are normal for age. Vascular: Calcified atherosclerosis associated with the intracranial portions of the carotid arteries. Skull: No acute fractures are seen Sinuses/Orbits: There is a tiny amount of fluid in right maxillary sinus. No associated fracture. The paranasal sinuses, mastoid air cells, and middle ears are otherwise normal. Other: No other abnormalities. IMPRESSION: No acute intracranial  process. Electronically Signed   By: Dorise Bullion III M.D   On: 03/05/2016 13:30   Ct Soft Tissue Neck W Contrast  Result Date: 03/15/2016 CLINICAL DATA:  75 year old female with jaw swelling, pain, fever, redness of the left face and neck. Initial encounter. EXAM: CT NECK WITH CONTRAST TECHNIQUE: Multidetector CT imaging of the neck was performed using the standard protocol following the bolus administration of intravenous contrast. CONTRAST:  100 mL  Isovue 370 COMPARISON:  Head CT without contrast 03/05/2016. FINDINGS: Pharynx and larynx: Negative larynx. Moderate severe left parapharyngeal space inflammation and soft tissue swelling. Generalized left lateral pharyngeal wall soft tissue swelling/edema. Associated retropharyngeal effusion. The inflammation extends from the superior left parapharyngeal space and nasopharynx through to the left piriform sinus. The right lateral pharynx and right parapharyngeal space are normal. Salivary glands: Negative sublingual space, but moderate to severe inflammation of the left parotid and submandibular gland with confluent inflammation tracking from the left parotid space through to the left submandibular space, in association with the widespread left parapharyngeal space edema. Superimposed left lateral face and neck subcutaneous edema and skin thickening. Widespread edema/free fluid tracking in the left neck, including wrapping around the posterior left neck to the midline. There is edema within the left sternocleidomastoid muscle. No organized or drainable fluid collection. The inflamed left parotid and submandibular glands are hyper enhancing. There is no enlargement of the left submandibular duct. The left parotid duct is mostly obscured by dental streak artifact. There is a punctate sialolith along the anterior left submandibular gland on series 2, image 52. No other sialolithiasis identified. The right submandibular and right parotid glands are normal. Thyroid:  Negative thyroid. Lymph nodes:  No right cervical lymphadenopathy. Left side cervical lymph nodes from the level 1 to level 3 stations are hyper enhancing but remain small, 5-6 mm short axis and smaller. No enlarged nodes. No cystic or necrotic nodes. Vascular: The right IJ is dominant and patent. Non dominant and primarily drains the left face. The left EJ is patent. Bilateral major vascular structures in the neck and at the skullbase are patent. Limited intracranial: Negative. Visualized orbits: Negative, postoperative changes to both globes. Mastoids and visualized paranasal sinuses: Visualized paranasal sinuses and mastoids are stable and well pneumatized. Skeleton: The mandible is intact. The left mandible appears normal. No acute dental changes identified. Degenerative changes in the cervical spine. No acute osseous abnormality identified. Upper chest: Normal visualized superior mediastinum. Negative lung apices aside from mild right upper lobe bronchiectasis. No axillary lymphadenopathy. Other: None. IMPRESSION: 1. Severe trans-spatial inflammation affecting the left face and neck. The source appears to be acute inflammation of both the left parotid and submandibular glands. Favor acute infectious sialoadenitis. 2. Inflammation and edema tracking throughout the superficial and deep left neck including the left parapharyngeal, retropharyngeal spaces and diffusely involving the left lateral pharyngeal mucosal space. 3. No airway compromise at this time. No drainable fluid collection or abscess. No associated vascular thrombosis. 4. Small reactive lymph nodes throughout the left level 1 to level 3 nodal stations. 5. No involvement of the upper chest. Electronically Signed   By: Genevie Ann M.D.   On: 03/15/2016 09:18   Dg C-arm 1-60 Min  Result Date: 03/05/2016 CLINICAL DATA:  Left IM nail. EXAM: DG C-ARM 61-120 MIN; LEFT FEMUR 2 VIEWS COMPARISON:  Plain film of the left femur from earlier same day. FINDINGS:  Four intraoperative fluoroscopic spot images show intra medullary nail fixation of the left femur fracture. Hardware appears intact and appropriately positioned. No evidence of surgical complicating feature. Fluoroscopy provided for 1 minutes 41 seconds. IMPRESSION: Intraoperative images showing intramedullary fixation of the left femur fracture. No evidence of surgical complicating feature. Electronically Signed   By: Franki Cabot M.D.   On: 03/05/2016 20:44   Dg Femur Min 2 Views Left  Result Date: 03/17/2016 CLINICAL DATA:  Left femur pain. EXAM: LEFT FEMUR 2 VIEWS COMPARISON:  Radiographs of March 05, 2016. FINDINGS: Status post internal fixation of proximal left femoral neck fracture and intra medullary rod fixation of left femoral shaft. Good alignment of the fracture components is noted. Expected postoperative changes are seen in the surrounding soft tissues. IMPRESSION: Status post surgical internal fixation of proximal left femoral neck fracture with intra medullary rod fixation of left femoral shaft. No new fracture or dislocation is noted. Electronically Signed   By: Marijo Conception, M.D.   On: 03/17/2016 16:27   Dg Femur Min 2 Views Left  Result Date: 03/05/2016 CLINICAL DATA:  Left IM nail. EXAM: DG C-ARM 61-120 MIN; LEFT FEMUR 2 VIEWS COMPARISON:  Plain film of the left femur from earlier same day. FINDINGS: Four intraoperative fluoroscopic spot images show intra medullary nail fixation of the left femur fracture. Hardware appears intact and appropriately positioned. No evidence of surgical complicating feature. Fluoroscopy provided for 1 minutes 41 seconds. IMPRESSION: Intraoperative images showing intramedullary fixation of the left femur fracture. No evidence of surgical complicating feature. Electronically Signed   By: Franki Cabot M.D.   On: 03/05/2016 20:44   Dg Femur Min 2 Views Left  Result Date: 03/05/2016 CLINICAL DATA:  Pain after fall EXAM: LEFT FEMUR 2 VIEWS COMPARISON:   None. FINDINGS: There is an intertrochanteric fracture through the proximal left femur. No dislocation. No other fractures identified. IMPRESSION: Proximal left intertrochanteric femoral fracture. Electronically Signed   By: Dorise Bullion III M.D   On: 03/05/2016 12:56    Time Spent in minutes  25   Louellen Molder M.D on 03/20/2016 at 11:32 AM  Between 7am to 7pm - Pager - [phone]  After 7pm go to [email or web address] - password [email or web address]  [facility] -  Office  [phone]

## 2016-03-20 NOTE — Progress Notes (Signed)
   ENT Progress Note: HD#5   Subjective: Pt reports improved pain  Objective: Vital signs in last 24 hours: Temp:  [97.6 F (36.4 C)-98.5 F (36.9 C)] 97.6 F (36.4 C) (10/15 0510) Pulse Rate:  [80-93] 92 (10/15 0510) Resp:  [16-19] 19 (10/15 0510) BP: (151-156)/(55-56) 154/56 (10/15 0510) SpO2:  [92 %-95 %] 94 % (10/15 0510) Weight change:  Last BM Date: 03/20/16  Intake/Output from previous day: 10/14 0701 - 10/15 0700 In: 1650 [I.V.:1200; IV Piggyback:450] Out: 2425 [Urine:1950; Stool:475] Intake/Output this shift: No intake/output data recorded.  Labs:  Recent Labs  03/17/16 2056 03/20/16 0521  WBC 16.2* 13.6*  HGB 9.1* 9.9*  HCT 29.0* 30.6*  PLT 467* 416*    Recent Labs  03/17/16 2056 03/20/16 0521  NA 140 139  K 3.2* 2.3*  CL 106 102  CO2 26 27  GLUCOSE 116* 126*  BUN 13 5*  CALCIUM 8.9 8.0*    Studies/Results: No results found.   PHYSICAL EXAM: Cont swelling and erythema of Rt parotid region  Decreased tenderness Dry oral mucosa   Assessment/Plan: Pt afebrile and WBC trending down ?CT - Pt refused Cont current tx and monitor clinical response rec sialogogues    Kim [name] 03/20/2016, 9:09 AM

## 2016-03-20 NOTE — Progress Notes (Signed)
Pt. refusing to take her po potassium this am.  Dr. Clementeen Graham text paged and informed, will continue to monitor and await a response.  Alphonzo Lemmings, RN

## 2016-03-20 NOTE — Progress Notes (Signed)
Pharmacy Antibiotic Note  Kim [name] is a 75 y.o. female admitted on 03/15/2016 with parotitis.  Pharmacy has been consulted for Vancomycin and Zosyn dosing. Today is day #6 of vanc and zosyn. No abscess noted on neck CT, so will target vancomycin trough of 10-15. WBC down trending to 13.6, afebrile. Pt now with good urine output and SCr stable ~83m/dL.  Plan: -Continue vancomycin 1250 mg q24 hrs -Continue Zosyn 3.375 g q8h -Follow-up length of therapy, culture data, S/Sx infection -Check vancomycin trough 0530 on 10/16  Height: 4' 9.6" (146.3 cm) Weight: 83 lb 4.8 oz (37.8 kg) IBW/kg (Calculated) : 39.98  Temp (24hrs), Avg:98.2 F (36.8 C), Min:97.6 F (36.4 C), Max:98.5 F (36.9 C)   Recent Labs Lab 03/15/16 0720 03/15/16 0739 03/15/16 1028 03/15/16 1222 03/16/16 0210 03/17/16 2056 03/20/16 0521 03/20/16 1050  WBC 17.2*  --   --   --  18.8* 16.2* 13.6*  --   CREATININE 0.93  --   --   --  0.80 0.76 0.97 1.04*  LATICACIDVEN  --  1.29 1.96* 1.05  --   --   --   --     Estimated Creatinine Clearance: 28.3 mL/min (by C-G formula based on SCr of 1.04 mg/dL (H)).    Allergies  Allergen Reactions  . Antihistamines, Chlorpheniramine-Type     Dry  Mouth, dry eyes-so dry she cannot even talk  . Metronidazole     Peripheral neuropathy  . Other Diarrhea and Nausea And Vomiting    Patient has an Ileostomy  . Sulfa Antibiotics Other (See Comments)    UNKNOWN  . Tape Other (See Comments)    SKIN WILL TEAR WITH ANYTHING OTHER THAN PAPER TAPE OR COBAN WRAP    Antimicrobials this admission: Zosyn 10/10 >>  Vanc 10/10 >>   Dose adjustments this admission: 10/12 Vancomycin: Increased maintenance dose given Wt 79kg and CrCl ~52. Pt already received 5046mthis morning, so reloaded with 1gm to get close to new maintenance dose of 125042mhile also giving extra to compensate for likely subtherapeutic dosing beforehand.   Microbiology results: 10/10 BCx: ngtd 10/10 MRSA pcr:  negative  Thank you for allowing pharmacy to be a part of this patient's care.  CarCarlean Jewsharm.D. PGY1 Pharmacy Resident 10/15/20171:41 PM Pager [phone]

## 2016-03-20 NOTE — Progress Notes (Signed)
CRITICAL VALUE ALERT  Critical value received:  Potassium 2.3  Date of notification:  03/20/2016  Time of notification:  6:23AM  Critical value read back:Yes.    Nurse who received alert:  Lubertha South  MD notified (1st page):  Maudie Mercury  Time of first page:  6:26AM  Responding MD:  Maudie Mercury  Time MD responded: 6:29AM MD ordered 4 IV runs of potassium.

## 2016-03-21 DIAGNOSIS — R911 Solitary pulmonary nodule: Secondary | ICD-10-CM

## 2016-03-21 DIAGNOSIS — K1121 Acute sialoadenitis: Secondary | ICD-10-CM | POA: Diagnosis present

## 2016-03-21 DIAGNOSIS — D508 Other iron deficiency anemias: Secondary | ICD-10-CM

## 2016-03-21 DIAGNOSIS — K50813 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: Secondary | ICD-10-CM

## 2016-03-21 DIAGNOSIS — E871 Hypo-osmolality and hyponatremia: Secondary | ICD-10-CM

## 2016-03-21 LAB — BASIC METABOLIC PANEL
Anion gap: 8 (ref 5–15)
BUN: 6 mg/dL (ref 6–20)
CALCIUM: 7.9 mg/dL — AB (ref 8.9–10.3)
CO2: 26 mmol/L (ref 22–32)
Chloride: 103 mmol/L (ref 101–111)
Creatinine, Ser: 1.16 mg/dL — ABNORMAL HIGH (ref 0.44–1.00)
GFR, EST AFRICAN AMERICAN: 52 mL/min — AB (ref >60)
GFR, EST NON AFRICAN AMERICAN: 45 mL/min — AB (ref >60)
GLUCOSE: 128 mg/dL — AB (ref 65–99)
POTASSIUM: 3.2 mmol/L — AB (ref 3.5–5.1)
SODIUM: 137 mmol/L (ref 135–145)

## 2016-03-21 LAB — MAGNESIUM: MAGNESIUM: 2.2 mg/dL (ref 1.7–2.4)

## 2016-03-21 LAB — VANCOMYCIN, TROUGH: Vancomycin Tr: 22 ug/mL (ref 15–20)

## 2016-03-21 MED ORDER — POTASSIUM CHLORIDE CRYS ER 20 MEQ PO TBCR
40.0000 meq | EXTENDED_RELEASE_TABLET | Freq: Once | ORAL | Status: AC
  Administered 2016-03-21: 40 meq via ORAL
  Filled 2016-03-21: qty 2

## 2016-03-21 MED ORDER — AMOXICILLIN-POT CLAVULANATE 875-125 MG PO TABS
1.0000 | ORAL_TABLET | Freq: Two times a day (BID) | ORAL | Status: DC
  Filled 2016-03-21: qty 1

## 2016-03-21 MED ORDER — CLINDAMYCIN HCL 300 MG PO CAPS: 300.0000 mg | ORAL_CAPSULE | Freq: Four times a day (QID) | ORAL | 0 refills | Status: AC

## 2016-03-21 MED ORDER — AMOXICILLIN-POT CLAVULANATE 500-125 MG PO TABS: 1.0000 | ORAL_TABLET | Freq: Two times a day (BID) | ORAL | Status: DC

## 2016-03-21 MED ORDER — OXYCODONE-ACETAMINOPHEN 7.5-325 MG PO TABS: 1.0000 | ORAL_TABLET | ORAL | 0 refills | Status: DC | PRN

## 2016-03-21 MED ORDER — VANCOMYCIN HCL IN DEXTROSE 750-5 MG/150ML-% IV SOLN
750.0000 mg | INTRAVENOUS | Status: DC
  Filled 2016-03-21: qty 150

## 2016-03-21 MED ORDER — SACCHAROMYCES BOULARDII 250 MG PO CAPS: 250.0000 mg | ORAL_CAPSULE | Freq: Two times a day (BID) | ORAL | 0 refills | Status: DC

## 2016-03-21 NOTE — Progress Notes (Signed)
Kristopher Glee to be D/C'd to [facility] skilled nursing facility per MD order.  Discussed with the patient and all questions fully answered.  VSS, Skin clean, dry and intact without evidence of skin break down, no evidence of skin tears noted. IV catheter discontinued intact. Site without signs and symptoms of complications. Dressing and pressure applied.  An After Visit Summary was printed and given to the [place]. [facility] received prescriptions.  D/c education completed with patient/family including follow up instructions, medication list, d/c activities limitations if indicated, with other d/c instructions as indicated by MD - patient able to verbalize understanding, all questions fully answered.   Patient instructed to return to ED, call 911, or call MD for any changes in condition.   Patient escorted via stretcher, and D/C to SNF via [facility].  Morley Kos Price 03/21/2016 5:34 PM

## 2016-03-21 NOTE — Progress Notes (Signed)
Brief Nutrition Follow-Up Note  RD received consult for assessment of nutritional status/needs. Initial assessment completed on 03/18/16; refer to RD note for further details.   Attempted to speak with pt x 3, however, pt was receiving nursing care at times of visits. Staff report that pt will d/c back to SNF today.  Pt continues to eat poorly; PT noted that pt ate a few bites of peaches and yogurt for breakfast. RD has ordered between meal nourishments through nutritional services. Pt does not like oral nutritional supplements. Due to pt's impending discharge, will defer intake concerns to RD at SNF. If pt continues to eat poorly, consider addition of appetite stimulant to increase oral intake.   Nikos Anglemyer A. Jimmye Norman, RD, LDN, CDE Pager: [phone] After hours Pager: [phone]

## 2016-03-21 NOTE — Clinical Social Work Placement (Signed)
   CLINICAL SOCIAL WORK PLACEMENT  NOTE  Date:  03/21/2016  Patient Details  Name: Kim [name] MRN: [id number] Date of Birth: [birth year]  Clinical Social Work is seeking post-discharge placement for this patient at the [place] level of care (*CSW will initial, date and re-position this form in  chart as items are completed):  Yes   Patient/family provided with [place] Work Department's list of facilities offering this level of care within the geographic area requested by the patient (or if unable, by the patient's family).  Yes   Patient/family informed of their freedom to choose among providers that offer the needed level of care, that participate in Medicare, Medicaid or managed care program needed by the patient, have an available bed and are willing to accept the patient.      Patient/family informed of [facility]'s ownership interest in [facility] and [facility], as well as of the fact that they are under no obligation to receive care at these facilities.  PASRR submitted to EDS on       PASRR number received on       Existing PASRR number confirmed on 03/18/16     FL2 transmitted to all facilities in geographic area requested by pt/family on 03/18/16     FL2 transmitted to all facilities within larger geographic area on 03/18/16     Patient informed that his/her managed care company has contracts with or will negotiate with certain facilities, including the following:        Yes   Patient/family informed of bed offers received.  Patient [facility] bed at [facility]     Physician recommends and patient chooses bed at      Patient to be transferred to [facility] on 03/21/16.  Patient to be transferred to facility by [facility]     Patient family notified on 03/21/16   of transfer.  Name of family member notified: Meklit Cotta (husband)    PHYSICIAN    Additional Comment:   Clinical Social Worker facilitated patient  discharge including contacting patient family and facility to confirm patient discharge plans.  Clinical information faxed to facility and family agreeable with plan.  CSW arranged ambulance transport via [facility] to [facility].  RN to call report prior to discharge.  Clinical Social Worker will sign off for now as social work intervention is no longer needed. Please consult [place] again if new need arises.   _______________________________________________ Sela Hilding, [place]

## 2016-03-21 NOTE — Discharge Summary (Signed)
Physician Discharge Summary  Kim [name] FGH:[id number] DOB: 06/03/1941 DOA: 03/15/2016  PCP: Mathews Argyle, MD  Admit date: 03/15/2016 Discharge date: 03/21/2016  Admitted From: SNF Disposition:  SNF  Recommendations for Outpatient Follow-up:  #1 Follow up with MD at SNF in 1 week. Patient is still on Lovenox for DVT prophylaxis until end of this month. Outpatient orthopedic follow-up as scheduled. -when necessary heating pad,  frequent mouth rinses and sialagogues.   #2 completes 2 weeks of abx on 03/28/2016. Please refer patient to ENT Dr. Janace Hoard if symptoms unimproved despite adequate antibiotics. #3 Recommend palliative care evaluation at the facility. #4 please obtain chest CT to further evaluate pulmonary nodules seen on chest x-ray.  Equipment/Devices:Per PT at SNF  Discharge Condition:Guarded CODE STATUS: Full code Diet recommendation: Dysphagia level II with supplement.    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 difficile colitis, osteoarthritis, osteoporosis, severe protein calorie malnutrition, anemia, glaucoma, left femur fracture with IM nail on 03/05/2016 was discharged to  skilled nursing facility was brought to the ED with left-sided neck pain and swelling associated with parotitis. Patient admitted to critical care due to sepsis and ENT consulted. Transferred to hospitalist service once more stable.  Hospital course Principal  problem Sepsis secondary to Acute parotitis Placed on Empiric vancomycin and Zosyn.  applied when necessary heating pad, increased by mouth fluid intake, frequent mouth rinses and sialagogues.   Symptoms appear to be improving, afebrile and trismus resolved. Appreciate ENT follow-up. Symptoms improving. Will discharged on oral clindamycin to complete total 7 days of antibiotics. Added florastor.   Active Problems:   Sepsis ([place]) Secondary to acute parotitis. Sepsis now resolved.  Severe hypokalemia/hypomagnesemia Replenished.  Severe protein calorie malnutrition Dietitian consulted and added supplement.  Crohn's disease with ileostomy Managed by wound care.  Pulmonary nodule Noted on chest x-ray incidentally during previous hospitalization. Recommend CT scan for better clarification, can be done as outpatient.  Dysphagia   Recommend  dysphagia level II diet.  Recent left hip fracture with IM nailing on 9/30 Continue Lovenox for DVT prophylaxis for 4 weeks (until 10/30). Follow up with [facility] as needed. Physical therapy at the facility.  Code Status : Full code  Family Communication  : Husband updated on the phone  Disposition Plan  :  return to skilled nursing facility    Consults  :   ENT ( Dr Janace Hoard)  Procedures  :  CT neck  Discharge Instructions     Medication List    STOP taking these medications   HYDROcodone-acetaminophen 5-325 MG tablet Commonly known as:  NORCO/VICODIN     TAKE these medications   acetaminophen 500 MG tablet Commonly known as:  TYLENOL Take 500 mg by mouth 2 (two) times daily.   cholecalciferol 1000 units tablet Commonly known as:  VITAMIN D Take 2,000 Units by mouth daily.   clindamycin 300 MG capsule Commonly known as:  CLEOCIN Take 1 capsule (300 mg total) by mouth 4 (four) times daily.  cyanocobalamin 1000 MCG/ML injection Commonly known as:  (VITAMIN B-12) INJECT 1 MILLILITER INTO MUSCLE  EVERY 30 DAYS   dorzolamide-timolol 22.3-6.8 MG/ML ophthalmic solution Commonly known as:  COSOPT Place 1 drop into both eyes every 12 (twelve) hours.   enoxaparin 40 MG/0.4ML injection Commonly known as:  LOVENOX Inject 0.4 mLs (40 mg total) into the skin daily.   ferrous sulfate 325 (65 FE) MG tablet Commonly known as:  FERROUSUL Take 1 tablet (325 mg total) by mouth daily with breakfast.   gabapentin 300 MG capsule Commonly known as:  NEURONTIN Take 300 mg by mouth 3 (three) times daily.   latanoprost 0.005 % ophthalmic solution Commonly known as:  XALATAN Place 1 drop into both eyes at bedtime.   Melatonin 10 MG Tbcr Take 10 mg by mouth at bedtime as needed for sleep.   ONE-A-DAY WOMENS 50+ ADVANTAGE Tabs Take 1 tablet by mouth daily.   oxyCODONE-acetaminophen 7.5-325 MG tablet Commonly known as:  PERCOCET Take 1 tablet by mouth every 4 (four) hours as needed for moderate pain. What changed:  additional instructions   saccharomyces boulardii 250 MG capsule Commonly known as:  FLORASTOR Take 1 capsule (250 mg total) by mouth 2 (two) times daily.      Follow-up Information    MD at SNF Follow up in 1 week(s).          Allergies  Allergen Reactions  . Antihistamines, Chlorpheniramine-Type     Dry  Mouth, dry eyes-so dry she cannot even talk  . Metronidazole     Peripheral neuropathy  . Other Diarrhea and Nausea And Vomiting    Patient has an Ileostomy  . Sulfa Antibiotics Other (See Comments)    UNKNOWN  . Tape Other (See Comments)    SKIN WILL TEAR WITH ANYTHING OTHER THAN PAPER TAPE OR COBAN WRAP      Procedures/Studies: Dg Chest 1 View  Result Date: 03/05/2016 CLINICAL DATA:  Fall yesterday.  Pain. EXAM: CHEST 1 VIEW COMPARISON:  January 16, 2016 and November 19, 2007 FINDINGS: The heart, hila, and mediastinum are normal. No pneumothorax. No mass. There is a nodular density just to the right of the heart on the frontal view. This was not seen on previous  studies. No other nodules. No focal infiltrate. IMPRESSION: Nodular density projected to the right of the heart on the frontal view. While this may represent artifact or confluence of shadows, a true nodule is not excluded. Recommend CT imaging for better evaluation. No acute abnormality. Electronically Signed   By: Dorise Bullion III M.D   On: 03/05/2016 12:55   Dg Chest 2 View  Result Date: 03/15/2016 CLINICAL DATA:  New onset fever, left-sided facial and neck swelling and redness, possible sepsis. Remote history of smoking. EXAM: CHEST  2 VIEW COMPARISON:  Chest x-ray of March 05, 2016 FINDINGS: The lungs are mildly hyperinflated. There remain coarse lung markings in the right infrahilar region medially. There are no air bronchograms. There is no pleural effusion or pneumothorax. The heart and pulmonary vascularity are normal. The bony thorax exhibits no acute abnormality. IMPRESSION: Persistent increased density in the right infrahilar region. Probable COPD. No pneumonia nor CHF. Chest CT scanning is again recommended for further evaluation of the suspected nodule in the right infrahilar region. Electronically Signed   By: David  Martinique M.D.   On: 03/15/2016 08:14   Dg Pelvis 1-2 Views  Result Date: 03/17/2016 CLINICAL DATA:  Osteoporosis EXAM: PELVIS - 1-2 VIEW COMPARISON:  None. FINDINGS:  Dynamic compression screws are present in the left femoral neck with an intra medullary rod. Bones are osteopenia. No acute fracture. No dislocation. No breakage or loosening of the hardware. IMPRESSION: Postoperative change.  No acute bony pathology. Electronically Signed   By: Marybelle Killings M.D.   On: 03/17/2016 16:25   Dg Pelvis 1-2 Views  Result Date: 03/05/2016 CLINICAL DATA:  Pain after fall EXAM: PELVIS - 1-2 VIEW COMPARISON:  None. FINDINGS: There is a mildly displaced left intertrochanteric hip fracture. No other bony abnormalities. IMPRESSION: Mildly displaced left intertrochanteric hip fracture.  Electronically Signed   By: Dorise Bullion III M.D   On: 03/05/2016 12:57   Ct Head Wo Contrast  Result Date: 03/05/2016 CLINICAL DATA:  Patient fell down steps and hit head. EXAM: CT HEAD WITHOUT CONTRAST TECHNIQUE: Contiguous axial images were obtained from the base of the skull through the vertex without intravenous contrast. COMPARISON:  None. FINDINGS: Brain: Soft tissue swelling/ hematoma seen over the left posterior scalp. No underlying fracture identified. No subdural, epidural, or subarachnoid hemorrhage. The cerebellum, brainstem, and basal cisterns are normal. No acute cortical ischemia or infarct no mass, mass effect, or midline shift. Ventricles and sulci are normal for age. Vascular: Calcified atherosclerosis associated with the intracranial portions of the carotid arteries. Skull: No acute fractures are seen Sinuses/Orbits: There is a tiny amount of fluid in right maxillary sinus. No associated fracture. The paranasal sinuses, mastoid air cells, and middle ears are otherwise normal. Other: No other abnormalities. IMPRESSION: No acute intracranial process. Electronically Signed   By: Dorise Bullion III M.D   On: 03/05/2016 13:30   Ct Soft Tissue Neck W Contrast  Result Date: 03/15/2016 CLINICAL DATA:  75 year old female with jaw swelling, pain, fever, redness of the left face and neck. Initial encounter. EXAM: CT NECK WITH CONTRAST TECHNIQUE: Multidetector CT imaging of the neck was performed using the standard protocol following the bolus administration of intravenous contrast. CONTRAST:  100 mL Isovue 370 COMPARISON:  Head CT without contrast 03/05/2016. FINDINGS: Pharynx and larynx: Negative larynx. Moderate severe left parapharyngeal space inflammation and soft tissue swelling. Generalized left lateral pharyngeal wall soft tissue swelling/edema. Associated retropharyngeal effusion. The inflammation extends from the superior left parapharyngeal space and nasopharynx through to the left  piriform sinus. The right lateral pharynx and right parapharyngeal space are normal. Salivary glands: Negative sublingual space, but moderate to severe inflammation of the left parotid and submandibular gland with confluent inflammation tracking from the left parotid space through to the left submandibular space, in association with the widespread left parapharyngeal space edema. Superimposed left lateral face and neck subcutaneous edema and skin thickening. Widespread edema/free fluid tracking in the left neck, including wrapping around the posterior left neck to the midline. There is edema within the left sternocleidomastoid muscle. No organized or drainable fluid collection. The inflamed left parotid and submandibular glands are hyper enhancing. There is no enlargement of the left submandibular duct. The left parotid duct is mostly obscured by dental streak artifact. There is a punctate sialolith along the anterior left submandibular gland on series 2, image 52. No other sialolithiasis identified. The right submandibular and right parotid glands are normal. Thyroid: Negative thyroid. Lymph nodes:  No right cervical lymphadenopathy. Left side cervical lymph nodes from the level 1 to level 3 stations are hyper enhancing but remain small, 5-6 mm short axis and smaller. No enlarged nodes. No cystic or necrotic nodes. Vascular: The right IJ is dominant and patent. Non dominant and primarily  drains the left face. The left EJ is patent. Bilateral major vascular structures in the neck and at the skullbase are patent. Limited intracranial: Negative. Visualized orbits: Negative, postoperative changes to both globes. Mastoids and visualized paranasal sinuses: Visualized paranasal sinuses and mastoids are stable and well pneumatized. Skeleton: The mandible is intact. The left mandible appears normal. No acute dental changes identified. Degenerative changes in the cervical spine. No acute osseous abnormality identified. Upper  chest: Normal visualized superior mediastinum. Negative lung apices aside from mild right upper lobe bronchiectasis. No axillary lymphadenopathy. Other: None. IMPRESSION: 1. Severe trans-spatial inflammation affecting the left face and neck. The source appears to be acute inflammation of both the left parotid and submandibular glands. Favor acute infectious sialoadenitis. 2. Inflammation and edema tracking throughout the superficial and deep left neck including the left parapharyngeal, retropharyngeal spaces and diffusely involving the left lateral pharyngeal mucosal space. 3. No airway compromise at this time. No drainable fluid collection or abscess. No associated vascular thrombosis. 4. Small reactive lymph nodes throughout the left level 1 to level 3 nodal stations. 5. No involvement of the upper chest. Electronically Signed   By: Genevie Ann M.D.   On: 03/15/2016 09:18   Dg C-arm 1-60 Min  Result Date: 03/05/2016 CLINICAL DATA:  Left IM nail. EXAM: DG C-ARM 61-120 MIN; LEFT FEMUR 2 VIEWS COMPARISON:  Plain film of the left femur from earlier same day. FINDINGS: Four intraoperative fluoroscopic spot images show intra medullary nail fixation of the left femur fracture. Hardware appears intact and appropriately positioned. No evidence of surgical complicating feature. Fluoroscopy provided for 1 minutes 41 seconds. IMPRESSION: Intraoperative images showing intramedullary fixation of the left femur fracture. No evidence of surgical complicating feature. Electronically Signed   By: Franki Cabot M.D.   On: 03/05/2016 20:44   Dg Femur Min 2 Views Left  Result Date: 03/17/2016 CLINICAL DATA:  Left femur pain. EXAM: LEFT FEMUR 2 VIEWS COMPARISON:  Radiographs of March 05, 2016. FINDINGS: Status post internal fixation of proximal left femoral neck fracture and intra medullary rod fixation of left femoral shaft. Good alignment of the fracture components is noted. Expected postoperative changes are seen in the  surrounding soft tissues. IMPRESSION: Status post surgical internal fixation of proximal left femoral neck fracture with intra medullary rod fixation of left femoral shaft. No new fracture or dislocation is noted. Electronically Signed   By: Marijo Conception, M.D.   On: 03/17/2016 16:27   Dg Femur Min 2 Views Left  Result Date: 03/05/2016 CLINICAL DATA:  Left IM nail. EXAM: DG C-ARM 61-120 MIN; LEFT FEMUR 2 VIEWS COMPARISON:  Plain film of the left femur from earlier same day. FINDINGS: Four intraoperative fluoroscopic spot images show intra medullary nail fixation of the left femur fracture. Hardware appears intact and appropriately positioned. No evidence of surgical complicating feature. Fluoroscopy provided for 1 minutes 41 seconds. IMPRESSION: Intraoperative images showing intramedullary fixation of the left femur fracture. No evidence of surgical complicating feature. Electronically Signed   By: Franki Cabot M.D.   On: 03/05/2016 20:44   Dg Femur Min 2 Views Left  Result Date: 03/05/2016 CLINICAL DATA:  Pain after fall EXAM: LEFT FEMUR 2 VIEWS COMPARISON:  None. FINDINGS: There is an intertrochanteric fracture through the proximal left femur. No dislocation. No other fractures identified. IMPRESSION: Proximal left intertrochanteric femoral fracture. Electronically Signed   By: Dorise Bullion III M.D   On: 03/05/2016 12:56    (Echo, Carotid, EGD, Colonoscopy, ERCP)  Subjective:   Discharge Exam: Vitals:   03/20/16 2109 03/21/16 0420  BP: (!) 128/47 (!) 132/54  Pulse: 90 90  Resp: 18 18  Temp: 99.7 F (37.6 C) 98.3 F (36.8 C)   Vitals:   03/20/16 0510 03/20/16 1658 03/20/16 2109 03/21/16 0420  BP: (!) 154/56 (!) 142/63 (!) 128/47 (!) 132/54  Pulse: 92 88 90 90  Resp: 19 (!) 21 18 18   Temp: 97.6 F (36.4 C) 99.3 F (37.4 C) 99.7 F (37.6 C) 98.3 F (36.8 C)  TempSrc: Oral Oral Axillary Oral  SpO2: 94% 96% 94% 94%  Weight:      Height:        Gen: [place] thin built  female not in distress, irritable HEENT: Left Parotid swelling with erythema, improving progessively. able to open her mouth better Chest: clear b/l, no added sounds CVS: N S1&S2, no murmurs, GI: soft, NT, ND, BS+ ileostomy status Musculoskeletal: warm, no edema, CNS: Alert and oriented, nonfocal    The results of significant diagnostics from this hospitalization (including imaging, microbiology, ancillary and laboratory) are listed below for reference.     Microbiology: Recent Results (from the past 240 hour(s))  Culture, blood (Routine x 2)     Status: None   Collection Time: 03/15/16  7:20 AM  Result Value Ref Range Status   Specimen Description BLOOD RIGHT WRIST  Final   Special Requests BOTTLES DRAWN AEROBIC AND ANAEROBIC 5CC  Final   Culture   Final    NO GROWTH 5 DAYS Performed at [facility]    Report Status 03/20/2016 FINAL  Final  Culture, blood (Routine x 2)     Status: None   Collection Time: 03/15/16  7:20 AM  Result Value Ref Range Status   Specimen Description BLOOD LEFT FOREARM  Final   Special Requests IN PEDIATRIC BOTTLE [place]  Final   Culture   Final    NO GROWTH 5 DAYS Performed at [facility]    Report Status 03/20/2016 FINAL  Final  MRSA PCR Screening     Status: None   Collection Time: 03/15/16  3:20 PM  Result Value Ref Range Status   MRSA by PCR NEGATIVE NEGATIVE Final    Comment:        The GeneXpert MRSA Assay (FDA approved for NASAL specimens only), is one component of a comprehensive MRSA colonization surveillance program. It is not intended to diagnose MRSA infection nor to guide or monitor treatment for MRSA infections.      Labs: BNP (last 3 results) No results for input(s): BNP in the last 8760 hours. Basic Metabolic Panel:  Recent Labs Lab 03/16/16 0210 03/17/16 2056 03/20/16 0521 03/20/16 0800 03/20/16 1050 03/21/16 0520  NA 139 140 139  --  138 137  K 3.7 3.2* 2.3*  --  2.8* 3.2*  CL 105 106 102  --   102 103  CO2 22 26 27   --  25 26  GLUCOSE 109* 116* 126*  --  103* 128*  BUN 18 13 5*  --  5* 6  CREATININE 0.80 0.76 0.97  --  1.04* 1.16*  CALCIUM 8.5* 8.9 8.0*  --  7.8* 7.9*  MG  --   --   --  1.1*  --  2.2   Liver Function Tests:  Recent Labs Lab 03/15/16 0720  AST 20  ALT 21  ALKPHOS 78  BILITOT 1.3*  PROT 7.0  ALBUMIN 3.0*   No results for input(s): LIPASE,  AMYLASE in the last 168 hours. No results for input(s): AMMONIA in the last 168 hours. CBC:  Recent Labs Lab 03/15/16 0720 03/16/16 0210 03/17/16 2056 03/20/16 0521  WBC 17.2* 18.8* 16.2* 13.6*  NEUTROABS 16.0*  --   --   --   HGB 12.9 10.4* 9.1* 9.9*  HCT 37.8 32.7* 29.0* 30.6*  MCV 89.8 93.7 94.5 91.1  PLT 492* 432* 467* 416*   Cardiac Enzymes: No results for input(s): CKTOTAL, CKMB, CKMBINDEX, TROPONINI in the last 168 hours. BNP: Invalid input(s): POCBNP CBG:  Recent Labs Lab 03/15/16 1535  GLUCAP 151*   D-Dimer No results for input(s): DDIMER in the last 72 hours. Hgb A1c No results for input(s): HGBA1C in the last 72 hours. Lipid Profile No results for input(s): CHOL, HDL, LDLCALC, TRIG, CHOLHDL, LDLDIRECT in the last 72 hours. Thyroid function studies No results for input(s): TSH, T4TOTAL, T3FREE, THYROIDAB in the last 72 hours.  Invalid input(s): FREET3 Anemia work up No results for input(s): VITAMINB12, FOLATE, FERRITIN, TIBC, IRON, RETICCTPCT in the last 72 hours. Urinalysis    Component Value Date/Time   COLORURINE YELLOW 03/11/2016 0736   APPEARANCEUR CLEAR 03/11/2016 0736   LABSPEC 1.025 03/11/2016 0736   PHURINE 5.0 03/11/2016 0736   GLUCOSEU NEGATIVE 03/11/2016 0736   HGBUR NEGATIVE 03/11/2016 0736   BILIRUBINUR SMALL (A) 03/11/2016 0736   BILIRUBINUR neg 08/22/2011 1010   KETONESUR NEGATIVE 03/11/2016 0736   PROTEINUR NEGATIVE 03/11/2016 0736   UROBILINOGEN negative 08/22/2011 1010   UROBILINOGEN 0.2 11/19/2007 0157   NITRITE NEGATIVE 03/11/2016 0736   LEUKOCYTESUR  SMALL (A) 03/11/2016 0736   Sepsis Labs Invalid input(s): PROCALCITONIN,  WBC,  LACTICIDVEN Microbiology Recent Results (from the past 240 hour(s))  Culture, blood (Routine x 2)     Status: None   Collection Time: 03/15/16  7:20 AM  Result Value Ref Range Status   Specimen Description BLOOD RIGHT WRIST  Final   Special Requests BOTTLES DRAWN AEROBIC AND ANAEROBIC 5CC  Final   Culture   Final    NO GROWTH 5 DAYS Performed at [facility]    Report Status 03/20/2016 FINAL  Final  Culture, blood (Routine x 2)     Status: None   Collection Time: 03/15/16  7:20 AM  Result Value Ref Range Status   Specimen Description BLOOD LEFT FOREARM  Final   Special Requests IN PEDIATRIC BOTTLE [place]  Final   Culture   Final    NO GROWTH 5 DAYS Performed at [facility]    Report Status 03/20/2016 FINAL  Final  MRSA PCR Screening     Status: None   Collection Time: 03/15/16  3:20 PM  Result Value Ref Range Status   MRSA by PCR NEGATIVE NEGATIVE Final    Comment:        The GeneXpert MRSA Assay (FDA approved for NASAL specimens only), is one component of a comprehensive MRSA colonization surveillance program. It is not intended to diagnose MRSA infection nor to guide or monitor treatment for MRSA infections.      Time coordinating discharge: Over 30 minutes  SIGNED:   Louellen Molder, MD  [facility] 03/21/2016, 1:47 PM Pager   If 7PM-7AM, please contact night-coverage [email or web address] Password [email or web address]

## 2016-03-21 NOTE — Care Management Important Message (Signed)
Important Message  Patient Details  Name: Kim [name] MRN: [id number] Date of Birth: September 13, 1940   Medicare Important Message Given:  Yes    Jaretssi Kraker [place] 03/21/2016, 2:00 PM

## 2016-03-21 NOTE — Progress Notes (Signed)
Pharmacy Antibiotic Note  Kim [name] is a 75 y.o. female admitted on 03/15/2016 with parotitis.  Pharmacy has been consulted for Vancomycin Day #7.  SCr trending upwards.    Plan: Change Vancomycin 750  mg IV q24h Will recheck BMet tomorrow.    Height: 4' 9.6" (146.3 cm) Weight: 83 lb 4.8 oz (37.8 kg) IBW/kg (Calculated) : 39.98  Temp (24hrs), Avg:99.1 F (37.3 C), Min:98.3 F (36.8 C), Max:99.7 F (37.6 C)   Recent Labs Lab 03/15/16 0720 03/15/16 0739 03/15/16 1028 03/15/16 1222 03/16/16 0210 03/17/16 2056 03/20/16 0521 03/20/16 1050 03/21/16 0520  WBC 17.2*  --   --   --  18.8* 16.2* 13.6*  --   --   CREATININE 0.93  --   --   --  0.80 0.76 0.97 1.04* 1.16*  LATICACIDVEN  --  1.29 1.96* 1.05  --   --   --   --   --   VANCOTROUGH  --   --   --   --   --   --   --   --  22*    Estimated Creatinine Clearance: 25.4 mL/min (by C-G formula based on SCr of 1.16 mg/dL (H)).    Allergies  Allergen Reactions  . Antihistamines, Chlorpheniramine-Type     Dry  Mouth, dry eyes-so dry she cannot even talk  . Metronidazole     Peripheral neuropathy  . Other Diarrhea and Nausea And Vomiting    Patient has an Ileostomy  . Sulfa Antibiotics Other (See Comments)    UNKNOWN  . Tape Other (See Comments)    SKIN WILL TEAR WITH ANYTHING OTHER THAN PAPER TAPE OR COBAN WRAP    Antimicrobials this admission: Zosyn 10/10 >>  Vanc 10/10 >>    Microbiology results: 10/10 BCx: ngtd 10/10 MRSA pcr: negative  Phillis Knack, PharmD, BCPS

## 2016-03-21 NOTE — Progress Notes (Signed)
PT Cancellation Note  Patient Details Name: Kim [name] MRN: [id number] DOB: 1940-09-05   Cancelled Treatment:    Reason Eval/Treat Not Completed: Other (comment) (leaving hospital via ambulance transport on pm arrival.).  Unable to get to patient at a time when pt was "adequately" medicated. 03/21/2016  Donnella Sham, [place] [phone]  (pager)   [name], [name] 03/21/2016, 5:26 PM

## 2016-03-21 NOTE — Progress Notes (Signed)
CRITICAL VALUE ALERT  Critical value received:  Vanc trough 22  Date of notification:  03/21/16  Time of notification:  0624  Critical value read back:Yes.    Nurse who received alert:  Will Bonnet RN  MD notified (1st page):  Schorr, NP   Time of first page:  [phone]  Page not returned.  Pharmacy notified. Vanc not given due to elevated trough level. Vanc dosage and time adjusted per pharmacy.

## 2016-03-21 NOTE — Clinical Social Work Placement (Signed)
   CLINICAL SOCIAL WORK PLACEMENT  NOTE  Date:  03/21/2016  Patient Details  Name: Kim [name] MRN: [id number] Date of Birth: May 14, 1941  Clinical Social Work is seeking post-discharge placement for this patient at the [place] level of care (*CSW will initial, date and re-position this form in  chart as items are completed):  Yes   Patient/family provided with [place] Work Department's list of facilities offering this level of care within the geographic area requested by the patient (or if unable, by the patient's family).  Yes   Patient/family informed of their freedom to choose among providers that offer the needed level of care, that participate in Medicare, Medicaid or managed care program needed by the patient, have an available bed and are willing to accept the patient.      Patient/family informed of [facility]'s ownership interest in [facility] and [facility], as well as of the fact that they are under no obligation to receive care at these facilities.  PASRR submitted to EDS on       PASRR number received on       Existing PASRR number confirmed on 03/18/16     FL2 transmitted to all facilities in geographic area requested by pt/family on 03/18/16     FL2 transmitted to all facilities within larger geographic area on 03/18/16     Patient informed that his/her managed care company has contracts with or will negotiate with certain facilities, including the following:        Yes   Patient/family informed of bed offers received.  Patient chooses bed at [facility]     Physician recommends and patient chooses bed at      Patient to be transferred to [facility] on 03/21/16.  Patient to be transferred to facility by [facility]     Patient family notified on   of transfer.  Name of family member notified:        PHYSICIAN   Additional Comment:  Clinical Social Worker facilitated patient discharge including contacting patient  family and facility to confirm patient discharge plans.  Clinical information faxed to facility and family agreeable with plan.  CSW arranged ambulance transport via [place] to [facility] .  RN to call report prior to discharge.  Clinical Social Worker will sign off for now as social work intervention is no longer needed. Please consult [place] again if new need arises.    _______________________________________________ Sela Hilding, [place]

## 2016-03-21 NOTE — Clinical Social Work Note (Signed)
Clinical Social Worker facilitated patient discharge including contacting patient family and facility to confirm patient discharge plans.  Clinical information faxed to facility and family agreeable with plan.  CSW arranged ambulance transport via [place] to [facility].  RN to call report prior to discharge.  Patients husband requesting that nurse notify him when transportation takes place.  Clinical Social Worker will sign off for now as social work intervention is no longer needed. Please consult [place] again if new need arises.  [address], [place]

## 2016-03-22 ENCOUNTER — Other Ambulatory Visit: Payer: Self-pay | Admitting: Geriatric Medicine

## 2016-03-22 DIAGNOSIS — R911 Solitary pulmonary nodule: Secondary | ICD-10-CM

## 2016-05-02 ENCOUNTER — Other Ambulatory Visit: Payer: Self-pay | Admitting: Geriatric Medicine

## 2016-05-02 DIAGNOSIS — R911 Solitary pulmonary nodule: Secondary | ICD-10-CM

## 2016-06-09 ENCOUNTER — Ambulatory Visit
Admission: RE | Admit: 2016-06-09 | Discharge: 2016-06-09 | Disposition: A | Discharge status: Home / Self Care | Payer: Medicare Other | Source: Ambulatory Visit | Attending: Geriatric Medicine | Admitting: Geriatric Medicine

## 2016-06-09 DIAGNOSIS — R911 Solitary pulmonary nodule: Secondary | ICD-10-CM

## 2016-06-16 ENCOUNTER — Other Ambulatory Visit: Payer: Self-pay | Admitting: Geriatric Medicine

## 2016-06-16 DIAGNOSIS — Z1231 Encounter for screening mammogram for malignant neoplasm of breast: Secondary | ICD-10-CM

## 2016-07-26 ENCOUNTER — Ambulatory Visit
Admission: RE | Admit: 2016-07-26 | Discharge: 2016-07-26 | Disposition: A | Discharge status: Home / Self Care | Payer: Medicare Other | Source: Ambulatory Visit | Attending: Geriatric Medicine | Admitting: Geriatric Medicine

## 2016-07-26 DIAGNOSIS — Z1231 Encounter for screening mammogram for malignant neoplasm of breast: Secondary | ICD-10-CM

## 2016-11-07 ENCOUNTER — Telehealth: Payer: Self-pay

## 2016-11-07 NOTE — Telephone Encounter (Signed)
Pt was last seen at [facility] 12/26/15 for blood tranfusion and last saw Dr Julien Nordmann 09/24/15. She has chronic anemia that she was seeing Dr Julien Nordmann for. And she sees Dr Sheryn Bison for Crohn's. She had an ileostomy done at [facility] in August so cancelled appts for a time.  She is now feeling weak and low energy and wants to reestablish care with Dr Julien Nordmann.   She would like June 14 about 11 am if possible for labs. She chose this date b/c she has "a slew of doctors appts"

## 2016-11-08 ENCOUNTER — Other Ambulatory Visit: Payer: Self-pay | Admitting: Medical Oncology

## 2016-11-08 ENCOUNTER — Telehealth: Payer: Self-pay | Admitting: Medical Oncology

## 2016-11-08 ENCOUNTER — Telehealth: Payer: Self-pay | Admitting: Internal Medicine

## 2016-11-08 DIAGNOSIS — D508 Other iron deficiency anemias: Secondary | ICD-10-CM

## 2016-11-08 DIAGNOSIS — D638 Anemia in other chronic diseases classified elsewhere: Secondary | ICD-10-CM

## 2016-11-08 NOTE — Telephone Encounter (Signed)
In the past year she has a ileostomy and then broke her femur. She is reqeusting a lab appt to check her hgb and iron studies. She  will call Drs. Stoneking and Onken at [facility] to see if they want labs that day sop she can just get one venipuncture. I gave pt my fax number so they can fax order.

## 2016-11-08 NOTE — Telephone Encounter (Signed)
Scheduled appt per sch message - patient aware of appt date and time.

## 2016-11-09 ENCOUNTER — Other Ambulatory Visit: Payer: Self-pay | Admitting: Medical Oncology

## 2016-11-16 ENCOUNTER — Other Ambulatory Visit: Payer: Self-pay | Admitting: Emergency Medicine

## 2016-11-16 DIAGNOSIS — K50813 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: Secondary | ICD-10-CM

## 2016-11-16 DIAGNOSIS — D508 Other iron deficiency anemias: Secondary | ICD-10-CM

## 2016-11-16 DIAGNOSIS — E559 Vitamin D deficiency, unspecified: Secondary | ICD-10-CM

## 2016-11-17 ENCOUNTER — Other Ambulatory Visit: Payer: Self-pay | Admitting: Emergency Medicine

## 2016-11-17 ENCOUNTER — Other Ambulatory Visit (HOSPITAL_BASED_OUTPATIENT_CLINIC_OR_DEPARTMENT_OTHER): Payer: Medicare Other

## 2016-11-17 DIAGNOSIS — D638 Anemia in other chronic diseases classified elsewhere: Secondary | ICD-10-CM

## 2016-11-17 DIAGNOSIS — D509 Iron deficiency anemia, unspecified: Secondary | ICD-10-CM

## 2016-11-17 DIAGNOSIS — D508 Other iron deficiency anemias: Secondary | ICD-10-CM

## 2016-11-17 LAB — COMPREHENSIVE METABOLIC PANEL
ALT: 19 U/L (ref 0–55)
AST: 21 U/L (ref 5–34)
Albumin: 3.5 g/dL (ref 3.5–5.0)
Alkaline Phosphatase: 74 U/L (ref 40–150)
Anion Gap: 9 mEq/L (ref 3–11)
BUN: 29.7 mg/dL — AB (ref 7.0–26.0)
CALCIUM: 10 mg/dL (ref 8.4–10.4)
CO2: 22 mEq/L (ref 22–29)
Chloride: 109 mEq/L (ref 98–109)
Creatinine: 1.4 mg/dL — ABNORMAL HIGH (ref 0.6–1.1)
EGFR: 36 mL/min/{1.73_m2} — ABNORMAL LOW (ref >90)
Glucose: 81 mg/dl (ref 70–140)
Potassium: 3.9 mEq/L (ref 3.5–5.1)
Sodium: 140 mEq/L (ref 136–145)
Total Bilirubin: 0.36 mg/dL (ref 0.20–1.20)
Total Protein: 7.4 g/dL (ref 6.4–8.3)

## 2016-11-17 LAB — IRON AND TIBC
%SAT: 42 % (ref 21–57)
Iron: 110 ug/dL (ref 41–142)
TIBC: 260 ug/dL (ref 236–444)
UIBC: 150 ug/dL (ref 120–384)

## 2016-11-17 LAB — CBC & DIFF AND RETIC
BASO%: 0.6 % (ref 0.0–2.0)
Basophils Absolute: 0 10*3/uL (ref 0.0–0.1)
EOS%: 2.8 % (ref 0.0–7.0)
Eosinophils Absolute: 0.2 10*3/uL (ref 0.0–0.5)
HEMATOCRIT: 34 % — AB (ref 34.8–46.6)
HGB: 11.1 g/dL — ABNORMAL LOW (ref 11.6–15.9)
Immature Retic Fract: 5.9 % (ref 1.60–10.00)
LYMPH%: 16.6 % (ref 14.0–49.7)
MCH: 32.2 pg (ref 25.1–34.0)
MCHC: 32.6 g/dL (ref 31.5–36.0)
MCV: 98.6 fL (ref 79.5–101.0)
MONO#: 0.4 10*3/uL (ref 0.1–0.9)
MONO%: 6.5 % (ref 0.0–14.0)
NEUT%: 73.5 % (ref 38.4–76.8)
NEUTROS ABS: 3.9 10*3/uL (ref 1.5–6.5)
Platelets: 132 10*3/uL — ABNORMAL LOW (ref 145–400)
RBC: 3.45 10*6/uL — ABNORMAL LOW (ref 3.70–5.45)
RDW: 13 % (ref 11.2–14.5)
RETIC %: 1.6 % (ref 0.70–2.10)
Retic Ct Abs: 55.2 10*3/uL (ref 33.70–90.70)
WBC: 5.4 10*3/uL (ref 3.9–10.3)
lymph#: 0.9 10*3/uL (ref 0.9–3.3)

## 2016-11-17 LAB — FERRITIN: Ferritin: 1597 ng/ml — ABNORMAL HIGH (ref 9–269)

## 2016-11-29 ENCOUNTER — Ambulatory Visit: Payer: Medicare Other | Admitting: Internal Medicine

## 2016-12-05 ENCOUNTER — Ambulatory Visit (HOSPITAL_BASED_OUTPATIENT_CLINIC_OR_DEPARTMENT_OTHER): Payer: Medicare Other | Admitting: Internal Medicine

## 2016-12-05 ENCOUNTER — Telehealth: Payer: Self-pay | Admitting: Internal Medicine

## 2016-12-05 ENCOUNTER — Encounter: Payer: Self-pay | Admitting: Internal Medicine

## 2016-12-05 VITALS — BP 128/47 | HR 58 | Temp 97.8°F | Resp 18 | Ht <= 58 in | Wt 100.5 lb

## 2016-12-05 DIAGNOSIS — K50813 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: Secondary | ICD-10-CM

## 2016-12-05 DIAGNOSIS — K909 Intestinal malabsorption, unspecified: Secondary | ICD-10-CM | POA: Diagnosis not present

## 2016-12-05 DIAGNOSIS — D508 Other iron deficiency anemias: Secondary | ICD-10-CM

## 2016-12-05 DIAGNOSIS — D638 Anemia in other chronic diseases classified elsewhere: Secondary | ICD-10-CM

## 2016-12-05 DIAGNOSIS — E43 Unspecified severe protein-calorie malnutrition: Secondary | ICD-10-CM

## 2016-12-05 NOTE — Progress Notes (Signed)
[facility] [phone]   [phone]  OFFICE PROGRESS NOTE  Lajean Manes, MD 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   PRIOR THERAPY:  1) PRBCs transfusion as needed last was given few days ago.   CURRENT THERAPY:  1) Intravenous Feraheme infusion on as-needed basis. Last dose was given on last infusion was on 09/18/2015 2) vitamin B12 injection on monthly basis.  INTERVAL HISTORY: Kim [name] 76 y.o. female returns to the clinic today for follow-up visit after 15 months. She lost her routine follow-up visit last year because she has multiple medical issues including left hip fracture as well as admission to the hospital with neck infection and change of her colostomy. She has been feeling fatigued and tired recently and the patient came back for reevaluation and repeat blood work. She is feeling much better today. She denied having any chest pain, shortness of breath, cough or hemoptysis. She denied having any fever or chills. She has no nausea, vomiting, diarrhea or constipation. She is here today for evaluation and repeat blood work.  MEDICAL HISTORY: Past Medical History:  Diagnosis Date  . Anemia    related to malabsorption--gets B12 shots and iron infusions, managed by Dr. Julien Nordmann  . BCC (basal cell carcinoma of skin)   . Crohn disease ([place])    [facility]. Emelda Fear at [facility]  . Glaucoma    Dr. Janyth Contes  . Intestinal malabsorption   . OA (osteoarthritis)    hands,back  . Osteoporosis    managed by Dr. Nevada Crane at [facility], [place] q2 yrs    ALLERGIES:  is allergic to antihistamines, chlorpheniramine-type; metronidazole; other; sulfa antibiotics; and tape.  MEDICATIONS:  Current Outpatient Prescriptions  Medication Sig Dispense Refill  . acetaminophen (TYLENOL) 500 MG tablet Take 500 mg by mouth 2 (two) times daily.       . cholecalciferol (VITAMIN D) 1000 UNITS tablet Take 2,000 Units by mouth daily.     . cyanocobalamin (,VITAMIN B-12,) 1000 MCG/ML injection INJECT 1 MILLILITER INTO MUSCLE EVERY 30 DAYS 3 mL 2  . dorzolamide-timolol (COSOPT) 22.3-6.8 MG/ML ophthalmic solution Place 1 drop into both eyes every 12 (twelve) hours.     . gabapentin (NEURONTIN) 300 MG capsule Take 300 mg by mouth 3 (three) times daily.     Marland Kitchen latanoprost (XALATAN) 0.005 % ophthalmic solution Place 1 drop into both eyes at bedtime.     . Melatonin 10 MG TBCR Take 10 mg by mouth at bedtime as needed for sleep.    . Multiple Vitamins-Minerals (ONE-A-DAY WOMENS 50+ ADVANTAGE) TABS Take 1 tablet by mouth daily.    Marland Kitchen oxyCODONE-acetaminophen (PERCOCET) 7.5-325 MG tablet Take 1 tablet by mouth every 4 (four) hours as needed for moderate pain. 24 tablet 0   No current facility-administered medications for this visit.    Facility-Administered Medications Ordered in Other Visits  Medication Dose Route Frequency Provider Last Rate Last Dose  . acetaminophen (TYLENOL) tablet 650 mg  650 mg Oral Once Carlton Adam, PA-C        SURGICAL HISTORY:  Past Surgical History:  Procedure Laterality Date  . BREAST EXCISIONAL BIOPSY Right   . Crohn  2009  . internal ostomy pouch  1994  . INTRAMEDULLARY (IM) NAIL INTERTROCHANTERIC Left 03/05/2016   Procedure: INTRAMEDULLARY (IM) NAIL INTERTROCHANTRIC;  Surgeon: Nicholes Stairs, MD;  Location: [facility];  Service:  Orthopedics;  Laterality: Left;  . TOTAL COLECTOMY  1984   due to Crohn's    REVIEW OF SYSTEMS:  A comprehensive review of systems was negative except for: Constitutional: positive for fatigue   PHYSICAL EXAMINATION: General appearance: alert, cooperative, fatigued and no distress Head: Normocephalic, without obvious abnormality, atraumatic Neck: no adenopathy, no JVD, supple, symmetrical, trachea midline and thyroid not enlarged, symmetric, no tenderness/mass/nodules Lymph nodes:  Cervical, supraclavicular, and axillary nodes normal. Resp: clear to auscultation bilaterally Back: symmetric, no curvature. ROM normal. No CVA tenderness. Cardio: regular rate and rhythm, S1, S2 normal, no murmur, click, rub or gallop GI: soft, non-tender; bowel sounds normal; no masses,  no organomegaly Extremities: extremities normal, atraumatic, no cyanosis or edema  ECOG PERFORMANCE STATUS: 1 - Symptomatic but completely ambulatory  Blood pressure (!) 128/47, pulse (!) 58, temperature 97.8 F (36.6 C), temperature source Oral, resp. rate 18, height 4' 9"  (1.448 m), weight 100 lb 8 oz (45.6 kg), SpO2 100 %.  LABORATORY DATA: Lab Results  Component Value Date   WBC 5.4 11/17/2016   HGB 11.1 (L) 11/17/2016   HCT 34.0 (L) 11/17/2016   MCV 98.6 11/17/2016   PLT 132 (L) 11/17/2016      Chemistry      Component Value Date/Time   NA 140 11/17/2016 1134   K 3.9 11/17/2016 1134   CL 103 03/21/2016 0520   CO2 22 11/17/2016 1134   BUN 29.7 (H) 11/17/2016 1134   CREATININE 1.4 (H) 11/17/2016 1134      Component Value Date/Time   CALCIUM 10.0 11/17/2016 1134   ALKPHOS 74 11/17/2016 1134   AST 21 11/17/2016 1134   ALT 19 11/17/2016 1134   BILITOT 0.36 11/17/2016 1134     Other lab results: Ferritin 1597, serum iron 110, total iron binding capacity 260 and iron saturation 42 %.   RADIOGRAPHIC STUDIES: No results found.   ASSESSMENT AND PLAN:  This is a very pleasant 76 years old white female with history of iron deficiency anemia/anemia of chronic disease secondary to malabsorption and Crohn's disease. The patient require Feraheme infusion at intermittent basis because of her blood loss from the colostomy as well as the malabsorption from Crohn's disease. Her recent CBC showed hemoglobin of 11.1 and her iron study and ferritin are unremarkable for any deficiency. I recommended for the patient to continue on observation with repeat CBC, iron study and ferritin in 6 months. She  knows to call immediately if she has any concerning issues in the interval. The patient will continue her treatment with intravenous Vedolizumab under the care of Dr. Emelda Fear at [facility]. The patient voices understanding of current disease status and treatment options and is in agreement with the current care plan. All questions were answered. The patient knows to call the clinic with any problems, questions or concerns. We can certainly see the patient much sooner if necessary.  Disclaimer: This note was dictated with voice recognition software. Similar sounding words can inadvertently be transcribed and may be missed upon review.

## 2016-12-05 NOTE — Telephone Encounter (Signed)
Gave patient AVS and calender per los - lab and f/u in 6  Months.

## 2017-01-04 HISTORY — PX: REDUCTION MAMMAPLASTY: SUR839

## 2017-02-01 ENCOUNTER — Other Ambulatory Visit: Payer: Self-pay | Admitting: Plastic Surgery

## 2017-06-08 ENCOUNTER — Other Ambulatory Visit (HOSPITAL_BASED_OUTPATIENT_CLINIC_OR_DEPARTMENT_OTHER): Payer: Medicare Other

## 2017-06-08 DIAGNOSIS — D509 Iron deficiency anemia, unspecified: Secondary | ICD-10-CM | POA: Diagnosis not present

## 2017-06-08 DIAGNOSIS — D638 Anemia in other chronic diseases classified elsewhere: Secondary | ICD-10-CM

## 2017-06-08 DIAGNOSIS — E43 Unspecified severe protein-calorie malnutrition: Secondary | ICD-10-CM

## 2017-06-08 DIAGNOSIS — K50813 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: Secondary | ICD-10-CM

## 2017-06-08 DIAGNOSIS — D508 Other iron deficiency anemias: Secondary | ICD-10-CM

## 2017-06-08 LAB — FERRITIN: Ferritin: 1094 ng/ml — ABNORMAL HIGH (ref 9–269)

## 2017-06-08 LAB — CBC WITH DIFFERENTIAL/PLATELET
BASO%: 0.4 % (ref 0.0–2.0)
Basophils Absolute: 0 10*3/uL (ref 0.0–0.1)
EOS%: 3.3 % (ref 0.0–7.0)
Eosinophils Absolute: 0.2 10*3/uL (ref 0.0–0.5)
HEMATOCRIT: 34.1 % — AB (ref 34.8–46.6)
HEMOGLOBIN: 11 g/dL — AB (ref 11.6–15.9)
LYMPH#: 0.8 10*3/uL — AB (ref 0.9–3.3)
LYMPH%: 18 % (ref 14.0–49.7)
MCH: 32.4 pg (ref 25.1–34.0)
MCHC: 32.3 g/dL (ref 31.5–36.0)
MCV: 100.6 fL (ref 79.5–101.0)
MONO#: 0.4 10*3/uL (ref 0.1–0.9)
MONO%: 8 % (ref 0.0–14.0)
NEUT#: 3.2 10*3/uL (ref 1.5–6.5)
NEUT%: 70.3 % (ref 38.4–76.8)
Platelets: 137 10*3/uL — ABNORMAL LOW (ref 145–400)
RBC: 3.39 10*6/uL — ABNORMAL LOW (ref 3.70–5.45)
RDW: 14.9 % — AB (ref 11.2–14.5)
WBC: 4.5 10*3/uL (ref 3.9–10.3)

## 2017-06-08 LAB — IRON AND TIBC
%SAT: 38 % (ref 21–57)
IRON: 98 ug/dL (ref 41–142)
TIBC: 258 ug/dL (ref 236–444)
UIBC: 160 ug/dL (ref 120–384)

## 2017-06-15 ENCOUNTER — Ambulatory Visit: Payer: Medicare Other | Admitting: Internal Medicine

## 2017-06-19 ENCOUNTER — Other Ambulatory Visit: Payer: Self-pay | Admitting: Geriatric Medicine

## 2017-06-19 DIAGNOSIS — R911 Solitary pulmonary nodule: Secondary | ICD-10-CM

## 2017-06-20 ENCOUNTER — Telehealth: Payer: Self-pay | Admitting: Internal Medicine

## 2017-06-20 ENCOUNTER — Telehealth: Payer: Self-pay | Admitting: Medical Oncology

## 2017-06-20 NOTE — Telephone Encounter (Signed)
appt given for Feb 13th at 1130. She is asking for -c-reactive protein results.

## 2017-06-20 NOTE — Telephone Encounter (Signed)
Patient called in to reschedule she was upset because no one had called her back to reschedule her appointment with Dr. Julien Nordmann

## 2017-06-20 NOTE — Telephone Encounter (Signed)
Returned call to pt. "can she call you back?" I told husband to have her call me back tomorrow to r/s appt

## 2017-06-23 ENCOUNTER — Other Ambulatory Visit: Payer: Medicare Other

## 2017-06-26 ENCOUNTER — Telehealth: Payer: Self-pay | Admitting: Medical Oncology

## 2017-06-26 NOTE — Telephone Encounter (Signed)
I told pt that her c reactive protein was drawn on jan 3 and sent to [facility] with Dr Emelda Fear fax number.

## 2017-06-27 ENCOUNTER — Telehealth: Payer: Self-pay | Admitting: *Deleted

## 2017-06-27 NOTE — Telephone Encounter (Signed)
Pt called with concerns regarding her recent lab results. Reviewed with MD and discussed with pt he will discuss all results at office visit. Pt advised she was sick an unable to come and has rescheduled for 2/13. Pt also questioned a C reactive protein test. After reviewing chart advised pt I did not see results from this test. Pt states " I got the Rx from [facility] [address] [address] and brought it in the lab, handed it to the tech and they drew it" My Dr at [facility] has not called me.  Called [facility] lab who advised the resulting agency will contact the Dr who ordered the test. Returned call to pt and discussed above information. Pt was very upset that I was unable to give any additional information.  Discussed with pt to reach out to her MD at [facility] and inquire if they have any test results. Pt voiced she had no further concerns.

## 2017-06-28 ENCOUNTER — Telehealth: Payer: Self-pay | Admitting: Medical Oncology

## 2017-06-28 NOTE — Telephone Encounter (Signed)
C reactive protein drawn on 06/08/17 at [facility] and blood sent to [facility] with Dr Laurence Ferrari fax number ( confirmed by our lab). Pt called [facility] several days ago  and was upset because she did not know if the test was or was not drawn. She has not heard back from Dr Emelda Fear with results .  Contacted Dr Laurence Ferrari staff assistant  Ander Purpura,  and told her of pt conversation and requested they contact pt.

## 2017-07-14 ENCOUNTER — Ambulatory Visit
Admission: RE | Admit: 2017-07-14 | Discharge: 2017-07-14 | Disposition: A | Discharge status: Home / Self Care | Payer: Medicare Other | Source: Ambulatory Visit | Attending: Geriatric Medicine | Admitting: Geriatric Medicine

## 2017-07-14 DIAGNOSIS — R911 Solitary pulmonary nodule: Secondary | ICD-10-CM

## 2017-07-19 ENCOUNTER — Inpatient Hospital Stay: Payer: Medicare Other | Attending: Internal Medicine | Admitting: Oncology

## 2017-07-19 ENCOUNTER — Encounter: Payer: Self-pay | Admitting: Oncology

## 2017-07-19 ENCOUNTER — Telehealth: Payer: Self-pay

## 2017-07-19 VITALS — BP 102/66 | HR 65 | Temp 97.3°F | Resp 18

## 2017-07-19 DIAGNOSIS — N200 Calculus of kidney: Secondary | ICD-10-CM

## 2017-07-19 DIAGNOSIS — N281 Cyst of kidney, acquired: Secondary | ICD-10-CM | POA: Diagnosis not present

## 2017-07-19 DIAGNOSIS — I700 Atherosclerosis of aorta: Secondary | ICD-10-CM | POA: Diagnosis not present

## 2017-07-19 DIAGNOSIS — Z85828 Personal history of other malignant neoplasm of skin: Secondary | ICD-10-CM | POA: Insufficient documentation

## 2017-07-19 DIAGNOSIS — M810 Age-related osteoporosis without current pathological fracture: Secondary | ICD-10-CM

## 2017-07-19 DIAGNOSIS — M1990 Unspecified osteoarthritis, unspecified site: Secondary | ICD-10-CM | POA: Diagnosis not present

## 2017-07-19 DIAGNOSIS — Z79899 Other long term (current) drug therapy: Secondary | ICD-10-CM | POA: Diagnosis not present

## 2017-07-19 DIAGNOSIS — K909 Intestinal malabsorption, unspecified: Secondary | ICD-10-CM | POA: Diagnosis not present

## 2017-07-19 DIAGNOSIS — Z933 Colostomy status: Secondary | ICD-10-CM | POA: Diagnosis not present

## 2017-07-19 DIAGNOSIS — J479 Bronchiectasis, uncomplicated: Secondary | ICD-10-CM

## 2017-07-19 DIAGNOSIS — K5090 Crohn's disease, unspecified, without complications: Secondary | ICD-10-CM | POA: Insufficient documentation

## 2017-07-19 DIAGNOSIS — D508 Other iron deficiency anemias: Secondary | ICD-10-CM

## 2017-07-19 DIAGNOSIS — D509 Iron deficiency anemia, unspecified: Secondary | ICD-10-CM | POA: Diagnosis not present

## 2017-07-19 DIAGNOSIS — D638 Anemia in other chronic diseases classified elsewhere: Secondary | ICD-10-CM

## 2017-07-19 NOTE — Progress Notes (Signed)
[facility] OFFICE PROGRESS NOTE  Lajean Manes, MD 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   PRIOR THERAPY: 1) PRBCs transfusion as needed   CURRENT THERAPY: 1) Intravenous Feraheme infusion on as-needed basis. Last dose was given on last infusion was on 09/18/2015 and 09/28/2015. 2) vitamin B12 injection on monthly basis.  INTERVAL HISTORY: Kim [name] 77 y.o. female returns for routine follow-up visit by herself.  Since her last visit, the patient had a fracture of her left femur and had surgery to repair this.  She has recovered well.  The patient is feeling fine today has no specific complaints area she is currently off treatment for her Crohn's disease.  She denies fevers and chills.  Denies chest pain, shortness of breath, cough, hemoptysis.  Denies nausea, vomiting, constipation, diarrhea.  The patient is here for evaluation and repeat lab work.  MEDICAL HISTORY: Past Medical History:  Diagnosis Date  . Anemia    related to malabsorption--gets B12 shots and iron infusions, managed by Dr. Julien Nordmann  . BCC (basal cell carcinoma of skin)   . Crohn disease ([place])    Dr. Emelda Fear at [facility]  . Glaucoma    Dr. Janyth Contes  . Intestinal malabsorption   . OA (osteoarthritis)    hands,back  . Osteoporosis    managed by Dr. Nevada Crane at [facility], [place] q2 yrs    ALLERGIES:  is allergic to antihistamines, chlorpheniramine-type; metronidazole; other; sulfa antibiotics; and tape.  MEDICATIONS:  Current Outpatient Medications  Medication Sig Dispense Refill  . acetaminophen (TYLENOL) 500 MG tablet Take 500 mg by mouth 3 (three) times daily.     . cholecalciferol (VITAMIN D) 1000 UNITS tablet Take 2,000 Units by mouth daily.     . cyanocobalamin (,VITAMIN B-12,) 1000 MCG/ML injection INJECT 1 MILLILITER INTO MUSCLE EVERY 30 DAYS 3 mL 2  .  dorzolamide-timolol (COSOPT) 22.3-6.8 MG/ML ophthalmic solution Place 1 drop into both eyes every 12 (twelve) hours.     . gabapentin (NEURONTIN) 300 MG capsule Take 300 mg by mouth 3 (three) times daily.     Marland Kitchen latanoprost (XALATAN) 0.005 % ophthalmic solution Place 1 drop into both eyes at bedtime.     . Melatonin 10 MG TBCR Take 10 mg by mouth at bedtime as needed for sleep.    . Multiple Vitamins-Minerals (ONE-A-DAY WOMENS 50+ ADVANTAGE) TABS Take 1 tablet by mouth daily.    . brimonidine (ALPHAGAN) 0.2 % ophthalmic solution INSTILL 1 DROP INTO BOTH EYES TWICE A DAY  1   No current facility-administered medications for this visit.    Facility-Administered Medications Ordered in Other Visits  Medication Dose Route Frequency Provider Last Rate Last Dose  . acetaminophen (TYLENOL) tablet 650 mg  650 mg Oral Once Carlton Adam, PA-C        SURGICAL HISTORY:  Past Surgical History:  Procedure Laterality Date  . BREAST EXCISIONAL BIOPSY Right   . Crohn  2009  . internal ostomy pouch  1994  . INTRAMEDULLARY (IM) NAIL INTERTROCHANTERIC Left 03/05/2016   Procedure: INTRAMEDULLARY (IM) NAIL INTERTROCHANTRIC;  Surgeon: Nicholes Stairs, MD;  Location: [facility];  Service: Orthopedics;  Laterality: Left;  . TOTAL COLECTOMY  1984   due to Crohn's    REVIEW OF SYSTEMS:   Review of Systems  Constitutional: Negative for appetite change, chills, fatigue, fever and unexpected weight change.  HENT:   Negative  for mouth sores, nosebleeds, sore throat and trouble swallowing.   Eyes: Negative for eye problems and icterus.  Respiratory: Negative for cough, hemoptysis, shortness of breath and wheezing.   Cardiovascular: Negative for chest pain and leg swelling.  Gastrointestinal: Negative for abdominal pain, constipation, diarrhea, nausea and vomiting.  Genitourinary: Negative for bladder incontinence, difficulty urinating, dysuria, frequency and hematuria.   Musculoskeletal: Negative for back pain,  gait problem, neck pain and neck stiffness.  Skin: Negative for itching and rash.  Neurological: Negative for dizziness, extremity weakness, gait problem, headaches, light-headedness and seizures.  Hematological: Negative for adenopathy. Does not bruise/bleed easily.  Psychiatric/Behavioral: Negative for confusion, depression and sleep disturbance. The patient is not nervous/anxious.     PHYSICAL EXAMINATION:  Blood pressure 102/66, pulse 65, temperature (!) 97.3 F (36.3 C), temperature source Oral, resp. rate 18, SpO2 99 %.  Refused to be weighed.  ECOG PERFORMANCE STATUS: 1 - Symptomatic but completely ambulatory  Physical Exam  Constitutional: Oriented to person, place, and time and well-developed, well-nourished, and in no distress. No distress.  HENT:  Head: Normocephalic and atraumatic.  Mouth/Throat: Oropharynx is clear and moist. No oropharyngeal exudate.  Eyes: Conjunctivae are normal. Right eye exhibits no discharge. Left eye exhibits no discharge. No scleral icterus.  Neck: Normal range of motion. Neck supple.  Cardiovascular: Normal rate, regular rhythm, normal heart sounds and intact distal pulses.   Pulmonary/Chest: Effort normal and breath sounds normal. No respiratory distress. No wheezes. No rales.  Abdominal: Soft. Bowel sounds are normal. Exhibits no distension and no mass. There is no tenderness. Patient has ileostomy. Musculoskeletal: Normal range of motion. Exhibits no edema.  Lymphadenopathy:    No cervical adenopathy.  Neurological: Alert and oriented to person, place, and time. Exhibits normal muscle tone. Gait normal. Coordination normal.  Skin: Skin is warm and dry. No rash noted. Not diaphoretic. No erythema. No pallor.  Psychiatric: Mood, memory and judgment normal.  Vitals reviewed.  LABORATORY DATA: Lab Results  Component Value Date   WBC 4.5 06/08/2017   HGB 11.0 (L) 06/08/2017   HCT 34.1 (L) 06/08/2017   MCV 100.6 06/08/2017   PLT 137 (L)  06/08/2017      Chemistry      Component Value Date/Time   NA 140 11/17/2016 1134   K 3.9 11/17/2016 1134   CL 103 03/21/2016 0520   CO2 22 11/17/2016 1134   BUN 29.7 (H) 11/17/2016 1134   CREATININE 1.4 (H) 11/17/2016 1134      Component Value Date/Time   CALCIUM 10.0 11/17/2016 1134   ALKPHOS 74 11/17/2016 1134   AST 21 11/17/2016 1134   ALT 19 11/17/2016 1134   BILITOT 0.36 11/17/2016 1134     Iron 98, TIBC 258, U IBC 160, percent saturation 38%, ferritin 1094.  RADIOGRAPHIC STUDIES:  Ct Chest Wo Contrast  Addendum Date: 07/14/2017   ADDENDUM REPORT: 07/14/2017 14:46 ADDENDUM: Add to IMPRESSION: Small pericardial effusion. Electronically Signed   By: William  Woodruff III M.D.   On: 07/14/2017 14:46   Addendum Date: 07/14/2017   ADDENDUM REPORT: 07/14/2017 13:54 ADDENDUM: These results will be called to the ordering clinician or representative by the Radiologist Assistant, and communication documented in the PACS or zVision Dashboard. Electronically Signed   By: William  Woodruff III M.D.   On: 07/14/2017 13:54   Result Date: 07/14/2017 CLINICAL DATA:  Pulmonary nodule EXAM: CT CHEST WITHOUT CONTRAST TECHNIQUE: Multidetector CT imaging of the chest was performed following the standard protocol without IV contrast.   COMPARISON:  Chest radiograph March 15, 2016 and chest CT June 09, 2016 FINDINGS: Cardiovascular: There is no appreciable thoracic aortic aneurysm. Visualized great vessels appear normal. There are foci of calcification in the thoracic aorta. There also foci of coronary artery calcification. There is a small pericardial effusion. Mediastinum/Nodes: Thyroid appears unremarkable. There is no appreciable thoracic adenopathy. No esophageal lesions are evident. Lungs/Pleura: There is mild underlying centrilobular emphysematous change. On axial slice 61 series 3, there is a stable 3 mm nodular opacity in the superior segment right lower lobe. On axial slice 83 series 3, there  is a 4 mm nodular opacity in the posterior segment of the left lower lobe which is stable. On axial slice 68 series 3, there is an area of new localized pleural thickening in the posterior aspect of the superior segment of the right lower lobe with a nodular appearing opacity within this area measuring 6 x 6 mm. There is a new area of tree on bud type appearance in the lateral segment of the right middle lobe, best seen on axial slice 84 series 3. This area is concerning for a small focus of pneumonia in this area. No similar changes are noted elsewhere. There is no frank consolidation on this study. No edema is appreciable. There is slight lower lobe and upper lobe bronchiectatic change bilaterally. Upper Abdomen: In the visualized upper abdomen, there is incomplete visualization of several calculi in the upper pole of the left kidney posteriorly, largest measuring 6 mm. There is incomplete visualization of a cyst arising from the anterior upper to mid left kidney. There is aortic atherosclerosis. Musculoskeletal: There are foci of degenerative change in the thoracic spine. There are no blastic or lytic bone lesions. IMPRESSION: 1. New 6 x 6 mm nodular opacity right lower lobe. Other smaller nodular opacities in each lower lobe are stable. Non-contrast chest CT at 6-12 months is recommended. If the nodule is stable at time of repeat CT, then future CT at 18-24 months (from today's scan) is considered optional for low-risk patients, but is recommended for high-risk patients. This recommendation follows the consensus statement: Guidelines for Management of Incidental Pulmonary Nodules Detected on CT Images: From the Fleischner Society 2017; Radiology 2017; 284:228-243. 2. Tree on bud type appearance in the lateral segment right middle lobe, a finding felt to be most indicative of an area of focal pneumonia. No consolidation. 3. Underlying relatively mild centrilobular emphysematous change with mild upper and lower  lobe bronchiectatic change. 4. Aortic atherosclerosis. No aneurysm. Foci of coronary artery calcification noted. 5.  No adenopathy evident. 6. Incomplete visualization of calculi in the upper pole left kidney. Aortic Atherosclerosis (ICD10-I70.0) and Emphysema (ICD10-J43.9). Electronically Signed: By: William  Woodruff III M.D. On: 07/14/2017 13:48     ASSESSMENT/PLAN:  This is a very pleasant 76 year old white female with history of iron deficiency anemia/anemia of chronic disease secondary to malabsorption and Crohn's disease. The patient require has required Feraheme infusions on an intermittent basis because of her blood loss from the colostomy as well as the malabsorption from Crohn's disease. She has had no recent blood transfusions or IV iron. Her recent CBC showed hemoglobin of 11.0 and her iron study and ferritin are unremarkable for any deficiency.  The patient was seen with Dr. Mohamed.  She is questioning why her ferritin remains elevated despite the fact that her Crohn's is well controlled and she is not currently on medication for this.  She reports that her recent C-reactive protein was   normal.  We discussed with the patient that although her Crohn's is controlled, she likely still has low levels of baseline Inflammation in her body which could elevate her ferritin level.  Compared to her previous labs 6 months ago, her ferritin level has declined somewhat. Recommend for the patient to continue observation with a repeat CBC, iron studies, and ferritin in 6 months.  The patient does not like to get stuck frequently for lab draws and we will order a vitamin D level and a vitamin B12 level per her request.  She is also curious about her potassium level, and we will check a C met with her next visit in 6 months.  She will talk to her gastroenterologist at [facility] regarding when the next C-reactive protein level is needed and she can bring a prescription from that physician for our lab to draw  this.  She is also planning to ask if magnesium level would be indicated which will defer to her gastroenterologist. Follow-up visit will be in 6 months to review her lab results. The patient voices understanding of current disease status and treatment options and is in agreement with the current care plan. All questions were answered. The patient knows to call the clinic with any problems, questions or concerns. We can certainly see the patient much sooner if necessary.  Orders Placed This Encounter  Procedures  . CBC with Differential (Cancer Center Only)    Standing Status:   Future    Standing Expiration Date:   07/19/2018  . Iron and TIBC    Standing Status:   Future    Standing Expiration Date:   07/19/2018  . Ferritin    Standing Status:   Future    Standing Expiration Date:   07/19/2018  . Vitamin D 25 hydroxy    Standing Status:   Future    Standing Expiration Date:   07/19/2018  . CMP (Cancer Center only)    Standing Status:   Future    Standing Expiration Date:   07/19/2018  . Vitamin B12    Standing Status:   Future    Standing Expiration Date:   07/19/2018     , DNP, AGPCNP-BC, AOCNP 07/19/17   ADDENDUM: Hematology/Oncology Attending: I had a face-to-face encounter with the patient today.  I recommended her care plan.  This is a very pleasant 76 years old white female with history of iron deficiency anemia and disease secondary to Crohn's disease.  She continues to have mild bleeding from around the exit site of the ostomy.  The patient has been doing very well over the last several months except for mild fatigue.  She had left hip fracture a few months ago and she had surgery at [facility].  She had repeat CBC, iron study and ferritin performed recently and she is here for evaluation and discussion of her lab results and recommendation regarding her condition. She continues to have elevated ferritin level which is likely reactive in nature secondary to her  inflammatory bowel disease. The patient had several questions today about her condition and the abnormalities of her lab. I recommended for her to continue with her current treatment of the monthly vitamin B12 injection as well as vitamin the and calcium as prescribed by her primary care physician and orthopedic surgeon. We will see the patient back for follow-up visit in 6 months for evaluation with repeat CBC, comprehensive metabolic panel, iron study, ferritin, vitamin D and vitamin B12 level. She was advised to call   immediately if she has any concerning symptoms in the interval.  Disclaimer: This note was dictated with voice recognition software. Similar sounding words can inadvertently be transcribed and may be missed upon review. Mohamed K Mohamed, MD 07/19/17  

## 2017-07-19 NOTE — Telephone Encounter (Signed)
Printed avs and calender for upcoming appointment. Per 2/123 los.

## 2017-08-01 NOTE — Telephone Encounter (Signed)
I JUST SAW THIS MESSAGE. THIS MAY HAVE BEEN AN OUTSIDE DR ORDER. DOES SHE STILL NEED THIS?

## 2017-09-08 ENCOUNTER — Other Ambulatory Visit: Payer: Self-pay | Admitting: Geriatric Medicine

## 2017-09-08 DIAGNOSIS — Z1231 Encounter for screening mammogram for malignant neoplasm of breast: Secondary | ICD-10-CM

## 2017-11-20 ENCOUNTER — Other Ambulatory Visit: Payer: Self-pay | Admitting: Geriatric Medicine

## 2017-11-20 ENCOUNTER — Other Ambulatory Visit: Payer: Self-pay | Admitting: Plastic Surgery

## 2017-11-20 ENCOUNTER — Ambulatory Visit
Admission: RE | Admit: 2017-11-20 | Discharge: 2017-11-20 | Disposition: A | Discharge status: Home / Self Care | Payer: Medicare Other | Source: Ambulatory Visit | Attending: Geriatric Medicine | Admitting: Geriatric Medicine

## 2017-11-20 DIAGNOSIS — Z1231 Encounter for screening mammogram for malignant neoplasm of breast: Secondary | ICD-10-CM

## 2017-11-20 DIAGNOSIS — N63 Unspecified lump in unspecified breast: Secondary | ICD-10-CM

## 2017-11-21 ENCOUNTER — Ambulatory Visit
Admission: RE | Admit: 2017-11-21 | Discharge: 2017-11-21 | Disposition: A | Discharge status: Home / Self Care | Payer: Medicare Other | Source: Ambulatory Visit | Attending: Plastic Surgery | Admitting: Plastic Surgery

## 2017-11-21 ENCOUNTER — Other Ambulatory Visit: Payer: Self-pay | Admitting: Geriatric Medicine

## 2017-11-21 ENCOUNTER — Ambulatory Visit
Admission: RE | Admit: 2017-11-21 | Discharge: 2017-11-21 | Disposition: A | Discharge status: Home / Self Care | Payer: Medicare Other | Source: Ambulatory Visit | Attending: Geriatric Medicine | Admitting: Geriatric Medicine

## 2017-11-21 DIAGNOSIS — N63 Unspecified lump in unspecified breast: Secondary | ICD-10-CM

## 2018-01-12 ENCOUNTER — Inpatient Hospital Stay: Payer: Medicare Other | Attending: Internal Medicine

## 2018-01-12 DIAGNOSIS — D509 Iron deficiency anemia, unspecified: Secondary | ICD-10-CM | POA: Insufficient documentation

## 2018-01-12 DIAGNOSIS — M810 Age-related osteoporosis without current pathological fracture: Secondary | ICD-10-CM | POA: Insufficient documentation

## 2018-01-12 DIAGNOSIS — R5383 Other fatigue: Secondary | ICD-10-CM | POA: Diagnosis not present

## 2018-01-12 DIAGNOSIS — Z79899 Other long term (current) drug therapy: Secondary | ICD-10-CM | POA: Diagnosis not present

## 2018-01-12 DIAGNOSIS — Z85038 Personal history of other malignant neoplasm of large intestine: Secondary | ICD-10-CM | POA: Insufficient documentation

## 2018-01-12 DIAGNOSIS — K909 Intestinal malabsorption, unspecified: Secondary | ICD-10-CM | POA: Diagnosis not present

## 2018-01-12 DIAGNOSIS — D638 Anemia in other chronic diseases classified elsewhere: Secondary | ICD-10-CM

## 2018-01-12 DIAGNOSIS — K5090 Crohn's disease, unspecified, without complications: Secondary | ICD-10-CM | POA: Insufficient documentation

## 2018-01-12 DIAGNOSIS — M1990 Unspecified osteoarthritis, unspecified site: Secondary | ICD-10-CM | POA: Diagnosis not present

## 2018-01-12 DIAGNOSIS — D508 Other iron deficiency anemias: Secondary | ICD-10-CM

## 2018-01-12 LAB — CBC WITH DIFFERENTIAL (CANCER CENTER ONLY)
BASOS ABS: 0 10*3/uL (ref 0.0–0.1)
Basophils Relative: 1 %
EOS ABS: 0.1 10*3/uL (ref 0.0–0.5)
Eosinophils Relative: 2 %
HEMATOCRIT: 32.7 % — AB (ref 34.8–46.6)
HEMOGLOBIN: 10.7 g/dL — AB (ref 11.6–15.9)
Lymphocytes Relative: 22 %
Lymphs Abs: 1.1 10*3/uL (ref 0.9–3.3)
MCH: 32 pg (ref 25.1–34.0)
MCHC: 32.7 g/dL (ref 31.5–36.0)
MCV: 97.8 fL (ref 79.5–101.0)
MONO ABS: 0.3 10*3/uL (ref 0.1–0.9)
Monocytes Relative: 5 %
NEUTROS ABS: 3.4 10*3/uL (ref 1.5–6.5)
NEUTROS PCT: 70 %
Platelet Count: 118 10*3/uL — ABNORMAL LOW (ref 145–400)
RBC: 3.35 MIL/uL — ABNORMAL LOW (ref 3.70–5.45)
RDW: 14.9 % — AB (ref 11.2–14.5)
WBC Count: 4.8 10*3/uL (ref 3.9–10.3)

## 2018-01-12 LAB — CMP (CANCER CENTER ONLY)
ALT: 29 U/L (ref 0–44)
AST: 24 U/L (ref 15–41)
Albumin: 3.8 g/dL (ref 3.5–5.0)
Alkaline Phosphatase: 70 U/L (ref 38–126)
Anion gap: 10 (ref 5–15)
BILIRUBIN TOTAL: 0.3 mg/dL (ref 0.3–1.2)
BUN: 33 mg/dL — AB (ref 8–23)
CHLORIDE: 108 mmol/L (ref 98–111)
CO2: 24 mmol/L (ref 22–32)
Calcium: 9.5 mg/dL (ref 8.9–10.3)
Creatinine: 1.51 mg/dL — ABNORMAL HIGH (ref 0.44–1.00)
GFR, Est AFR Am: 38 mL/min — ABNORMAL LOW (ref >60)
GFR, Estimated: 32 mL/min — ABNORMAL LOW (ref >60)
GLUCOSE: 109 mg/dL — AB (ref 70–99)
POTASSIUM: 3.6 mmol/L (ref 3.5–5.1)
SODIUM: 142 mmol/L (ref 135–145)
Total Protein: 7.5 g/dL (ref 6.5–8.1)

## 2018-01-12 LAB — VITAMIN B12: Vitamin B-12: 476 pg/mL (ref 180–914)

## 2018-01-12 LAB — IRON AND TIBC
IRON: 97 ug/dL (ref 41–142)
SATURATION RATIOS: 33 % (ref 21–57)
TIBC: 291 ug/dL (ref 236–444)
UIBC: 195 ug/dL

## 2018-01-12 LAB — FERRITIN: Ferritin: 976 ng/mL — ABNORMAL HIGH (ref 11–307)

## 2018-01-13 LAB — VITAMIN D 25 HYDROXY (VIT D DEFICIENCY, FRACTURES): Vit D, 25-Hydroxy: 31.5 ng/mL (ref 30.0–100.0)

## 2018-01-17 ENCOUNTER — Inpatient Hospital Stay (HOSPITAL_BASED_OUTPATIENT_CLINIC_OR_DEPARTMENT_OTHER): Payer: Medicare Other | Admitting: Internal Medicine

## 2018-01-17 ENCOUNTER — Telehealth: Payer: Self-pay | Admitting: Internal Medicine

## 2018-01-17 ENCOUNTER — Encounter: Payer: Self-pay | Admitting: Internal Medicine

## 2018-01-17 VITALS — BP 132/52 | HR 62 | Temp 97.7°F | Resp 18 | Ht <= 58 in | Wt 101.0 lb

## 2018-01-17 DIAGNOSIS — K5090 Crohn's disease, unspecified, without complications: Secondary | ICD-10-CM | POA: Diagnosis not present

## 2018-01-17 DIAGNOSIS — Z79899 Other long term (current) drug therapy: Secondary | ICD-10-CM

## 2018-01-17 DIAGNOSIS — M1990 Unspecified osteoarthritis, unspecified site: Secondary | ICD-10-CM

## 2018-01-17 DIAGNOSIS — K909 Intestinal malabsorption, unspecified: Secondary | ICD-10-CM

## 2018-01-17 DIAGNOSIS — R5383 Other fatigue: Secondary | ICD-10-CM | POA: Diagnosis not present

## 2018-01-17 DIAGNOSIS — K50813 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: Secondary | ICD-10-CM

## 2018-01-17 DIAGNOSIS — D509 Iron deficiency anemia, unspecified: Secondary | ICD-10-CM | POA: Diagnosis not present

## 2018-01-17 DIAGNOSIS — M810 Age-related osteoporosis without current pathological fracture: Secondary | ICD-10-CM

## 2018-01-17 DIAGNOSIS — Z85038 Personal history of other malignant neoplasm of large intestine: Secondary | ICD-10-CM

## 2018-01-17 DIAGNOSIS — D638 Anemia in other chronic diseases classified elsewhere: Secondary | ICD-10-CM

## 2018-01-17 DIAGNOSIS — D508 Other iron deficiency anemias: Secondary | ICD-10-CM

## 2018-01-17 NOTE — Telephone Encounter (Signed)
Gave patient avs and calendar of upcoming appts.

## 2018-01-17 NOTE — Progress Notes (Signed)
[facility] [phone]   [phone]  OFFICE PROGRESS NOTE  Lajean Manes, MD 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   PRIOR THERAPY:  1) PRBCs transfusion as needed last was given few days ago.   CURRENT THERAPY:  1) Intravenous Feraheme infusion on as-needed basis. Last dose was given on last infusion was on 09/18/2015 2) vitamin B12 injection on monthly basis.  INTERVAL HISTORY: Kim [name] 77 y.o. female returns to the clinic today for six-month follow-up visit.  The patient is feeling fine today with no concerning complaints except for mild fatigue.  She has not required any Feraheme infusion or blood transfusion for the last 6 months.  She denied having any chest pain, shortness breath, cough or hemoptysis.  She denied having any fever or chills.  She has no nausea, vomiting, diarrhea or constipation.  She is here today for evaluation with repeat CBC, comprehensive metabolic panel, vitamin D as well as vitamin B12 level.  MEDICAL HISTORY: Past Medical History:  Diagnosis Date  . Anemia    related to malabsorption--gets B12 shots and iron infusions, managed by Dr. Julien Nordmann  . BCC (basal cell carcinoma of skin)   . Crohn disease ([place])    Dr. Emelda Fear at [facility]  . Glaucoma    Dr. Janyth Contes  . Intestinal malabsorption   . OA (osteoarthritis)    hands,back  . Osteoporosis    managed by Dr. Nevada Crane at [facility], [place] q2 yrs    ALLERGIES:  is allergic to antihistamines, chlorpheniramine-type; metronidazole; other; sulfa antibiotics; and tape.  MEDICATIONS:  Current Outpatient Medications  Medication Sig Dispense Refill  . acetaminophen (TYLENOL) 500 MG tablet Take 500 mg by mouth 3 (three) times daily.     . brimonidine (ALPHAGAN) 0.2 % ophthalmic solution INSTILL 1 DROP INTO BOTH EYES TWICE A DAY  1  .  cholecalciferol (VITAMIN D) 1000 UNITS tablet Take 2,000 Units by mouth daily.     . clobetasol cream (TEMOVATE) 0.05 % Apply topically.    . cyanocobalamin (,VITAMIN B-12,) 1000 MCG/ML injection INJECT 1 MILLILITER INTO MUSCLE EVERY 30 DAYS 3 mL 2  . dorzolamide-timolol (COSOPT) 22.3-6.8 MG/ML ophthalmic solution Place 1 drop into both eyes every 12 (twelve) hours.     . gabapentin (NEURONTIN) 300 MG capsule Take 300 mg by mouth 3 (three) times daily.     Marland Kitchen latanoprost (XALATAN) 0.005 % ophthalmic solution Place 1 drop into both eyes at bedtime.     Marland Kitchen latanoprost (XALATAN) 0.005 % ophthalmic solution Place 1 drop into both eyes daily.    . Melatonin 10 MG TBCR Take 10 mg by mouth at bedtime as needed for sleep.    . Multiple Vitamins-Minerals (ONE-A-DAY WOMENS 50+ ADVANTAGE) TABS Take 1 tablet by mouth daily.    . prednisoLONE acetate (PRED FORTE) 1 % ophthalmic suspension Place 1 drop into the right eye 4 times daily.    . prednisoLONE acetate (PRED FORTE) 1 % ophthalmic suspension PLACE 1 DROP INTO THE RIGHT EYE 4 TIMES DAILY.  3  . risedronate (ACTONEL) 35 MG tablet TAKE 1 TABLET BY MOUTH EVERY 7 DAYS WITH A FULL GLASS OF WATER. DO NOT LIE DOWN FOR THE NEXT 30 MIN.  3   No current facility-administered medications for this visit.    Facility-Administered Medications Ordered in Other Visits  Medication Dose Route Frequency Provider Last  Rate Last Dose  . acetaminophen (TYLENOL) tablet 650 mg  650 mg Oral Once Carlton Adam, PA-C        SURGICAL HISTORY:  Past Surgical History:  Procedure Laterality Date  . BREAST EXCISIONAL BIOPSY Right   . Crohn  2009  . internal ostomy pouch  1994  . INTRAMEDULLARY (IM) NAIL INTERTROCHANTERIC Left 03/05/2016   Procedure: INTRAMEDULLARY (IM) NAIL INTERTROCHANTRIC;  Surgeon: Nicholes Stairs, MD;  Location: [facility];  Service: Orthopedics;  Laterality: Left;  . REDUCTION MAMMAPLASTY Bilateral 01/2017  . TOTAL COLECTOMY  1984   due to Crohn's     REVIEW OF SYSTEMS:  A comprehensive review of systems was negative except for: Constitutional: positive for fatigue   PHYSICAL EXAMINATION: General appearance: alert, cooperative, fatigued and no distress Head: Normocephalic, without obvious abnormality, atraumatic Neck: no adenopathy, no JVD, supple, symmetrical, trachea midline and thyroid not enlarged, symmetric, no tenderness/mass/nodules Lymph nodes: Cervical, supraclavicular, and axillary nodes normal. Resp: clear to auscultation bilaterally Back: symmetric, no curvature. ROM normal. No CVA tenderness. Cardio: regular rate and rhythm, S1, S2 normal, no murmur, click, rub or gallop GI: soft, non-tender; bowel sounds normal; no masses,  no organomegaly Extremities: extremities normal, atraumatic, no cyanosis or edema  ECOG PERFORMANCE STATUS: 1 - Symptomatic but completely ambulatory  Blood pressure (!) 132/52, pulse 62, temperature 97.7 F (36.5 C), temperature source Oral, resp. rate 18, height 4' 9"  (1.448 m), weight 101 lb (45.8 kg), SpO2 99 %.  LABORATORY DATA: Lab Results  Component Value Date   WBC 4.8 01/12/2018   HGB 10.7 (L) 01/12/2018   HCT 32.7 (L) 01/12/2018   MCV 97.8 01/12/2018   PLT 118 (L) 01/12/2018      Chemistry      Component Value Date/Time   NA 142 01/12/2018 1353   NA 140 11/17/2016 1134   K 3.6 01/12/2018 1353   K 3.9 11/17/2016 1134   CL 108 01/12/2018 1353   CO2 24 01/12/2018 1353   CO2 22 11/17/2016 1134   BUN 33 (H) 01/12/2018 1353   BUN 29.7 (H) 11/17/2016 1134   CREATININE 1.51 (H) 01/12/2018 1353   CREATININE 1.4 (H) 11/17/2016 1134      Component Value Date/Time   CALCIUM 9.5 01/12/2018 1353   CALCIUM 10.0 11/17/2016 1134   ALKPHOS 70 01/12/2018 1353   ALKPHOS 74 11/17/2016 1134   AST 24 01/12/2018 1353   AST 21 11/17/2016 1134   ALT 29 01/12/2018 1353   ALT 19 11/17/2016 1134   BILITOT 0.3 01/12/2018 1353   BILITOT 0.36 11/17/2016 1134     Other lab results: Ferritin  976, serum iron 97, total iron binding capacity 291 and iron saturation 33 %.   RADIOGRAPHIC STUDIES: No results found.   ASSESSMENT AND PLAN:  This is a very pleasant 77 years old white female with history of iron deficiency anemia/anemia of chronic disease secondary to malabsorption and Crohn's disease. The patient require Feraheme infusion at intermittent basis because of her blood loss from the colostomy as well as the malabsorption from Crohn's disease. Recent blood work including CBC showed a low but stable hemoglobin and hematocrit.  Iron study is unremarkable.  Ferritin level is elevated secondary to acute phase reaction secondary to her Crohn's disease. I discussed the lab results with the patient and recommended for her to continue on observation with repeat CBC, complaints metabolic panel, iron study, ferritin and vitamin B12 level in 6 months. She was advised to call immediately if  she has any concerning symptoms in the interval. The patient will continue her treatment with intravenous Vedolizumab under the care of Dr. Emelda Fear at [facility]. The patient voices understanding of current disease status and treatment options and is in agreement with the current care plan. All questions were answered. The patient knows to call the clinic with any problems, questions or concerns. We can certainly see the patient much sooner if necessary.  Disclaimer: This note was dictated with voice recognition software. Similar sounding words can inadvertently be transcribed and may be missed upon review.

## 2018-01-19 ENCOUNTER — Other Ambulatory Visit: Payer: Self-pay | Admitting: Geriatric Medicine

## 2018-01-19 DIAGNOSIS — R911 Solitary pulmonary nodule: Secondary | ICD-10-CM

## 2018-01-22 ENCOUNTER — Other Ambulatory Visit: Payer: Medicare Other

## 2018-01-26 ENCOUNTER — Ambulatory Visit
Admission: RE | Admit: 2018-01-26 | Discharge: 2018-01-26 | Disposition: A | Discharge status: Home / Self Care | Payer: Medicare Other | Source: Ambulatory Visit | Attending: Geriatric Medicine | Admitting: Geriatric Medicine

## 2018-01-26 DIAGNOSIS — R911 Solitary pulmonary nodule: Secondary | ICD-10-CM

## 2018-03-20 ENCOUNTER — Ambulatory Visit: Payer: Medicare Other | Admitting: Plastic Surgery

## 2018-03-20 ENCOUNTER — Encounter: Payer: Self-pay | Admitting: Plastic Surgery

## 2018-03-20 VITALS — Resp 12

## 2018-03-20 DIAGNOSIS — K50813 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: Secondary | ICD-10-CM | POA: Diagnosis not present

## 2018-03-20 DIAGNOSIS — N6489 Other specified disorders of breast: Secondary | ICD-10-CM | POA: Insufficient documentation

## 2018-03-20 NOTE — Progress Notes (Signed)
   Subjective:    Patient ID: Kim [name], female    DOB: [birth year], 77 y.o.   MRN: [id number]  The patient is a 77 year old white female here for follow-up on her breast reduction.  She had the reduction with liposuction of the flanks over a year ago.  The breasts particularly on the right breast was interfering with her ability to change her ostomy.  She did well from the surgeries.  There is no sign of infection no lumps or masses.  She is overall happy with the results.  Today she is requesting further surgery of the right breast because of some asymmetry.  The right breast hangs 1 to 2 cm lower than the left.  She is also interested in more liposuction of her flank area.  She has multiple medical conditions including a right-sided ostomy.   Review of Systems  Constitutional: Negative.   HENT: Negative.   Eyes: Negative.   Respiratory: Negative.   Cardiovascular: Negative.   Gastrointestinal: Negative.   Endocrine: Negative.   Genitourinary: Negative.   Musculoskeletal: Negative.   Skin: Negative.  Negative for color change and wound.  Neurological: Negative.   Hematological: Negative.   Psychiatric/Behavioral: Negative.       Objective:   Physical Exam  Constitutional: She appears well-developed and well-nourished.  HENT:  Head: Normocephalic and atraumatic.  Eyes: Pupils are equal, round, and reactive to light.  Cardiovascular: Normal rate.  Pulmonary/Chest: Effort normal.  Neurological: She is alert.  Skin: Skin is warm.  Psychiatric: She has a normal mood and affect. Her behavior is normal. Judgment and thought content normal.      Assessment & Plan:  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 (HCC)  Postoperative breast asymmetry I think the patient would benefit from a revision of the right breast reduction to help tighten up the skin.  This could be done in the office with excision of ~ 3 x 7 cm of skin and fat from the lower right breast. I do  not feel comfortable doing any further liposuction on her flanks.  I have recommended the possibility of her looking into cool sculpting and have made the appropriate referral.

## 2018-04-03 ENCOUNTER — Other Ambulatory Visit: Payer: Self-pay | Admitting: Internal Medicine

## 2018-04-03 DIAGNOSIS — K50819 Crohn's disease of both small and large intestine with unspecified complications: Secondary | ICD-10-CM

## 2018-04-09 ENCOUNTER — Ambulatory Visit
Admission: RE | Admit: 2018-04-09 | Discharge: 2018-04-09 | Disposition: A | Discharge status: Home / Self Care | Payer: Self-pay | Source: Ambulatory Visit | Attending: Internal Medicine | Admitting: Internal Medicine

## 2018-04-09 ENCOUNTER — Other Ambulatory Visit: Payer: Self-pay | Admitting: Internal Medicine

## 2018-04-09 DIAGNOSIS — K50819 Crohn's disease of both small and large intestine with unspecified complications: Secondary | ICD-10-CM

## 2018-04-16 ENCOUNTER — Other Ambulatory Visit: Payer: Self-pay | Admitting: Internal Medicine

## 2018-04-16 DIAGNOSIS — K50819 Crohn's disease of both small and large intestine with unspecified complications: Secondary | ICD-10-CM

## 2018-04-17 ENCOUNTER — Ambulatory Visit
Admission: RE | Admit: 2018-04-17 | Discharge: 2018-04-17 | Disposition: A | Discharge status: Home / Self Care | Payer: Medicare Other | Source: Ambulatory Visit | Attending: Internal Medicine | Admitting: Internal Medicine

## 2018-04-17 ENCOUNTER — Inpatient Hospital Stay
Admission: RE | Admit: 2018-04-17 | Discharge: 2018-04-17 | Disposition: A | Discharge status: Home / Self Care | Payer: Medicare Other | Source: Ambulatory Visit | Attending: Internal Medicine | Admitting: Internal Medicine

## 2018-04-17 DIAGNOSIS — K50819 Crohn's disease of both small and large intestine with unspecified complications: Secondary | ICD-10-CM

## 2018-04-17 MED ORDER — GADOBENATE DIMEGLUMINE 529 MG/ML IV SOLN
5.0000 mL | Freq: Once | INTRAVENOUS | Status: AC | PRN
  Administered 2018-04-17: 5 mL via INTRAVENOUS

## 2018-06-21 ENCOUNTER — Other Ambulatory Visit: Payer: Self-pay | Admitting: Geriatric Medicine

## 2018-06-21 DIAGNOSIS — R911 Solitary pulmonary nodule: Secondary | ICD-10-CM

## 2018-06-21 DIAGNOSIS — IMO0001 Reserved for inherently not codable concepts without codable children: Secondary | ICD-10-CM

## 2018-06-22 ENCOUNTER — Ambulatory Visit: Payer: Medicare Other | Admitting: Plastic Surgery

## 2018-07-05 ENCOUNTER — Ambulatory Visit
Admission: RE | Admit: 2018-07-05 | Discharge: 2018-07-05 | Disposition: A | Discharge status: Home / Self Care | Payer: Medicare Other | Source: Ambulatory Visit | Attending: Geriatric Medicine | Admitting: Geriatric Medicine

## 2018-07-05 DIAGNOSIS — IMO0001 Reserved for inherently not codable concepts without codable children: Secondary | ICD-10-CM

## 2018-07-05 DIAGNOSIS — R911 Solitary pulmonary nodule: Secondary | ICD-10-CM

## 2018-07-12 ENCOUNTER — Inpatient Hospital Stay: Payer: Medicare Other | Attending: Internal Medicine

## 2018-07-12 DIAGNOSIS — Z933 Colostomy status: Secondary | ICD-10-CM | POA: Insufficient documentation

## 2018-07-12 DIAGNOSIS — K909 Intestinal malabsorption, unspecified: Secondary | ICD-10-CM | POA: Insufficient documentation

## 2018-07-12 DIAGNOSIS — M810 Age-related osteoporosis without current pathological fracture: Secondary | ICD-10-CM | POA: Insufficient documentation

## 2018-07-12 DIAGNOSIS — Z85828 Personal history of other malignant neoplasm of skin: Secondary | ICD-10-CM | POA: Diagnosis not present

## 2018-07-12 DIAGNOSIS — R5383 Other fatigue: Secondary | ICD-10-CM | POA: Diagnosis not present

## 2018-07-12 DIAGNOSIS — M1990 Unspecified osteoarthritis, unspecified site: Secondary | ICD-10-CM | POA: Diagnosis not present

## 2018-07-12 DIAGNOSIS — K5090 Crohn's disease, unspecified, without complications: Secondary | ICD-10-CM | POA: Diagnosis not present

## 2018-07-12 DIAGNOSIS — D508 Other iron deficiency anemias: Secondary | ICD-10-CM | POA: Diagnosis present

## 2018-07-12 DIAGNOSIS — E538 Deficiency of other specified B group vitamins: Secondary | ICD-10-CM | POA: Diagnosis not present

## 2018-07-12 DIAGNOSIS — Z79899 Other long term (current) drug therapy: Secondary | ICD-10-CM | POA: Diagnosis not present

## 2018-07-12 LAB — CMP (CANCER CENTER ONLY)
ALBUMIN: 3.7 g/dL (ref 3.5–5.0)
ALT: 41 U/L (ref 0–44)
AST: 31 U/L (ref 15–41)
Alkaline Phosphatase: 62 U/L (ref 38–126)
Anion gap: 8 (ref 5–15)
BILIRUBIN TOTAL: 0.7 mg/dL (ref 0.3–1.2)
BUN: 38 mg/dL — AB (ref 8–23)
CHLORIDE: 108 mmol/L (ref 98–111)
CO2: 24 mmol/L (ref 22–32)
Calcium: 9.3 mg/dL (ref 8.9–10.3)
Creatinine: 1.66 mg/dL — ABNORMAL HIGH (ref 0.44–1.00)
GFR, EST NON AFRICAN AMERICAN: 30 mL/min — AB (ref >60)
GFR, Est AFR Am: 34 mL/min — ABNORMAL LOW (ref >60)
Glucose, Bld: 97 mg/dL (ref 70–99)
POTASSIUM: 3.9 mmol/L (ref 3.5–5.1)
Sodium: 140 mmol/L (ref 135–145)
TOTAL PROTEIN: 7.2 g/dL (ref 6.5–8.1)

## 2018-07-12 LAB — CBC WITH DIFFERENTIAL (CANCER CENTER ONLY)
Abs Immature Granulocytes: 0.01 10*3/uL (ref 0.00–0.07)
BASOS ABS: 0 10*3/uL (ref 0.0–0.1)
BASOS PCT: 1 %
EOS ABS: 0.1 10*3/uL (ref 0.0–0.5)
Eosinophils Relative: 2 %
HCT: 34 % — ABNORMAL LOW (ref 36.0–46.0)
Hemoglobin: 11.1 g/dL — ABNORMAL LOW (ref 12.0–15.0)
IMMATURE GRANULOCYTES: 0 %
Lymphocytes Relative: 16 %
Lymphs Abs: 0.7 10*3/uL (ref 0.7–4.0)
MCH: 32.6 pg (ref 26.0–34.0)
MCHC: 32.6 g/dL (ref 30.0–36.0)
MCV: 100 fL (ref 80.0–100.0)
Monocytes Absolute: 0.3 10*3/uL (ref 0.1–1.0)
Monocytes Relative: 7 %
NEUTROS PCT: 74 %
NRBC: 0 % (ref 0.0–0.2)
Neutro Abs: 3.3 10*3/uL (ref 1.7–7.7)
Platelet Count: 97 10*3/uL — ABNORMAL LOW (ref 150–400)
RBC: 3.4 MIL/uL — ABNORMAL LOW (ref 3.87–5.11)
RDW: 12.5 % (ref 11.5–15.5)
WBC: 4.4 10*3/uL (ref 4.0–10.5)

## 2018-07-12 LAB — IRON AND TIBC
Iron: 109 ug/dL (ref 41–142)
SATURATION RATIOS: 40 % (ref 21–57)
TIBC: 271 ug/dL (ref 236–444)
UIBC: 162 ug/dL (ref 120–384)

## 2018-07-12 LAB — VITAMIN B12: Vitamin B-12: 848 pg/mL (ref 180–914)

## 2018-07-12 LAB — FERRITIN: FERRITIN: 961 ng/mL — AB (ref 11–307)

## 2018-07-13 ENCOUNTER — Ambulatory Visit: Payer: Medicare Other | Admitting: Plastic Surgery

## 2018-07-19 ENCOUNTER — Telehealth: Payer: Self-pay

## 2018-07-19 ENCOUNTER — Inpatient Hospital Stay (HOSPITAL_BASED_OUTPATIENT_CLINIC_OR_DEPARTMENT_OTHER): Payer: Medicare Other | Admitting: Internal Medicine

## 2018-07-19 ENCOUNTER — Encounter: Payer: Self-pay | Admitting: Internal Medicine

## 2018-07-19 VITALS — BP 133/58 | HR 63 | Temp 98.0°F | Resp 17 | Ht <= 58 in | Wt 102.6 lb

## 2018-07-19 DIAGNOSIS — Z933 Colostomy status: Secondary | ICD-10-CM

## 2018-07-19 DIAGNOSIS — R5383 Other fatigue: Secondary | ICD-10-CM

## 2018-07-19 DIAGNOSIS — M810 Age-related osteoporosis without current pathological fracture: Secondary | ICD-10-CM

## 2018-07-19 DIAGNOSIS — K909 Intestinal malabsorption, unspecified: Secondary | ICD-10-CM | POA: Diagnosis not present

## 2018-07-19 DIAGNOSIS — E538 Deficiency of other specified B group vitamins: Secondary | ICD-10-CM

## 2018-07-19 DIAGNOSIS — D638 Anemia in other chronic diseases classified elsewhere: Secondary | ICD-10-CM

## 2018-07-19 DIAGNOSIS — M1990 Unspecified osteoarthritis, unspecified site: Secondary | ICD-10-CM

## 2018-07-19 DIAGNOSIS — D508 Other iron deficiency anemias: Secondary | ICD-10-CM

## 2018-07-19 DIAGNOSIS — K5090 Crohn's disease, unspecified, without complications: Secondary | ICD-10-CM

## 2018-07-19 DIAGNOSIS — K50813 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: Secondary | ICD-10-CM

## 2018-07-19 DIAGNOSIS — Z79899 Other long term (current) drug therapy: Secondary | ICD-10-CM

## 2018-07-19 DIAGNOSIS — Z85828 Personal history of other malignant neoplasm of skin: Secondary | ICD-10-CM

## 2018-07-19 NOTE — Progress Notes (Signed)
[place] Telephone:(336) [phone]   Fax:(336) [phone]  OFFICE PROGRESS NOTE  Lajean Manes, MD 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   PRIOR THERAPY:  1) PRBCs transfusion as needed last was given few days ago.   CURRENT THERAPY:  1) Intravenous Feraheme infusion on as-needed basis. Last dose was given on last infusion was on 09/18/2015 2) vitamin B12 injection on monthly basis.  INTERVAL HISTORY: Kim [name] 78 y.o. female returns to the clinic today for follow-up visit.  The patient is feeling fine today with no concerning complaints except for mild fatigue.  She denied having any recent chest pain, shortness of breath, cough or hemoptysis.  She has no nausea, vomiting, diarrhea or constipation.  She denied having any headache or visual changes.  She denied having any recent weight loss or night sweats.  The patient had repeat anemia panel recently and she is here for evaluation and discussion of her lab results.  MEDICAL HISTORY: Past Medical History:  Diagnosis Date  . Anemia    related to malabsorption--gets B12 shots and iron infusions, managed by Dr. Julien Nordmann  . BCC (basal cell carcinoma of skin)   . Crohn disease ([place])    Dr. Emelda Fear at [facility]  . Glaucoma    Dr. Janyth Contes  . Intestinal malabsorption   . OA (osteoarthritis)    hands,back  . Osteoporosis    managed by Dr. Nevada Crane at [facility] [place], [place] q2 yrs    ALLERGIES:  is allergic to antihistamines, chlorpheniramine-type; chlorpheniramine; metronidazole; other; sulfa antibiotics; and tape.  MEDICATIONS:  Current Outpatient Medications  Medication Sig Dispense Refill  . acetaminophen (TYLENOL) 500 MG tablet Take 500 mg by mouth 3 (three) times daily.     . brimonidine (ALPHAGAN) 0.2 % ophthalmic solution INSTILL 1 DROP INTO BOTH EYES TWICE A DAY  1  . cholecalciferol  (VITAMIN D) 1000 UNITS tablet Take 2,000 Units by mouth daily.     . clobetasol cream (TEMOVATE) 0.05 % Apply topically.    . cyanocobalamin (,VITAMIN B-12,) 1000 MCG/ML injection INJECT 1 MILLILITER INTO MUSCLE EVERY 30 DAYS 3 mL 2  . dorzolamide-timolol (COSOPT) 22.3-6.8 MG/ML ophthalmic solution Place 1 drop into both eyes every 12 (twelve) hours.     . gabapentin (NEURONTIN) 300 MG capsule Take 300 mg by mouth 3 (three) times daily.     Marland Kitchen latanoprost (XALATAN) 0.005 % ophthalmic solution Place 1 drop into both eyes at bedtime.     Marland Kitchen latanoprost (XALATAN) 0.005 % ophthalmic solution Place 1 drop into both eyes daily.    . Melatonin 10 MG TBCR Take 10 mg by mouth at bedtime as needed for sleep.    . Multiple Vitamins-Minerals (ONE-A-DAY WOMENS 50+ ADVANTAGE) TABS Take 1 tablet by mouth daily.    . prednisoLONE acetate (PRED FORTE) 1 % ophthalmic suspension Place 1 drop into the right eye 4 times daily.    . prednisoLONE acetate (PRED FORTE) 1 % ophthalmic suspension PLACE 1 DROP INTO THE RIGHT EYE 4 TIMES DAILY.  3  . risedronate (ACTONEL) 35 MG tablet TAKE 1 TABLET BY MOUTH EVERY 7 DAYS WITH A FULL GLASS OF WATER. DO NOT LIE DOWN FOR THE NEXT 30 MIN.  3   No current facility-administered medications for this visit.    Facility-Administered Medications Ordered in Other Visits  Medication Dose Route Frequency Provider Last Rate Last Dose  .  acetaminophen (TYLENOL) tablet 650 mg  650 mg Oral Once Carlton Adam, PA-C        SURGICAL HISTORY:  Past Surgical History:  Procedure Laterality Date  . BREAST EXCISIONAL BIOPSY Right   . Crohn  2009  . internal ostomy pouch  1994  . INTRAMEDULLARY (IM) NAIL INTERTROCHANTERIC Left 03/05/2016   Procedure: INTRAMEDULLARY (IM) NAIL INTERTROCHANTRIC;  Surgeon: Nicholes Stairs, MD;  Location: [place];  Service: Orthopedics;  Laterality: Left;  . REDUCTION MAMMAPLASTY Bilateral 01/2017  . TOTAL COLECTOMY  1984   due to Crohn's    REVIEW OF  SYSTEMS:  A comprehensive review of systems was negative except for: Constitutional: positive for fatigue   PHYSICAL EXAMINATION: General appearance: alert, cooperative, fatigued and no distress Head: Normocephalic, without obvious abnormality, atraumatic Neck: no adenopathy, no JVD, supple, symmetrical, trachea midline and thyroid not enlarged, symmetric, no tenderness/mass/nodules Lymph nodes: Cervical, supraclavicular, and axillary nodes normal. Resp: clear to auscultation bilaterally Back: symmetric, no curvature. ROM normal. No CVA tenderness. Cardio: regular rate and rhythm, S1, S2 normal, no murmur, click, rub or gallop GI: soft, non-tender; bowel sounds normal; no masses,  no organomegaly Extremities: extremities normal, atraumatic, no cyanosis or edema  ECOG PERFORMANCE STATUS: 1 - Symptomatic but completely ambulatory  Blood pressure (!) 133/58, pulse 63, temperature 98 F (36.7 C), temperature source Oral, resp. rate 17, height 4' 9"  (1.448 m), weight 102 lb 9.6 oz (46.5 kg), SpO2 99 %.  LABORATORY DATA: Lab Results  Component Value Date   WBC 4.4 07/12/2018   HGB 11.1 (L) 07/12/2018   HCT 34.0 (L) 07/12/2018   MCV 100.0 07/12/2018   PLT 97 (L) 07/12/2018      Chemistry      Component Value Date/Time   NA 140 07/12/2018 1056   NA 140 11/17/2016 1134   K 3.9 07/12/2018 1056   K 3.9 11/17/2016 1134   CL 108 07/12/2018 1056   CO2 24 07/12/2018 1056   CO2 22 11/17/2016 1134   BUN 38 (H) 07/12/2018 1056   BUN 29.7 (H) 11/17/2016 1134   CREATININE 1.66 (H) 07/12/2018 1056   CREATININE 1.4 (H) 11/17/2016 1134      Component Value Date/Time   CALCIUM 9.3 07/12/2018 1056   CALCIUM 10.0 11/17/2016 1134   ALKPHOS 62 07/12/2018 1056   ALKPHOS 74 11/17/2016 1134   AST 31 07/12/2018 1056   AST 21 11/17/2016 1134   ALT 41 07/12/2018 1056   ALT 19 11/17/2016 1134   BILITOT 0.7 07/12/2018 1056   BILITOT 0.36 11/17/2016 1134     Other lab results: Ferritin 961, serum  iron 109, total iron binding capacity 271 and iron saturation 40 %.   RADIOGRAPHIC STUDIES: No results found.   ASSESSMENT AND PLAN:  This is a very pleasant 78 years old white female with history of iron deficiency anemia/anemia of chronic disease secondary to malabsorption and Crohn's disease. The patient require Feraheme infusion at intermittent basis because of her blood loss from the colostomy as well as the malabsorption from Crohn's disease. The patient is currently on vitamin B12 supplement and she is feeling fine. Repeat anemia panel showed no concerning findings except for mild anemia but no significant iron deficiency. I recommended for her to continue with her current supplements for now. I will see her back for follow-up visit in 6 months for evaluation and repeat anemia panel before her next visit. The patient was advised to call immediately if she has any concerning symptoms  in the interval. The patient will continue her treatment with intravenous Vedolizumab under the care of Dr. Emelda Fear at [facility]. The patient voices understanding of current disease status and treatment options and is in agreement with the current care plan. All questions were answered. The patient knows to call the clinic with any problems, questions or concerns. We can certainly see the patient much sooner if necessary.  Disclaimer: This note was dictated with voice recognition software. Similar sounding words can inadvertently be transcribed and may be missed upon review.

## 2018-07-19 NOTE — Telephone Encounter (Signed)
Printed avs and calender of upcoming appointment. Per 2/13 los

## 2018-09-11 ENCOUNTER — Ambulatory Visit: Payer: Medicare Other | Admitting: Plastic Surgery

## 2018-11-15 ENCOUNTER — Telehealth: Payer: Self-pay | Admitting: Plastic Surgery

## 2018-11-15 NOTE — Telephone Encounter (Signed)

## 2018-11-16 ENCOUNTER — Other Ambulatory Visit: Payer: Self-pay

## 2018-11-16 ENCOUNTER — Encounter: Payer: Self-pay | Admitting: Plastic Surgery

## 2018-11-16 ENCOUNTER — Ambulatory Visit (INDEPENDENT_AMBULATORY_CARE_PROVIDER_SITE_OTHER): Payer: Medicare Other | Admitting: Plastic Surgery

## 2018-11-16 VITALS — BP 130/79 | HR 60 | Temp 98.1°F | Ht <= 58 in | Wt 98.0 lb

## 2018-11-16 DIAGNOSIS — N650 Deformity of reconstructed breast: Secondary | ICD-10-CM

## 2018-11-16 DIAGNOSIS — N6489 Other specified disorders of breast: Secondary | ICD-10-CM

## 2018-11-16 MED ORDER — HYDROCODONE-ACETAMINOPHEN 5-325 MG PO TABS: 1.0000 | ORAL_TABLET | Freq: Three times a day (TID) | ORAL | 0 refills | Status: AC | PRN

## 2018-11-16 MED ORDER — CEPHALEXIN 500 MG PO CAPS: 500.0000 mg | ORAL_CAPSULE | Freq: Four times a day (QID) | ORAL | 0 refills | Status: AC

## 2018-11-16 NOTE — Addendum Note (Signed)
Addended by: Wallace Going on: 11/16/2018 12:27 PM   Modules accepted: Orders

## 2018-11-16 NOTE — Progress Notes (Signed)
DATE OF PROCEDURE: 11/16/2018 at the Office  PREOPERATIVE DIAGNOSIS: breast asymmetry  POSTOPERATIVE DIAGNOSIS: Same  PROCEDURE: Excision of right breast tissue for symmetry 3 x 6 cm  SURGEON: Nassim Cosma Sanger Laine Giovanetti, DO  ASSISTANT: Bonita Cox, RNFA  EBL: 1 cc  CONDITION: Stable  COMPLICATIONS: None  INDICATION: The patient, Kim [name], is a 78 y.o. female born on 1940/11/11, is here for treatment of breast asymmetry on the right breast.   PROCEDURE DETAILS:  The patient was seen prior to the procedure.  Risks and complications were reviewed and the patient agreed to proceed with the procedure.  The patient was taken to the procedure room. A standard time out was performed and all information was confirmed by those in the room.  The patient was marked in the standing position.  The patient was then positioned in a comfortable supine position.  Her right breast was prepped with Betadine and draped in the usual sterile fashion.  Lidocaine 1% with epinephrine was injected at the premarked area at the vertical limb.  Once the local was injected the patient was tested for feeling.  Any residual feeling and more local was injected.  Once the patient was comfortable in the #10 blade was used to make the incision at the premarked site of the vertical scar.  The Bovie was then used to excise a 3 x 6 cm area of skin and soft tissue.  Hemostasis was achieved with the electrocautery.  The deep layers were reapproximated with 3-0 Monocryl.  The 4-0 and 5-0 Monocryl was then placed and a running subcuticular closure was performed.  Steri-Strips were applied.  The patient tolerated the procedure well.  There were no complications.   The RNFA assisted throughout the case.  The RNFA was essential in retraction and counter traction when needed to make the case progress smoothly.  This retraction and assistance made it possible to see the tissue plans for the procedure.  The assistance was needed for blood control,  tissue re-approximation and assisted with closure of the incision site.

## 2018-11-28 ENCOUNTER — Encounter: Payer: Self-pay | Admitting: Plastic Surgery

## 2018-11-28 ENCOUNTER — Other Ambulatory Visit: Payer: Self-pay

## 2018-11-28 ENCOUNTER — Ambulatory Visit (INDEPENDENT_AMBULATORY_CARE_PROVIDER_SITE_OTHER): Payer: Medicare Other | Admitting: Plastic Surgery

## 2018-11-28 VITALS — BP 112/57 | HR 57 | Temp 98.3°F | Resp 16

## 2018-11-28 DIAGNOSIS — N6489 Other specified disorders of breast: Secondary | ICD-10-CM

## 2018-11-28 NOTE — Progress Notes (Signed)
The patient is a 78 year old female here for follow-up after undergoing revision of her right breast reduction.  Although the sutures are in place as well as the Steri-Strips.  There is no sign of infection.  She has a little bit of swelling as expected.  Overall she is doing very well.  Follow-up as needed.  Let the Steri-Strips fall off in the next 2 weeks.

## 2018-11-30 ENCOUNTER — Ambulatory Visit: Payer: Medicare Other | Admitting: Plastic Surgery

## 2018-12-25 ENCOUNTER — Other Ambulatory Visit: Payer: Self-pay | Admitting: Geriatric Medicine

## 2018-12-25 ENCOUNTER — Telehealth: Payer: Self-pay | Admitting: Plastic Surgery

## 2018-12-25 DIAGNOSIS — N83202 Unspecified ovarian cyst, left side: Secondary | ICD-10-CM

## 2018-12-25 NOTE — Telephone Encounter (Signed)
Patient called with questions regarding mammogram. She stated that she is due for a mammogram and would like to know if she is able to have one now since she recently underwent a procedure for breast revision on 11/16/2018. She would also like to know if she should have a screening or diagnostic mammogram.

## 2018-12-26 NOTE — Telephone Encounter (Signed)
Called and spoke with the patient regarding the message below.  Informed the patient that Dr. Marla Roe said for her to wait 1 month then get the mammogram, and it should be a screening mammogram.  Patient verbalized understanding and agreed.//AB/CMA

## 2019-01-01 ENCOUNTER — Other Ambulatory Visit: Payer: Self-pay | Admitting: Nephrology

## 2019-01-01 DIAGNOSIS — N183 Chronic kidney disease, stage 3 unspecified: Secondary | ICD-10-CM

## 2019-01-01 DIAGNOSIS — N281 Cyst of kidney, acquired: Secondary | ICD-10-CM

## 2019-01-15 ENCOUNTER — Other Ambulatory Visit: Payer: Self-pay | Admitting: *Deleted

## 2019-01-15 ENCOUNTER — Other Ambulatory Visit: Payer: Self-pay

## 2019-01-15 ENCOUNTER — Inpatient Hospital Stay: Payer: Medicare Other | Attending: Internal Medicine

## 2019-01-15 ENCOUNTER — Telehealth: Payer: Self-pay | Admitting: *Deleted

## 2019-01-15 DIAGNOSIS — R7989 Other specified abnormal findings of blood chemistry: Secondary | ICD-10-CM | POA: Insufficient documentation

## 2019-01-15 DIAGNOSIS — R5383 Other fatigue: Secondary | ICD-10-CM | POA: Diagnosis not present

## 2019-01-15 DIAGNOSIS — D508 Other iron deficiency anemias: Secondary | ICD-10-CM

## 2019-01-15 DIAGNOSIS — Z933 Colostomy status: Secondary | ICD-10-CM | POA: Insufficient documentation

## 2019-01-15 DIAGNOSIS — Z85828 Personal history of other malignant neoplasm of skin: Secondary | ICD-10-CM | POA: Insufficient documentation

## 2019-01-15 DIAGNOSIS — K912 Postsurgical malabsorption, not elsewhere classified: Secondary | ICD-10-CM | POA: Diagnosis not present

## 2019-01-15 DIAGNOSIS — M810 Age-related osteoporosis without current pathological fracture: Secondary | ICD-10-CM | POA: Insufficient documentation

## 2019-01-15 DIAGNOSIS — M1990 Unspecified osteoarthritis, unspecified site: Secondary | ICD-10-CM | POA: Diagnosis not present

## 2019-01-15 DIAGNOSIS — D509 Iron deficiency anemia, unspecified: Secondary | ICD-10-CM | POA: Diagnosis not present

## 2019-01-15 DIAGNOSIS — K5090 Crohn's disease, unspecified, without complications: Secondary | ICD-10-CM | POA: Diagnosis not present

## 2019-01-15 DIAGNOSIS — Z79899 Other long term (current) drug therapy: Secondary | ICD-10-CM | POA: Insufficient documentation

## 2019-01-15 LAB — CMP (CANCER CENTER ONLY)
ALT: 31 U/L (ref 0–44)
AST: 27 U/L (ref 15–41)
Albumin: 3.8 g/dL (ref 3.5–5.0)
Alkaline Phosphatase: 51 U/L (ref 38–126)
Anion gap: 9 (ref 5–15)
BUN: 36 mg/dL — ABNORMAL HIGH (ref 8–23)
CO2: 23 mmol/L (ref 22–32)
Calcium: 9.2 mg/dL (ref 8.9–10.3)
Chloride: 107 mmol/L (ref 98–111)
Creatinine: 1.45 mg/dL — ABNORMAL HIGH (ref 0.44–1.00)
GFR, Est AFR Am: 40 mL/min — ABNORMAL LOW (ref >60)
GFR, Estimated: 35 mL/min — ABNORMAL LOW (ref >60)
Glucose, Bld: 111 mg/dL — ABNORMAL HIGH (ref 70–99)
Potassium: 3.6 mmol/L (ref 3.5–5.1)
Sodium: 139 mmol/L (ref 135–145)
Total Bilirubin: 0.5 mg/dL (ref 0.3–1.2)
Total Protein: 7.4 g/dL (ref 6.5–8.1)

## 2019-01-15 LAB — CBC WITH DIFFERENTIAL (CANCER CENTER ONLY)
Abs Immature Granulocytes: 0.01 10*3/uL (ref 0.00–0.07)
Basophils Absolute: 0 10*3/uL (ref 0.0–0.1)
Basophils Relative: 0 %
Eosinophils Absolute: 0.1 10*3/uL (ref 0.0–0.5)
Eosinophils Relative: 2 %
HCT: 33.4 % — ABNORMAL LOW (ref 36.0–46.0)
Hemoglobin: 10.8 g/dL — ABNORMAL LOW (ref 12.0–15.0)
Immature Granulocytes: 0 %
Lymphocytes Relative: 16 %
Lymphs Abs: 1 10*3/uL (ref 0.7–4.0)
MCH: 32.5 pg (ref 26.0–34.0)
MCHC: 32.3 g/dL (ref 30.0–36.0)
MCV: 100.6 fL — ABNORMAL HIGH (ref 80.0–100.0)
Monocytes Absolute: 0.4 10*3/uL (ref 0.1–1.0)
Monocytes Relative: 7 %
Neutro Abs: 4.6 10*3/uL (ref 1.7–7.7)
Neutrophils Relative %: 75 %
Platelet Count: UNDETERMINED 10*3/uL (ref 150–400)
RBC: 3.32 MIL/uL — ABNORMAL LOW (ref 3.87–5.11)
RDW: 13.1 % (ref 11.5–15.5)
WBC Count: 6.2 10*3/uL (ref 4.0–10.5)
nRBC: 0 % (ref 0.0–0.2)

## 2019-01-15 LAB — LACTATE DEHYDROGENASE: LDH: 160 U/L (ref 98–192)

## 2019-01-15 LAB — IRON AND TIBC
Iron: 92 ug/dL (ref 41–142)
Saturation Ratios: 36 % (ref 21–57)
TIBC: 259 ug/dL (ref 236–444)
UIBC: 167 ug/dL (ref 120–384)

## 2019-01-15 LAB — FOLATE: Folate: 51.8 ng/mL (ref >5.9)

## 2019-01-15 LAB — VITAMIN B12: Vitamin B-12: 753 pg/mL (ref 180–914)

## 2019-01-15 LAB — FERRITIN: Ferritin: 967 ng/mL — ABNORMAL HIGH (ref 11–307)

## 2019-01-15 NOTE — Telephone Encounter (Signed)
Received call from lab that pt's platelet count was not able to be done accurately d/t clumping and they could not release the platelet count for today. Pt needs to have this re-done and platelet count by citrate.  Will have pt come in prior to her appt with Dr. Julien Nordmann on 01/17/19 for repeat labs.  TCT patient and made her aware of repeat lab need.  She voiced understanding.

## 2019-01-17 ENCOUNTER — Telehealth: Payer: Self-pay | Admitting: *Deleted

## 2019-01-17 ENCOUNTER — Encounter: Payer: Self-pay | Admitting: Internal Medicine

## 2019-01-17 ENCOUNTER — Inpatient Hospital Stay (HOSPITAL_BASED_OUTPATIENT_CLINIC_OR_DEPARTMENT_OTHER): Payer: Medicare Other | Admitting: Internal Medicine

## 2019-01-17 ENCOUNTER — Telehealth: Payer: Self-pay | Admitting: Internal Medicine

## 2019-01-17 ENCOUNTER — Other Ambulatory Visit: Payer: Self-pay

## 2019-01-17 ENCOUNTER — Inpatient Hospital Stay: Payer: Medicare Other

## 2019-01-17 VITALS — BP 133/68 | HR 66 | Temp 98.5°F | Resp 18 | Ht <= 58 in | Wt 93.7 lb

## 2019-01-17 DIAGNOSIS — K50813 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: Secondary | ICD-10-CM | POA: Diagnosis not present

## 2019-01-17 DIAGNOSIS — D509 Iron deficiency anemia, unspecified: Secondary | ICD-10-CM | POA: Diagnosis not present

## 2019-01-17 DIAGNOSIS — D638 Anemia in other chronic diseases classified elsewhere: Secondary | ICD-10-CM | POA: Diagnosis not present

## 2019-01-17 DIAGNOSIS — D508 Other iron deficiency anemias: Secondary | ICD-10-CM

## 2019-01-17 LAB — CBC WITH DIFFERENTIAL (CANCER CENTER ONLY)
Abs Immature Granulocytes: 0.01 10*3/uL (ref 0.00–0.07)
Basophils Absolute: 0 10*3/uL (ref 0.0–0.1)
Basophils Relative: 0 %
Eosinophils Absolute: 0.1 10*3/uL (ref 0.0–0.5)
Eosinophils Relative: 1 %
HCT: 33.7 % — ABNORMAL LOW (ref 36.0–46.0)
Hemoglobin: 11 g/dL — ABNORMAL LOW (ref 12.0–15.0)
Immature Granulocytes: 0 %
Lymphocytes Relative: 16 %
Lymphs Abs: 1 10*3/uL (ref 0.7–4.0)
MCH: 33 pg (ref 26.0–34.0)
MCHC: 32.6 g/dL (ref 30.0–36.0)
MCV: 101.2 fL — ABNORMAL HIGH (ref 80.0–100.0)
Monocytes Absolute: 0.4 10*3/uL (ref 0.1–1.0)
Monocytes Relative: 7 %
Neutro Abs: 5 10*3/uL (ref 1.7–7.7)
Neutrophils Relative %: 76 %
Platelet Count: UNDETERMINED 10*3/uL (ref 150–400)
RBC: 3.33 MIL/uL — ABNORMAL LOW (ref 3.87–5.11)
RDW: 13.1 % (ref 11.5–15.5)
WBC Count: 6.6 10*3/uL (ref 4.0–10.5)
nRBC: 0 % (ref 0.0–0.2)

## 2019-01-17 LAB — PLATELET BY CITRATE

## 2019-01-17 NOTE — Progress Notes (Signed)
[place] Telephone:(336) [phone]   Fax:(336) [phone]  OFFICE PROGRESS NOTE  Lajean Manes, MD 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   PRIOR THERAPY:  1) PRBCs transfusion as needed last was given few days ago.   CURRENT THERAPY:  1) Intravenous Feraheme infusion on as-needed basis. Last dose was given on last infusion was on 09/18/2015 2) vitamin B12 injection on monthly basis.  INTERVAL HISTORY: Kim [name] 78 y.o. female returns to the clinic today for 6 months follow-up visit.  The patient is feeling fine today with no concerning complaints except for mild fatigue.  She denied having any nausea, vomiting, diarrhea or constipation.  She has no chest pain, shortness of breath, cough or hemoptysis.  She has no recent weight loss or night sweats.  The patient had repeat anemia panel performed recently and she is here for evaluation and discussion of her lab results.  MEDICAL HISTORY: Past Medical History:  Diagnosis Date  . Anemia    related to malabsorption--gets B12 shots and iron infusions, managed by Dr. Julien Nordmann  . BCC (basal cell carcinoma of skin)   . Crohn disease ([place])    Dr. Emelda Fear at [facility]  . Glaucoma    Dr. Janyth Contes  . Intestinal malabsorption   . OA (osteoarthritis)    hands,back  . Osteoporosis    managed by Dr. Nevada Crane at [facility], [place] q2 yrs    ALLERGIES:  is allergic to antihistamines, chlorpheniramine-type; chlorpheniramine; metronidazole; other; sulfa antibiotics; and tape.  MEDICATIONS:  Current Outpatient Medications  Medication Sig Dispense Refill  . acetaminophen (TYLENOL) 500 MG tablet Take 500 mg by mouth 3 (three) times daily.     . brimonidine (ALPHAGAN) 0.2 % ophthalmic solution Per pt takes  Mid Day  1  . CALCIUM CITRATE PO Take by mouth.    . cholecalciferol (VITAMIN D) 1000 UNITS tablet Take  2,000 Units by mouth daily.     . clobetasol cream (TEMOVATE) 0.05 % Apply topically.    . cyanocobalamin (,VITAMIN B-12,) 1000 MCG/ML injection INJECT 1 MILLILITER INTO MUSCLE EVERY 30 DAYS 3 mL 2  . dorzolamide-timolol (COSOPT) 22.3-6.8 MG/ML ophthalmic solution Place 1 drop into both eyes every 12 (twelve) hours. Per pt take Mid day    . gabapentin (NEURONTIN) 300 MG capsule Take 300 mg by mouth 3 (three) times daily.     Marland Kitchen latanoprost (XALATAN) 0.005 % ophthalmic solution Instill 1 drop into both eyes mid day    . Melatonin 10 MG TBCR Take 10 mg by mouth at bedtime as needed for sleep.    . Multiple Vitamins-Minerals (ONE-A-DAY WOMENS 50+ ADVANTAGE) TABS Take 1 tablet by mouth daily.    . risedronate (ACTONEL) 35 MG tablet TAKE 1 TABLET BY MOUTH EVERY 7 DAYS WITH A FULL GLASS OF WATER. DO NOT LIE DOWN FOR THE NEXT 30 MIN.  3   No current facility-administered medications for this visit.    Facility-Administered Medications Ordered in Other Visits  Medication Dose Route Frequency Provider Last Rate Last Dose  . acetaminophen (TYLENOL) tablet 650 mg  650 mg Oral Once Carlton Adam, PA-C        SURGICAL HISTORY:  Past Surgical History:  Procedure Laterality Date  . BREAST EXCISIONAL BIOPSY Right   . Crohn  2009  . internal ostomy pouch  1994  . INTRAMEDULLARY (IM) NAIL INTERTROCHANTERIC Left  03/05/2016   Procedure: INTRAMEDULLARY (IM) NAIL INTERTROCHANTRIC;  Surgeon: Nicholes Stairs, MD;  Location: [place];  Service: Orthopedics;  Laterality: Left;  . REDUCTION MAMMAPLASTY Bilateral 01/2017  . TOTAL COLECTOMY  1984   due to Crohn's    REVIEW OF SYSTEMS:  A comprehensive review of systems was negative except for: Constitutional: positive for fatigue   PHYSICAL EXAMINATION: General appearance: alert, cooperative, fatigued and no distress Head: Normocephalic, without obvious abnormality, atraumatic Neck: no adenopathy, no JVD, supple, symmetrical, trachea midline and thyroid not  enlarged, symmetric, no tenderness/mass/nodules Lymph nodes: Cervical, supraclavicular, and axillary nodes normal. Resp: clear to auscultation bilaterally Back: symmetric, no curvature. ROM normal. No CVA tenderness. Cardio: regular rate and rhythm, S1, S2 normal, no murmur, click, rub or gallop GI: soft, non-tender; bowel sounds normal; no masses,  no organomegaly Extremities: extremities normal, atraumatic, no cyanosis or edema  ECOG PERFORMANCE STATUS: 1 - Symptomatic but completely ambulatory  Blood pressure 133/68, pulse 66, temperature 98.5 F (36.9 C), temperature source Oral, resp. rate 18, height 4' 8"  (1.422 m), weight 93 lb 11.2 oz (42.5 kg), SpO2 100 %.  LABORATORY DATA: Lab Results  Component Value Date   WBC 6.2 01/15/2019   HGB 10.8 (L) 01/15/2019   HCT 33.4 (L) 01/15/2019   MCV 100.6 (H) 01/15/2019   PLT PLT CLUMPED ON SMEAR UNABLE TO ESTIMATE 01/15/2019      Chemistry      Component Value Date/Time   NA 139 01/15/2019 1250   NA 140 11/17/2016 1134   K 3.6 01/15/2019 1250   K 3.9 11/17/2016 1134   CL 107 01/15/2019 1250   CO2 23 01/15/2019 1250   CO2 22 11/17/2016 1134   BUN 36 (H) 01/15/2019 1250   BUN 29.7 (H) 11/17/2016 1134   CREATININE 1.45 (H) 01/15/2019 1250   CREATININE 1.4 (H) 11/17/2016 1134      Component Value Date/Time   CALCIUM 9.2 01/15/2019 1250   CALCIUM 10.0 11/17/2016 1134   ALKPHOS 51 01/15/2019 1250   ALKPHOS 74 11/17/2016 1134   AST 27 01/15/2019 1250   AST 21 11/17/2016 1134   ALT 31 01/15/2019 1250   ALT 19 11/17/2016 1134   BILITOT 0.5 01/15/2019 1250   BILITOT 0.36 11/17/2016 1134     Other lab results: Ferritin 961, serum iron 109, total iron binding capacity 271 and iron saturation 40 %.   RADIOGRAPHIC STUDIES: No results found.   ASSESSMENT AND PLAN:  This is a very pleasant 78 years old white female with history of iron deficiency anemia/anemia of chronic disease secondary to malabsorption and Crohn's disease. The  patient require Feraheme infusion at intermittent basis because of her blood loss from the colostomy as well as the malabsorption from Crohn's disease. The patient is currently on vitamin B12 supplement and she is feeling fine. She had repeat myeloma panel performed recently including iron study, ferritin, B12 level as well as serum folate in addition to CBC and comprehensive metabolic panel and LDH. Her myeloma panel is unremarkable except for mild anemia.  She continues to have elevated ferritin secondary to chronic inflammatory process.  Platelets were clumped even after repeat with citrate today.  We can repeat her platelet count with fingerstick and blood smear or follow-up in 6 months as previously planned since the patient is asymptomatic and has no bleeding, bruises or ecchymosis. I discussed the lab results with the patient and recommended for her to continue on observation for now.  I will see her  back for follow-up visit in 6 months with repeat myeloma panel. The patient will continue her treatment with intravenous Vedolizumab under the care of Dr. Emelda [facility] at [facility]. She was advised to call immediately if she has any concerning symptoms in the interval. The patient voices understanding of current disease status and treatment options and is in agreement with the current care plan. All questions were answered. The patient knows to call the clinic with any problems, questions or concerns. We can certainly see the patient much sooner if necessary.  Disclaimer: This note was dictated with voice recognition software. Similar sounding words can inadvertently be transcribed and may be missed upon review.

## 2019-01-17 NOTE — Telephone Encounter (Signed)
Attempted to call patient regarding lab results-platelet count was too clumped to count even in citrate tube. Spoke with patient's husband and asked him to have her call back to the cancer center regarding results. He stated he would tell her.  To get an accurate platelet count now, pt would need to return for finger stick cbc. Pt can choose to do that. Will discuss when she calls back.

## 2019-01-17 NOTE — Telephone Encounter (Signed)
Scheduled per 08/13 los, patient received calender and avs.

## 2019-01-18 ENCOUNTER — Telehealth: Payer: Self-pay | Admitting: *Deleted

## 2019-01-18 NOTE — Telephone Encounter (Signed)
Received vm message from patient. She is returning call from yesterday. TCT patient. Reviewed labs with her and advised that the lab was unable to get an accurate platelet count again even with Citrate tube. Advised pt that Dr. Julien Nordmann suggested a fingerstick CBC to get platelet count. Pt does not want to come back in as it is difficult for her to get here and time consuming. She prefers to wait until her next visit in February 2021. Note made on lab appt for cbc via finger stick for platelet count.

## 2019-01-21 ENCOUNTER — Other Ambulatory Visit: Payer: Medicare Other

## 2019-01-24 ENCOUNTER — Ambulatory Visit
Admission: RE | Admit: 2019-01-24 | Discharge: 2019-01-24 | Disposition: A | Discharge status: Home / Self Care | Payer: Medicare Other | Source: Ambulatory Visit | Attending: Nephrology | Admitting: Nephrology

## 2019-01-24 ENCOUNTER — Ambulatory Visit
Admission: RE | Admit: 2019-01-24 | Discharge: 2019-01-24 | Disposition: A | Discharge status: Home / Self Care | Payer: Medicare Other | Source: Ambulatory Visit | Attending: Geriatric Medicine | Admitting: Geriatric Medicine

## 2019-01-24 DIAGNOSIS — N83202 Unspecified ovarian cyst, left side: Secondary | ICD-10-CM

## 2019-01-24 DIAGNOSIS — N183 Chronic kidney disease, stage 3 unspecified: Secondary | ICD-10-CM

## 2019-01-24 DIAGNOSIS — N281 Cyst of kidney, acquired: Secondary | ICD-10-CM

## 2019-02-01 DIAGNOSIS — N183 Chronic kidney disease, stage 3 unspecified: Secondary | ICD-10-CM | POA: Insufficient documentation

## 2019-02-01 DIAGNOSIS — N184 Chronic kidney disease, stage 4 (severe): Secondary | ICD-10-CM | POA: Insufficient documentation

## 2019-02-15 ENCOUNTER — Other Ambulatory Visit: Payer: Self-pay | Admitting: Geriatric Medicine

## 2019-02-15 DIAGNOSIS — Z1231 Encounter for screening mammogram for malignant neoplasm of breast: Secondary | ICD-10-CM

## 2019-04-03 ENCOUNTER — Ambulatory Visit
Admission: RE | Admit: 2019-04-03 | Discharge: 2019-04-03 | Disposition: A | Discharge status: Home / Self Care | Payer: Medicare Other | Source: Ambulatory Visit | Attending: Geriatric Medicine | Admitting: Geriatric Medicine

## 2019-04-03 ENCOUNTER — Other Ambulatory Visit: Payer: Self-pay

## 2019-04-03 DIAGNOSIS — Z1231 Encounter for screening mammogram for malignant neoplasm of breast: Secondary | ICD-10-CM

## 2019-07-09 ENCOUNTER — Telehealth: Payer: Self-pay | Admitting: Internal Medicine

## 2019-07-09 NOTE — Telephone Encounter (Signed)
Returned patient's phone call regarding rescheduling an appointment, left a voicemail. 

## 2019-07-10 ENCOUNTER — Telehealth: Payer: Self-pay | Admitting: Internal Medicine

## 2019-07-10 NOTE — Telephone Encounter (Signed)
Returned patient's phone call regarding rescheduling an appointment, per patient's request appointment has moved to 02/09.

## 2019-07-16 ENCOUNTER — Other Ambulatory Visit: Payer: Self-pay

## 2019-07-16 ENCOUNTER — Inpatient Hospital Stay: Payer: Medicare PPO | Attending: Internal Medicine

## 2019-07-16 DIAGNOSIS — Z85828 Personal history of other malignant neoplasm of skin: Secondary | ICD-10-CM | POA: Diagnosis not present

## 2019-07-16 DIAGNOSIS — Z79899 Other long term (current) drug therapy: Secondary | ICD-10-CM | POA: Diagnosis not present

## 2019-07-16 DIAGNOSIS — M1990 Unspecified osteoarthritis, unspecified site: Secondary | ICD-10-CM | POA: Insufficient documentation

## 2019-07-16 DIAGNOSIS — D509 Iron deficiency anemia, unspecified: Secondary | ICD-10-CM | POA: Diagnosis not present

## 2019-07-16 DIAGNOSIS — K9089 Other intestinal malabsorption: Secondary | ICD-10-CM | POA: Insufficient documentation

## 2019-07-16 DIAGNOSIS — K5090 Crohn's disease, unspecified, without complications: Secondary | ICD-10-CM | POA: Insufficient documentation

## 2019-07-16 DIAGNOSIS — M810 Age-related osteoporosis without current pathological fracture: Secondary | ICD-10-CM | POA: Insufficient documentation

## 2019-07-16 DIAGNOSIS — D638 Anemia in other chronic diseases classified elsewhere: Secondary | ICD-10-CM

## 2019-07-16 LAB — CMP (CANCER CENTER ONLY)
ALT: 30 U/L (ref 0–44)
AST: 26 U/L (ref 15–41)
Albumin: 3.4 g/dL — ABNORMAL LOW (ref 3.5–5.0)
Alkaline Phosphatase: 54 U/L (ref 38–126)
Anion gap: 8 (ref 5–15)
BUN: 38 mg/dL — ABNORMAL HIGH (ref 8–23)
CO2: 22 mmol/L (ref 22–32)
Calcium: 8.8 mg/dL — ABNORMAL LOW (ref 8.9–10.3)
Chloride: 110 mmol/L (ref 98–111)
Creatinine: 1.72 mg/dL — ABNORMAL HIGH (ref 0.44–1.00)
GFR, Est AFR Am: 33 mL/min — ABNORMAL LOW (ref >60)
GFR, Estimated: 28 mL/min — ABNORMAL LOW (ref >60)
Glucose, Bld: 87 mg/dL (ref 70–99)
Potassium: 3.6 mmol/L (ref 3.5–5.1)
Sodium: 140 mmol/L (ref 135–145)
Total Bilirubin: 0.3 mg/dL (ref 0.3–1.2)
Total Protein: 7 g/dL (ref 6.5–8.1)

## 2019-07-16 LAB — CBC WITH DIFFERENTIAL (CANCER CENTER ONLY)
Abs Immature Granulocytes: 0.02 10*3/uL (ref 0.00–0.07)
Basophils Absolute: 0 10*3/uL (ref 0.0–0.1)
Basophils Relative: 0 %
Eosinophils Absolute: 0 10*3/uL (ref 0.0–0.5)
Eosinophils Relative: 1 %
HCT: 33.5 % — ABNORMAL LOW (ref 36.0–46.0)
Hemoglobin: 10.8 g/dL — ABNORMAL LOW (ref 12.0–15.0)
Immature Granulocytes: 0 %
Lymphocytes Relative: 15 %
Lymphs Abs: 0.8 10*3/uL (ref 0.7–4.0)
MCH: 33.1 pg (ref 26.0–34.0)
MCHC: 32.2 g/dL (ref 30.0–36.0)
MCV: 102.8 fL — ABNORMAL HIGH (ref 80.0–100.0)
Monocytes Absolute: 0.3 10*3/uL (ref 0.1–1.0)
Monocytes Relative: 6 %
Neutro Abs: 4.2 10*3/uL (ref 1.7–7.7)
Neutrophils Relative %: 78 %
Platelet Count: UNDETERMINED 10*3/uL (ref 150–400)
RBC: 3.26 MIL/uL — ABNORMAL LOW (ref 3.87–5.11)
RDW: 13.2 % (ref 11.5–15.5)
WBC Count: 5.5 10*3/uL (ref 4.0–10.5)
nRBC: 0 % (ref 0.0–0.2)

## 2019-07-16 LAB — IRON AND TIBC
Iron: 85 ug/dL (ref 41–142)
Saturation Ratios: 39 % (ref 21–57)
TIBC: 219 ug/dL — ABNORMAL LOW (ref 236–444)
UIBC: 134 ug/dL (ref 120–384)

## 2019-07-16 LAB — LACTATE DEHYDROGENASE: LDH: 151 U/L (ref 98–192)

## 2019-07-16 LAB — VITAMIN B12: Vitamin B-12: 1468 pg/mL — ABNORMAL HIGH (ref 180–914)

## 2019-07-16 LAB — FERRITIN: Ferritin: 1320 ng/mL — ABNORMAL HIGH (ref 11–307)

## 2019-07-16 LAB — FOLATE: Folate: 51.5 ng/mL (ref >5.9)

## 2019-07-17 ENCOUNTER — Other Ambulatory Visit: Payer: Medicare Other

## 2019-07-22 ENCOUNTER — Encounter: Payer: Self-pay | Admitting: Internal Medicine

## 2019-07-22 ENCOUNTER — Inpatient Hospital Stay (HOSPITAL_BASED_OUTPATIENT_CLINIC_OR_DEPARTMENT_OTHER): Payer: Medicare PPO | Admitting: Internal Medicine

## 2019-07-22 ENCOUNTER — Other Ambulatory Visit: Payer: Self-pay

## 2019-07-22 VITALS — BP 117/55 | HR 65 | Temp 98.2°F | Resp 18 | Ht <= 58 in | Wt 79.0 lb

## 2019-07-22 DIAGNOSIS — K909 Intestinal malabsorption, unspecified: Secondary | ICD-10-CM | POA: Diagnosis not present

## 2019-07-22 DIAGNOSIS — D508 Other iron deficiency anemias: Secondary | ICD-10-CM

## 2019-07-22 DIAGNOSIS — D638 Anemia in other chronic diseases classified elsewhere: Secondary | ICD-10-CM

## 2019-07-22 DIAGNOSIS — K5090 Crohn's disease, unspecified, without complications: Secondary | ICD-10-CM

## 2019-07-22 DIAGNOSIS — Z9049 Acquired absence of other specified parts of digestive tract: Secondary | ICD-10-CM

## 2019-07-22 DIAGNOSIS — Z933 Colostomy status: Secondary | ICD-10-CM

## 2019-07-22 DIAGNOSIS — N289 Disorder of kidney and ureter, unspecified: Secondary | ICD-10-CM

## 2019-07-22 DIAGNOSIS — E43 Unspecified severe protein-calorie malnutrition: Secondary | ICD-10-CM

## 2019-07-22 DIAGNOSIS — D509 Iron deficiency anemia, unspecified: Secondary | ICD-10-CM | POA: Diagnosis not present

## 2019-07-22 NOTE — Progress Notes (Signed)
[place] [phone]   [phone]  OFFICE PROGRESS NOTE  Lajean Manes, MD 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   PRIOR THERAPY:  1) PRBCs transfusion as needed last was given few days ago.   CURRENT THERAPY:  1) Intravenous Feraheme infusion on as-needed basis. Last dose was given on last infusion was on 09/18/2015 2) vitamin B12 injection on monthly basis.  INTERVAL HISTORY: Kim [name] 79 y.o. female returns to the clinic today for 6 months follow-up visit.  The patient is feeling fine today with no concerning complaints.  She denied having any fatigue or weakness.  She denied having any fever or chills.  She has no nausea, vomiting, diarrhea or constipation.  She has no bleeding from the ostomy.  The patient lost few pounds since her last visit and she mentions that she eats good.  She also has renal insufficiency and followed by a nephrologist at [facility].  The patient is here today for evaluation with repeat CBC, comprehensive metabolic panel, LDH as well as iron study, vitamin B-12 and serum folate.  MEDICAL HISTORY: Past Medical History:  Diagnosis Date  . Anemia    related to malabsorption--gets B12 shots and iron infusions, managed by Dr. Julien Nordmann  . BCC (basal cell carcinoma of skin)   . Crohn disease ([place])    Dr. Emelda Fear at [facility]  . Glaucoma    Dr. Janyth Contes  . Intestinal malabsorption   . OA (osteoarthritis)    hands,back  . Osteoporosis    managed by Dr. Nevada Crane at [facility], [place] q2 yrs    ALLERGIES:  is allergic to antihistamines, chlorpheniramine-type; chlorpheniramine; metronidazole; other; sulfa antibiotics; and tape.  MEDICATIONS:  Current Outpatient Medications  Medication Sig Dispense Refill  . acetaminophen (TYLENOL) 500 MG tablet Take 500 mg by mouth 3 (three) times daily.      . brimonidine (ALPHAGAN) 0.2 % ophthalmic solution Per pt takes  Mid Day  1  . CALCIUM CITRATE PO Take by mouth.    . cholecalciferol (VITAMIN D) 1000 UNITS tablet Take 2,000 Units by mouth daily.     . clobetasol cream (TEMOVATE) 0.05 % Apply topically.    . cyanocobalamin (,VITAMIN B-12,) 1000 MCG/ML injection INJECT 1 MILLILITER INTO MUSCLE EVERY 30 DAYS 3 mL 2  . dorzolamide-timolol (COSOPT) 22.3-6.8 MG/ML ophthalmic solution Place 1 drop into both eyes every 12 (twelve) hours. Per pt take Mid day    . gabapentin (NEURONTIN) 300 MG capsule Take 300 mg by mouth 3 (three) times daily.     Marland Kitchen latanoprost (XALATAN) 0.005 % ophthalmic solution Instill 1 drop into both eyes mid day    . Melatonin 10 MG TBCR Take 10 mg by mouth at bedtime as needed for sleep.    . Multiple Vitamins-Minerals (ONE-A-DAY WOMENS 50+ ADVANTAGE) TABS Take 1 tablet by mouth daily.    . risedronate (ACTONEL) 35 MG tablet TAKE 1 TABLET BY MOUTH EVERY 7 DAYS WITH A FULL GLASS OF WATER. DO NOT LIE DOWN FOR THE NEXT 30 MIN.  3   No current facility-administered medications for this visit.   Facility-Administered Medications Ordered in Other Visits  Medication Dose Route Frequency Provider Last Rate Last Admin  . acetaminophen (TYLENOL) tablet 650 mg  650 mg Oral Once Carlton Adam, PA-C        SURGICAL HISTORY:  Past Surgical  History:  Procedure Laterality Date  . BREAST EXCISIONAL BIOPSY Right   . Crohn  2009  . internal ostomy pouch  1994  . INTRAMEDULLARY (IM) NAIL INTERTROCHANTERIC Left 03/05/2016   Procedure: INTRAMEDULLARY (IM) NAIL INTERTROCHANTRIC;  Surgeon: Nicholes Stairs, MD;  Location: [place];  Service: Orthopedics;  Laterality: Left;  . REDUCTION MAMMAPLASTY Bilateral 01/2017  . TOTAL COLECTOMY  1984   due to Crohn's    REVIEW OF SYSTEMS:  A comprehensive review of systems was negative except for: Constitutional: positive for weight loss   PHYSICAL EXAMINATION: General appearance: alert,  cooperative and no distress Head: Normocephalic, without obvious abnormality, atraumatic Neck: no adenopathy, no JVD, supple, symmetrical, trachea midline and thyroid not enlarged, symmetric, no tenderness/mass/nodules Lymph nodes: Cervical, supraclavicular, and axillary nodes normal. Resp: clear to auscultation bilaterally Back: symmetric, no curvature. ROM normal. No CVA tenderness. Cardio: regular rate and rhythm, S1, S2 normal, no murmur, click, rub or gallop GI: soft, non-tender; bowel sounds normal; no masses,  no organomegaly Extremities: extremities normal, atraumatic, no cyanosis or edema  ECOG PERFORMANCE STATUS: 1 - Symptomatic but completely ambulatory  Blood pressure (!) 117/55, pulse 65, temperature 98.2 F (36.8 C), temperature source Temporal, resp. rate 18, height 4' 8"  (1.422 m), weight 79 lb (35.8 kg), SpO2 100 %.  LABORATORY DATA: Lab Results  Component Value Date   WBC 5.5 07/16/2019   HGB 10.8 (L) 07/16/2019   HCT 33.5 (L) 07/16/2019   MCV 102.8 (H) 07/16/2019   PLT PLATELET CLUMPS NOTED ON SMEAR, UNABLE TO ESTIMATE 07/16/2019      Chemistry      Component Value Date/Time   NA 140 07/16/2019 1212   NA 140 11/17/2016 1134   K 3.6 07/16/2019 1212   K 3.9 11/17/2016 1134   CL 110 07/16/2019 1212   CO2 22 07/16/2019 1212   CO2 22 11/17/2016 1134   BUN 38 (H) 07/16/2019 1212   BUN 29.7 (H) 11/17/2016 1134   CREATININE 1.72 (H) 07/16/2019 1212   CREATININE 1.4 (H) 11/17/2016 1134      Component Value Date/Time   CALCIUM 8.8 (L) 07/16/2019 1212   CALCIUM 10.0 11/17/2016 1134   ALKPHOS 54 07/16/2019 1212   ALKPHOS 74 11/17/2016 1134   AST 26 07/16/2019 1212   AST 21 11/17/2016 1134   ALT 30 07/16/2019 1212   ALT 19 11/17/2016 1134   BILITOT 0.3 07/16/2019 1212   BILITOT 0.36 11/17/2016 1134     Other lab results: Ferritin 1320, serum iron 85, total iron binding capacity 219 and iron saturation 39 %.   RADIOGRAPHIC STUDIES: No results found.    ASSESSMENT AND PLAN:  This is a very pleasant 79 years old white female with history of iron deficiency anemia/anemia of chronic disease secondary to malabsorption and Crohn's disease. The patient require Feraheme infusion at intermittent basis because of her blood loss from the colostomy as well as the malabsorption from Crohn's disease. The patient is currently on vitamin B12 supplement and she is feeling fine. The patient continues to do well with no recent requirement for iron infusion. Repeat CBC showed stable mild anemia.  Iron study and ferritin were unremarkable except for the hyper ferritin anemia from reactive condition. I recommended for the patient to continue on observation for now with repeat CBC, comprehensive metabolic panel, iron study, serum folate and vitamin B12 level in 6 months. We will consider the patient for iron infusion if she has any evidence of iron deficiency in the future.  For the renal insufficiency she will follow with her nephrologist at [facility]. For the history of Crohn's disease, the patient will continue her treatment with intravenous Vedolizumab under the care of Dr. Emelda Fear at [facility]. She was advised to call immediately if she has any concerning symptoms in the interval. The patient voices understanding of current disease status and treatment options and is in agreement with the current care plan. All questions were answered. The patient knows to call the clinic with any problems, questions or concerns. We can certainly see the patient much sooner if necessary.  Disclaimer: This note was dictated with voice recognition software. Similar sounding words can inadvertently be transcribed and may be missed upon review.

## 2019-07-24 ENCOUNTER — Telehealth: Payer: Self-pay | Admitting: Internal Medicine

## 2019-07-24 NOTE — Telephone Encounter (Signed)
Scheduled per los. Called and left msg. mailed pirntout

## 2019-07-26 ENCOUNTER — Other Ambulatory Visit: Payer: Self-pay | Admitting: Geriatric Medicine

## 2019-07-26 DIAGNOSIS — N83209 Unspecified ovarian cyst, unspecified side: Secondary | ICD-10-CM

## 2019-07-31 ENCOUNTER — Other Ambulatory Visit: Payer: Medicare PPO

## 2019-08-05 DIAGNOSIS — Z932 Ileostomy status: Secondary | ICD-10-CM | POA: Diagnosis not present

## 2019-08-07 ENCOUNTER — Ambulatory Visit
Admission: RE | Admit: 2019-08-07 | Discharge: 2019-08-07 | Disposition: A | Discharge status: Home / Self Care | Payer: Medicare PPO | Source: Ambulatory Visit | Attending: Geriatric Medicine | Admitting: Geriatric Medicine

## 2019-08-07 DIAGNOSIS — N83292 Other ovarian cyst, left side: Secondary | ICD-10-CM | POA: Diagnosis not present

## 2019-08-07 DIAGNOSIS — N83291 Other ovarian cyst, right side: Secondary | ICD-10-CM | POA: Diagnosis not present

## 2019-08-07 DIAGNOSIS — N83209 Unspecified ovarian cyst, unspecified side: Secondary | ICD-10-CM

## 2019-09-10 DIAGNOSIS — Z933 Colostomy status: Secondary | ICD-10-CM | POA: Diagnosis not present

## 2019-09-10 DIAGNOSIS — Z932 Ileostomy status: Secondary | ICD-10-CM | POA: Diagnosis not present

## 2019-09-10 DIAGNOSIS — K50011 Crohn's disease of small intestine with rectal bleeding: Secondary | ICD-10-CM | POA: Diagnosis not present

## 2019-09-10 DIAGNOSIS — C189 Malignant neoplasm of colon, unspecified: Secondary | ICD-10-CM | POA: Diagnosis not present

## 2019-09-20 DIAGNOSIS — N83202 Unspecified ovarian cyst, left side: Secondary | ICD-10-CM | POA: Diagnosis not present

## 2019-09-20 DIAGNOSIS — K50118 Crohn's disease of large intestine with other complication: Secondary | ICD-10-CM | POA: Diagnosis not present

## 2019-09-20 DIAGNOSIS — N83201 Unspecified ovarian cyst, right side: Secondary | ICD-10-CM | POA: Diagnosis not present

## 2019-10-02 DIAGNOSIS — H401133 Primary open-angle glaucoma, bilateral, severe stage: Secondary | ICD-10-CM | POA: Diagnosis not present

## 2019-10-09 DIAGNOSIS — Z932 Ileostomy status: Secondary | ICD-10-CM | POA: Diagnosis not present

## 2019-10-09 DIAGNOSIS — C189 Malignant neoplasm of colon, unspecified: Secondary | ICD-10-CM | POA: Diagnosis not present

## 2019-10-09 DIAGNOSIS — Z933 Colostomy status: Secondary | ICD-10-CM | POA: Diagnosis not present

## 2019-10-09 DIAGNOSIS — K50011 Crohn's disease of small intestine with rectal bleeding: Secondary | ICD-10-CM | POA: Diagnosis not present

## 2019-10-30 DIAGNOSIS — H401133 Primary open-angle glaucoma, bilateral, severe stage: Secondary | ICD-10-CM | POA: Diagnosis not present

## 2019-11-01 DIAGNOSIS — N83202 Unspecified ovarian cyst, left side: Secondary | ICD-10-CM | POA: Diagnosis not present

## 2019-11-01 DIAGNOSIS — N83201 Unspecified ovarian cyst, right side: Secondary | ICD-10-CM | POA: Diagnosis not present

## 2019-11-11 ENCOUNTER — Other Ambulatory Visit: Payer: Self-pay | Admitting: Internal Medicine

## 2019-11-11 DIAGNOSIS — K50818 Crohn's disease of both small and large intestine with other complication: Secondary | ICD-10-CM

## 2019-11-11 DIAGNOSIS — N1832 Chronic kidney disease, stage 3b: Secondary | ICD-10-CM

## 2019-11-13 DIAGNOSIS — Z933 Colostomy status: Secondary | ICD-10-CM | POA: Diagnosis not present

## 2019-11-13 DIAGNOSIS — C189 Malignant neoplasm of colon, unspecified: Secondary | ICD-10-CM | POA: Diagnosis not present

## 2019-11-13 DIAGNOSIS — K50011 Crohn's disease of small intestine with rectal bleeding: Secondary | ICD-10-CM | POA: Diagnosis not present

## 2019-11-13 DIAGNOSIS — Z932 Ileostomy status: Secondary | ICD-10-CM | POA: Diagnosis not present

## 2019-11-14 DIAGNOSIS — Z932 Ileostomy status: Secondary | ICD-10-CM | POA: Diagnosis not present

## 2019-11-14 DIAGNOSIS — C189 Malignant neoplasm of colon, unspecified: Secondary | ICD-10-CM | POA: Diagnosis not present

## 2019-11-14 DIAGNOSIS — Z933 Colostomy status: Secondary | ICD-10-CM | POA: Diagnosis not present

## 2019-11-14 DIAGNOSIS — K50011 Crohn's disease of small intestine with rectal bleeding: Secondary | ICD-10-CM | POA: Diagnosis not present

## 2019-11-27 DIAGNOSIS — Z85828 Personal history of other malignant neoplasm of skin: Secondary | ICD-10-CM | POA: Diagnosis not present

## 2019-11-27 DIAGNOSIS — L682 Localized hypertrichosis: Secondary | ICD-10-CM | POA: Diagnosis not present

## 2019-12-13 DIAGNOSIS — Z932 Ileostomy status: Secondary | ICD-10-CM | POA: Diagnosis not present

## 2019-12-13 DIAGNOSIS — K50011 Crohn's disease of small intestine with rectal bleeding: Secondary | ICD-10-CM | POA: Diagnosis not present

## 2019-12-13 DIAGNOSIS — C189 Malignant neoplasm of colon, unspecified: Secondary | ICD-10-CM | POA: Diagnosis not present

## 2019-12-13 DIAGNOSIS — Z933 Colostomy status: Secondary | ICD-10-CM | POA: Diagnosis not present

## 2019-12-19 DIAGNOSIS — N1832 Chronic kidney disease, stage 3b: Secondary | ICD-10-CM | POA: Diagnosis not present

## 2019-12-21 DIAGNOSIS — H401133 Primary open-angle glaucoma, bilateral, severe stage: Secondary | ICD-10-CM | POA: Diagnosis not present

## 2019-12-31 DIAGNOSIS — H401133 Primary open-angle glaucoma, bilateral, severe stage: Secondary | ICD-10-CM | POA: Diagnosis not present

## 2019-12-31 DIAGNOSIS — H35371 Puckering of macula, right eye: Secondary | ICD-10-CM | POA: Diagnosis not present

## 2019-12-31 DIAGNOSIS — H209 Unspecified iridocyclitis: Secondary | ICD-10-CM | POA: Diagnosis not present

## 2019-12-31 DIAGNOSIS — Z961 Presence of intraocular lens: Secondary | ICD-10-CM | POA: Diagnosis not present

## 2020-01-02 ENCOUNTER — Other Ambulatory Visit: Payer: Self-pay | Admitting: Internal Medicine

## 2020-01-06 DIAGNOSIS — H401133 Primary open-angle glaucoma, bilateral, severe stage: Secondary | ICD-10-CM | POA: Diagnosis not present

## 2020-01-08 ENCOUNTER — Ambulatory Visit
Admission: RE | Admit: 2020-01-08 | Discharge: 2020-01-08 | Disposition: A | Discharge status: Home / Self Care | Payer: Medicare PPO | Source: Ambulatory Visit | Attending: Internal Medicine | Admitting: Internal Medicine

## 2020-01-08 ENCOUNTER — Other Ambulatory Visit: Payer: Self-pay | Admitting: Internal Medicine

## 2020-01-08 ENCOUNTER — Other Ambulatory Visit: Payer: Self-pay

## 2020-01-08 DIAGNOSIS — D509 Iron deficiency anemia, unspecified: Secondary | ICD-10-CM | POA: Diagnosis not present

## 2020-01-08 DIAGNOSIS — K50818 Crohn's disease of both small and large intestine with other complication: Secondary | ICD-10-CM

## 2020-01-08 DIAGNOSIS — N1832 Chronic kidney disease, stage 3b: Secondary | ICD-10-CM

## 2020-01-08 DIAGNOSIS — K5090 Crohn's disease, unspecified, without complications: Secondary | ICD-10-CM | POA: Diagnosis not present

## 2020-01-08 MED ORDER — GADOBENATE DIMEGLUMINE 529 MG/ML IV SOLN
5.0000 mL | Freq: Once | INTRAVENOUS | Status: AC | PRN
  Administered 2020-01-08: 5 mL via INTRAVENOUS

## 2020-01-13 DIAGNOSIS — Z933 Colostomy status: Secondary | ICD-10-CM | POA: Diagnosis not present

## 2020-01-13 DIAGNOSIS — Z932 Ileostomy status: Secondary | ICD-10-CM | POA: Diagnosis not present

## 2020-01-13 DIAGNOSIS — C189 Malignant neoplasm of colon, unspecified: Secondary | ICD-10-CM | POA: Diagnosis not present

## 2020-01-13 DIAGNOSIS — K50011 Crohn's disease of small intestine with rectal bleeding: Secondary | ICD-10-CM | POA: Diagnosis not present

## 2020-01-17 DIAGNOSIS — K50818 Crohn's disease of both small and large intestine with other complication: Secondary | ICD-10-CM | POA: Diagnosis not present

## 2020-01-17 DIAGNOSIS — R197 Diarrhea, unspecified: Secondary | ICD-10-CM | POA: Diagnosis not present

## 2020-01-17 DIAGNOSIS — Z932 Ileostomy status: Secondary | ICD-10-CM | POA: Diagnosis not present

## 2020-01-20 ENCOUNTER — Ambulatory Visit: Payer: Medicare PPO | Admitting: Internal Medicine

## 2020-01-20 ENCOUNTER — Other Ambulatory Visit: Payer: Medicare PPO

## 2020-01-21 ENCOUNTER — Inpatient Hospital Stay: Payer: Medicare PPO

## 2020-01-22 DIAGNOSIS — Z933 Colostomy status: Secondary | ICD-10-CM | POA: Diagnosis not present

## 2020-01-22 DIAGNOSIS — K50011 Crohn's disease of small intestine with rectal bleeding: Secondary | ICD-10-CM | POA: Diagnosis not present

## 2020-01-22 DIAGNOSIS — C189 Malignant neoplasm of colon, unspecified: Secondary | ICD-10-CM | POA: Diagnosis not present

## 2020-01-22 DIAGNOSIS — Z932 Ileostomy status: Secondary | ICD-10-CM | POA: Diagnosis not present

## 2020-01-24 DIAGNOSIS — Z932 Ileostomy status: Secondary | ICD-10-CM | POA: Diagnosis not present

## 2020-01-24 DIAGNOSIS — K50011 Crohn's disease of small intestine with rectal bleeding: Secondary | ICD-10-CM | POA: Diagnosis not present

## 2020-01-24 DIAGNOSIS — C189 Malignant neoplasm of colon, unspecified: Secondary | ICD-10-CM | POA: Diagnosis not present

## 2020-01-24 DIAGNOSIS — Z933 Colostomy status: Secondary | ICD-10-CM | POA: Diagnosis not present

## 2020-01-28 ENCOUNTER — Inpatient Hospital Stay: Payer: Medicare PPO | Admitting: Internal Medicine

## 2020-02-03 DIAGNOSIS — Z932 Ileostomy status: Secondary | ICD-10-CM | POA: Diagnosis not present

## 2020-02-03 DIAGNOSIS — R634 Abnormal weight loss: Secondary | ICD-10-CM | POA: Diagnosis not present

## 2020-02-03 DIAGNOSIS — N1832 Chronic kidney disease, stage 3b: Secondary | ICD-10-CM | POA: Diagnosis not present

## 2020-02-03 DIAGNOSIS — K50818 Crohn's disease of both small and large intestine with other complication: Secondary | ICD-10-CM | POA: Diagnosis not present

## 2020-02-03 DIAGNOSIS — G620 Drug-induced polyneuropathy: Secondary | ICD-10-CM | POA: Diagnosis not present

## 2020-02-03 DIAGNOSIS — E559 Vitamin D deficiency, unspecified: Secondary | ICD-10-CM | POA: Diagnosis not present

## 2020-02-03 DIAGNOSIS — R5383 Other fatigue: Secondary | ICD-10-CM | POA: Diagnosis not present

## 2020-02-12 DIAGNOSIS — C189 Malignant neoplasm of colon, unspecified: Secondary | ICD-10-CM | POA: Diagnosis not present

## 2020-02-12 DIAGNOSIS — K50011 Crohn's disease of small intestine with rectal bleeding: Secondary | ICD-10-CM | POA: Diagnosis not present

## 2020-02-12 DIAGNOSIS — Z933 Colostomy status: Secondary | ICD-10-CM | POA: Diagnosis not present

## 2020-02-12 DIAGNOSIS — Z932 Ileostomy status: Secondary | ICD-10-CM | POA: Diagnosis not present

## 2020-02-28 DIAGNOSIS — L900 Lichen sclerosus et atrophicus: Secondary | ICD-10-CM | POA: Diagnosis not present

## 2020-02-28 DIAGNOSIS — L821 Other seborrheic keratosis: Secondary | ICD-10-CM | POA: Diagnosis not present

## 2020-02-28 DIAGNOSIS — D1722 Benign lipomatous neoplasm of skin and subcutaneous tissue of left arm: Secondary | ICD-10-CM | POA: Diagnosis not present

## 2020-02-28 DIAGNOSIS — L7211 Pilar cyst: Secondary | ICD-10-CM | POA: Diagnosis not present

## 2020-02-28 DIAGNOSIS — Z85828 Personal history of other malignant neoplasm of skin: Secondary | ICD-10-CM | POA: Diagnosis not present

## 2020-02-28 DIAGNOSIS — L680 Hirsutism: Secondary | ICD-10-CM | POA: Diagnosis not present

## 2020-03-12 ENCOUNTER — Other Ambulatory Visit: Payer: Self-pay | Admitting: Geriatric Medicine

## 2020-03-12 DIAGNOSIS — Z932 Ileostomy status: Secondary | ICD-10-CM | POA: Diagnosis not present

## 2020-03-12 DIAGNOSIS — Z933 Colostomy status: Secondary | ICD-10-CM | POA: Diagnosis not present

## 2020-03-12 DIAGNOSIS — C189 Malignant neoplasm of colon, unspecified: Secondary | ICD-10-CM | POA: Diagnosis not present

## 2020-03-12 DIAGNOSIS — K50011 Crohn's disease of small intestine with rectal bleeding: Secondary | ICD-10-CM | POA: Diagnosis not present

## 2020-03-12 DIAGNOSIS — Z1231 Encounter for screening mammogram for malignant neoplasm of breast: Secondary | ICD-10-CM

## 2020-03-19 DIAGNOSIS — C189 Malignant neoplasm of colon, unspecified: Secondary | ICD-10-CM | POA: Diagnosis not present

## 2020-03-19 DIAGNOSIS — K50011 Crohn's disease of small intestine with rectal bleeding: Secondary | ICD-10-CM | POA: Diagnosis not present

## 2020-03-19 DIAGNOSIS — Z932 Ileostomy status: Secondary | ICD-10-CM | POA: Diagnosis not present

## 2020-03-19 DIAGNOSIS — Z933 Colostomy status: Secondary | ICD-10-CM | POA: Diagnosis not present

## 2020-04-01 DIAGNOSIS — H401133 Primary open-angle glaucoma, bilateral, severe stage: Secondary | ICD-10-CM | POA: Diagnosis not present

## 2020-04-08 DIAGNOSIS — H52203 Unspecified astigmatism, bilateral: Secondary | ICD-10-CM | POA: Diagnosis not present

## 2020-04-08 DIAGNOSIS — H524 Presbyopia: Secondary | ICD-10-CM | POA: Diagnosis not present

## 2020-04-08 DIAGNOSIS — H5213 Myopia, bilateral: Secondary | ICD-10-CM | POA: Diagnosis not present

## 2020-04-21 DIAGNOSIS — Z932 Ileostomy status: Secondary | ICD-10-CM | POA: Diagnosis not present

## 2020-04-21 DIAGNOSIS — Z933 Colostomy status: Secondary | ICD-10-CM | POA: Diagnosis not present

## 2020-04-21 DIAGNOSIS — C189 Malignant neoplasm of colon, unspecified: Secondary | ICD-10-CM | POA: Diagnosis not present

## 2020-04-21 DIAGNOSIS — K50011 Crohn's disease of small intestine with rectal bleeding: Secondary | ICD-10-CM | POA: Diagnosis not present

## 2020-05-04 DIAGNOSIS — M810 Age-related osteoporosis without current pathological fracture: Secondary | ICD-10-CM | POA: Diagnosis not present

## 2020-05-04 DIAGNOSIS — K50818 Crohn's disease of both small and large intestine with other complication: Secondary | ICD-10-CM | POA: Diagnosis not present

## 2020-05-05 DIAGNOSIS — C189 Malignant neoplasm of colon, unspecified: Secondary | ICD-10-CM | POA: Diagnosis not present

## 2020-05-05 DIAGNOSIS — Z932 Ileostomy status: Secondary | ICD-10-CM | POA: Diagnosis not present

## 2020-05-05 DIAGNOSIS — Z933 Colostomy status: Secondary | ICD-10-CM | POA: Diagnosis not present

## 2020-05-05 DIAGNOSIS — K50011 Crohn's disease of small intestine with rectal bleeding: Secondary | ICD-10-CM | POA: Diagnosis not present

## 2020-05-06 ENCOUNTER — Ambulatory Visit: Payer: Medicare PPO

## 2020-05-07 DIAGNOSIS — Z932 Ileostomy status: Secondary | ICD-10-CM | POA: Diagnosis not present

## 2020-05-07 DIAGNOSIS — Z933 Colostomy status: Secondary | ICD-10-CM | POA: Diagnosis not present

## 2020-05-07 DIAGNOSIS — C189 Malignant neoplasm of colon, unspecified: Secondary | ICD-10-CM | POA: Diagnosis not present

## 2020-05-07 DIAGNOSIS — K50011 Crohn's disease of small intestine with rectal bleeding: Secondary | ICD-10-CM | POA: Diagnosis not present

## 2020-05-20 DIAGNOSIS — Z932 Ileostomy status: Secondary | ICD-10-CM | POA: Diagnosis not present

## 2020-05-20 DIAGNOSIS — K50011 Crohn's disease of small intestine with rectal bleeding: Secondary | ICD-10-CM | POA: Diagnosis not present

## 2020-05-20 DIAGNOSIS — Z933 Colostomy status: Secondary | ICD-10-CM | POA: Diagnosis not present

## 2020-05-20 DIAGNOSIS — C189 Malignant neoplasm of colon, unspecified: Secondary | ICD-10-CM | POA: Diagnosis not present

## 2020-05-22 DIAGNOSIS — H209 Unspecified iridocyclitis: Secondary | ICD-10-CM | POA: Diagnosis not present

## 2020-05-22 DIAGNOSIS — Z961 Presence of intraocular lens: Secondary | ICD-10-CM | POA: Diagnosis not present

## 2020-05-22 DIAGNOSIS — H401133 Primary open-angle glaucoma, bilateral, severe stage: Secondary | ICD-10-CM | POA: Diagnosis not present

## 2020-05-22 DIAGNOSIS — H35371 Puckering of macula, right eye: Secondary | ICD-10-CM | POA: Diagnosis not present

## 2020-06-16 ENCOUNTER — Ambulatory Visit
Admission: RE | Admit: 2020-06-16 | Discharge: 2020-06-16 | Disposition: A | Discharge status: Home / Self Care | Payer: Medicare PPO | Source: Ambulatory Visit | Attending: Geriatric Medicine | Admitting: Geriatric Medicine

## 2020-06-16 ENCOUNTER — Other Ambulatory Visit: Payer: Self-pay

## 2020-06-16 ENCOUNTER — Ambulatory Visit: Payer: Medicare PPO

## 2020-06-16 DIAGNOSIS — Z1231 Encounter for screening mammogram for malignant neoplasm of breast: Secondary | ICD-10-CM

## 2020-06-17 ENCOUNTER — Ambulatory Visit: Payer: Medicare PPO

## 2020-06-18 DIAGNOSIS — N1832 Chronic kidney disease, stage 3b: Secondary | ICD-10-CM | POA: Diagnosis not present

## 2020-06-18 DIAGNOSIS — Z932 Ileostomy status: Secondary | ICD-10-CM | POA: Diagnosis not present

## 2020-06-18 DIAGNOSIS — K50011 Crohn's disease of small intestine with rectal bleeding: Secondary | ICD-10-CM | POA: Diagnosis not present

## 2020-06-18 DIAGNOSIS — C189 Malignant neoplasm of colon, unspecified: Secondary | ICD-10-CM | POA: Diagnosis not present

## 2020-06-18 DIAGNOSIS — Z933 Colostomy status: Secondary | ICD-10-CM | POA: Diagnosis not present

## 2020-07-01 DIAGNOSIS — E781 Pure hyperglyceridemia: Secondary | ICD-10-CM | POA: Diagnosis not present

## 2020-07-01 DIAGNOSIS — D649 Anemia, unspecified: Secondary | ICD-10-CM | POA: Diagnosis not present

## 2020-07-01 DIAGNOSIS — Z79899 Other long term (current) drug therapy: Secondary | ICD-10-CM | POA: Diagnosis not present

## 2020-07-01 DIAGNOSIS — G629 Polyneuropathy, unspecified: Secondary | ICD-10-CM | POA: Diagnosis not present

## 2020-07-01 DIAGNOSIS — Z0000 Encounter for general adult medical examination without abnormal findings: Secondary | ICD-10-CM | POA: Diagnosis not present

## 2020-07-01 DIAGNOSIS — Z1389 Encounter for screening for other disorder: Secondary | ICD-10-CM | POA: Diagnosis not present

## 2020-07-01 DIAGNOSIS — E611 Iron deficiency: Secondary | ICD-10-CM | POA: Diagnosis not present

## 2020-07-01 DIAGNOSIS — E538 Deficiency of other specified B group vitamins: Secondary | ICD-10-CM | POA: Diagnosis not present

## 2020-07-01 DIAGNOSIS — D696 Thrombocytopenia, unspecified: Secondary | ICD-10-CM | POA: Diagnosis not present

## 2020-07-01 DIAGNOSIS — K5090 Crohn's disease, unspecified, without complications: Secondary | ICD-10-CM | POA: Diagnosis not present

## 2020-07-02 DIAGNOSIS — H401133 Primary open-angle glaucoma, bilateral, severe stage: Secondary | ICD-10-CM | POA: Diagnosis not present

## 2020-07-17 DIAGNOSIS — Z933 Colostomy status: Secondary | ICD-10-CM | POA: Diagnosis not present

## 2020-07-17 DIAGNOSIS — Z932 Ileostomy status: Secondary | ICD-10-CM | POA: Diagnosis not present

## 2020-07-17 DIAGNOSIS — K50011 Crohn's disease of small intestine with rectal bleeding: Secondary | ICD-10-CM | POA: Diagnosis not present

## 2020-07-17 DIAGNOSIS — C189 Malignant neoplasm of colon, unspecified: Secondary | ICD-10-CM | POA: Diagnosis not present

## 2020-07-23 DIAGNOSIS — C189 Malignant neoplasm of colon, unspecified: Secondary | ICD-10-CM | POA: Diagnosis not present

## 2020-07-23 DIAGNOSIS — Z932 Ileostomy status: Secondary | ICD-10-CM | POA: Diagnosis not present

## 2020-07-23 DIAGNOSIS — K50011 Crohn's disease of small intestine with rectal bleeding: Secondary | ICD-10-CM | POA: Diagnosis not present

## 2020-07-23 DIAGNOSIS — Z933 Colostomy status: Secondary | ICD-10-CM | POA: Diagnosis not present

## 2020-07-29 DIAGNOSIS — R490 Dysphonia: Secondary | ICD-10-CM | POA: Diagnosis not present

## 2020-07-29 DIAGNOSIS — K219 Gastro-esophageal reflux disease without esophagitis: Secondary | ICD-10-CM | POA: Diagnosis not present

## 2020-07-29 DIAGNOSIS — H6123 Impacted cerumen, bilateral: Secondary | ICD-10-CM | POA: Diagnosis not present

## 2020-08-24 DIAGNOSIS — C189 Malignant neoplasm of colon, unspecified: Secondary | ICD-10-CM | POA: Diagnosis not present

## 2020-08-24 DIAGNOSIS — Z932 Ileostomy status: Secondary | ICD-10-CM | POA: Diagnosis not present

## 2020-08-24 DIAGNOSIS — Z933 Colostomy status: Secondary | ICD-10-CM | POA: Diagnosis not present

## 2020-08-24 DIAGNOSIS — K50011 Crohn's disease of small intestine with rectal bleeding: Secondary | ICD-10-CM | POA: Diagnosis not present

## 2020-08-31 DIAGNOSIS — D509 Iron deficiency anemia, unspecified: Secondary | ICD-10-CM | POA: Diagnosis not present

## 2020-08-31 DIAGNOSIS — Z932 Ileostomy status: Secondary | ICD-10-CM | POA: Diagnosis not present

## 2020-08-31 DIAGNOSIS — K50818 Crohn's disease of both small and large intestine with other complication: Secondary | ICD-10-CM | POA: Diagnosis not present

## 2020-08-31 DIAGNOSIS — Z9049 Acquired absence of other specified parts of digestive tract: Secondary | ICD-10-CM | POA: Diagnosis not present

## 2020-08-31 DIAGNOSIS — E559 Vitamin D deficiency, unspecified: Secondary | ICD-10-CM | POA: Diagnosis not present

## 2020-09-02 DIAGNOSIS — M810 Age-related osteoporosis without current pathological fracture: Secondary | ICD-10-CM | POA: Diagnosis not present

## 2020-09-08 ENCOUNTER — Other Ambulatory Visit: Payer: Self-pay | Admitting: Medical Oncology

## 2020-09-08 DIAGNOSIS — D638 Anemia in other chronic diseases classified elsewhere: Secondary | ICD-10-CM

## 2020-09-09 ENCOUNTER — Telehealth: Payer: Self-pay | Admitting: Physician Assistant

## 2020-09-09 NOTE — Telephone Encounter (Signed)
Scheduled appts per 4/5 sch msg. Pt aware.

## 2020-09-16 ENCOUNTER — Inpatient Hospital Stay: Payer: Medicare PPO

## 2020-09-16 ENCOUNTER — Telehealth: Payer: Self-pay | Admitting: Physician Assistant

## 2020-09-16 NOTE — Telephone Encounter (Signed)
R/s appt per 4/13 sch msg. Pt aware.

## 2020-09-17 ENCOUNTER — Inpatient Hospital Stay: Payer: Medicare PPO | Attending: Physician Assistant | Admitting: Internal Medicine

## 2020-09-17 ENCOUNTER — Other Ambulatory Visit: Payer: Self-pay | Admitting: Physician Assistant

## 2020-09-17 ENCOUNTER — Encounter: Payer: Self-pay | Admitting: Internal Medicine

## 2020-09-17 ENCOUNTER — Inpatient Hospital Stay: Payer: Medicare PPO

## 2020-09-17 ENCOUNTER — Other Ambulatory Visit: Payer: Self-pay

## 2020-09-17 VITALS — BP 163/55 | HR 64 | Temp 97.4°F | Resp 20 | Ht <= 58 in | Wt 85.9 lb

## 2020-09-17 DIAGNOSIS — D500 Iron deficiency anemia secondary to blood loss (chronic): Secondary | ICD-10-CM

## 2020-09-17 DIAGNOSIS — D509 Iron deficiency anemia, unspecified: Secondary | ICD-10-CM | POA: Insufficient documentation

## 2020-09-17 DIAGNOSIS — D638 Anemia in other chronic diseases classified elsewhere: Secondary | ICD-10-CM

## 2020-09-17 DIAGNOSIS — E539 Vitamin B deficiency, unspecified: Secondary | ICD-10-CM | POA: Insufficient documentation

## 2020-09-17 DIAGNOSIS — M810 Age-related osteoporosis without current pathological fracture: Secondary | ICD-10-CM | POA: Insufficient documentation

## 2020-09-17 DIAGNOSIS — K5090 Crohn's disease, unspecified, without complications: Secondary | ICD-10-CM | POA: Insufficient documentation

## 2020-09-17 DIAGNOSIS — K909 Intestinal malabsorption, unspecified: Secondary | ICD-10-CM | POA: Diagnosis not present

## 2020-09-17 DIAGNOSIS — Z79899 Other long term (current) drug therapy: Secondary | ICD-10-CM | POA: Diagnosis not present

## 2020-09-17 LAB — FOLATE: Folate: 96.7 ng/mL (ref >5.9)

## 2020-09-17 NOTE — Progress Notes (Signed)
This encounter was created in error - please disregard.

## 2020-09-21 ENCOUNTER — Other Ambulatory Visit: Payer: Self-pay | Admitting: Medical Oncology

## 2020-09-21 ENCOUNTER — Telehealth: Payer: Self-pay | Admitting: Internal Medicine

## 2020-09-21 DIAGNOSIS — Z933 Colostomy status: Secondary | ICD-10-CM | POA: Diagnosis not present

## 2020-09-21 DIAGNOSIS — Z932 Ileostomy status: Secondary | ICD-10-CM | POA: Diagnosis not present

## 2020-09-21 DIAGNOSIS — C189 Malignant neoplasm of colon, unspecified: Secondary | ICD-10-CM | POA: Diagnosis not present

## 2020-09-21 DIAGNOSIS — D500 Iron deficiency anemia secondary to blood loss (chronic): Secondary | ICD-10-CM

## 2020-09-21 DIAGNOSIS — K50011 Crohn's disease of small intestine with rectal bleeding: Secondary | ICD-10-CM | POA: Diagnosis not present

## 2020-09-21 NOTE — Telephone Encounter (Signed)
Scheduled appts per 4/18 sch msg. Pt aware.

## 2020-09-24 ENCOUNTER — Inpatient Hospital Stay: Payer: Medicare PPO

## 2020-09-24 ENCOUNTER — Inpatient Hospital Stay: Payer: Medicare PPO | Admitting: Physician Assistant

## 2020-09-24 ENCOUNTER — Other Ambulatory Visit: Payer: Self-pay

## 2020-09-24 ENCOUNTER — Encounter: Payer: Self-pay | Admitting: Internal Medicine

## 2020-09-24 ENCOUNTER — Inpatient Hospital Stay (HOSPITAL_BASED_OUTPATIENT_CLINIC_OR_DEPARTMENT_OTHER): Payer: Medicare PPO | Admitting: Internal Medicine

## 2020-09-24 VITALS — BP 142/53 | HR 58 | Temp 97.9°F | Resp 17 | Ht <= 58 in | Wt 85.5 lb

## 2020-09-24 DIAGNOSIS — D500 Iron deficiency anemia secondary to blood loss (chronic): Secondary | ICD-10-CM

## 2020-09-24 DIAGNOSIS — K5090 Crohn's disease, unspecified, without complications: Secondary | ICD-10-CM | POA: Diagnosis not present

## 2020-09-24 DIAGNOSIS — D696 Thrombocytopenia, unspecified: Secondary | ICD-10-CM | POA: Insufficient documentation

## 2020-09-24 DIAGNOSIS — M810 Age-related osteoporosis without current pathological fracture: Secondary | ICD-10-CM | POA: Diagnosis not present

## 2020-09-24 DIAGNOSIS — D509 Iron deficiency anemia, unspecified: Secondary | ICD-10-CM | POA: Diagnosis not present

## 2020-09-24 DIAGNOSIS — E539 Vitamin B deficiency, unspecified: Secondary | ICD-10-CM | POA: Diagnosis not present

## 2020-09-24 DIAGNOSIS — Z79899 Other long term (current) drug therapy: Secondary | ICD-10-CM | POA: Diagnosis not present

## 2020-09-24 DIAGNOSIS — K909 Intestinal malabsorption, unspecified: Secondary | ICD-10-CM | POA: Diagnosis not present

## 2020-09-24 LAB — CBC WITH DIFFERENTIAL (CANCER CENTER ONLY)
Abs Immature Granulocytes: 0.01 10*3/uL (ref 0.00–0.07)
Basophils Absolute: 0 10*3/uL (ref 0.0–0.1)
Basophils Relative: 0 %
Eosinophils Absolute: 0.2 10*3/uL (ref 0.0–0.5)
Eosinophils Relative: 3 %
HCT: 35.2 % — ABNORMAL LOW (ref 36.0–46.0)
Hemoglobin: 11.3 g/dL — ABNORMAL LOW (ref 12.0–15.0)
Immature Granulocytes: 0 %
Lymphocytes Relative: 20 %
Lymphs Abs: 1 10*3/uL (ref 0.7–4.0)
MCH: 31.7 pg (ref 26.0–34.0)
MCHC: 32.1 g/dL (ref 30.0–36.0)
MCV: 98.6 fL (ref 80.0–100.0)
Monocytes Absolute: 0.3 10*3/uL (ref 0.1–1.0)
Monocytes Relative: 6 %
Neutro Abs: 3.7 10*3/uL (ref 1.7–7.7)
Neutrophils Relative %: 71 %
Platelet Count: 44 10*3/uL — ABNORMAL LOW (ref 150–400)
RBC: 3.57 MIL/uL — ABNORMAL LOW (ref 3.87–5.11)
RDW: 13.2 % (ref 11.5–15.5)
WBC Count: 5.2 10*3/uL (ref 4.0–10.5)
nRBC: 0 % (ref 0.0–0.2)

## 2020-09-24 LAB — PLATELET BY CITRATE

## 2020-09-24 NOTE — Progress Notes (Signed)
[place] [phone]   Fax:([phone]  OFFICE PROGRESS NOTE  Lajean Manes, MD 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   PRIOR THERAPY:  1) PRBCs transfusion as needed last was given few days ago.   CURRENT THERAPY:  1) Intravenous Feraheme infusion on as-needed basis. Last dose was given on last infusion was on 09/18/2015 2) vitamin B12 injection on monthly basis.  INTERVAL HISTORY: Kim [name] 80 y.o. female returns to the clinic today for follow-up visit.  The patient is feeling fine today with no concerning complaints.  She was seen by Dr. Emelda Fear her gastroenterologist at [facility] and she was noted to have thrombocytopenia that has been up and down recently.  The patient was last seen by [place] more than a year ago and she has been doing fine and required no iron infusion or blood transfusion. She denied having any bleeding, bruises or ecchymosis.  She denied having any fever or chills.  She has no nausea, vomiting, diarrhea or constipation.  She has no weight loss or night sweats.  She has no chest pain, shortness of breath, cough or hemoptysis.  She is here today for evaluation and repeat blood work.  MEDICAL HISTORY: Past Medical History:  Diagnosis Date  . Anemia    related to malabsorption--gets B12 shots and iron infusions, managed by Dr. Julien Nordmann  . BCC (basal cell carcinoma of skin)   . Crohn disease ([place])    Dr. Emelda Fear at [facility]  . Glaucoma    Dr. Janyth Contes  . Intestinal malabsorption   . OA (osteoarthritis)    hands,back  . Osteoporosis    managed by Dr. Nevada Crane at [facility] [place] q2 yrs    ALLERGIES:  is allergic to antihistamines, chlorpheniramine-type; chlorpheniramine; metronidazole; other; sulfa antibiotics; tape; and wound dressing adhesive.  MEDICATIONS:  Current Outpatient Medications   Medication Sig Dispense Refill  . gabapentin (NEURONTIN) 300 MG capsule Take 300 mg by mouth 2 (two) times daily.    Marland Kitchen acetaminophen (TYLENOL) 500 MG tablet Take 500 mg by mouth 3 (three) times daily.    . brimonidine (ALPHAGAN) 0.2 % ophthalmic solution Per pt takes  Mid Day  1  . CALCIUM CITRATE PO Take by mouth.    . cholecalciferol (VITAMIN D) 1000 UNITS tablet Take 2,000 Units by mouth daily.     . clobetasol cream (TEMOVATE) 0.05 % Apply topically.    . cyanocobalamin (,VITAMIN B-12,) 1000 MCG/ML injection INJECT 1 MILLILITER INTO MUSCLE EVERY 30 DAYS 3 mL 2  . dorzolamide-timolol (COSOPT) 22.3-6.8 MG/ML ophthalmic solution Place 1 drop into both eyes every 12 (twelve) hours. Per pt take Mid day    . Melatonin 10 MG TBCR Take 10 mg by mouth at bedtime as needed for sleep.    . Multiple Vitamins-Minerals (ONE-A-DAY WOMENS 50+ ADVANTAGE) TABS Take 1 tablet by mouth daily.    . risedronate (ACTONEL) 35 MG tablet TAKE 1 TABLET BY MOUTH EVERY 7 DAYS WITH A FULL GLASS OF WATER. DO NOT LIE DOWN FOR THE NEXT 30 MIN.  3   No current facility-administered medications for this visit.   Facility-Administered Medications Ordered in Other Visits  Medication Dose Route Frequency Provider Last Rate Last Admin  . acetaminophen (TYLENOL) tablet 650 mg  650 mg Oral Once Carlton Adam, PA-C        SURGICAL HISTORY:  Past Surgical History:  Procedure Laterality Date  . BREAST EXCISIONAL BIOPSY Right   . Crohn  2009  . internal ostomy pouch  1994  . INTRAMEDULLARY (IM) NAIL INTERTROCHANTERIC Left 03/05/2016   Procedure: INTRAMEDULLARY (IM) NAIL INTERTROCHANTRIC;  Surgeon: Nicholes Stairs, MD;  Location: [place];  Service: Orthopedics;  Laterality: Left;  . REDUCTION MAMMAPLASTY Bilateral 01/2017  . TOTAL COLECTOMY  1984   due to Crohn's    REVIEW OF SYSTEMS:  A comprehensive review of systems was negative.   PHYSICAL EXAMINATION: General appearance: alert, cooperative and no  distress Head: Normocephalic, without obvious abnormality, atraumatic Neck: no adenopathy, no JVD, supple, symmetrical, trachea midline and thyroid not enlarged, symmetric, no tenderness/mass/nodules Lymph nodes: Cervical, supraclavicular, and axillary nodes normal. Resp: clear to auscultation bilaterally Back: symmetric, no curvature. ROM normal. No CVA tenderness. Cardio: regular rate and rhythm, S1, S2 normal, no murmur, click, rub or gallop GI: soft, non-tender; bowel sounds normal; no masses,  no organomegaly Extremities: extremities normal, atraumatic, no cyanosis or edema  ECOG PERFORMANCE STATUS: 1 - Symptomatic but completely ambulatory  Blood pressure (!) 142/53, pulse (!) 58, temperature 97.9 F (36.6 C), temperature source Tympanic, resp. rate 17, height 4' 8"  (1.422 m), weight 85 lb 8 oz (38.8 kg), SpO2 100 %.  LABORATORY DATA: Lab Results  Component Value Date   WBC 5.2 09/24/2020   HGB 11.3 (L) 09/24/2020   HCT 35.2 (L) 09/24/2020   MCV 98.6 09/24/2020   PLT 44 (L) 09/24/2020      Chemistry      Component Value Date/Time   NA 140 07/16/2019 1212   NA 140 11/17/2016 1134   K 3.6 07/16/2019 1212   K 3.9 11/17/2016 1134   CL 110 07/16/2019 1212   CO2 22 07/16/2019 1212   CO2 22 11/17/2016 1134   BUN 38 (H) 07/16/2019 1212   BUN 29.7 (H) 11/17/2016 1134   CREATININE 1.72 (H) 07/16/2019 1212   CREATININE 1.4 (H) 11/17/2016 1134      Component Value Date/Time   CALCIUM 8.8 (L) 07/16/2019 1212   CALCIUM 10.0 11/17/2016 1134   ALKPHOS 54 07/16/2019 1212   ALKPHOS 74 11/17/2016 1134   AST 26 07/16/2019 1212   AST 21 11/17/2016 1134   ALT 30 07/16/2019 1212   ALT 19 11/17/2016 1134   BILITOT 0.3 07/16/2019 1212   BILITOT 0.36 11/17/2016 1134        RADIOGRAPHIC STUDIES: No results found.   ASSESSMENT AND PLAN:  This is a very pleasant 80 years old white female with history of iron deficiency anemia/anemia of chronic disease secondary to malabsorption  and Crohn's disease. The patient require Feraheme infusion at intermittent basis because of her blood loss from the colostomy as well as the malabsorption from Crohn's disease. The patient is currently on vitamin B12 supplement and she is feeling fine. The patient has been doing fine for more than 18 months with no requirement for PRBCs transfusion or iron infusion. She was noted to have thrombocytopenia recently.  We repeated CBC today which showed [place] of 14[address] but with manual count it was normal at 150,000. I assured the patient with her normal platelet count but I will continue to monitor her closely over the next 3 weeks with repeat CBC on weekly basis. For the history of Crohn's disease, she is followed by Dr. Emelda Fear at [facility].  She did not require any treatment recently. I will see the patient on as-needed basis at this  point. She was advised to call immediately if she has any concerning symptoms in the interval. The patient voices understanding of current disease status and treatment options and is in agreement with the current care plan. All questions were answered. The patient knows to call the clinic with any problems, questions or concerns. We can certainly see the patient much sooner if necessary.  Disclaimer: This note was dictated with voice recognition software. Similar sounding words can inadvertently be transcribed and may be missed upon review.

## 2020-10-01 ENCOUNTER — Inpatient Hospital Stay: Payer: Medicare PPO

## 2020-10-01 ENCOUNTER — Other Ambulatory Visit: Payer: Self-pay

## 2020-10-01 DIAGNOSIS — M810 Age-related osteoporosis without current pathological fracture: Secondary | ICD-10-CM | POA: Diagnosis not present

## 2020-10-01 DIAGNOSIS — Z79899 Other long term (current) drug therapy: Secondary | ICD-10-CM | POA: Diagnosis not present

## 2020-10-01 DIAGNOSIS — K5090 Crohn's disease, unspecified, without complications: Secondary | ICD-10-CM | POA: Diagnosis not present

## 2020-10-01 DIAGNOSIS — K909 Intestinal malabsorption, unspecified: Secondary | ICD-10-CM | POA: Diagnosis not present

## 2020-10-01 DIAGNOSIS — D696 Thrombocytopenia, unspecified: Secondary | ICD-10-CM

## 2020-10-01 DIAGNOSIS — E539 Vitamin B deficiency, unspecified: Secondary | ICD-10-CM | POA: Diagnosis not present

## 2020-10-01 DIAGNOSIS — D509 Iron deficiency anemia, unspecified: Secondary | ICD-10-CM | POA: Diagnosis not present

## 2020-10-01 LAB — CBC WITH DIFFERENTIAL (CANCER CENTER ONLY)
Abs Immature Granulocytes: 0.01 10*3/uL (ref 0.00–0.07)
Basophils Absolute: 0 10*3/uL (ref 0.0–0.1)
Basophils Relative: 0 %
Eosinophils Absolute: 0.2 10*3/uL (ref 0.0–0.5)
Eosinophils Relative: 3 %
HCT: 31.7 % — ABNORMAL LOW (ref 36.0–46.0)
Hemoglobin: 10.3 g/dL — ABNORMAL LOW (ref 12.0–15.0)
Immature Granulocytes: 0 %
Lymphocytes Relative: 19 %
Lymphs Abs: 1 10*3/uL (ref 0.7–4.0)
MCH: 32.2 pg (ref 26.0–34.0)
MCHC: 32.5 g/dL (ref 30.0–36.0)
MCV: 99.1 fL (ref 80.0–100.0)
Monocytes Absolute: 0.4 10*3/uL (ref 0.1–1.0)
Monocytes Relative: 8 %
Neutro Abs: 3.7 10*3/uL (ref 1.7–7.7)
Neutrophils Relative %: 70 %
Platelet Count: ADEQUATE 10*3/uL (ref 150–400)
RBC: 3.2 MIL/uL — ABNORMAL LOW (ref 3.87–5.11)
RDW: 13.1 % (ref 11.5–15.5)
WBC Count: 5.3 10*3/uL (ref 4.0–10.5)
nRBC: 0 % (ref 0.0–0.2)

## 2020-10-01 LAB — IMMATURE PLATELET FRACTION: Immature Platelet Fraction: 41 % — ABNORMAL HIGH (ref 1.2–8.6)

## 2020-10-07 DIAGNOSIS — K50011 Crohn's disease of small intestine with rectal bleeding: Secondary | ICD-10-CM | POA: Diagnosis not present

## 2020-10-07 DIAGNOSIS — Z932 Ileostomy status: Secondary | ICD-10-CM | POA: Diagnosis not present

## 2020-10-07 DIAGNOSIS — Z933 Colostomy status: Secondary | ICD-10-CM | POA: Diagnosis not present

## 2020-10-07 DIAGNOSIS — C189 Malignant neoplasm of colon, unspecified: Secondary | ICD-10-CM | POA: Diagnosis not present

## 2020-10-08 ENCOUNTER — Inpatient Hospital Stay: Payer: Medicare PPO | Attending: Physician Assistant

## 2020-10-08 ENCOUNTER — Other Ambulatory Visit: Payer: Self-pay

## 2020-10-08 DIAGNOSIS — D509 Iron deficiency anemia, unspecified: Secondary | ICD-10-CM | POA: Insufficient documentation

## 2020-10-08 DIAGNOSIS — D696 Thrombocytopenia, unspecified: Secondary | ICD-10-CM | POA: Diagnosis not present

## 2020-10-08 LAB — CBC WITH DIFFERENTIAL (CANCER CENTER ONLY)
Abs Immature Granulocytes: 0.01 10*3/uL (ref 0.00–0.07)
Basophils Absolute: 0 10*3/uL (ref 0.0–0.1)
Basophils Relative: 0 %
Eosinophils Absolute: 0.2 10*3/uL (ref 0.0–0.5)
Eosinophils Relative: 3 %
HCT: 33.1 % — ABNORMAL LOW (ref 36.0–46.0)
Hemoglobin: 10.8 g/dL — ABNORMAL LOW (ref 12.0–15.0)
Immature Granulocytes: 0 %
Lymphocytes Relative: 20 %
Lymphs Abs: 1.1 10*3/uL (ref 0.7–4.0)
MCH: 32 pg (ref 26.0–34.0)
MCHC: 32.6 g/dL (ref 30.0–36.0)
MCV: 97.9 fL (ref 80.0–100.0)
Monocytes Absolute: 0.4 10*3/uL (ref 0.1–1.0)
Monocytes Relative: 7 %
Neutro Abs: 3.9 10*3/uL (ref 1.7–7.7)
Neutrophils Relative %: 70 %
Platelet Count: UNDETERMINED 10*3/uL (ref 150–400)
RBC: 3.38 MIL/uL — ABNORMAL LOW (ref 3.87–5.11)
RDW: 13.1 % (ref 11.5–15.5)
WBC Count: 5.5 10*3/uL (ref 4.0–10.5)
nRBC: 0 % (ref 0.0–0.2)

## 2020-10-14 DIAGNOSIS — M810 Age-related osteoporosis without current pathological fracture: Secondary | ICD-10-CM | POA: Diagnosis not present

## 2020-10-15 ENCOUNTER — Inpatient Hospital Stay: Payer: Medicare PPO

## 2020-10-15 ENCOUNTER — Other Ambulatory Visit: Payer: Self-pay

## 2020-10-15 DIAGNOSIS — D696 Thrombocytopenia, unspecified: Secondary | ICD-10-CM | POA: Diagnosis not present

## 2020-10-15 DIAGNOSIS — D509 Iron deficiency anemia, unspecified: Secondary | ICD-10-CM | POA: Diagnosis not present

## 2020-10-15 LAB — CBC WITH DIFFERENTIAL (CANCER CENTER ONLY)
Abs Immature Granulocytes: 0.01 10*3/uL (ref 0.00–0.07)
Basophils Absolute: 0 10*3/uL (ref 0.0–0.1)
Basophils Relative: 0 %
Eosinophils Absolute: 0.2 10*3/uL (ref 0.0–0.5)
Eosinophils Relative: 3 %
HCT: 33.5 % — ABNORMAL LOW (ref 36.0–46.0)
Hemoglobin: 10.7 g/dL — ABNORMAL LOW (ref 12.0–15.0)
Immature Granulocytes: 0 %
Lymphocytes Relative: 14 %
Lymphs Abs: 0.9 10*3/uL (ref 0.7–4.0)
MCH: 31.6 pg (ref 26.0–34.0)
MCHC: 31.9 g/dL (ref 30.0–36.0)
MCV: 98.8 fL (ref 80.0–100.0)
Monocytes Absolute: 0.6 10*3/uL (ref 0.1–1.0)
Monocytes Relative: 9 %
Neutro Abs: 5 10*3/uL (ref 1.7–7.7)
Neutrophils Relative %: 74 %
Platelet Count: UNDETERMINED 10*3/uL (ref 150–400)
RBC: 3.39 MIL/uL — ABNORMAL LOW (ref 3.87–5.11)
RDW: 13.1 % (ref 11.5–15.5)
WBC Count: 6.7 10*3/uL (ref 4.0–10.5)
nRBC: 0 % (ref 0.0–0.2)

## 2020-10-20 DIAGNOSIS — Z932 Ileostomy status: Secondary | ICD-10-CM | POA: Diagnosis not present

## 2020-10-20 DIAGNOSIS — Z933 Colostomy status: Secondary | ICD-10-CM | POA: Diagnosis not present

## 2020-10-20 DIAGNOSIS — C189 Malignant neoplasm of colon, unspecified: Secondary | ICD-10-CM | POA: Diagnosis not present

## 2020-10-20 DIAGNOSIS — K50011 Crohn's disease of small intestine with rectal bleeding: Secondary | ICD-10-CM | POA: Diagnosis not present

## 2020-10-28 ENCOUNTER — Telehealth: Payer: Self-pay | Admitting: Medical Oncology

## 2020-10-28 DIAGNOSIS — H401133 Primary open-angle glaucoma, bilateral, severe stage: Secondary | ICD-10-CM | POA: Diagnosis not present

## 2020-10-28 NOTE — Telephone Encounter (Signed)
Pt called to request her plt count and for f/u appt. Per Dr Julien Nordmann schedule pt to see me in 2-3 months.  Pt wants appt in [place]. Lab and f/u Scheduled for aug 3rd

## 2020-12-11 DIAGNOSIS — Z933 Colostomy status: Secondary | ICD-10-CM | POA: Diagnosis not present

## 2020-12-11 DIAGNOSIS — Z932 Ileostomy status: Secondary | ICD-10-CM | POA: Diagnosis not present

## 2020-12-11 DIAGNOSIS — C189 Malignant neoplasm of colon, unspecified: Secondary | ICD-10-CM | POA: Diagnosis not present

## 2020-12-11 DIAGNOSIS — K50011 Crohn's disease of small intestine with rectal bleeding: Secondary | ICD-10-CM | POA: Diagnosis not present

## 2020-12-30 DIAGNOSIS — H16223 Keratoconjunctivitis sicca, not specified as Sjogren's, bilateral: Secondary | ICD-10-CM | POA: Diagnosis not present

## 2021-01-06 ENCOUNTER — Inpatient Hospital Stay (HOSPITAL_BASED_OUTPATIENT_CLINIC_OR_DEPARTMENT_OTHER): Payer: Medicare PPO | Admitting: Internal Medicine

## 2021-01-06 ENCOUNTER — Other Ambulatory Visit: Payer: Self-pay | Admitting: Medical Oncology

## 2021-01-06 ENCOUNTER — Inpatient Hospital Stay: Payer: Medicare PPO | Attending: Physician Assistant

## 2021-01-06 ENCOUNTER — Other Ambulatory Visit: Payer: Self-pay

## 2021-01-06 VITALS — BP 129/44 | HR 57 | Temp 96.3°F | Resp 17 | Ht <= 58 in | Wt 89.1 lb

## 2021-01-06 DIAGNOSIS — K909 Intestinal malabsorption, unspecified: Secondary | ICD-10-CM | POA: Diagnosis not present

## 2021-01-06 DIAGNOSIS — M1990 Unspecified osteoarthritis, unspecified site: Secondary | ICD-10-CM | POA: Diagnosis not present

## 2021-01-06 DIAGNOSIS — D500 Iron deficiency anemia secondary to blood loss (chronic): Secondary | ICD-10-CM

## 2021-01-06 DIAGNOSIS — D539 Nutritional anemia, unspecified: Secondary | ICD-10-CM | POA: Diagnosis not present

## 2021-01-06 DIAGNOSIS — D638 Anemia in other chronic diseases classified elsewhere: Secondary | ICD-10-CM

## 2021-01-06 DIAGNOSIS — Z85828 Personal history of other malignant neoplasm of skin: Secondary | ICD-10-CM | POA: Insufficient documentation

## 2021-01-06 DIAGNOSIS — D509 Iron deficiency anemia, unspecified: Secondary | ICD-10-CM | POA: Insufficient documentation

## 2021-01-06 DIAGNOSIS — M810 Age-related osteoporosis without current pathological fracture: Secondary | ICD-10-CM | POA: Diagnosis not present

## 2021-01-06 DIAGNOSIS — K5090 Crohn's disease, unspecified, without complications: Secondary | ICD-10-CM | POA: Diagnosis not present

## 2021-01-06 DIAGNOSIS — Z933 Colostomy status: Secondary | ICD-10-CM | POA: Insufficient documentation

## 2021-01-06 DIAGNOSIS — E538 Deficiency of other specified B group vitamins: Secondary | ICD-10-CM | POA: Diagnosis not present

## 2021-01-06 DIAGNOSIS — D696 Thrombocytopenia, unspecified: Secondary | ICD-10-CM

## 2021-01-06 LAB — CMP (CANCER CENTER ONLY)
ALT: 27 U/L (ref 0–44)
AST: 24 U/L (ref 15–41)
Albumin: 3.4 g/dL — ABNORMAL LOW (ref 3.5–5.0)
Alkaline Phosphatase: 51 U/L (ref 38–126)
Anion gap: 9 (ref 5–15)
BUN: 25 mg/dL — ABNORMAL HIGH (ref 8–23)
CO2: 25 mmol/L (ref 22–32)
Calcium: 9.4 mg/dL (ref 8.9–10.3)
Chloride: 108 mmol/L (ref 98–111)
Creatinine: 1.48 mg/dL — ABNORMAL HIGH (ref 0.44–1.00)
GFR, Estimated: 36 mL/min — ABNORMAL LOW (ref >60)
Glucose, Bld: 85 mg/dL (ref 70–99)
Potassium: 4.7 mmol/L (ref 3.5–5.1)
Sodium: 142 mmol/L (ref 135–145)
Total Bilirubin: 0.4 mg/dL (ref 0.3–1.2)
Total Protein: 7 g/dL (ref 6.5–8.1)

## 2021-01-06 LAB — CBC WITH DIFFERENTIAL (CANCER CENTER ONLY)
Abs Immature Granulocytes: 0.01 10*3/uL (ref 0.00–0.07)
Basophils Absolute: 0 10*3/uL (ref 0.0–0.1)
Basophils Relative: 0 %
Eosinophils Absolute: 0.1 10*3/uL (ref 0.0–0.5)
Eosinophils Relative: 2 %
HCT: 32.5 % — ABNORMAL LOW (ref 36.0–46.0)
Hemoglobin: 10.5 g/dL — ABNORMAL LOW (ref 12.0–15.0)
Immature Granulocytes: 0 %
Lymphocytes Relative: 19 %
Lymphs Abs: 0.9 10*3/uL (ref 0.7–4.0)
MCH: 32 pg (ref 26.0–34.0)
MCHC: 32.3 g/dL (ref 30.0–36.0)
MCV: 99.1 fL (ref 80.0–100.0)
Monocytes Absolute: 0.4 10*3/uL (ref 0.1–1.0)
Monocytes Relative: 8 %
Neutro Abs: 3.5 10*3/uL (ref 1.7–7.7)
Neutrophils Relative %: 71 %
Platelet Count: DECREASED 10*3/uL (ref 150–400)
RBC: 3.28 MIL/uL — ABNORMAL LOW (ref 3.87–5.11)
RDW: 13 % (ref 11.5–15.5)
Smear Review: NORMAL
WBC Count: 5 10*3/uL (ref 4.0–10.5)
nRBC: 0 % (ref 0.0–0.2)

## 2021-01-06 LAB — TECHNOLOGIST SMEAR REVIEW

## 2021-01-06 NOTE — Progress Notes (Signed)
[place] [phone]   Fax:(336) [phone]  OFFICE PROGRESS NOTE  Lajean Manes, MD 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   PRIOR THERAPY:  1) PRBCs transfusion as needed last was given few days ago.   CURRENT THERAPY:  1) Intravenous Feraheme infusion on as-needed basis. Last dose was given on last infusion was on 09/18/2015 2) vitamin B12 injection on monthly basis.  INTERVAL HISTORY: Kim [name] 80 y.o. female returns to the clinic today for follow-up visit.  The patient is feeling fine today with no concerning complaints.  She exercise at regular basis.  She denied having any significant fatigue or weakness.  She has no nausea, vomiting, diarrhea or constipation.  She has no weight loss or night sweats.  She has no chest pain, shortness of breath, cough or hemoptysis.  She has no bleeding, bruises or ecchymosis.  She is here today for evaluation with repeat CBC and comprehensive metabolic panel.  MEDICAL HISTORY: Past Medical History:  Diagnosis Date   Anemia    related to malabsorption--gets B12 shots and iron infusions, managed by Dr. Julien Nordmann   [facility] (basal cell carcinoma of skin)    Crohn disease ([place])    Dr. Emelda Fear at [facility]   Glaucoma    Dr. Janyth Contes   Intestinal malabsorption    OA (osteoarthritis)    hands,back   Osteoporosis    managed by Dr. Nevada Crane at [facility], [place] q2 yrs    ALLERGIES:  is allergic to antihistamines, chlorpheniramine-type; chlorpheniramine; metronidazole; other; sulfa antibiotics; tape; and wound dressing adhesive.  MEDICATIONS:  Current Outpatient Medications  Medication Sig Dispense Refill   acetaminophen (TYLENOL) 500 MG tablet Take 1,000 mg by mouth daily. 500 mg tablets     brimonidine (ALPHAGAN) 0.2 % ophthalmic solution Per pt takes  Mid Day  1   Calcium Citrate-Vitamin D  200-125 MG-UNIT TABS Take 1 tablet by mouth 2 (two) times daily.     cholecalciferol (VITAMIN D) 25 MCG (1000 UNIT) tablet Take 2 tablets by mouth daily.     clobetasol cream (TEMOVATE) 0.05 % Apply topically.     cyanocobalamin (,VITAMIN B-12,) 1000 MCG/ML injection INJECT 1 MILLILITER INTO MUSCLE EVERY 30 DAYS 3 mL 2   dorzolamide-timolol (COSOPT) 22.3-6.8 MG/ML ophthalmic solution Place 1 drop into both eyes every 12 (twelve) hours. Per pt take Mid day     gabapentin (NEURONTIN) 300 MG capsule Take 300 mg by mouth 2 (two) times daily.     Melatonin 10 MG TBCR Take 10 mg by mouth at bedtime as needed for sleep.     Multiple Vitamins-Minerals (ONE-A-DAY WOMENS 50+ ADVANTAGE) TABS Take 1 tablet by mouth daily.     No current facility-administered medications for this visit.   Facility-Administered Medications Ordered in Other Visits  Medication Dose Route Frequency Provider Last Rate Last Admin   acetaminophen (TYLENOL) tablet 650 mg  650 mg Oral Once Carlton Adam, PA-C        SURGICAL HISTORY:  Past Surgical History:  Procedure Laterality Date   BREAST EXCISIONAL BIOPSY Right    Crohn  2009   internal ostomy pouch  1994   INTRAMEDULLARY (IM) NAIL INTERTROCHANTERIC Left 03/05/2016   Procedure: INTRAMEDULLARY (IM) NAIL INTERTROCHANTRIC;  Surgeon: Nicholes Stairs, MD;  Location: [place];  Service: Orthopedics;  Laterality: Left;   REDUCTION MAMMAPLASTY Bilateral 01/2017  TOTAL COLECTOMY  1984   due to Crohn's    REVIEW OF SYSTEMS:  A comprehensive review of systems was negative.   PHYSICAL EXAMINATION: General appearance: alert, cooperative, and no distress Head: Normocephalic, without obvious abnormality, atraumatic Neck: no adenopathy, no JVD, supple, symmetrical, trachea midline, and thyroid not enlarged, symmetric, no tenderness/mass/nodules Lymph nodes: Cervical, supraclavicular, and axillary nodes normal. Resp: clear to auscultation bilaterally Back: symmetric, no  curvature. ROM normal. No CVA tenderness. Cardio: regular rate and rhythm, S1, S2 normal, no murmur, click, rub or gallop GI: soft, non-tender; bowel sounds normal; no masses,  no organomegaly Extremities: extremities normal, atraumatic, no cyanosis or edema  ECOG PERFORMANCE STATUS: 1 - Symptomatic but completely ambulatory  Blood pressure (!) 129/44, pulse (!) 57, temperature (!) 96.3 F (35.7 C), temperature source Tympanic, resp. rate 17, height 4' 8"  (1.422 m), weight 89 lb 1.6 oz (40.4 kg), SpO2 100 %.  LABORATORY DATA: Lab Results  Component Value Date   WBC 6.7 10/15/2020   HGB 10.7 (L) 10/15/2020   HCT 33.5 (L) 10/15/2020   MCV 98.8 10/15/2020   PLT PLATELET CLUMPS NOTED ON SMEAR, UNABLE TO ESTIMATE 10/15/2020      Chemistry      Component Value Date/Time   NA 140 07/16/2019 1212   NA 140 11/17/2016 1134   K 3.6 07/16/2019 1212   K 3.9 11/17/2016 1134   CL 110 07/16/2019 1212   CO2 22 07/16/2019 1212   CO2 22 11/17/2016 1134   BUN 38 (H) 07/16/2019 1212   BUN 29.7 (H) 11/17/2016 1134   CREATININE 1.72 (H) 07/16/2019 1212   CREATININE 1.4 (H) 11/17/2016 1134      Component Value Date/Time   CALCIUM 8.8 (L) 07/16/2019 1212   CALCIUM 10.0 11/17/2016 1134   ALKPHOS 54 07/16/2019 1212   ALKPHOS 74 11/17/2016 1134   AST 26 07/16/2019 1212   AST 21 11/17/2016 1134   ALT 30 07/16/2019 1212   ALT 19 11/17/2016 1134   BILITOT 0.3 07/16/2019 1212   BILITOT 0.36 11/17/2016 1134        RADIOGRAPHIC STUDIES: No results found.   ASSESSMENT AND PLAN:  This is a very pleasant 80 years old white female with history of iron deficiency anemia/anemia of chronic disease secondary to malabsorption and Crohn's disease. The patient require Feraheme infusion at intermittent basis because of her blood loss from the colostomy as well as the malabsorption from Crohn's disease. The patient is currently on vitamin B12 supplement and she is feeling fine. For the last 2 years the  patient has been doing very well with no concerning complaints or requirement for blood transfusion or iron infusion. Repeat CBC today showed the persistent mild anemia.  Her reported platelets count on the machine was 22,000 but I personally reviewed the peripheral blood smear and also the lab technologist did a manual count with a range of platelet count between 135,000 -158,000 but this could not be reported formally because its not exact number. I assured the patient of her condition and will continue to monitor her closely with repeat blood work in 3 months. For the history of Crohn's disease, she is followed by Dr. Emelda Fear at [facility].  She did not require any treatment recently. She was advised to call immediately if she has any other concerning symptoms in the interval. The patient voices understanding of current disease status and treatment options and is in agreement with the current care plan. All questions were answered. The patient  knows to call the clinic with any problems, questions or concerns. We can certainly see the patient much sooner if necessary.  Disclaimer: This note was dictated with voice recognition software. Similar sounding words can inadvertently be transcribed and may be missed upon review.

## 2021-01-18 DIAGNOSIS — C189 Malignant neoplasm of colon, unspecified: Secondary | ICD-10-CM | POA: Diagnosis not present

## 2021-01-18 DIAGNOSIS — K50011 Crohn's disease of small intestine with rectal bleeding: Secondary | ICD-10-CM | POA: Diagnosis not present

## 2021-01-18 DIAGNOSIS — Z932 Ileostomy status: Secondary | ICD-10-CM | POA: Diagnosis not present

## 2021-01-18 DIAGNOSIS — Z933 Colostomy status: Secondary | ICD-10-CM | POA: Diagnosis not present

## 2021-03-15 DIAGNOSIS — K50011 Crohn's disease of small intestine with rectal bleeding: Secondary | ICD-10-CM | POA: Diagnosis not present

## 2021-03-15 DIAGNOSIS — Z932 Ileostomy status: Secondary | ICD-10-CM | POA: Diagnosis not present

## 2021-03-15 DIAGNOSIS — C189 Malignant neoplasm of colon, unspecified: Secondary | ICD-10-CM | POA: Diagnosis not present

## 2021-03-15 DIAGNOSIS — Z933 Colostomy status: Secondary | ICD-10-CM | POA: Diagnosis not present

## 2021-03-22 ENCOUNTER — Telehealth: Payer: Self-pay | Admitting: Internal Medicine

## 2021-03-22 NOTE — Telephone Encounter (Signed)
Rescheduled upcoming appointment due to provider's PAL. Patient is aware of changes. ?

## 2021-04-05 DIAGNOSIS — E559 Vitamin D deficiency, unspecified: Secondary | ICD-10-CM | POA: Diagnosis not present

## 2021-04-05 DIAGNOSIS — N83202 Unspecified ovarian cyst, left side: Secondary | ICD-10-CM | POA: Diagnosis not present

## 2021-04-05 DIAGNOSIS — Z9049 Acquired absence of other specified parts of digestive tract: Secondary | ICD-10-CM | POA: Diagnosis not present

## 2021-04-05 DIAGNOSIS — N183 Chronic kidney disease, stage 3 unspecified: Secondary | ICD-10-CM | POA: Diagnosis not present

## 2021-04-05 DIAGNOSIS — Z932 Ileostomy status: Secondary | ICD-10-CM | POA: Diagnosis not present

## 2021-04-05 DIAGNOSIS — D509 Iron deficiency anemia, unspecified: Secondary | ICD-10-CM | POA: Diagnosis not present

## 2021-04-05 DIAGNOSIS — K50818 Crohn's disease of both small and large intestine with other complication: Secondary | ICD-10-CM | POA: Diagnosis not present

## 2021-04-05 DIAGNOSIS — N83201 Unspecified ovarian cyst, right side: Secondary | ICD-10-CM | POA: Diagnosis not present

## 2021-04-07 ENCOUNTER — Other Ambulatory Visit: Payer: Self-pay | Admitting: Internal Medicine

## 2021-04-07 ENCOUNTER — Inpatient Hospital Stay: Payer: Medicare PPO | Admitting: Internal Medicine

## 2021-04-07 ENCOUNTER — Inpatient Hospital Stay: Payer: Medicare PPO

## 2021-04-07 DIAGNOSIS — K50818 Crohn's disease of both small and large intestine with other complication: Secondary | ICD-10-CM

## 2021-04-12 ENCOUNTER — Telehealth: Payer: Self-pay

## 2021-04-12 DIAGNOSIS — Z933 Colostomy status: Secondary | ICD-10-CM | POA: Diagnosis not present

## 2021-04-12 DIAGNOSIS — Z932 Ileostomy status: Secondary | ICD-10-CM | POA: Diagnosis not present

## 2021-04-12 DIAGNOSIS — K50011 Crohn's disease of small intestine with rectal bleeding: Secondary | ICD-10-CM | POA: Diagnosis not present

## 2021-04-12 DIAGNOSIS — C189 Malignant neoplasm of colon, unspecified: Secondary | ICD-10-CM | POA: Diagnosis not present

## 2021-04-12 NOTE — Telephone Encounter (Signed)
Patient requesting to transfer care to Dr. Quay Burow.

## 2021-04-12 NOTE — Telephone Encounter (Signed)
I will accept her

## 2021-04-14 ENCOUNTER — Inpatient Hospital Stay (HOSPITAL_BASED_OUTPATIENT_CLINIC_OR_DEPARTMENT_OTHER): Payer: Medicare PPO | Admitting: Internal Medicine

## 2021-04-14 ENCOUNTER — Inpatient Hospital Stay: Payer: Medicare PPO | Attending: Physician Assistant

## 2021-04-14 ENCOUNTER — Other Ambulatory Visit: Payer: Self-pay

## 2021-04-14 VITALS — BP 112/43 | HR 57 | Temp 96.4°F | Resp 18 | Ht <= 58 in | Wt 88.3 lb

## 2021-04-14 DIAGNOSIS — D509 Iron deficiency anemia, unspecified: Secondary | ICD-10-CM

## 2021-04-14 DIAGNOSIS — M1990 Unspecified osteoarthritis, unspecified site: Secondary | ICD-10-CM | POA: Insufficient documentation

## 2021-04-14 DIAGNOSIS — K909 Intestinal malabsorption, unspecified: Secondary | ICD-10-CM | POA: Insufficient documentation

## 2021-04-14 DIAGNOSIS — Z85828 Personal history of other malignant neoplasm of skin: Secondary | ICD-10-CM | POA: Insufficient documentation

## 2021-04-14 DIAGNOSIS — M810 Age-related osteoporosis without current pathological fracture: Secondary | ICD-10-CM | POA: Insufficient documentation

## 2021-04-14 DIAGNOSIS — D539 Nutritional anemia, unspecified: Secondary | ICD-10-CM

## 2021-04-14 DIAGNOSIS — D638 Anemia in other chronic diseases classified elsewhere: Secondary | ICD-10-CM

## 2021-04-14 DIAGNOSIS — K5090 Crohn's disease, unspecified, without complications: Secondary | ICD-10-CM | POA: Insufficient documentation

## 2021-04-14 LAB — IRON AND TIBC
Iron: 105 ug/dL (ref 41–142)
Saturation Ratios: 42 % (ref 21–57)
TIBC: 253 ug/dL (ref 236–444)
UIBC: 147 ug/dL (ref 120–384)

## 2021-04-14 LAB — CBC WITH DIFFERENTIAL (CANCER CENTER ONLY)
Abs Immature Granulocytes: 0.02 10*3/uL (ref 0.00–0.07)
Basophils Absolute: 0 10*3/uL (ref 0.0–0.1)
Basophils Relative: 0 %
Eosinophils Absolute: 0.1 10*3/uL (ref 0.0–0.5)
Eosinophils Relative: 2 %
HCT: 31.1 % — ABNORMAL LOW (ref 36.0–46.0)
Hemoglobin: 10.2 g/dL — ABNORMAL LOW (ref 12.0–15.0)
Immature Granulocytes: 0 %
Lymphocytes Relative: 19 %
Lymphs Abs: 0.9 10*3/uL (ref 0.7–4.0)
MCH: 31.8 pg (ref 26.0–34.0)
MCHC: 32.8 g/dL (ref 30.0–36.0)
MCV: 96.9 fL (ref 80.0–100.0)
Monocytes Absolute: 0.4 10*3/uL (ref 0.1–1.0)
Monocytes Relative: 7 %
Neutro Abs: 3.5 10*3/uL (ref 1.7–7.7)
Neutrophils Relative %: 72 %
Platelet Count: UNDETERMINED 10*3/uL (ref 150–400)
RBC: 3.21 MIL/uL — ABNORMAL LOW (ref 3.87–5.11)
RDW: 13.6 % (ref 11.5–15.5)
WBC Count: 4.9 10*3/uL (ref 4.0–10.5)
nRBC: 0 % (ref 0.0–0.2)

## 2021-04-14 LAB — CMP (CANCER CENTER ONLY)
ALT: 28 U/L (ref 0–44)
AST: 27 U/L (ref 15–41)
Albumin: 3.4 g/dL — ABNORMAL LOW (ref 3.5–5.0)
Alkaline Phosphatase: 53 U/L (ref 38–126)
Anion gap: 8 (ref 5–15)
BUN: 33 mg/dL — ABNORMAL HIGH (ref 8–23)
CO2: 24 mmol/L (ref 22–32)
Calcium: 9 mg/dL (ref 8.9–10.3)
Chloride: 109 mmol/L (ref 98–111)
Creatinine: 1.39 mg/dL — ABNORMAL HIGH (ref 0.44–1.00)
GFR, Estimated: 39 mL/min — ABNORMAL LOW (ref >60)
Glucose, Bld: 83 mg/dL (ref 70–99)
Potassium: 4.2 mmol/L (ref 3.5–5.1)
Sodium: 141 mmol/L (ref 135–145)
Total Bilirubin: 0.4 mg/dL (ref 0.3–1.2)
Total Protein: 7.1 g/dL (ref 6.5–8.1)

## 2021-04-14 LAB — VITAMIN B12: Vitamin B-12: 689 pg/mL (ref 180–914)

## 2021-04-14 LAB — SAVE SMEAR(SSMR), FOR PROVIDER SLIDE REVIEW

## 2021-04-14 LAB — FERRITIN: Ferritin: 786 ng/mL — ABNORMAL HIGH (ref 11–307)

## 2021-04-14 NOTE — Progress Notes (Signed)
[place] Telephone:(336) [phone]   Fax:([phone]  OFFICE PROGRESS NOTE  Lajean Manes, MD 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   PRIOR THERAPY:  1) PRBCs transfusion as needed last was given few days ago.   CURRENT THERAPY:  1) Intravenous Feraheme infusion on as-needed basis. Last dose was given on last infusion was on 09/18/2015 2) vitamin B12 injection on monthly basis.  INTERVAL HISTORY: Kim [name] 80 y.o. female returns to the clinic today for follow-up visit.  The patient is feeling fine today with no concerning complaints.  She continues to exercise at regular basis.  She denied having any current chest pain, shortness of breath, cough or hemoptysis.  She has no nausea, vomiting, diarrhea or constipation.  She has no headache or visual changes.  She is here today for evaluation and repeat anemia panel.  MEDICAL HISTORY: Past Medical History:  Diagnosis Date   Anemia    related to malabsorption--gets B12 shots and iron infusions, managed by Dr. Julien Nordmann   [facility] (basal cell carcinoma of skin)    Crohn disease ([place])    Dr. Emelda Fear at [facility]   Glaucoma    Dr. Janyth Contes   Intestinal malabsorption    OA (osteoarthritis)    hands,back   Osteoporosis    managed by Dr. Nevada Crane at [facility] [place] q2 yrs    ALLERGIES:  is allergic to antihistamines, chlorpheniramine-type; chlorpheniramine; metronidazole; other; sulfa antibiotics; tape; and wound dressing adhesive.  MEDICATIONS:  Current Outpatient Medications  Medication Sig Dispense Refill   acetaminophen (TYLENOL) 500 MG tablet Take 1,000 mg by mouth daily. 500 mg tablets     brimonidine (ALPHAGAN) 0.2 % ophthalmic solution Per pt takes  Mid Day  1   Calcium Citrate-Vitamin D 200-125 MG-UNIT TABS Take 1 tablet by mouth 2 (two) times daily.     cholecalciferol (VITAMIN D)  25 MCG (1000 UNIT) tablet Take 2 tablets by mouth daily.     clobetasol cream (TEMOVATE) 0.05 % Apply topically.     cyanocobalamin (,VITAMIN B-12,) 1000 MCG/ML injection INJECT 1 MILLILITER INTO MUSCLE EVERY 30 DAYS 3 mL 2   dorzolamide-timolol (COSOPT) 22.3-6.8 MG/ML ophthalmic solution Place 1 drop into both eyes every 12 (twelve) hours. Per pt take Mid day     gabapentin (NEURONTIN) 300 MG capsule Take 300 mg by mouth 2 (two) times daily.     Melatonin 10 MG TBCR Take 10 mg by mouth at bedtime as needed for sleep.     Multiple Vitamins-Minerals (ONE-A-DAY WOMENS 50+ ADVANTAGE) TABS Take 1 tablet by mouth daily.     No current facility-administered medications for this visit.   Facility-Administered Medications Ordered in Other Visits  Medication Dose Route Frequency Provider Last Rate Last Admin   acetaminophen (TYLENOL) tablet 650 mg  650 mg Oral Once Carlton Adam, PA-C        SURGICAL HISTORY:  Past Surgical History:  Procedure Laterality Date   BREAST EXCISIONAL BIOPSY Right    Crohn  2009   internal ostomy pouch  1994   INTRAMEDULLARY (IM) NAIL INTERTROCHANTERIC Left 03/05/2016   Procedure: INTRAMEDULLARY (IM) NAIL INTERTROCHANTRIC;  Surgeon: Nicholes Stairs, MD;  Location: [place];  Service: Orthopedics;  Laterality: Left;   REDUCTION MAMMAPLASTY Bilateral 01/2017   TOTAL COLECTOMY  1984   due to Crohn's    REVIEW OF SYSTEMS:  A  comprehensive review of systems was negative.   PHYSICAL EXAMINATION: General appearance: alert, cooperative, and no distress Head: Normocephalic, without obvious abnormality, atraumatic Neck: no adenopathy, no JVD, supple, symmetrical, trachea midline, and thyroid not enlarged, symmetric, no tenderness/mass/nodules Lymph nodes: Cervical, supraclavicular, and axillary nodes normal. Resp: clear to auscultation bilaterally Back: symmetric, no curvature. ROM normal. No CVA tenderness. Cardio: regular rate and rhythm, S1, S2 normal, no murmur,  click, rub or gallop GI: soft, non-tender; bowel sounds normal; no masses,  no organomegaly Extremities: extremities normal, atraumatic, no cyanosis or edema  ECOG PERFORMANCE STATUS: 0 - Asymptomatic  Blood pressure (!) 112/43, pulse (!) 57, temperature (!) 96.4 F (35.8 C), temperature source Tympanic, resp. rate 18, height 4' 8"  (1.422 m), weight 88 lb 4.8 oz (40.1 kg), SpO2 100 %.  LABORATORY DATA: Lab Results  Component Value Date   WBC 5.0 01/06/2021   HGB 10.5 (L) 01/06/2021   HCT 32.5 (L) 01/06/2021   MCV 99.1 01/06/2021   PLT  01/06/2021    PLATELETS APPEAR SLIGHTLY DECREASED, SOME CLUMPING NOTED      Chemistry      Component Value Date/Time   NA 142 01/06/2021 1138   NA 140 11/17/2016 1134   K 4.7 01/06/2021 1138   K 3.9 11/17/2016 1134   CL 108 01/06/2021 1138   CO2 25 01/06/2021 1138   CO2 22 11/17/2016 1134   BUN 25 (H) 01/06/2021 1138   BUN 29.7 (H) 11/17/2016 1134   CREATININE 1.48 (H) 01/06/2021 1138   CREATININE 1.4 (H) 11/17/2016 1134      Component Value Date/Time   CALCIUM 9.4 01/06/2021 1138   CALCIUM 10.0 11/17/2016 1134   ALKPHOS 51 01/06/2021 1138   ALKPHOS 74 11/17/2016 1134   AST 24 01/06/2021 1138   AST 21 11/17/2016 1134   ALT 27 01/06/2021 1138   ALT 19 11/17/2016 1134   BILITOT 0.4 01/06/2021 1138   BILITOT 0.36 11/17/2016 1134        RADIOGRAPHIC STUDIES: No results found.   ASSESSMENT AND PLAN:  This is a very pleasant 80 years old white female with history of iron deficiency anemia/anemia of chronic disease secondary to malabsorption and Crohn's disease. The patient require Feraheme infusion at intermittent basis because of her blood loss from the colostomy as well as the malabsorption from Crohn's disease. The patient is currently on vitamin B12 supplement and she is feeling fine. For the last 3 years the patient has been doing very well with no concerning complaints or requirement for blood transfusion or iron infusion. The  patient had repeat CBC today that showed mild anemia with hemoglobin of 10.2.  Platelets count were difficult to estimate because of the clumping. The patient is asymptomatic. Iron study, ferritin, vitamin B12 are still pending. I recommended for the patient to continue on observation with repeat anemia panel in 6 months. For the history of Crohn's disease, she is followed by Dr. Emelda Fear at [facility]. She was advised to call immediately if she has any other concerning symptoms in the interval.  The patient voices understanding of current disease status and treatment options and is in agreement with the current care plan. All questions were answered. The patient knows to call the clinic with any problems, questions or concerns. We can certainly see the patient much sooner if necessary.  Disclaimer: This note was dictated with voice recognition software. Similar sounding words can inadvertently be transcribed and may be missed upon review.

## 2021-04-21 DIAGNOSIS — M810 Age-related osteoporosis without current pathological fracture: Secondary | ICD-10-CM | POA: Diagnosis not present

## 2021-04-25 ENCOUNTER — Other Ambulatory Visit: Payer: Medicare PPO

## 2021-05-04 DIAGNOSIS — H401133 Primary open-angle glaucoma, bilateral, severe stage: Secondary | ICD-10-CM | POA: Diagnosis not present

## 2021-05-04 DIAGNOSIS — H35371 Puckering of macula, right eye: Secondary | ICD-10-CM | POA: Diagnosis not present

## 2021-05-04 DIAGNOSIS — Z961 Presence of intraocular lens: Secondary | ICD-10-CM | POA: Diagnosis not present

## 2021-05-04 DIAGNOSIS — H209 Unspecified iridocyclitis: Secondary | ICD-10-CM | POA: Diagnosis not present

## 2021-05-14 ENCOUNTER — Other Ambulatory Visit: Payer: Self-pay | Admitting: Geriatric Medicine

## 2021-05-14 ENCOUNTER — Other Ambulatory Visit: Payer: Self-pay | Admitting: Internal Medicine

## 2021-05-14 DIAGNOSIS — Z1231 Encounter for screening mammogram for malignant neoplasm of breast: Secondary | ICD-10-CM

## 2021-05-16 ENCOUNTER — Ambulatory Visit
Admission: RE | Admit: 2021-05-16 | Discharge: 2021-05-16 | Disposition: A | Discharge status: Home / Self Care | Payer: Medicare PPO | Source: Ambulatory Visit | Attending: Internal Medicine | Admitting: Internal Medicine

## 2021-05-16 ENCOUNTER — Other Ambulatory Visit: Payer: Self-pay

## 2021-05-16 DIAGNOSIS — K50818 Crohn's disease of both small and large intestine with other complication: Secondary | ICD-10-CM

## 2021-05-24 DIAGNOSIS — H401133 Primary open-angle glaucoma, bilateral, severe stage: Secondary | ICD-10-CM | POA: Diagnosis not present

## 2021-06-18 DIAGNOSIS — L7211 Pilar cyst: Secondary | ICD-10-CM | POA: Diagnosis not present

## 2021-06-18 DIAGNOSIS — L821 Other seborrheic keratosis: Secondary | ICD-10-CM | POA: Diagnosis not present

## 2021-06-18 DIAGNOSIS — Z85828 Personal history of other malignant neoplasm of skin: Secondary | ICD-10-CM | POA: Diagnosis not present

## 2021-06-18 DIAGNOSIS — L853 Xerosis cutis: Secondary | ICD-10-CM | POA: Diagnosis not present

## 2021-06-18 DIAGNOSIS — L900 Lichen sclerosus et atrophicus: Secondary | ICD-10-CM | POA: Diagnosis not present

## 2021-06-18 DIAGNOSIS — L720 Epidermal cyst: Secondary | ICD-10-CM | POA: Diagnosis not present

## 2021-06-18 DIAGNOSIS — D1722 Benign lipomatous neoplasm of skin and subcutaneous tissue of left arm: Secondary | ICD-10-CM | POA: Diagnosis not present

## 2021-06-18 DIAGNOSIS — L814 Other melanin hyperpigmentation: Secondary | ICD-10-CM | POA: Diagnosis not present

## 2021-06-18 DIAGNOSIS — D2261 Melanocytic nevi of right upper limb, including shoulder: Secondary | ICD-10-CM | POA: Diagnosis not present

## 2021-06-20 ENCOUNTER — Encounter: Payer: Self-pay | Admitting: Internal Medicine

## 2021-06-20 NOTE — Progress Notes (Signed)
Subjective:    Patient ID: Kim [name], female    DOB: April 01, 1941, 81 y.o.   MRN: [id number]  This visit occurred during the SARS-CoV-2 public health emergency.  Safety protocols were in place, including screening questions prior to the visit, additional usage of staff PPE, and extensive cleaning of exam room while observing appropriate contact time as indicated for disinfecting solutions.     HPI She is here to establish with a new pcp.  The patient is here for follow up of their chronic medical problems, including crohn's disease ( severe, s/p resection), chronic anemia ( iron, B12), OP, CKD  Sees GI at [facility].    She exercises with trainer 2/ week.  She exercises the other days at home.    Has a mild ankle wound - ran into something.  Itchy sensitive skin - gabapentin   Medications and allergies reviewed with patient and updated if appropriate.  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   Wound of ankle, right, initial encounter 06/21/2021   Pruritus, sensitive skin 06/21/2021   Thrombocytopenia ([place]) 09/24/2020   CKD (chronic kidney disease) stage 3, GFR 30-59 ml/min (HCC) 02/01/2019   Postoperative breast asymmetry 03/20/2018   Sialadenitis 03/15/2016   Parotiditis    Hip fracture ([place]) 03/05/2016   Protein calorie malnutrition ([place]) 03/05/2016   Lung nodule 03/05/2016   Osteoporosis 03/18/2013   Glaucoma 03/18/2013   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 500 mg tablets     brimonidine (ALPHAGAN) 0.2 % ophthalmic solution Per pt takes  Mid Day  1   brimonidine (ALPHAGAN) 0.2 % ophthalmic  solution Place 1 drop into both eyes 3 times daily.     Calcium Citrate-Vitamin D 200-125 MG-UNIT TABS Take 1 tablet by mouth 2 (two) times daily.     cholecalciferol (VITAMIN D) 25 MCG (1000 UNIT) tablet Take 2 tablets by mouth daily.     clobetasol cream (TEMOVATE) 0.05 % Apply topically.     cyanocobalamin (,VITAMIN B-12,) 1000 MCG/ML injection INJECT 1 MILLILITER INTO MUSCLE EVERY 30 DAYS 3 mL 2   denosumab (PROLIA) 60 MG/ML SOSY injection Inject 60 mg into the skin every 6 (six) months.     dorzolamide-timolol (COSOPT) 22.3-6.8 MG/ML ophthalmic solution Place 1 drop into both eyes every 12 (twelve) hours. Per pt take Mid day     gabapentin (NEURONTIN) 300 MG capsule Take 300 mg by mouth 2 (two) times daily.     Melatonin 10 MG TBCR Take 10 mg by mouth at bedtime as needed for sleep.     Multiple Vitamins-Minerals (ONE-A-DAY WOMENS 50+ ADVANTAGE) TABS Take 1 tablet by mouth daily.     SYRINGE-NEEDLE, DISP, 3 ML (B-D 3CC LUER-LOK SYR 25GX5/8") 25G X 5/8" 3 ML MISC      triamcinolone cream (KENALOG) 0.1 % Apply 1 application topically 2 (two) times daily.     [DISCONTINUED] potassium chloride 40 MEQ/15ML (20%) LIQD Take 15 mLs (40 mEq total) by mouth as directed. 150 mL 0   No current facility-administered medications on file prior to visit.    Past  Medical History:  Diagnosis Date   Anemia    related to malabsorption--gets B12 shots and iron infusions, managed by Dr. Julien Nordmann   [facility] (basal cell carcinoma of skin)    Crohn disease ([place])    Dr. Emelda Fear at [facility]   Glaucoma    Dr. Janyth Contes   Intestinal malabsorption    OA (osteoarthritis)    hands,back   Osteoporosis    managed by Dr. Nevada Crane at [place], [place] q2 yrs    Past Surgical History:  Procedure Laterality Date   BREAST EXCISIONAL BIOPSY Right    Crohn  2009   internal ostomy pouch  1994   INTRAMEDULLARY (IM) NAIL INTERTROCHANTERIC Left 03/05/2016   Procedure: INTRAMEDULLARY (IM) NAIL INTERTROCHANTRIC;  Surgeon: Nicholes Stairs,  MD;  Location: [place];  Service: Orthopedics;  Laterality: Left;   REDUCTION MAMMAPLASTY Bilateral 01/2017   TOTAL COLECTOMY  1984   due to Crohn's    Social History   Socioeconomic History   Marital status: Married    Spouse name: Not on file   Number of children: 2   Years of education: Not on file   Highest education level: Not on file  Occupational History   Occupation: attorney, 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  Vaping Use   Vaping Use: Never used  Substance and Sexual Activity   Alcohol use: No   Drug use: No   Sexual activity: Not Currently  Other Topics Concern   Not on file  Social History Narrative   Lives with her husband, no pets.  Children in [place].  Husband has Parkinsons Disease   Social Determinants of Radio broadcast assistant Strain: Not on file  Food Insecurity: Not on file  Transportation Needs: Not on file  Physical Activity: Not on file  Stress: Not on file  Social Connections: Not on file    Family History  Problem Relation Age of Onset   Diabetes Mother        borderline   Heart disease Mother        MI   Heart disease Father    Thyroid disease Sister     Review of Systems  Constitutional:  Negative for fatigue and fever.  HENT:  Positive for voice change (from GERD).   Respiratory:  Negative for cough, shortness of breath and wheezing.   Cardiovascular:  Negative for chest pain, palpitations and leg swelling.  Gastrointestinal:  Negative for abdominal pain.       Gerd  Neurological:  Positive for dizziness (h/o - not in a long time). Negative for light-headedness and headaches.  Psychiatric/Behavioral:  Negative for dysphoric mood. The patient is nervous/anxious.       Objective:   Vitals:   06/21/21 1405  BP: (!)  120/58  Pulse: 62  Temp: 98 F (36.7 C)  SpO2: 98%   BP Readings from Last 3 Encounters:  06/21/21 (!) 120/58  04/14/21 (!) 112/43  01/06/21 (!) 129/44   Wt Readings from Last 3 Encounters:  06/21/21 92 lb (41.7 kg)  04/14/21 88 lb 4.8 oz (40.1 kg)  01/06/21 89 lb 1.6 oz (40.4 kg)   Body mass index is 20.63 kg/m.   Physical Exam    Constitutional:  Appears well-developed and well-nourished. No distress.  HENT:  Head: Normocephalic and atraumatic.  Neck: Neck supple. No tracheal deviation present. No thyromegaly present.  No cervical lymphadenopathy Cardiovascular: Normal rate, regular rhythm and normal heart sounds.   No murmur heard. No carotid bruit .  No edema Pulmonary/Chest: Effort normal and breath sounds normal. No respiratory distress. No has no wheezes. No rales.  Abdomen: soft, NT, ND Skin: Skin is warm and dry. Not diaphoretic. Nickel sized scabbed area on right medial ankle w/o surrounding erythema or swelling - minimal tenderness Psychiatric: Normal mood and affect. Behavior is normal.      Assessment & Plan:    See Problem List for Assessment and Plan of chronic medical problems.

## 2021-06-21 ENCOUNTER — Ambulatory Visit: Payer: Medicare PPO | Admitting: Internal Medicine

## 2021-06-21 ENCOUNTER — Other Ambulatory Visit: Payer: Self-pay

## 2021-06-21 VITALS — BP 120/58 | HR 62 | Temp 98.0°F | Ht <= 58 in | Wt 92.0 lb

## 2021-06-21 DIAGNOSIS — G622 Polyneuropathy due to other toxic agents: Secondary | ICD-10-CM

## 2021-06-21 DIAGNOSIS — H409 Unspecified glaucoma: Secondary | ICD-10-CM

## 2021-06-21 DIAGNOSIS — K50813 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: Secondary | ICD-10-CM | POA: Diagnosis not present

## 2021-06-21 DIAGNOSIS — Z23 Encounter for immunization: Secondary | ICD-10-CM | POA: Diagnosis not present

## 2021-06-21 DIAGNOSIS — N1832 Chronic kidney disease, stage 3b: Secondary | ICD-10-CM

## 2021-06-21 DIAGNOSIS — D638 Anemia in other chronic diseases classified elsewhere: Secondary | ICD-10-CM | POA: Diagnosis not present

## 2021-06-21 DIAGNOSIS — Z932 Ileostomy status: Secondary | ICD-10-CM | POA: Insufficient documentation

## 2021-06-21 DIAGNOSIS — S91001A Unspecified open wound, right ankle, initial encounter: Secondary | ICD-10-CM | POA: Diagnosis not present

## 2021-06-21 DIAGNOSIS — M810 Age-related osteoporosis without current pathological fracture: Secondary | ICD-10-CM | POA: Diagnosis not present

## 2021-06-21 DIAGNOSIS — L299 Pruritus, unspecified: Secondary | ICD-10-CM | POA: Insufficient documentation

## 2021-06-21 NOTE — Assessment & Plan Note (Addendum)
Chronic Following with [facility] neph[facility] - Dr Shearon Stalls Has been stable

## 2021-06-21 NOTE — Assessment & Plan Note (Signed)
Acute Scabbed over wound right medial ankle No evidence of infectioin Keeping area covered - monitor for infection tdap today

## 2021-06-21 NOTE — Assessment & Plan Note (Signed)
Chronic Following with Dr Julien Nordmann stable

## 2021-06-21 NOTE — Patient Instructions (Addendum)
° °  Tetanus given today.    Medications changes include :   none   Please followup in 6 months    [facility] is part of [facility] - Dr Marcine Matar

## 2021-06-21 NOTE — Assessment & Plan Note (Signed)
Chronic Sees Dr Emelda Fear at [facility] S/p colectomy  - has ileostomy

## 2021-06-21 NOTE — Assessment & Plan Note (Signed)
Chronic Sees ophthalmologist - Dr Edilia Bo with [facility]

## 2021-06-21 NOTE — Assessment & Plan Note (Signed)
Chronic  Related to Drug she took for Crohn's effects her balance -uses a cane Exercises regularly

## 2021-06-21 NOTE — Assessment & Plan Note (Signed)
Chronic Itchy and sensitive skin Taking gabapentin 300 mg twice a day, which helps

## 2021-06-21 NOTE — Assessment & Plan Note (Signed)
Chronic Managed by Dr Emelda Fear - [facility] On prolia Q 6 months - gets infusion at infusion center near [facility] Exercises regularly Taking calcium and vitamin d daily

## 2021-06-23 ENCOUNTER — Ambulatory Visit: Payer: Medicare PPO

## 2021-06-23 ENCOUNTER — Other Ambulatory Visit: Payer: Self-pay

## 2021-06-23 ENCOUNTER — Ambulatory Visit
Admission: RE | Admit: 2021-06-23 | Discharge: 2021-06-23 | Disposition: A | Discharge status: Home / Self Care | Payer: Medicare PPO | Source: Ambulatory Visit | Attending: Internal Medicine | Admitting: Internal Medicine

## 2021-06-23 DIAGNOSIS — Z1231 Encounter for screening mammogram for malignant neoplasm of breast: Secondary | ICD-10-CM | POA: Diagnosis not present

## 2021-07-07 ENCOUNTER — Other Ambulatory Visit: Payer: Self-pay | Admitting: Internal Medicine

## 2021-07-07 ENCOUNTER — Other Ambulatory Visit: Payer: Self-pay

## 2021-07-07 ENCOUNTER — Ambulatory Visit
Admission: RE | Admit: 2021-07-07 | Discharge: 2021-07-07 | Disposition: A | Discharge status: Home / Self Care | Payer: Medicare PPO | Source: Ambulatory Visit | Attending: Internal Medicine | Admitting: Internal Medicine

## 2021-07-07 DIAGNOSIS — K50818 Crohn's disease of both small and large intestine with other complication: Secondary | ICD-10-CM

## 2021-07-07 DIAGNOSIS — Z932 Ileostomy status: Secondary | ICD-10-CM | POA: Diagnosis not present

## 2021-07-07 DIAGNOSIS — N838 Other noninflammatory disorders of ovary, fallopian tube and broad ligament: Secondary | ICD-10-CM | POA: Diagnosis not present

## 2021-07-07 MED ORDER — GLUCAGON HCL RDNA (DIAGNOSTIC) 1 MG IJ SOLR
1.0000 mg | Freq: Once | INTRAMUSCULAR | Status: AC
  Administered 2021-07-07: 1 mg via INTRAMUSCULAR

## 2021-07-07 MED ORDER — GADOBENATE DIMEGLUMINE 529 MG/ML IV SOLN
7.0000 mL | Freq: Once | INTRAVENOUS | Status: AC | PRN
  Administered 2021-07-07: 7 mL via INTRAVENOUS

## 2021-07-29 DIAGNOSIS — N1832 Chronic kidney disease, stage 3b: Secondary | ICD-10-CM | POA: Diagnosis not present

## 2021-08-25 ENCOUNTER — Telehealth: Payer: Self-pay | Admitting: Internal Medicine

## 2021-08-25 DIAGNOSIS — H401133 Primary open-angle glaucoma, bilateral, severe stage: Secondary | ICD-10-CM | POA: Diagnosis not present

## 2021-08-25 DIAGNOSIS — H401123 Primary open-angle glaucoma, left eye, severe stage: Secondary | ICD-10-CM | POA: Diagnosis not present

## 2021-08-25 DIAGNOSIS — M810 Age-related osteoporosis without current pathological fracture: Secondary | ICD-10-CM

## 2021-08-25 NOTE — Telephone Encounter (Signed)
Referral ordered

## 2021-08-25 NOTE — Telephone Encounter (Signed)
Message left for patient that referral had been completed and sent to referrals team. ?

## 2021-08-25 NOTE — Telephone Encounter (Signed)
Patient calling in ? ?Says due to her osteoporosis she would like referral sent to [facility] Dr. Jacelyn Pi  ? ?(P) [phone] ?(F) [phone] ? ?Please fu w/ patient & let her know when referral has been placed [phone] ?

## 2021-09-27 DIAGNOSIS — Z932 Ileostomy status: Secondary | ICD-10-CM | POA: Diagnosis not present

## 2021-09-27 DIAGNOSIS — Z9049 Acquired absence of other specified parts of digestive tract: Secondary | ICD-10-CM | POA: Diagnosis not present

## 2021-09-27 DIAGNOSIS — N183 Chronic kidney disease, stage 3 unspecified: Secondary | ICD-10-CM | POA: Diagnosis not present

## 2021-09-27 DIAGNOSIS — K50818 Crohn's disease of both small and large intestine with other complication: Secondary | ICD-10-CM | POA: Diagnosis not present

## 2021-09-27 DIAGNOSIS — E559 Vitamin D deficiency, unspecified: Secondary | ICD-10-CM | POA: Diagnosis not present

## 2021-10-11 ENCOUNTER — Other Ambulatory Visit: Payer: Self-pay | Admitting: Medical Oncology

## 2021-10-11 DIAGNOSIS — D500 Iron deficiency anemia secondary to blood loss (chronic): Secondary | ICD-10-CM

## 2021-10-13 ENCOUNTER — Inpatient Hospital Stay: Payer: Medicare PPO | Admitting: Internal Medicine

## 2021-10-13 ENCOUNTER — Encounter: Payer: Self-pay | Admitting: Internal Medicine

## 2021-10-13 ENCOUNTER — Inpatient Hospital Stay: Payer: Medicare PPO | Attending: Internal Medicine

## 2021-10-13 ENCOUNTER — Other Ambulatory Visit: Payer: Self-pay

## 2021-10-13 VITALS — BP 137/42 | HR 59 | Temp 96.4°F | Resp 17 | Wt 91.2 lb

## 2021-10-13 DIAGNOSIS — K50919 Crohn's disease, unspecified, with unspecified complications: Secondary | ICD-10-CM | POA: Insufficient documentation

## 2021-10-13 DIAGNOSIS — Z85828 Personal history of other malignant neoplasm of skin: Secondary | ICD-10-CM | POA: Insufficient documentation

## 2021-10-13 DIAGNOSIS — Z79899 Other long term (current) drug therapy: Secondary | ICD-10-CM | POA: Diagnosis not present

## 2021-10-13 DIAGNOSIS — M1990 Unspecified osteoarthritis, unspecified site: Secondary | ICD-10-CM | POA: Insufficient documentation

## 2021-10-13 DIAGNOSIS — M810 Age-related osteoporosis without current pathological fracture: Secondary | ICD-10-CM | POA: Diagnosis not present

## 2021-10-13 DIAGNOSIS — K909 Intestinal malabsorption, unspecified: Secondary | ICD-10-CM | POA: Diagnosis not present

## 2021-10-13 DIAGNOSIS — R944 Abnormal results of kidney function studies: Secondary | ICD-10-CM | POA: Diagnosis not present

## 2021-10-13 DIAGNOSIS — D500 Iron deficiency anemia secondary to blood loss (chronic): Secondary | ICD-10-CM

## 2021-10-13 DIAGNOSIS — D539 Nutritional anemia, unspecified: Secondary | ICD-10-CM

## 2021-10-13 DIAGNOSIS — D638 Anemia in other chronic diseases classified elsewhere: Secondary | ICD-10-CM

## 2021-10-13 DIAGNOSIS — D509 Iron deficiency anemia, unspecified: Secondary | ICD-10-CM | POA: Insufficient documentation

## 2021-10-13 LAB — IRON AND IRON BINDING CAPACITY (CC-WL,HP ONLY)
Iron: 110 ug/dL (ref 28–170)
Saturation Ratios: 41 % — ABNORMAL HIGH (ref 10.4–31.8)
TIBC: 272 ug/dL (ref 250–450)
UIBC: 162 ug/dL (ref 148–442)

## 2021-10-13 LAB — CBC WITH DIFFERENTIAL (CANCER CENTER ONLY)
Abs Immature Granulocytes: 0.01 10*3/uL (ref 0.00–0.07)
Basophils Absolute: 0 10*3/uL (ref 0.0–0.1)
Basophils Relative: 0 %
Eosinophils Absolute: 0.1 10*3/uL (ref 0.0–0.5)
Eosinophils Relative: 2 %
HCT: 33 % — ABNORMAL LOW (ref 36.0–46.0)
Hemoglobin: 10.8 g/dL — ABNORMAL LOW (ref 12.0–15.0)
Immature Granulocytes: 0 %
Lymphocytes Relative: 19 %
Lymphs Abs: 0.9 10*3/uL (ref 0.7–4.0)
MCH: 32.8 pg (ref 26.0–34.0)
MCHC: 32.7 g/dL (ref 30.0–36.0)
MCV: 100.3 fL — ABNORMAL HIGH (ref 80.0–100.0)
Monocytes Absolute: 0.3 10*3/uL (ref 0.1–1.0)
Monocytes Relative: 7 %
Neutro Abs: 3.4 10*3/uL (ref 1.7–7.7)
Neutrophils Relative %: 72 %
Platelet Count: UNDETERMINED 10*3/uL (ref 150–400)
RBC: 3.29 MIL/uL — ABNORMAL LOW (ref 3.87–5.11)
RDW: 12.9 % (ref 11.5–15.5)
WBC Count: 4.8 10*3/uL (ref 4.0–10.5)
nRBC: 0 % (ref 0.0–0.2)

## 2021-10-13 LAB — CMP (CANCER CENTER ONLY)
ALT: 28 U/L (ref 0–44)
AST: 28 U/L (ref 15–41)
Albumin: 3.7 g/dL (ref 3.5–5.0)
Alkaline Phosphatase: 48 U/L (ref 38–126)
Anion gap: 5 (ref 5–15)
BUN: 28 mg/dL — ABNORMAL HIGH (ref 8–23)
CO2: 28 mmol/L (ref 22–32)
Calcium: 9.2 mg/dL (ref 8.9–10.3)
Chloride: 108 mmol/L (ref 98–111)
Creatinine: 1.51 mg/dL — ABNORMAL HIGH (ref 0.44–1.00)
GFR, Estimated: 35 mL/min — ABNORMAL LOW (ref >60)
Glucose, Bld: 66 mg/dL — ABNORMAL LOW (ref 70–99)
Potassium: 3.7 mmol/L (ref 3.5–5.1)
Sodium: 141 mmol/L (ref 135–145)
Total Bilirubin: 0.5 mg/dL (ref 0.3–1.2)
Total Protein: 7.2 g/dL (ref 6.5–8.1)

## 2021-10-13 LAB — FERRITIN: Ferritin: 711 ng/mL — ABNORMAL HIGH (ref 11–307)

## 2021-10-13 LAB — VITAMIN B12: Vitamin B-12: 508 pg/mL (ref 180–914)

## 2021-10-13 NOTE — Progress Notes (Signed)
?    [facility] ?Telephone:(336) [phone]   [phone] ? ?OFFICE PROGRESS NOTE ? ?Binnie Rail, MD ?[place] [address] ? 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  ? ?PRIOR THERAPY:  ?1) PRBCs transfusion as needed last was given few days ago.  ? ?CURRENT THERAPY:  ?1) Intravenous Feraheme infusion on as-needed basis. Last dose was given on last infusion was on 09/18/2015 ?2) vitamin B12 injection on monthly basis. ? ?INTERVAL HISTORY: ?Kim [name] 81 y.o. female returns to the clinic today for 60-monthfollow-up visit.  The patient is feeling fine today with no concerning complaints.  She is very active and goes to the gym at least twice a week.  She also has a pPhysiological scientist  She denied having any current chest pain, shortness of breath, cough or hemoptysis.  She has no nausea, vomiting, diarrhea or constipation.  She has been off the treatment for her Crohn's disease for several years now after her last surgery.  She is here today for evaluation with repeat blood work. ? ?MEDICAL HISTORY: ?Past Medical History:  ?Diagnosis Date  ? Anemia   ? related to malabsorption--gets B12 shots and iron infusions, managed by Dr. MJulien Nordmann ? BCC (basal cell carcinoma of skin)   ? [place]   [facility] ? Glaucoma   ? Dr. CJanyth Contes ? Intestinal malabsorption   ? OA (osteoarthritis)   ? hands,back  ? Osteoporosis   ? [place]  ? ? ?ALLERGIES:  is allergic to antihistamines, chlorpheniramine-type; chlorpheniramine; metronidazole; other; sulfa antibiotics; tape; and wound dressing adhesive. ? ?MEDICATIONS:  ?Current Outpatient Medications  ?Medication Sig Dispense Refill  ? acetaminophen (TYLENOL) 500 MG tablet Take 1,000 mg by mouth daily. 500 mg tablets    ? brimonidine (ALPHAGAN) 0.2 % ophthalmic solution Per pt takes  Mid Day  1  ? brimonidine  (ALPHAGAN) 0.2 % ophthalmic solution Place 1 drop into both eyes 3 times daily.    ? Calcium Citrate-Vitamin D 200-125 MG-UNIT TABS Take 1 tablet by mouth 2 (two) times daily.    ? cholecalciferol (VITAMIN D) 25 MCG (1000 UNIT) tablet Take 2 tablets by mouth daily.    ? clobetasol cream (TEMOVATE) 0.05 % Apply topically.    ? cyanocobalamin (,VITAMIN B-12,) 1000 MCG/ML injection INJECT 1 MILLILITER INTO MUSCLE EVERY 30 DAYS 3 mL 2  ? denosumab (PROLIA) 60 MG/ML SOSY injection Inject 60 mg into the skin every 6 (six) months.    ? dorzolamide-timolol (COSOPT) 22.3-6.8 MG/ML ophthalmic solution Place 1 drop into both eyes every 12 (twelve) hours. Per pt take Mid day    ? gabapentin (NEURONTIN) 300 MG capsule Take 300 mg by mouth 2 (two) times daily.    ? Melatonin 10 MG TBCR Take 10 mg by mouth at bedtime as needed for sleep.    ? Multiple Vitamins-Minerals (ONE-A-DAY WOMENS 50+ ADVANTAGE) TABS Take 1 tablet by mouth daily.    ? SYRINGE-NEEDLE, DISP, 3 ML (B-D 3CC LUER-LOK SYR 25GX5/8") 25G X 5/8" 3 ML MISC     ? triamcinolone cream (KENALOG) 0.1 % Apply 1 application topically 2 (two) times daily.    ? ?No current facility-administered medications for this visit.  ? ? ?SURGICAL HISTORY:  ?Past Surgical History:  ?Procedure Laterality Date  ? BREAST EXCISIONAL BIOPSY Right   ? Crohn  2009  ?  internal ostomy pouch  1994  ? INTRAMEDULLARY (IM) NAIL INTERTROCHANTERIC Left 03/05/2016  ? Procedure: INTRAMEDULLARY (IM) NAIL INTERTROCHANTRIC;  Surgeon: Nicholes Stairs, MD;  Location: [place];  Service: Orthopedics;  Laterality: Left;  ? REDUCTION MAMMAPLASTY Bilateral 01/2017  ? TOTAL COLECTOMY  1984  ? due to Crohn's  ? ? ?REVIEW OF SYSTEMS:  A comprehensive review of systems was negative.  ? ?PHYSICAL EXAMINATION: General appearance: alert, cooperative, and no distress ?Head: Normocephalic, without obvious abnormality, atraumatic ?Neck: no adenopathy, no JVD, supple, symmetrical, trachea midline, and thyroid not enlarged,  symmetric, no tenderness/mass/nodules ?Lymph nodes: Cervical, supraclavicular, and axillary nodes normal. ?Resp: clear to auscultation bilaterally ?Back: symmetric, no curvature. ROM normal. No CVA tenderness. ?Cardio: regular rate and rhythm, S1, S2 normal, no murmur, click, rub or gallop ?GI: soft, non-tender; bowel sounds normal; no masses,  no organomegaly ?Extremities: extremities normal, atraumatic, no cyanosis or edema ? ?ECOG PERFORMANCE STATUS: 0 - Asymptomatic ? ?Blood pressure (!) 137/42, pulse (!) 59, temperature (!) 96.4 ?F (35.8 ?C), temperature source Tympanic, resp. rate 17, weight 91 lb 4 oz (41.4 kg), SpO2 99 %. ? ?LABORATORY DATA: ?Lab Results  ?Component Value Date  ? WBC 4.8 10/13/2021  ? HGB 10.8 (L) 10/13/2021  ? HCT 33.0 (L) 10/13/2021  ? MCV 100.3 (H) 10/13/2021  ? PLT PLATELET CLUMPS NOTED ON SMEAR, UNABLE TO ESTIMATE 10/13/2021  ? ? ?  Chemistry   ?   ?Component Value Date/Time  ? NA 141 10/13/2021 1110  ? NA 140 11/17/2016 1134  ? K 3.7 10/13/2021 1110  ? K 3.9 11/17/2016 1134  ? CL 108 10/13/2021 1110  ? CO2 28 10/13/2021 1110  ? CO2 22 11/17/2016 1134  ? BUN 28 (H) 10/13/2021 1110  ? BUN 29.7 (H) 11/17/2016 1134  ? CREATININE 1.51 (H) 10/13/2021 1110  ? CREATININE 1.4 (H) 11/17/2016 1134  ?    ?Component Value Date/Time  ? CALCIUM 9.2 10/13/2021 1110  ? CALCIUM 10.0 11/17/2016 1134  ? ALKPHOS 48 10/13/2021 1110  ? ALKPHOS 74 11/17/2016 1134  ? AST 28 10/13/2021 1110  ? AST 21 11/17/2016 1134  ? ALT 28 10/13/2021 1110  ? ALT 19 11/17/2016 1134  ? BILITOT 0.5 10/13/2021 1110  ? BILITOT 0.36 11/17/2016 1134  ?  ? ? ?  ?RADIOGRAPHIC STUDIES: ?No results found.  ? ?ASSESSMENT AND PLAN:  ?This is a very pleasant 81 years old white female with history of iron deficiency anemia/anemia of chronic disease secondary to malabsorption and Crohn's disease. ?The patient require Feraheme infusion at intermittent basis because of her blood loss from the colostomy as well as the malabsorption from  Crohn's disease. ?The patient is currently on vitamin B12 supplement and she is feeling fine. ?For the last 4 years the patient has been doing very well with no concerning complaints or requirement for blood transfusion or iron infusion. ?The patient is feeling fine today with no concerning complaints.  Repeat CBC showed hemoglobin of 10.8 with hematocrit of 33.0%.  Comprehensive metabolic panel is unremarkable except for the elevated serum creatinine and she is followed by nephrology at [facility]. ?Iron study, ferritin and vitamin B12 level are still pending. ?I recommended for the patient to continue on observation with repeat blood work in 6 months.  If there is any concerning findings on the pending blood work we will call the patient with additional recommendation. ?For the history of Crohn's disease, she is followed by Dr. Emelda [facility] at [facility]. ?The patient was advised  to call immediately if she has any other concerning symptoms in the interval. ? ?The patient voices understanding of current disease status and treatment options and is in agreement with the current care plan. ?All questions were answered. The patient knows to call the clinic with any problems, questions or concerns. We can certainly see the patient much sooner if necessary. ? ?Disclaimer: This note was dictated with voice recognition software. Similar sounding words can inadvertently be transcribed and may be missed upon review. ? ? ?  ?  ?

## 2021-10-13 NOTE — Addendum Note (Signed)
Addended by: Ardeen Garland on: 10/13/2021 12:20 PM ? ? Modules accepted: Orders ? ?

## 2021-10-28 DIAGNOSIS — H401133 Primary open-angle glaucoma, bilateral, severe stage: Secondary | ICD-10-CM | POA: Diagnosis not present

## 2021-11-02 DIAGNOSIS — S72009A Fracture of unspecified part of neck of unspecified femur, initial encounter for closed fracture: Secondary | ICD-10-CM | POA: Diagnosis not present

## 2021-11-02 DIAGNOSIS — M810 Age-related osteoporosis without current pathological fracture: Secondary | ICD-10-CM | POA: Diagnosis not present

## 2021-11-02 DIAGNOSIS — K5090 Crohn's disease, unspecified, without complications: Secondary | ICD-10-CM | POA: Diagnosis not present

## 2021-11-02 DIAGNOSIS — N189 Chronic kidney disease, unspecified: Secondary | ICD-10-CM | POA: Diagnosis not present

## 2021-11-10 DIAGNOSIS — H401133 Primary open-angle glaucoma, bilateral, severe stage: Secondary | ICD-10-CM | POA: Diagnosis not present

## 2021-11-18 DIAGNOSIS — M810 Age-related osteoporosis without current pathological fracture: Secondary | ICD-10-CM | POA: Diagnosis not present

## 2021-12-21 ENCOUNTER — Encounter: Payer: Self-pay | Admitting: Internal Medicine

## 2021-12-21 NOTE — Progress Notes (Unsigned)
Subjective:    Patient ID: Kim [name], female    DOB: Jul 06, 1940, 81 y.o.   MRN: [id number]      HPI Kim [name] is here for a Physical exam.   She denies any major changes in her health since she was here last.  Her only concern is where she needs to go if she does have an emergency at night on the weekends.   Medications and allergies reviewed with patient and updated if appropriate.  Current Outpatient Medications on File Prior to Visit  Medication Sig Dispense Refill   acetaminophen (TYLENOL) 500 MG tablet Take 1,000 mg by mouth daily. 500 mg tablets     brimonidine (ALPHAGAN) 0.2 % ophthalmic solution Place 1 drop into both eyes 3 times daily.     Calcium Citrate-Vitamin D 200-125 MG-UNIT TABS Take 1 tablet by mouth 2 (two) times daily.     cholecalciferol (VITAMIN D) 25 MCG (1000 UNIT) tablet Take 2 tablets by mouth daily.     clobetasol cream (TEMOVATE) 0.05 % Apply topically.     cyanocobalamin (,VITAMIN B-12,) 1000 MCG/ML injection INJECT 1 MILLILITER INTO MUSCLE EVERY 30 DAYS 3 mL 2   denosumab (PROLIA) 60 MG/ML SOSY injection Inject 60 mg into the skin every 6 (six) months.     gabapentin (NEURONTIN) 300 MG capsule Take 300 mg by mouth 2 (two) times daily.     Melatonin 10 MG TBCR Take 10 mg by mouth at bedtime as needed for sleep.     Multiple Vitamins-Minerals (ONE-A-DAY WOMENS 50+ ADVANTAGE) TABS Take 1 tablet by mouth daily.     ROCKLATAN 0.02-0.005 % SOLN Apply 1 drop to eye daily.     SYRINGE-NEEDLE, DISP, 3 ML (B-D 3CC LUER-LOK SYR 25GX5/8") 25G X 5/8" 3 ML MISC      triamcinolone cream (KENALOG) 0.1 % Apply 1 application topically 2 (two) times daily.     [DISCONTINUED] potassium chloride 40 MEQ/15ML (20%) LIQD Take 15 mLs (40 mEq total) by mouth as directed. 150 mL 0   No current facility-administered medications on file prior to visit.    Review of Systems  Constitutional:  Negative for chills and fever.  HENT:  Positive for voice change.   Eyes:   Positive for visual disturbance (related to glaucoma).  Respiratory:  Positive for cough. Negative for shortness of breath and wheezing.   Cardiovascular:  Negative for chest pain, palpitations and leg swelling.  Gastrointestinal:  Negative for abdominal pain and nausea.  Genitourinary:  Negative for dysuria.  Musculoskeletal:  Negative for arthralgias and back pain.       Leg cramps at times  Skin:  Negative for rash.       Itching - generalized  Neurological:  Positive for light-headedness (occ when gets up quickly). Negative for headaches.  Psychiatric/Behavioral:  Negative for dysphoric mood. The patient is not nervous/anxious.        Objective:   Vitals:   12/22/21 1324 12/22/21 1429  BP: 128/68 114/72  Pulse: 65   Temp: 98 F (36.7 C)   SpO2: 98%    Filed Weights   12/22/21 1324  Weight: 87 lb 9.6 oz (39.7 kg)   Body mass index is 19.64 kg/m.  BP Readings from Last 3 Encounters:  12/22/21 114/72  10/13/21 (!) 137/42  06/21/21 (!) 120/58    Wt Readings from Last 3 Encounters:  12/22/21 87 lb 9.6 oz (39.7 kg)  10/13/21 91 lb 4 oz (41.4 kg)  06/21/21 92 lb (  41.7 kg)       Physical Exam Constitutional: She appears well-developed and well-nourished. No distress.  HENT:  Head: Normocephalic and atraumatic.  Right Ear: External ear normal. Normal ear canal and TM Left Ear: External ear normal.  Normal ear canal and TM Mouth/Throat: Oropharynx is clear and moist.  Eyes: Conjunctivae normal.  Neck: Neck supple. No tracheal deviation present. No thyromegaly present.  No carotid bruit  Cardiovascular: Normal rate, regular rhythm and normal heart sounds.   No murmur heard.  No edema. Pulmonary/Chest: Effort normal and breath sounds normal. No respiratory distress. She has no wheezes. She has no rales.  Breast: deferred   Abdominal: Soft. She exhibits no distension. There is no tenderness.  Lymphadenopathy: She has no cervical adenopathy.  Skin: Skin is warm and  dry. She is not diaphoretic.  Psychiatric: She has a normal mood and affect. Her behavior is normal.     Lab Results  Component Value Date   WBC 4.8 10/13/2021   HGB 10.8 (L) 10/13/2021   HCT 33.0 (L) 10/13/2021   PLT PLATELET CLUMPS NOTED ON SMEAR, UNABLE TO ESTIMATE 10/13/2021   GLUCOSE 66 (L) 10/13/2021   CHOL 127 08/22/2011   TRIG 138 08/22/2011   HDL 35 (L) 08/22/2011   LDLCALC 64 08/22/2011   ALT 28 10/13/2021   AST 28 10/13/2021   NA 141 10/13/2021   K 3.7 10/13/2021   CL 108 10/13/2021   CREATININE 1.51 (H) 10/13/2021   BUN 28 (H) 10/13/2021   CO2 28 10/13/2021   TSH 4.162 08/22/2011   INR 1.09 03/05/2016         Assessment & Plan:   Physical exam: Screening blood work deferred Exercise  regular - Physiological scientist Weight  normal Eats a lot of fruits/veges - not as much protein as she should Substance abuse  none   Reviewed recommended immunizations.   Health Maintenance  Topic Date Due   Pneumonia Vaccine 53+ Years old (3 - PPSV23 or PCV20) 06/15/2017   COVID-19 Vaccine (7 - Pfizer series) 09/11/2020   INFLUENZA VACCINE  01/04/2022   TETANUS/TDAP  06/22/2031   DEXA SCAN  Completed   Zoster Vaccines- Shingrix  Completed   HPV VACCINES  Aged Out          See Problem List for Assessment and Plan of chronic medical problems.

## 2021-12-21 NOTE — Patient Instructions (Addendum)
Blood work was ordered.     Medications changes include :      Your prescription(s) have been sent to your pharmacy.    A referral was ordered for XX.     Someone from that office will call you to schedule an appointment.    No follow-ups on file.    Health Maintenance, Female Adopting a healthy lifestyle and getting preventive care are important in promoting health and wellness. Ask your health care provider about: The right schedule for you to have regular tests and exams. Things you can do on your own to prevent diseases and keep yourself healthy. What should I know about diet, weight, and exercise? Eat a healthy diet  Eat a diet that includes plenty of vegetables, fruits, low-fat dairy products, and lean protein. Do not eat a lot of foods that are high in solid fats, added sugars, or sodium. Maintain a healthy weight Body mass index (BMI) is used to identify weight problems. It estimates body fat based on height and weight. Your health care provider can help determine your BMI and help you achieve or maintain a healthy weight. Get regular exercise Get regular exercise. This is one of the most important things you can do for your health. Most adults should: Exercise for at least 150 minutes each week. The exercise should increase your heart rate and make you sweat (moderate-intensity exercise). Do strengthening exercises at least twice a week. This is in addition to the moderate-intensity exercise. Spend less time sitting. Even light physical activity can be beneficial. Watch cholesterol and blood lipids Have your blood tested for lipids and cholesterol at 80 years of age, then have this test every 5 years. Have your cholesterol levels checked more often if: Your lipid or cholesterol levels are high. You are older than 80 years of age. You are at high risk for heart disease. What should I know about cancer screening? Depending on your health history and family  history, you may need to have cancer screening at various ages. This may include screening for: Breast cancer. Cervical cancer. Colorectal cancer. Skin cancer. Lung cancer. What should I know about heart disease, diabetes, and high blood pressure? Blood pressure and heart disease High blood pressure causes heart disease and increases the risk of stroke. This is more likely to develop in people who have high blood pressure readings or are overweight. Have your blood pressure checked: Every 3-5 years if you are 65-64 years of age. Every year if you are 31 years old or older. Diabetes Have regular diabetes screenings. This checks your fasting blood sugar level. Have the screening done: Once every three years after age 27 if you are at a normal weight and have a low risk for diabetes. More often and at a younger age if you are overweight or have a high risk for diabetes. What should I know about preventing infection? Hepatitis B If you have a higher risk for hepatitis B, you should be screened for this virus. Talk with your health care provider to find out if you are at risk for hepatitis B infection. Hepatitis C Testing is recommended for: Everyone born from [birth year] through 1964. Anyone with known risk factors for hepatitis C. Sexually transmitted infections (STIs) Get screened for STIs, including gonorrhea and chlamydia, if: You are sexually active and are younger than 80 years of age. You are older than 80 years of age and your health care provider tells you that you are at risk  for this type of infection. Your sexual activity has changed since you were last screened, and you are at increased risk for chlamydia or gonorrhea. Ask your health care provider if you are at risk. Ask your health care provider about whether you are at high risk for HIV. Your health care provider may recommend a prescription medicine to help prevent HIV infection. If you choose to take medicine to prevent HIV, you  should first get tested for HIV. You should then be tested every 3 months for as long as you are taking the medicine. Pregnancy If you are about to stop having your period (premenopausal) and you may become pregnant, seek counseling before you get pregnant. Take 400 to 800 micrograms (mcg) of folic acid every day if you become pregnant. Ask for birth control (contraception) if you want to prevent pregnancy. Osteoporosis and menopause Osteoporosis is a disease in which the bones lose minerals and strength with aging. This can result in bone fractures. If you are 75 years old or older, or if you are at risk for osteoporosis and fractures, ask your health care provider if you should: Be screened for bone loss. Take a calcium or vitamin D supplement to lower your risk of fractures. Be given hormone replacement therapy (HRT) to treat symptoms of menopause. Follow these instructions at home: Alcohol use Do not drink alcohol if: Your health care provider tells you not to drink. You are pregnant, may be pregnant, or are planning to become pregnant. If you drink alcohol: Limit how much you have to: 0-1 drink a day. Know how much alcohol is in your drink. In the [place] one drink equals one 12 oz bottle of beer (355 mL), one 5 oz glass of wine (148 mL), or one 1 oz glass of hard liquor (44 mL). Lifestyle Do not use any products that contain nicotine or tobacco. These products include cigarettes, chewing tobacco, and vaping devices, such as e-cigarettes. If you need help quitting, ask your health care provider. Do not use street drugs. Do not share needles. Ask your health care provider for help if you need support or information about quitting drugs. General instructions Schedule regular health, dental, and eye exams. Stay current with your vaccines. Tell your health care provider if: You often feel depressed. You have ever been abused or do not feel safe at home. Summary Adopting a healthy  lifestyle and getting preventive care are important in promoting health and wellness. Follow your health care provider's instructions about healthy diet, exercising, and getting tested or screened for diseases. Follow your health care provider's instructions on monitoring your cholesterol and blood pressure. This information is not intended to replace advice given to you by your health care provider. Make sure you discuss any questions you have with your health care provider. Document Revised: 10/12/2020 Document Reviewed: 10/12/2020 Elsevier Patient Education  [place].

## 2021-12-22 ENCOUNTER — Ambulatory Visit (INDEPENDENT_AMBULATORY_CARE_PROVIDER_SITE_OTHER): Payer: Medicare PPO | Admitting: Internal Medicine

## 2021-12-22 VITALS — BP 114/72 | HR 65 | Temp 98.0°F | Ht <= 58 in | Wt 87.6 lb

## 2021-12-22 DIAGNOSIS — K50813 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: Secondary | ICD-10-CM | POA: Diagnosis not present

## 2021-12-22 DIAGNOSIS — H409 Unspecified glaucoma: Secondary | ICD-10-CM | POA: Diagnosis not present

## 2021-12-22 DIAGNOSIS — L299 Pruritus, unspecified: Secondary | ICD-10-CM

## 2021-12-22 DIAGNOSIS — G622 Polyneuropathy due to other toxic agents: Secondary | ICD-10-CM

## 2021-12-22 DIAGNOSIS — N1832 Chronic kidney disease, stage 3b: Secondary | ICD-10-CM

## 2021-12-22 DIAGNOSIS — M810 Age-related osteoporosis without current pathological fracture: Secondary | ICD-10-CM

## 2021-12-22 DIAGNOSIS — Z0000 Encounter for general adult medical examination without abnormal findings: Secondary | ICD-10-CM

## 2021-12-22 NOTE — Assessment & Plan Note (Signed)
Chronic Follows with nephrology at [place] function stable

## 2021-12-22 NOTE — Assessment & Plan Note (Addendum)
Stable Follows with [facility] eye department

## 2021-12-22 NOTE — Assessment & Plan Note (Signed)
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

## 2021-12-22 NOTE — Assessment & Plan Note (Signed)
Chronic Management per Dr Chalmers Cater On prolia

## 2021-12-22 NOTE — Assessment & Plan Note (Signed)
Chronic Unknown cause Itchy and sensative Taking gabapentin 300 mg bid

## 2021-12-22 NOTE — Assessment & Plan Note (Signed)
Chronic Dr Jeanne Ivan at [facility] Has ileostomy

## 2021-12-31 ENCOUNTER — Other Ambulatory Visit: Payer: Self-pay | Admitting: Internal Medicine

## 2022-01-18 ENCOUNTER — Ambulatory Visit: Payer: Self-pay | Admitting: Licensed Clinical Social Worker

## 2022-01-18 NOTE — Patient Instructions (Addendum)
Visit Information  Thank you for taking time to visit with me today. Please don't hesitate to contact me if I can be of assistance to you before our next scheduled telephone appointment.  Following are the goals we discussed today:   Our next appointment is by telephone on 01/31/22 at 3:00 PM   Please call the care guide team at [phone] if you need to cancel or reschedule your appointment.   If you are experiencing a Mental Health or [place] or need someone to talk to, please go to [facility] [place] [phone])   Following is a copy of your full plan of care:   Care Coordination Interventions:  Active listening / Reflection utilized  Emotional Support Provided Discussed Care Coordination program support with client Reviewed client needs. She has vision challenges, walking challenges. Enjoys support of PCP. She spoke of neuropathy issues Reviewed walking challenges. She exercises daily to help with walking. She uses a cane to help her walk   Ms. Klumb was given information about Care Management services by the embedded care coordination team including:  Care Management services include personalized support from designated clinical staff supervised by her physician, including individualized plan of care and coordination with other care providers 24/7 contact phone numbers for assistance for urgent and routine care needs. The patient may stop CCM services at any time (effective at the end of the month) by phone call to the office staff.  Patient agreed to services and verbal consent obtained.   Norva Riffle.Dayna Geurts MSW, [place] Holiday representative [facility] Care Management [phone]

## 2022-01-18 NOTE — Patient Outreach (Signed)
  Care Coordination   Initial Visit Note   01/18/2022 Name: Alajah Witman MRN: [id number] DOB: 07-07-1940  Ronne Savoia is a 81 y.o. year old female who sees Burns, Claudina Lick, MD for primary care. I spoke with  Lolita Patella by phone today  What matters to the patients health and wellness today?  Client is interested in Care coordination program support. She has vision challenges, walking challenges. She asked for LCSW to call her back in a few weeks to further discuss Care Coordination program support     Goals Addressed             This Visit's Progress    Patient is interested in Care Coordination program support. She asked for call back in a few weeks to further discuss program support.       Care Coordination Interventions:  Active listening / Reflection utilized  Emotional Support Provided Discussed Care Coordination program support with client Reviewed client needs. She has vision challenges, walking challenges. Enjoys support of PCP. She spoke of neuropathy issues Reviewed walking challenges. She exercises daily to help with walking. She uses a cane to help her walk     SDOH assessments and interventions completed:  Yes  SDOH Interventions Today    Flowsheet Row Most Recent Value  SDOH Interventions   Physical Activity Interventions Other (Comments)  [walking challenges she uses a cane to help her walk]  Stress Interventions Provide Counseling  [client has stress related to managing medical needs. She has vision challenges]        Care Coordination Interventions Activated:  Yes  Care Coordination Interventions:  Yes, provided   Follow up plan: Follow up call scheduled for 01/31/22 at 3:00 PM     Encounter Outcome:  Pt. Visit Completed

## 2022-01-31 ENCOUNTER — Ambulatory Visit: Payer: Self-pay | Admitting: Licensed Clinical Social Worker

## 2022-01-31 NOTE — Patient Outreach (Signed)
  Care Coordination   Follow Up Visit Note   01/31/2022 Name: Kim [name] MRN: [id number] DOB: August 22, 1940  Kim [name] is a 81 y.o. year old female who sees Burns, Claudina Lick, MD for primary care. I spoke with  Kim [name] by phone today.  What matters to the patients health and wellness today?  Declined further services at this time    Goals Addressed             This Visit's Progress    patient stated she appreciated information . She did not mention any further Care Coordiination needs at present       Care Coordination Interventions:   Active listening / Reflection utilized  Problem Solving [place] strategies reviewed Discussed with client the Care Coordination support program Discussed transport needs of client. Informed client that she could call [facility] in [place], [place] to talk with representative about transport costs for client Talked with client about RN support , LCSW support , and Pharmacist support with program.  Client appreciated information. She did not mention any further Care Coordination needs at present        SDOH assessments and interventions completed:  No     Care Coordination Interventions Activated:  Yes  Care Coordination Interventions:  Yes, provided   Follow up plan: No further intervention required.   Encounter Outcome:  Pt. Visit Completed

## 2022-01-31 NOTE — Patient Instructions (Addendum)
Visit Information  Thank you for taking time to visit with me today. Please don't hesitate to contact me if I can be of assistance to you before our next scheduled telephone appointment.  Following are the goals we discussed today:   No further intervention is needed at this time  Please call the care guide team at [phone] if you need to cancel or reschedule your appointment.   If you are experiencing a Mental Health or [place] or need someone to talk to, please go to [facility] [place] [phone])   Following is a copy of your full plan of care:   Care Coordination Interventions:  Active listening / Reflection utilized  Problem Solving [place] strategies reviewed Discussed with client the Care Coordination support program Discussed transport needs of client. Informed client that she could call [facility] in [place], [place] to talk with representative about transport costs for client Talked with client about RN support , LCSW support , and Pharmacist support with program.  Client appreciated information. She did not mention any further Care Coordination needs at present  Ms. Bow was given information about Care Management services by the embedded care coordination team including:  Care Management services include personalized support from designated clinical staff supervised by her physician, including individualized plan of care and coordination with other care providers 24/7 contact phone numbers for assistance for urgent and routine care needs. The patient may stop CCM services at any time (effective at the end of the month) by phone call to the office staff.  Patient agreed to services and verbal consent obtained.   Norva Riffle.Clearnce Leja MSW, [place] Holiday representative [facility] Care Management [phone]

## 2022-02-14 DIAGNOSIS — H401133 Primary open-angle glaucoma, bilateral, severe stage: Secondary | ICD-10-CM | POA: Diagnosis not present

## 2022-02-21 DIAGNOSIS — Z85828 Personal history of other malignant neoplasm of skin: Secondary | ICD-10-CM | POA: Diagnosis not present

## 2022-02-21 DIAGNOSIS — L900 Lichen sclerosus et atrophicus: Secondary | ICD-10-CM | POA: Diagnosis not present

## 2022-02-21 DIAGNOSIS — D1801 Hemangioma of skin and subcutaneous tissue: Secondary | ICD-10-CM | POA: Diagnosis not present

## 2022-02-21 DIAGNOSIS — B009 Herpesviral infection, unspecified: Secondary | ICD-10-CM | POA: Diagnosis not present

## 2022-02-21 DIAGNOSIS — L309 Dermatitis, unspecified: Secondary | ICD-10-CM | POA: Diagnosis not present

## 2022-03-17 DIAGNOSIS — L940 Localized scleroderma [morphea]: Secondary | ICD-10-CM | POA: Diagnosis not present

## 2022-03-17 DIAGNOSIS — L900 Lichen sclerosus et atrophicus: Secondary | ICD-10-CM | POA: Diagnosis not present

## 2022-03-17 DIAGNOSIS — Z85828 Personal history of other malignant neoplasm of skin: Secondary | ICD-10-CM | POA: Diagnosis not present

## 2022-04-11 DIAGNOSIS — E559 Vitamin D deficiency, unspecified: Secondary | ICD-10-CM | POA: Diagnosis not present

## 2022-04-11 DIAGNOSIS — Z9049 Acquired absence of other specified parts of digestive tract: Secondary | ICD-10-CM | POA: Diagnosis not present

## 2022-04-11 DIAGNOSIS — K50818 Crohn's disease of both small and large intestine with other complication: Secondary | ICD-10-CM | POA: Diagnosis not present

## 2022-04-20 ENCOUNTER — Inpatient Hospital Stay: Payer: Medicare PPO | Attending: Internal Medicine

## 2022-04-20 ENCOUNTER — Inpatient Hospital Stay: Payer: Medicare PPO | Admitting: Internal Medicine

## 2022-04-20 DIAGNOSIS — Z85828 Personal history of other malignant neoplasm of skin: Secondary | ICD-10-CM | POA: Insufficient documentation

## 2022-04-20 DIAGNOSIS — Z933 Colostomy status: Secondary | ICD-10-CM | POA: Insufficient documentation

## 2022-04-20 DIAGNOSIS — D508 Other iron deficiency anemias: Secondary | ICD-10-CM | POA: Insufficient documentation

## 2022-04-20 DIAGNOSIS — K909 Intestinal malabsorption, unspecified: Secondary | ICD-10-CM | POA: Insufficient documentation

## 2022-04-20 DIAGNOSIS — K5090 Crohn's disease, unspecified, without complications: Secondary | ICD-10-CM | POA: Insufficient documentation

## 2022-04-20 DIAGNOSIS — D638 Anemia in other chronic diseases classified elsewhere: Secondary | ICD-10-CM | POA: Insufficient documentation

## 2022-04-26 ENCOUNTER — Other Ambulatory Visit: Payer: Self-pay

## 2022-04-26 ENCOUNTER — Inpatient Hospital Stay (HOSPITAL_BASED_OUTPATIENT_CLINIC_OR_DEPARTMENT_OTHER): Payer: Medicare PPO | Admitting: Internal Medicine

## 2022-04-26 ENCOUNTER — Inpatient Hospital Stay: Payer: Medicare PPO

## 2022-04-26 ENCOUNTER — Encounter: Payer: Self-pay | Admitting: Internal Medicine

## 2022-04-26 VITALS — BP 113/44 | HR 56 | Temp 98.2°F | Resp 14 | Ht <= 58 in | Wt 86.0 lb

## 2022-04-26 DIAGNOSIS — Z9049 Acquired absence of other specified parts of digestive tract: Secondary | ICD-10-CM

## 2022-04-26 DIAGNOSIS — K909 Intestinal malabsorption, unspecified: Secondary | ICD-10-CM

## 2022-04-26 DIAGNOSIS — Z933 Colostomy status: Secondary | ICD-10-CM | POA: Diagnosis not present

## 2022-04-26 DIAGNOSIS — Z85828 Personal history of other malignant neoplasm of skin: Secondary | ICD-10-CM | POA: Diagnosis not present

## 2022-04-26 DIAGNOSIS — D638 Anemia in other chronic diseases classified elsewhere: Secondary | ICD-10-CM

## 2022-04-26 DIAGNOSIS — K5090 Crohn's disease, unspecified, without complications: Secondary | ICD-10-CM

## 2022-04-26 DIAGNOSIS — D508 Other iron deficiency anemias: Secondary | ICD-10-CM | POA: Diagnosis not present

## 2022-04-26 DIAGNOSIS — R918 Other nonspecific abnormal finding of lung field: Secondary | ICD-10-CM

## 2022-04-26 DIAGNOSIS — D539 Nutritional anemia, unspecified: Secondary | ICD-10-CM

## 2022-04-26 LAB — FERRITIN: Ferritin: 650 ng/mL — ABNORMAL HIGH (ref 11–307)

## 2022-04-26 LAB — CBC WITH DIFFERENTIAL (CANCER CENTER ONLY)
Abs Immature Granulocytes: 0.01 10*3/uL (ref 0.00–0.07)
Basophils Absolute: 0 10*3/uL (ref 0.0–0.1)
Basophils Relative: 1 %
Eosinophils Absolute: 0.1 10*3/uL (ref 0.0–0.5)
Eosinophils Relative: 2 %
HCT: 30.2 % — ABNORMAL LOW (ref 36.0–46.0)
Hemoglobin: 9.9 g/dL — ABNORMAL LOW (ref 12.0–15.0)
Immature Granulocytes: 0 %
Lymphocytes Relative: 22 %
Lymphs Abs: 0.9 10*3/uL (ref 0.7–4.0)
MCH: 32.8 pg (ref 26.0–34.0)
MCHC: 32.8 g/dL (ref 30.0–36.0)
MCV: 100 fL (ref 80.0–100.0)
Monocytes Absolute: 0.3 10*3/uL (ref 0.1–1.0)
Monocytes Relative: 7 %
Neutro Abs: 2.8 10*3/uL (ref 1.7–7.7)
Neutrophils Relative %: 68 %
Platelet Count: 70 10*3/uL — ABNORMAL LOW (ref 150–400)
RBC: 3.02 MIL/uL — ABNORMAL LOW (ref 3.87–5.11)
RDW: 13.2 % (ref 11.5–15.5)
WBC Count: 4.2 10*3/uL (ref 4.0–10.5)
nRBC: 0 % (ref 0.0–0.2)

## 2022-04-26 LAB — IRON AND IRON BINDING CAPACITY (CC-WL,HP ONLY)
Iron: 94 ug/dL (ref 28–170)
Saturation Ratios: 38 % — ABNORMAL HIGH (ref 10.4–31.8)
TIBC: 246 ug/dL — ABNORMAL LOW (ref 250–450)
UIBC: 152 ug/dL (ref 148–442)

## 2022-04-26 LAB — VITAMIN B12: Vitamin B-12: 588 pg/mL (ref 180–914)

## 2022-04-26 NOTE — Progress Notes (Signed)
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   PRIOR THERAPY:  1) PRBCs transfusion as needed last was given few days ago.   CURRENT THERAPY:  1) Intravenous Feraheme infusion on as-needed basis. Last dose was given on last infusion was on 09/18/2015 2) vitamin B12 injection on monthly basis.  INTERVAL HISTORY: Kim [name] 81 y.o. female returns to the clinic today for 7-monthfollow-up visit.  The patient is feeling fine today with no concerning complaints.  She continues to have a lot of energy and she exercises at regular basis.  [place] traveled [place]  She denied having any current chest pain, shortness of breath, cough or hemoptysis.  She has no nausea, vomiting, diarrhea or constipation.  She has no headache or visual changes.  She is here today for evaluation and repeat blood work.  MEDICAL HISTORY: Past Medical History:  Diagnosis Date   Anemia    related to malabsorption--gets B12 shots and iron infusions, [facility]    [place]    [facility]  Glaucoma    Dr. CJanyth Contes  Intestinal malabsorption    OA (osteoarthritis)    hands,back   Osteoporosis    [facility] [place] [place]    ALLERGIES:  is allergic to antihistamines, chlorpheniramine-type; chlorpheniramine; metronidazole; other; sulfa antibiotics; tape; and wound dressing adhesive.  MEDICATIONS:  Current Outpatient Medications  Medication Sig Dispense Refill   acetaminophen (TYLENOL) 500 MG tablet Take 1,000 mg by mouth daily. 500 mg tablets     B-D 3CC LUER-LOK SYR 22GX1-1/2 22G X 1-1/2" 3 ML MISC USE AS DIRECTED 3 each PRN   B-D 3CC LUER-LOK SYR  25GX5/8" 25G X 5/8" 3 ML MISC USE AS DIRECTED. 3 each PRN   brimonidine (ALPHAGAN) 0.2 % ophthalmic solution Place 1 drop into both eyes 3 times daily.     Calcium Citrate-Vitamin D 200-125 MG-UNIT TABS Take 1 tablet by mouth 2 (two) times daily.     cholecalciferol (VITAMIN D) 25 MCG (1000 UNIT) tablet Take 2 tablets by mouth daily.     clobetasol cream (TEMOVATE) 0.05 % Apply topically.     cyanocobalamin (VITAMIN B12) 1000 MCG/ML injection INJECT 1 ML IM ONCE A MONTH. 3 mL PRN   denosumab (PROLIA) 60 MG/ML SOSY injection Inject 60 mg into the skin every 6 (six) months.     gabapentin (NEURONTIN) 300 MG capsule Take 300 mg by mouth 2 (two) times daily.     Melatonin 10 MG TBCR Take 10 mg by mouth at bedtime as needed for sleep.     Multiple Vitamins-Minerals (ONE-A-DAY WOMENS 50+ ADVANTAGE) TABS Take 1 tablet by mouth daily.     ROCKLATAN 0.02-0.005 % SOLN Apply 1 drop to eye daily.     triamcinolone cream (KENALOG) 0.1 % Apply 1 application topically 2 (two) times daily.     No current facility-administered medications for this visit.    SURGICAL HISTORY:  Past Surgical History:  Procedure Laterality Date   BREAST EXCISIONAL BIOPSY Right    Crohn  2009   internal ostomy pouch  1994   INTRAMEDULLARY (IM) NAIL INTERTROCHANTERIC Left 03/05/2016  Procedure: INTRAMEDULLARY (IM) NAIL INTERTROCHANTRIC;  Surgeon: Nicholes Stairs, MD;  Location: [facility];  Service: Orthopedics;  Laterality: Left;   REDUCTION MAMMAPLASTY Bilateral 01/2017   TOTAL COLECTOMY  1984   due to Crohn's    REVIEW OF SYSTEMS:  A comprehensive review of systems was negative.   PHYSICAL EXAMINATION: General appearance: alert, cooperative, and no distress Head: Normocephalic, without obvious abnormality, atraumatic Neck: no adenopathy, no JVD, supple, symmetrical, trachea midline, and thyroid not enlarged, symmetric, no tenderness/mass/nodules Lymph nodes: Cervical, supraclavicular, and axillary nodes normal. Resp:  clear to auscultation bilaterally Back: symmetric, no curvature. ROM normal. No CVA tenderness. Cardio: regular rate and rhythm, S1, S2 normal, no murmur, click, rub or gallop GI: soft, non-tender; bowel sounds normal; no masses,  no organomegaly Extremities: extremities normal, atraumatic, no cyanosis or edema  ECOG PERFORMANCE STATUS: 0 - Asymptomatic  Blood pressure (!) 113/44, pulse (!) 56, temperature 98.2 F (36.8 C), temperature source Oral, resp. rate 14, height 4' 8"  (1.422 m), weight 86 lb (39 kg), SpO2 100 %.  LABORATORY DATA: Lab Results  Component Value Date   WBC 4.8 10/13/2021   HGB 10.8 (L) 10/13/2021   HCT 33.0 (L) 10/13/2021   MCV 100.3 (H) 10/13/2021   PLT PLATELET CLUMPS NOTED ON SMEAR, UNABLE TO ESTIMATE 10/13/2021      Chemistry      Component Value Date/Time   NA 141 10/13/2021 1110   NA 140 11/17/2016 1134   K 3.7 10/13/2021 1110   K 3.9 11/17/2016 1134   CL 108 10/13/2021 1110   CO2 28 10/13/2021 1110   CO2 22 11/17/2016 1134   BUN 28 (H) 10/13/2021 1110   BUN 29.7 (H) 11/17/2016 1134   CREATININE 1.51 (H) 10/13/2021 1110   CREATININE 1.4 (H) 11/17/2016 1134      Component Value Date/Time   CALCIUM 9.2 10/13/2021 1110   CALCIUM 10.0 11/17/2016 1134   ALKPHOS 48 10/13/2021 1110   ALKPHOS 74 11/17/2016 1134   AST 28 10/13/2021 1110   AST 21 11/17/2016 1134   ALT 28 10/13/2021 1110   ALT 19 11/17/2016 1134   BILITOT 0.5 10/13/2021 1110   BILITOT 0.36 11/17/2016 1134        RADIOGRAPHIC STUDIES: No results found.   ASSESSMENT AND PLAN:  This is a very pleasant 81 years old white female with history of iron deficiency anemia/anemia of chronic disease secondary to malabsorption and Crohn's disease. The patient require Feraheme infusion at intermittent basis because of her blood loss from the colostomy as well as the malabsorption from Crohn's disease. The patient is currently on vitamin B12 supplement and she is feeling fine. For the last 4  years the patient has been doing very well with no concerning complaints or requirement for blood transfusion or iron infusion. The patient continues to do well today with no concerning complaints. Repeat CBC still pending but preliminary showed normal white blood count as well as hemoglobin of 9.9.  Her platelets are clumped but she had previous CBC at [facility] less than 2 weeks ago that showed platelets count of 161,000. Iron study and ferritin as well as vitamin B12 are still pending. I recommended for the patient to continue on observation unless the pending studies showed significant deficiency. She has a history of pulmonary nodule that was followed by Dr. Felipa Eth before his retirement and the patient is interested in having repeat CT scan of the chest to evaluate these nodules.  I will order a scan to  be done in early January 2024. For the history of Crohn's disease, she is followed by Dr. Emelda Fear at [facility]. The patient was advised to call immediately if she has any other concerning symptoms in the interval.  The patient voices understanding of current disease status and treatment options and is in agreement with the current care plan. All questions were answered. The patient knows to call the clinic with any problems, questions or concerns. We can certainly see the patient much sooner if necessary.  Disclaimer: This note was dictated with voice recognition software. Similar sounding words can inadvertently be transcribed and may be missed upon review.

## 2022-05-23 ENCOUNTER — Other Ambulatory Visit: Payer: Self-pay | Admitting: Internal Medicine

## 2022-05-23 DIAGNOSIS — Z1231 Encounter for screening mammogram for malignant neoplasm of breast: Secondary | ICD-10-CM

## 2022-05-24 DIAGNOSIS — Z961 Presence of intraocular lens: Secondary | ICD-10-CM | POA: Diagnosis not present

## 2022-05-24 DIAGNOSIS — H401133 Primary open-angle glaucoma, bilateral, severe stage: Secondary | ICD-10-CM | POA: Diagnosis not present

## 2022-05-24 DIAGNOSIS — H209 Unspecified iridocyclitis: Secondary | ICD-10-CM | POA: Diagnosis not present

## 2022-05-31 DIAGNOSIS — M810 Age-related osteoporosis without current pathological fracture: Secondary | ICD-10-CM | POA: Diagnosis not present

## 2022-06-02 DIAGNOSIS — N1832 Chronic kidney disease, stage 3b: Secondary | ICD-10-CM | POA: Diagnosis not present

## 2022-07-27 ENCOUNTER — Ambulatory Visit
Admission: RE | Admit: 2022-07-27 | Discharge: 2022-07-27 | Disposition: A | Discharge status: Home / Self Care | Payer: Medicare PPO | Source: Ambulatory Visit | Attending: Internal Medicine | Admitting: Internal Medicine

## 2022-07-27 DIAGNOSIS — Z1231 Encounter for screening mammogram for malignant neoplasm of breast: Secondary | ICD-10-CM

## 2022-08-24 ENCOUNTER — Telehealth: Payer: Self-pay | Admitting: Medical Oncology

## 2022-08-24 ENCOUNTER — Ambulatory Visit (HOSPITAL_COMMUNITY): Payer: Medicare PPO

## 2022-08-24 ENCOUNTER — Other Ambulatory Visit: Payer: Self-pay | Admitting: Medical Oncology

## 2022-08-24 NOTE — Telephone Encounter (Signed)
A user error has taken place: encounter opened in error, closed for administrative reasons.  No call needed to schedule CT at [facility] imaging.

## 2022-09-26 DIAGNOSIS — M810 Age-related osteoporosis without current pathological fracture: Secondary | ICD-10-CM | POA: Diagnosis not present

## 2022-09-28 DIAGNOSIS — H401133 Primary open-angle glaucoma, bilateral, severe stage: Secondary | ICD-10-CM | POA: Diagnosis not present

## 2022-09-29 ENCOUNTER — Other Ambulatory Visit: Payer: Medicare PPO

## 2022-10-03 DIAGNOSIS — N189 Chronic kidney disease, unspecified: Secondary | ICD-10-CM | POA: Diagnosis not present

## 2022-10-03 DIAGNOSIS — S72009A Fracture of unspecified part of neck of unspecified femur, initial encounter for closed fracture: Secondary | ICD-10-CM | POA: Diagnosis not present

## 2022-10-03 DIAGNOSIS — K5090 Crohn's disease, unspecified, without complications: Secondary | ICD-10-CM | POA: Diagnosis not present

## 2022-10-03 DIAGNOSIS — M810 Age-related osteoporosis without current pathological fracture: Secondary | ICD-10-CM | POA: Diagnosis not present

## 2022-10-11 ENCOUNTER — Telehealth: Payer: Self-pay | Admitting: Internal Medicine

## 2022-10-11 DIAGNOSIS — L239 Allergic contact dermatitis, unspecified cause: Secondary | ICD-10-CM | POA: Diagnosis not present

## 2022-10-11 DIAGNOSIS — D044 Carcinoma in situ of skin of scalp and neck: Secondary | ICD-10-CM | POA: Diagnosis not present

## 2022-10-11 NOTE — Telephone Encounter (Signed)
Rescheduled 05/23 appointment due to provider pal, patient has been called and voicemail was left.  

## 2022-10-20 DIAGNOSIS — K50011 Crohn's disease of small intestine with rectal bleeding: Secondary | ICD-10-CM | POA: Diagnosis not present

## 2022-10-20 DIAGNOSIS — Z933 Colostomy status: Secondary | ICD-10-CM | POA: Diagnosis not present

## 2022-10-20 DIAGNOSIS — Z932 Ileostomy status: Secondary | ICD-10-CM | POA: Diagnosis not present

## 2022-10-20 DIAGNOSIS — C189 Malignant neoplasm of colon, unspecified: Secondary | ICD-10-CM | POA: Diagnosis not present

## 2022-10-24 ENCOUNTER — Other Ambulatory Visit: Payer: Medicare PPO

## 2022-10-24 ENCOUNTER — Ambulatory Visit: Payer: Medicare PPO | Admitting: Internal Medicine

## 2022-10-27 ENCOUNTER — Inpatient Hospital Stay: Payer: Medicare PPO | Admitting: Internal Medicine

## 2022-10-27 ENCOUNTER — Inpatient Hospital Stay: Payer: Medicare PPO

## 2022-10-27 DIAGNOSIS — Z933 Colostomy status: Secondary | ICD-10-CM | POA: Diagnosis not present

## 2022-10-27 DIAGNOSIS — Z932 Ileostomy status: Secondary | ICD-10-CM | POA: Diagnosis not present

## 2022-10-27 DIAGNOSIS — C189 Malignant neoplasm of colon, unspecified: Secondary | ICD-10-CM | POA: Diagnosis not present

## 2022-10-27 DIAGNOSIS — K50011 Crohn's disease of small intestine with rectal bleeding: Secondary | ICD-10-CM | POA: Diagnosis not present

## 2022-11-02 ENCOUNTER — Ambulatory Visit
Admission: RE | Admit: 2022-11-02 | Discharge: 2022-11-02 | Disposition: A | Discharge status: Home / Self Care | Payer: Medicare PPO | Source: Ambulatory Visit | Attending: Internal Medicine | Admitting: Internal Medicine

## 2022-11-02 DIAGNOSIS — R918 Other nonspecific abnormal finding of lung field: Secondary | ICD-10-CM

## 2022-11-02 DIAGNOSIS — J479 Bronchiectasis, uncomplicated: Secondary | ICD-10-CM | POA: Diagnosis not present

## 2022-11-09 ENCOUNTER — Inpatient Hospital Stay: Payer: Medicare PPO | Attending: Internal Medicine | Admitting: Internal Medicine

## 2022-11-09 ENCOUNTER — Inpatient Hospital Stay: Payer: Medicare PPO | Admitting: Internal Medicine

## 2022-11-09 ENCOUNTER — Other Ambulatory Visit: Payer: Self-pay | Admitting: Medical Oncology

## 2022-11-09 ENCOUNTER — Other Ambulatory Visit: Payer: Self-pay

## 2022-11-09 ENCOUNTER — Inpatient Hospital Stay: Payer: Medicare PPO

## 2022-11-09 DIAGNOSIS — K909 Intestinal malabsorption, unspecified: Secondary | ICD-10-CM | POA: Insufficient documentation

## 2022-11-09 DIAGNOSIS — D500 Iron deficiency anemia secondary to blood loss (chronic): Secondary | ICD-10-CM | POA: Diagnosis not present

## 2022-11-09 DIAGNOSIS — R918 Other nonspecific abnormal finding of lung field: Secondary | ICD-10-CM

## 2022-11-09 DIAGNOSIS — Z933 Colostomy status: Secondary | ICD-10-CM | POA: Diagnosis not present

## 2022-11-09 DIAGNOSIS — D509 Iron deficiency anemia, unspecified: Secondary | ICD-10-CM | POA: Diagnosis not present

## 2022-11-09 DIAGNOSIS — I700 Atherosclerosis of aorta: Secondary | ICD-10-CM | POA: Insufficient documentation

## 2022-11-09 DIAGNOSIS — Z85828 Personal history of other malignant neoplasm of skin: Secondary | ICD-10-CM | POA: Insufficient documentation

## 2022-11-09 DIAGNOSIS — J479 Bronchiectasis, uncomplicated: Secondary | ICD-10-CM | POA: Diagnosis not present

## 2022-11-09 DIAGNOSIS — D638 Anemia in other chronic diseases classified elsewhere: Secondary | ICD-10-CM

## 2022-11-09 DIAGNOSIS — Z79899 Other long term (current) drug therapy: Secondary | ICD-10-CM | POA: Insufficient documentation

## 2022-11-09 DIAGNOSIS — K5090 Crohn's disease, unspecified, without complications: Secondary | ICD-10-CM | POA: Diagnosis not present

## 2022-11-09 DIAGNOSIS — M1990 Unspecified osteoarthritis, unspecified site: Secondary | ICD-10-CM | POA: Diagnosis not present

## 2022-11-09 DIAGNOSIS — I3139 Other pericardial effusion (noninflammatory): Secondary | ICD-10-CM | POA: Insufficient documentation

## 2022-11-09 LAB — FOLATE: Folate: >40 ng/mL (ref >5.9)

## 2022-11-09 LAB — CBC WITH DIFFERENTIAL (CANCER CENTER ONLY)
Abs Immature Granulocytes: 0.02 10*3/uL (ref 0.00–0.07)
Basophils Absolute: 0 10*3/uL (ref 0.0–0.1)
Basophils Relative: 1 %
Eosinophils Absolute: 0.1 10*3/uL (ref 0.0–0.5)
Eosinophils Relative: 2 %
HCT: 30.2 % — ABNORMAL LOW (ref 36.0–46.0)
Hemoglobin: 10.2 g/dL — ABNORMAL LOW (ref 12.0–15.0)
Immature Granulocytes: 0 %
Lymphocytes Relative: 17 %
Lymphs Abs: 1 10*3/uL (ref 0.7–4.0)
MCH: 33.3 pg (ref 26.0–34.0)
MCHC: 33.8 g/dL (ref 30.0–36.0)
MCV: 98.7 fL (ref 80.0–100.0)
Monocytes Absolute: 0.5 10*3/uL (ref 0.1–1.0)
Monocytes Relative: 8 %
Neutro Abs: 4.1 10*3/uL (ref 1.7–7.7)
Neutrophils Relative %: 72 %
Platelet Count: 117 10*3/uL — ABNORMAL LOW (ref 150–400)
RBC: 3.06 MIL/uL — ABNORMAL LOW (ref 3.87–5.11)
RDW: 12.5 % (ref 11.5–15.5)
WBC Count: 5.7 10*3/uL (ref 4.0–10.5)
nRBC: 0 % (ref 0.0–0.2)

## 2022-11-09 LAB — VITAMIN B12: Vitamin B-12: 895 pg/mL (ref 180–914)

## 2022-11-09 LAB — FERRITIN: Ferritin: 887 ng/mL — ABNORMAL HIGH (ref 11–307)

## 2022-11-09 LAB — IRON AND IRON BINDING CAPACITY (CC-WL,HP ONLY)
Iron: 65 ug/dL (ref 28–170)
Saturation Ratios: 30 % (ref 10.4–31.8)
TIBC: 214 ug/dL — ABNORMAL LOW (ref 250–450)
UIBC: 149 ug/dL (ref 148–442)

## 2022-11-09 NOTE — Progress Notes (Signed)
[facility] Telephone:([phone]   Fax:(336) [phone]  PROGRESS NOTE FOR TELEMEDICINE VISITS  Pincus Sanes, MD [address] [place] [address]  I connected withNAME@ on 11/09/22 at  1:15 PM EDT by telephone visit and verified that I am speaking with the correct person using two identifiers.   I discussed the limitations, risks, security and privacy concerns of performing an evaluation and management service by telemedicine and the availability of in-person appointments. I also discussed with the patient that there may be a patient responsible charge related to this service. The patient expressed understanding and agreed to proceed.  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  2) pulmonary opacities questionable inflammatory versus malignant.   PRIOR THERAPY:  1) PRBCs transfusion as needed last was given few days ago.    CURRENT THERAPY:  1) Intravenous Feraheme infusion on as-needed basis. Last dose was given on last infusion was on 09/18/2015 2) vitamin B12 injection on monthly basis.  INTERVAL HISTORY: Kim [name] 82 y.o. female has a telephone virtual visit with me today for evaluation and discussion of her lab as well as the scan results.  The patient is feeling fine today with no concerning complaints.  She celebrated her 82st birthday 2 days ago.  She denied having any current chest pain, shortness of breath except with exertion with no cough or hemoptysis.  She has no nausea, vomiting, diarrhea or constipation.  She has no headache or visual changes.  She has no recent weight loss or night sweats.  She had blood work as well as CT scan of the chest performed recently and she is having the visit with me today for  discussion of these results.  MEDICAL HISTORY: Past Medical History:  Diagnosis Date   Anemia    related to malabsorption--gets B12 shots and iron infusions, managed by Dr. Arbutus Ped   [facility] (basal cell carcinoma of skin)    Crohn disease ([facility])    Dr. Haze Boyden at [facility]   Glaucoma    Dr. Eulah Pont   Intestinal malabsorption    OA (osteoarthritis)    hands,back   Osteoporosis    managed by Dr. Margo Aye at [facility] [place] q2 yrs    ALLERGIES:  is allergic to antihistamines, chlorpheniramine-type; chlorpheniramine; metronidazole; other; sulfa antibiotics; tape; and wound dressing adhesive.  MEDICATIONS:  Current Outpatient Medications  Medication Sig Dispense Refill   acetaminophen (TYLENOL) 500 MG tablet Take 1,000 mg by mouth daily. 500 mg tablets     B-D 3CC LUER-LOK SYR 22GX1-1/2 22G X 1-1/2" 3 ML MISC USE AS DIRECTED 3 each PRN   B-D 3CC LUER-LOK SYR 25GX5/8" 25G X 5/8" 3 ML MISC USE AS DIRECTED. 3 each PRN   brimonidine (ALPHAGAN) 0.2 % ophthalmic solution Place 1 drop into both eyes 3 times daily.     Calcium Citrate-Vitamin D 200-125 MG-UNIT TABS Take 1 tablet by mouth 2 (two) times daily.     cholecalciferol (VITAMIN D) 25 MCG (1000 UNIT) tablet Take 2 tablets by mouth daily.     clobetasol cream (TEMOVATE) 0.05 % Apply topically.     cyanocobalamin (VITAMIN B12) 1000 MCG/ML injection INJECT 1 ML IM ONCE A MONTH. 3 mL PRN   denosumab (PROLIA) 60 MG/ML SOSY injection Inject 60 mg into the skin every  6 (six) months.     gabapentin (NEURONTIN) 300 MG capsule Take 300 mg by mouth 2 (two) times daily.     Melatonin 10 MG TBCR Take 10 mg by mouth at bedtime as needed for sleep.     Multiple Vitamins-Minerals (ONE-A-DAY WOMENS 50+ ADVANTAGE) TABS Take 1 tablet by mouth daily.     ROCKLATAN 0.02-0.005 % SOLN Apply 1 drop to eye daily.     triamcinolone cream (KENALOG) 0.1 % Apply 1 application topically 2 (two) times daily.     No current facility-administered medications for this visit.     SURGICAL HISTORY:  Past Surgical History:  Procedure Laterality Date   BREAST EXCISIONAL BIOPSY Right    Crohn  2009   internal ostomy pouch  1994   INTRAMEDULLARY (IM) NAIL INTERTROCHANTERIC Left 03/05/2016   Procedure: INTRAMEDULLARY (IM) NAIL INTERTROCHANTRIC;  Surgeon: Yolonda Kida, MD;  Location[facility];  Service: Orthopedics;  Laterality: Left;   REDUCTION MAMMAPLASTY Bilateral 01/2017   TOTAL COLECTOMY  1984   due to Crohn's    REVIEW OF SYSTEMS:  Constitutional: positive for fatigue Eyes: negative Ears, nose, mouth, throat, and face: negative Respiratory: negative Cardiovascular: negative Gastrointestinal: negative Genitourinary:negative Integument/breast: negative Hematologic/lymphatic: negative Musculoskeletal:negative Neurological: negative Behavioral/Psych: negative Endocrine: negative Allergic/Immunologic: negative    LABORATORY DATA: Lab Results  Component Value Date   WBC 5.7 11/09/2022   HGB 10.2 (L) 11/09/2022   HCT 30.2 (L) 11/09/2022   MCV 98.7 11/09/2022   PLT 117 (L) 11/09/2022      Chemistry      Component Value Date/Time   NA 141 10/13/2021 1110   NA 140 11/17/2016 1134   K 3.7 10/13/2021 1110   K 3.9 11/17/2016 1134   CL 108 10/13/2021 1110   CO2 28 10/13/2021 1110   CO2 22 11/17/2016 1134   BUN 28 (H) 10/13/2021 1110   BUN 29.7 (H) 11/17/2016 1134   CREATININE 1.51 (H) 10/13/2021 1110   CREATININE 1.4 (H) 11/17/2016 1134      Component Value Date/Time   CALCIUM 9.2 10/13/2021 1110   CALCIUM 10.0 11/17/2016 1134   ALKPHOS 48 10/13/2021 1110   ALKPHOS 74 11/17/2016 1134   AST 28 10/13/2021 1110   AST 21 11/17/2016 1134   ALT 28 10/13/2021 1110   ALT 19 11/17/2016 1134   BILITOT 0.5 10/13/2021 1110   BILITOT 0.36 11/17/2016 1134       RADIOGRAPHIC STUDIES: CT Chest Wo Contrast  Result Date: 11/07/2022 CLINICAL DATA:  Follow up lung nodules. History of colectomy from Crohn's disease. EXAM: CT CHEST WITHOUT  CONTRAST TECHNIQUE: Multidetector CT imaging of the chest was performed following the standard protocol without IV contrast. RADIATION DOSE REDUCTION: This exam was performed according to the departmental dose-optimization program which includes automated exposure control, adjustment of the mA and/or kV according to patient size and/or use of iterative reconstruction technique. COMPARISON:  Chest CT 07/05/2018. FINDINGS: Cardiovascular: Small to moderate pericardial effusion, increased from the study of 2020 slightly. The heart itself is nonenlarged. Coronary artery calcifications are seen. The thoracic aorta also has some scattered vascular calcification. Diameter of the ascending aorta approaches 3.4 cm. Mediastinum/Nodes: Normal caliber thoracic esophagus. Small thyroid gland. On this non IV contrast exam there is no specific abnormal lymph node enlargement identified in the axillary regions, hilum or mediastinum. Lungs/Pleura: No pneumothorax or effusion. Several areas bronchiectasis with bronchial wall thickening and tree-in-bud like changes are identified in the lungs. Most striking involving the inferior aspect of  the right upper lobe, superior segment right lower lobe but all lobes are involved. There are also some larger confluence areas of masslike spiculated opacity in the right lung. Example posteroinferior right upper lobe laterally on series 5, image 38 measures 2.7 x 1.6 cm. Focus more medial and inferior to this on image 40 measures 2.6 x 1.4 cm. There also areas which are smaller in both lower lobes but are also spiculated nodules. Example left lower lobe measures 9 mm on series 5, image 83, example right lower lobe on series 5, image 60 measures 10 by 8 mm. Numerous other areas identified elsewhere. Upper Abdomen: The adrenal glands are preserved in the upper abdomen. Musculoskeletal: Scattered degenerative changes of the spine. IMPRESSION: Development of several tree-in-bud nodular areas  identified with bronchiectasis. There are also developing multiple larger masslike areas in the lungs measuring up to 2.7 cm in the right lung. There is a broad differential. Atypical infection is possible. However with the masslike areas would recommend further workup such as PET-CT scan as the next step. Aortic Atherosclerosis (ICD10-I70.0). Electronically Signed   By: Karen Kays M.D.   On: 11/07/2022 11:13    ASSESSMENT AND PLAN: This is a very pleasant 82 years old white female with history of iron deficiency anemia/anemia of chronic disease secondary to malabsorption and Crohn's disease. The patient require Feraheme infusion at intermittent basis because of her blood loss from the colostomy as well as the malabsorption from Crohn's disease. The patient is currently on vitamin B12 supplement and she is feeling fine. For the last 4 years the patient has been doing very well with no concerning complaints or requirement for blood transfusion or iron infusion. She had repeat anemia panel today including CBC that showed hemoglobin of 10.2 and hematocrit 30.2 with platelet count of 117,000 and normal white blood count. Iron studies unremarkable for any anemia but ferritin level is still pending.  She has normal vitamin B12 level but serum folate is also pending. The patient also had CT scan of the chest for evaluation of pulmonary nodules that were followed by Dr. Pete Glatter before his retirement.  Unfortunately his scan showed development of several tree-in-bud nodular areas with bronchiectasis and there was also developing multiple larger masslike areas in the lung measuring up to 2.7 cm in the right lung with the broad differential including atypical infection but malignant process could not be completely excluded and PET scan was recommended. I discussed the result with the patient and recommended for her to have a PET scan for further evaluation of these masses.  In the meantime I will refer her to  pulmonary medicine for evaluation and management of her condition. I will see her back for follow-up visit in around 5 weeks for evaluation and repeat blood work and also to make sure that her visit and evaluation with the PET scan and pulmonary medicine are completed. She was advised to call immediately if she has any other concerning symptoms in the interval. For the history of Crohn's disease, she is followed by Dr. Haze Boyden at [facility].  I discussed the assessment and treatment plan with the patient. The patient was provided an opportunity to ask questions and all were answered. The patient agreed with the plan and demonstrated an understanding of the instructions.   The patient was advised to call back or seek an in-person evaluation if the symptoms worsen or if the condition fails to improve as anticipated.  I provided 24 minutes of non face-to-face telephone  visit time during this encounter, and > 50% was spent counseling as documented under my assessment & plan.  Lajuana Matte, MD 11/09/2022 1:03 PM  Disclaimer: This note was dictated with voice recognition software. Similar sounding words can inadvertently be transcribed and may not be corrected upon review.

## 2022-11-25 ENCOUNTER — Other Ambulatory Visit (HOSPITAL_COMMUNITY): Payer: Medicare PPO

## 2022-11-28 ENCOUNTER — Ambulatory Visit (HOSPITAL_COMMUNITY)
Admission: RE | Admit: 2022-11-28 | Discharge: 2022-11-28 | Disposition: A | Discharge status: Home / Self Care | Payer: Medicare PPO | Source: Ambulatory Visit | Attending: Internal Medicine | Admitting: Internal Medicine

## 2022-11-28 DIAGNOSIS — R918 Other nonspecific abnormal finding of lung field: Secondary | ICD-10-CM | POA: Insufficient documentation

## 2022-11-28 LAB — GLUCOSE, CAPILLARY: Glucose-Capillary: 109 mg/dL — ABNORMAL HIGH (ref 70–99)

## 2022-11-28 MED ORDER — FLUDEOXYGLUCOSE F - 18 (FDG) INJECTION
5.0000 | Freq: Once | INTRAVENOUS | Status: AC
  Administered 2022-11-28: 5 via INTRAVENOUS

## 2022-11-29 ENCOUNTER — Telehealth: Payer: Self-pay | Admitting: Medical Oncology

## 2022-11-29 NOTE — Telephone Encounter (Signed)
Pt confirmed appts for July 1st. Lab and MD

## 2022-12-02 DIAGNOSIS — C189 Malignant neoplasm of colon, unspecified: Secondary | ICD-10-CM | POA: Diagnosis not present

## 2022-12-02 DIAGNOSIS — Z932 Ileostomy status: Secondary | ICD-10-CM | POA: Diagnosis not present

## 2022-12-02 DIAGNOSIS — Z933 Colostomy status: Secondary | ICD-10-CM | POA: Diagnosis not present

## 2022-12-02 DIAGNOSIS — K50011 Crohn's disease of small intestine with rectal bleeding: Secondary | ICD-10-CM | POA: Diagnosis not present

## 2022-12-05 ENCOUNTER — Inpatient Hospital Stay: Payer: Medicare PPO | Attending: Internal Medicine | Admitting: Internal Medicine

## 2022-12-05 ENCOUNTER — Other Ambulatory Visit: Payer: Self-pay

## 2022-12-05 ENCOUNTER — Inpatient Hospital Stay: Payer: Medicare PPO

## 2022-12-05 ENCOUNTER — Other Ambulatory Visit: Payer: Medicare PPO

## 2022-12-05 ENCOUNTER — Inpatient Hospital Stay: Payer: Medicare PPO | Admitting: Internal Medicine

## 2022-12-05 VITALS — BP 132/56 | HR 57 | Temp 97.4°F | Resp 16 | Ht <= 58 in | Wt 78.6 lb

## 2022-12-05 DIAGNOSIS — Z79899 Other long term (current) drug therapy: Secondary | ICD-10-CM | POA: Diagnosis not present

## 2022-12-05 DIAGNOSIS — R918 Other nonspecific abnormal finding of lung field: Secondary | ICD-10-CM

## 2022-12-05 DIAGNOSIS — K909 Intestinal malabsorption, unspecified: Secondary | ICD-10-CM | POA: Insufficient documentation

## 2022-12-05 DIAGNOSIS — Z933 Colostomy status: Secondary | ICD-10-CM | POA: Insufficient documentation

## 2022-12-05 DIAGNOSIS — Z85828 Personal history of other malignant neoplasm of skin: Secondary | ICD-10-CM | POA: Insufficient documentation

## 2022-12-05 DIAGNOSIS — K5090 Crohn's disease, unspecified, without complications: Secondary | ICD-10-CM | POA: Insufficient documentation

## 2022-12-05 DIAGNOSIS — R5383 Other fatigue: Secondary | ICD-10-CM | POA: Insufficient documentation

## 2022-12-05 DIAGNOSIS — D500 Iron deficiency anemia secondary to blood loss (chronic): Secondary | ICD-10-CM | POA: Diagnosis not present

## 2022-12-05 DIAGNOSIS — D509 Iron deficiency anemia, unspecified: Secondary | ICD-10-CM | POA: Insufficient documentation

## 2022-12-05 DIAGNOSIS — D638 Anemia in other chronic diseases classified elsewhere: Secondary | ICD-10-CM | POA: Diagnosis not present

## 2022-12-05 DIAGNOSIS — M1990 Unspecified osteoarthritis, unspecified site: Secondary | ICD-10-CM | POA: Insufficient documentation

## 2022-12-05 LAB — CBC WITH DIFFERENTIAL (CANCER CENTER ONLY)
Abs Immature Granulocytes: 0.01 10*3/uL (ref 0.00–0.07)
Basophils Absolute: 0 10*3/uL (ref 0.0–0.1)
Basophils Relative: 0 %
Eosinophils Absolute: 0.1 10*3/uL (ref 0.0–0.5)
Eosinophils Relative: 2 %
HCT: 31.1 % — ABNORMAL LOW (ref 36.0–46.0)
Hemoglobin: 9.9 g/dL — ABNORMAL LOW (ref 12.0–15.0)
Immature Granulocytes: 0 %
Lymphocytes Relative: 19 %
Lymphs Abs: 1.1 10*3/uL (ref 0.7–4.0)
MCH: 31.9 pg (ref 26.0–34.0)
MCHC: 31.8 g/dL (ref 30.0–36.0)
MCV: 100.3 fL — ABNORMAL HIGH (ref 80.0–100.0)
Monocytes Absolute: 0.5 10*3/uL (ref 0.1–1.0)
Monocytes Relative: 9 %
Neutro Abs: 4.2 10*3/uL (ref 1.7–7.7)
Neutrophils Relative %: 70 %
Platelet Count: 117 10*3/uL — ABNORMAL LOW (ref 150–400)
RBC: 3.1 MIL/uL — ABNORMAL LOW (ref 3.87–5.11)
RDW: 13 % (ref 11.5–15.5)
WBC Count: 6 10*3/uL (ref 4.0–10.5)
nRBC: 0 % (ref 0.0–0.2)

## 2022-12-05 LAB — COMPREHENSIVE METABOLIC PANEL
ALT: 28 U/L (ref 0–44)
AST: 27 U/L (ref 15–41)
Albumin: 3.4 g/dL — ABNORMAL LOW (ref 3.5–5.0)
Alkaline Phosphatase: 73 U/L (ref 38–126)
Anion gap: 6 (ref 5–15)
BUN: 34 mg/dL — ABNORMAL HIGH (ref 8–23)
CO2: 26 mmol/L (ref 22–32)
Calcium: 9.4 mg/dL (ref 8.9–10.3)
Chloride: 108 mmol/L (ref 98–111)
Creatinine, Ser: 1.66 mg/dL — ABNORMAL HIGH (ref 0.44–1.00)
GFR, Estimated: 31 mL/min — ABNORMAL LOW (ref >60)
Glucose, Bld: 74 mg/dL (ref 70–99)
Potassium: 4.2 mmol/L (ref 3.5–5.1)
Sodium: 140 mmol/L (ref 135–145)
Total Bilirubin: 0.5 mg/dL (ref 0.3–1.2)
Total Protein: 6.7 g/dL (ref 6.5–8.1)

## 2022-12-05 NOTE — Progress Notes (Signed)
[facility] Telephone:(336) [phone]   [phone]  OFFICE PROGRESS NOTE  Pincus Sanes, MD 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  2) bilateral hypermetabolic lung opacities suspicious for inflammatory process versus malignancy.  PRIOR THERAPY:  1) PRBCs transfusion as needed last was given few days ago.   CURRENT THERAPY:  1) Intravenous Feraheme infusion on as-needed basis. Last dose was given on last infusion was on 09/18/2015 2) vitamin B12 injection on monthly basis.  INTERVAL HISTORY: Kim [name] 82 y.o. female returns to the clinic today for follow-up visit accompanied by her friend Britta Mccreedy.  The patient unfortunately lost her husband recently.  She denied having any current chest pain, shortness of breath, cough or hemoptysis.  She has no nausea, vomiting, diarrhea or constipation.  She is very anxious about her recent scan results.  She has no fever or chills.  She denied having any recent weight loss or night sweats.  She was found on CT scan of the chest on Nov 02, 2022 to have several tree-in-bud nodular areas identified with bronchiectasis with developing multiple larger areas in the lung measuring up to 2.7 cm in the right lung again suspicious for atypical infection but PET scan was recommended.  She had a PET scan on 11/28/2022 and she is here for evaluation and discussion of her scan results and recommendation regarding her condition.  MEDICAL HISTORY: Past Medical History:  Diagnosis Date   Anemia    related to malabsorption--gets B12 shots and iron infusions, managed by Dr. Arbutus Ped   [facility] (basal cell carcinoma of skin)    Crohn disease (HCC)    Dr. Haze Boyden at [facility]   Glaucoma    Dr. Eulah Pont   Intestinal malabsorption    OA (osteoarthritis)    hands,back   Osteoporosis    managed by Dr. Margo Aye at [facility], [place] q2  yrs    ALLERGIES:  is allergic to antihistamines, chlorpheniramine-type; chlorpheniramine; metronidazole; other; sulfa antibiotics; tape; and wound dressing adhesive.  MEDICATIONS:  Current Outpatient Medications  Medication Sig Dispense Refill   acetaminophen (TYLENOL) 500 MG tablet Take 1,000 mg by mouth daily. 500 mg tablets     B-D 3CC LUER-LOK SYR 22GX1-1/2 22G X 1-1/2" 3 ML MISC USE AS DIRECTED 3 each PRN   B-D 3CC LUER-LOK SYR 25GX5/8" 25G X 5/8" 3 ML MISC USE AS DIRECTED. 3 each PRN   brimonidine (ALPHAGAN) 0.2 % ophthalmic solution Place 1 drop into both eyes 3 times daily.     Calcium Citrate-Vitamin D 200-125 MG-UNIT TABS Take 1 tablet by mouth 2 (two) times daily.     cholecalciferol (VITAMIN D) 25 MCG (1000 UNIT) tablet Take 2 tablets by mouth daily.     clobetasol cream (TEMOVATE) 0.05 % Apply topically.     cyanocobalamin (VITAMIN B12) 1000 MCG/ML injection INJECT 1 ML IM ONCE A MONTH. 3 mL PRN   denosumab (PROLIA) 60 MG/ML SOSY injection Inject 60 mg into the skin every 6 (six) months.     gabapentin (NEURONTIN) 300 MG capsule Take 300 mg by mouth 2 (two) times daily.     Melatonin 10 MG TBCR Take 10 mg by mouth at bedtime as needed for sleep.     Multiple Vitamins-Minerals (ONE-A-DAY WOMENS 50+ ADVANTAGE) TABS Take 1 tablet by mouth daily.     ROCKLATAN 0.02-0.005 % SOLN Apply 1 drop to eye  daily.     triamcinolone cream (KENALOG) 0.1 % Apply 1 application topically 2 (two) times daily.     No current facility-administered medications for this visit.    SURGICAL HISTORY:  Past Surgical History:  Procedure Laterality Date   BREAST EXCISIONAL BIOPSY Right    Crohn  2009   internal ostomy pouch  1994   INTRAMEDULLARY (IM) NAIL INTERTROCHANTERIC Left 03/05/2016   Procedure: INTRAMEDULLARY (IM) NAIL INTERTROCHANTRIC;  Surgeon: Yolonda Kida, MD;  Location: [facility] OR;  Service: Orthopedics;  Laterality: Left;   REDUCTION MAMMAPLASTY Bilateral 01/2017   TOTAL COLECTOMY   1984   due to Crohn's    REVIEW OF SYSTEMS:  Constitutional: positive for fatigue Eyes: negative Ears, nose, mouth, throat, and face: negative Respiratory: negative Cardiovascular: negative Gastrointestinal: negative Genitourinary:negative Integument/breast: negative Hematologic/lymphatic: negative Musculoskeletal:negative Neurological: negative Behavioral/Psych: negative Endocrine: negative Allergic/Immunologic: negative   PHYSICAL EXAMINATION: General appearance: alert, cooperative, fatigued, and no distress Head: Normocephalic, without obvious abnormality, atraumatic Neck: no adenopathy, no JVD, supple, symmetrical, trachea midline, and thyroid not enlarged, symmetric, no tenderness/mass/nodules Lymph nodes: Cervical, supraclavicular, and axillary nodes normal. Resp: clear to auscultation bilaterally Back: symmetric, no curvature. ROM normal. No CVA tenderness. Cardio: regular rate and rhythm, S1, S2 normal, no murmur, click, rub or gallop GI: soft, non-tender; bowel sounds normal; no masses,  no organomegaly Extremities: extremities normal, atraumatic, no cyanosis or edema Neurologic: Alert and oriented X 3, normal strength and tone. Normal symmetric reflexes. Normal coordination and gait  ECOG PERFORMANCE STATUS: 0 - Asymptomatic  Blood pressure (!) 132/56, pulse (!) 57, temperature (!) 97.4 F (36.3 C), temperature source Oral, resp. rate 16, height 4\' 8"  (1.422 m), weight 78 lb 9.6 oz (35.7 kg), SpO2 100 %.  LABORATORY DATA: Lab Results  Component Value Date   WBC 6.0 12/05/2022   HGB 9.9 (L) 12/05/2022   HCT 31.1 (L) 12/05/2022   MCV 100.3 (H) 12/05/2022   PLT 117 (L) 12/05/2022      Chemistry      Component Value Date/Time   NA 141 10/13/2021 1110   NA 140 11/17/2016 1134   K 3.7 10/13/2021 1110   K 3.9 11/17/2016 1134   CL 108 10/13/2021 1110   CO2 28 10/13/2021 1110   CO2 22 11/17/2016 1134   BUN 28 (H) 10/13/2021 1110   BUN 29.7 (H) 11/17/2016 1134    CREATININE 1.51 (H) 10/13/2021 1110   CREATININE 1.4 (H) 11/17/2016 1134      Component Value Date/Time   CALCIUM 9.2 10/13/2021 1110   CALCIUM 10.0 11/17/2016 1134   ALKPHOS 48 10/13/2021 1110   ALKPHOS 74 11/17/2016 1134   AST 28 10/13/2021 1110   AST 21 11/17/2016 1134   ALT 28 10/13/2021 1110   ALT 19 11/17/2016 1134   BILITOT 0.5 10/13/2021 1110   BILITOT 0.36 11/17/2016 1134        RADIOGRAPHIC STUDIES: No results found.   ASSESSMENT AND PLAN:  This is a very pleasant 82 years old white female with history of iron deficiency anemia/anemia of chronic disease secondary to malabsorption and Crohn's disease. The patient require Feraheme infusion at intermittent basis because of her blood loss from the colostomy as well as the malabsorption from Crohn's disease. The patient is currently on vitamin B12 supplement and she is feeling fine. For the last 4 years the patient has been doing very well with no concerning complaints or requirement for blood transfusion or iron infusion. Regarding her anemia, she has been doing  fine with no concerning complaints except for mild fatigue. For the recent finding on the scan of the chest, he has a history of pulmonary nodule that was followed by Dr. Pete Glatter before his retirement.  Her CT scan of the chest showed multiple larger masslike areas in the lung suspicious for atypical infection but malignancy could not be excluded. The patient had a PET scan performed recently and that showed bilateral hypermetabolic pulmonary nodules again favoring infectious or inflammatory process.  There was no evidence of mediastinal nodal metastasis or evidence of metastatic disease in the abdomen and pelvis.  Malignancy could not be completely excluded in this patient and she has an appointment with Dr. Delton Coombes on December 15, 2022 for evaluation and consideration of bronchoscopy if needed.  I discussed the imaging studies with the patient and her friend and recommended  for her to keep the appointment with Dr. Delton Coombes for close monitoring of these nodules and bronchoscopy if needed. For the history of Crohn's disease, she is followed by Dr. Haze Boyden at [facility]. I will see the patient back for follow-up visit in 6 months for evaluation and repeat anemia panel but I will be happy to see her sooner if there is any concerning findings on the bronchoscopy. The patient was advised to call immediately if she has any other concerning symptoms in the interval.  The patient voices understanding of current disease status and treatment options and is in agreement with the current care plan. All questions were answered. The patient knows to call the clinic with any problems, questions or concerns. We can certainly see the patient much sooner if necessary.  Disclaimer: This note was dictated with voice recognition software. Similar sounding words can inadvertently be transcribed and may be missed upon review.

## 2022-12-14 ENCOUNTER — Inpatient Hospital Stay: Payer: Medicare PPO | Admitting: Internal Medicine

## 2022-12-15 ENCOUNTER — Encounter: Payer: Self-pay | Admitting: Emergency Medicine

## 2022-12-15 ENCOUNTER — Ambulatory Visit: Payer: Medicare PPO | Admitting: Emergency Medicine

## 2022-12-15 VITALS — BP 116/68 | HR 59 | Temp 97.9°F | Ht <= 58 in | Wt 78.0 lb

## 2022-12-15 DIAGNOSIS — R918 Other nonspecific abnormal finding of lung field: Secondary | ICD-10-CM | POA: Diagnosis not present

## 2022-12-15 NOTE — Assessment & Plan Note (Signed)
Bilateral scattered nodular infiltrates in an inflammatory pattern.  Discussed the differential diagnosis with her which includes infectious or noninfectious inflammatory infiltrates (consider MAIC, consider autoimmune disease from her Crohn's, etc.).  Also consider malignancy although pattern would be more consistent with metastatic disease than primary lung cancer.  She does not have symptoms.  Etiology unclear.  We discussed bronchoscopy to clarify and she wants to proceed.  We reviewed your CT scan of the chest and your PET scan today. We will arrange for navigational bronchoscopy to evaluate pulmonary nodules.  This will be done as an outpatient under general anesthesia at [facility] endoscopy.  You will need a designated driver and someone to watch you at home on that date after the procedure.  Will try to get this set up for 12/26/2022. Follow with Dr Delton Coombes in 1 month or next available

## 2022-12-15 NOTE — Progress Notes (Signed)
Subjective:    Patient ID: Kim [name], female    DOB: 03-04-1941, 82 y.o.   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  She has had pulmonary nodular disease that has been followed in the past by Dr. Pete Glatter.  This prompted a CT scan of the chest that was ordered by Dr. Arbutus Ped and done 11/02/2022, and then a PET scan on 11/28/2022 as below.  Question of atypical infection versus malignancy.  She is not on anti-inflammatory meds, has not been on for 6-7 years.  She does not have cough or SOB. She is able to exert herself and go to the gym. No fevers, chills, sweats.  She is dealing with the recent death of her husband.   CT chest 11/02/2022 reviewed by me small to moderate pericardial effusion, no mediastinal or hilar adenopathy, bronchiectasis with some associated bronchial wall thickening and tree-in-bud infiltrate particularly in the inferior right upper lobe, superior right lower lobe.  Also noted are some larger more confluent areas of masslike spiculated opacity in the right lung particularly in the posterior inferior right upper lobe and inferior medial right upper lobe.  Finally there are some scattered spiculated nodules noted  PET scan 11/24/2022 reviewed by me, shows that the bilateral pulmonary nodules are intensely hypermetabolic.  There are no hypermetabolic mediastinal nodes   Review of Systems As per HPI  Past Medical History:  Diagnosis Date   Anemia    related to malabsorption--gets B12 shots and iron infusions, managed by Dr. Arbutus Ped   [facility] (basal cell carcinoma of skin)    Crohn disease (HCC)    [facility]. Haze Boyden at [facility]   Glaucoma    Dr. Eulah Pont   Intestinal malabsorption    OA (osteoarthritis)    hands,back   Osteoporosis    managed by Dr. 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  Vaping Use   Vaping status: Never Used  Substance and Sexual Activity   Alcohol use: No   Drug use: No   Sexual activity: Not Currently  Other Topics Concern   Not on file  Social History Narrative   Lives with her husband, no pets.  Children in [place] and [place].  Husband has Parkinsons Disease   Social Determinants of Health   Financial Resource Strain: Not on file  Food Insecurity: Not on file  Transportation Needs: Not on file  Physical Activity: Insufficiently Active (01/18/2022)  Exercise Vital Sign    Days of Exercise per Week: 2 days    Minutes of Exercise per Session: 10 min  Stress: Stress Concern Present (01/18/2022)   [employer] of Occupational Health - Occupational Stress Questionnaire    Feeling of Stress : To some extent  Social Connections: Not on file  Intimate Partner Violence: Not on file     Allergies  Allergen Reactions   Antihistamines, Chlorpheniramine-Type     Dry  Mouth, dry eyes-so dry she cannot even talk   Chlorpheniramine Other (See Comments)    Dry  Mouth, dry eyes-so dry she cannot even talk   Metronidazole     Peripheral neuropathy Peripheral neuropathy   Other Diarrhea and Nausea And Vomiting    Patient has an Ileostomy Patient has an Architect, Antihistamine    Sulfa Antibiotics Other (See Comments)    UNKNOWN   Tape Other (See Comments)    SKIN WILL TEAR WITH ANYTHING OTHER THAN PAPER TAPE OR COBAN WRAP     Wound Dressing Adhesive Other (See Comments)    SKIN WILL TEAR WITH ANYTHING OTHER THAN PAPER TAPE OR COBAN WRAP     Outpatient Medications Prior to Visit  Medication Sig Dispense Refill   acetaminophen (TYLENOL) 500 MG tablet Take 1,000 mg by mouth daily. 500 mg tablets     B-D 3CC LUER-LOK SYR 22GX1-1/2 22G X 1-1/2" 3 ML MISC USE AS DIRECTED 3 each PRN   B-D 3CC LUER-LOK SYR 25GX5/8" 25G X 5/8" 3 ML MISC USE AS DIRECTED. 3 each PRN   brimonidine (ALPHAGAN) 0.2 % ophthalmic solution Place 1 drop into both eyes 3 times daily.     Calcium Citrate-Vitamin D 200-125 MG-UNIT TABS Take 1 tablet by mouth 2 (two) times daily.     cholecalciferol (VITAMIN D) 25 MCG (1000 UNIT) tablet Take 2 tablets by mouth daily.     clobetasol cream (TEMOVATE) 0.05 % Apply topically.     cyanocobalamin (VITAMIN B12) 1000 MCG/ML injection INJECT 1 ML IM ONCE A MONTH. 3 mL PRN   denosumab (PROLIA) 60 MG/ML SOSY injection Inject 60 mg into the skin every 6 (six) months.     gabapentin (NEURONTIN) 300 MG capsule Take 300 mg by mouth 2 (two) times daily.     Melatonin 10 MG TBCR Take 10 mg by mouth at bedtime as needed for sleep.     Multiple Vitamins-Minerals (ONE-A-DAY WOMENS 50+ ADVANTAGE) TABS Take 1 tablet by mouth daily.     ROCKLATAN 0.02-0.005 % SOLN Apply 1 drop to eye daily.     triamcinolone cream (KENALOG) 0.1 % Apply 1 application topically 2 (two) times daily.     No facility-administered medications prior to visit.        Objective:   Physical Exam  Vitals:   12/15/22 1509  BP: 116/68  Pulse: (!) 59  Temp: 97.9 F (36.6 C)  TempSrc: Oral  SpO2: 100%  Weight: 78 lb (35.4 kg)  Height: 4\' 8"  (1.422 m)   Gen: Pleasant, very thin, in no distress,  normal affect  ENT: No lesions,  mouth clear,  oropharynx clear,  no postnasal drip  Neck: No JVD, no stridor  Lungs: No use of accessory muscles, few scattered inspiratory rhonchi, no wheeze or crackles  Cardiovascular: RRR, heart sounds normal, no murmur or gallops, no peripheral edema  Musculoskeletal: No deformities, no cyanosis or clubbing  Neuro: alert, awake, non focal  Skin: Warm, no lesions or rash  Assessment & Plan:   Pulmonary nodules Bilateral scattered nodular infiltrates in an inflammatory pattern.  Discussed the differential diagnosis with her which includes infectious or noninfectious inflammatory infiltrates (consider MAIC, consider autoimmune disease from her Crohn's, etc.).  Also consider malignancy although pattern would be more consistent with metastatic disease than primary lung cancer.  She does not have symptoms.  Etiology unclear.  We discussed bronchoscopy to clarify and she wants to proceed.  We reviewed your CT scan of the chest and your PET scan today. We will arrange for navigational bronchoscopy to evaluate pulmonary nodules.  This will be done as an outpatient under general anesthesia at [facility] endoscopy.  You will need a designated driver and someone to watch you at home on that date after the procedure.  Will try to get this set up for 12/26/2022. Follow with Dr Delton Coombes in 1 month or next available   Levy Pupa, MD, PhD 12/15/2022, 4:24 PM [facility] [phone] or if no answer before 7:00PM call [phone] For any issues after 7:00PM please call eLink [phone]

## 2022-12-15 NOTE — H&P (View-Only) (Signed)
Subjective:    Patient ID: Kim [name], female    DOB: 03-04-1941, 82 y.o.   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  She has had pulmonary nodular disease that has been followed in the past by Dr. Pete Glatter.  This prompted a CT scan of the chest that was ordered by Dr. Arbutus Ped and done 11/02/2022, and then a PET scan on 11/28/2022 as below.  Question of atypical infection versus malignancy.  She is not on anti-inflammatory meds, has not been on for 6-7 years.  She does not have cough or SOB. She is able to exert herself and go to the gym. No fevers, chills, sweats.  She is dealing with the recent death of her husband.   CT chest 11/02/2022 reviewed by me small to moderate pericardial effusion, no mediastinal or hilar adenopathy, bronchiectasis with some associated bronchial wall thickening and tree-in-bud infiltrate particularly in the inferior right upper lobe, superior right lower lobe.  Also noted are some larger more confluent areas of masslike spiculated opacity in the right lung particularly in the posterior inferior right upper lobe and inferior medial right upper lobe.  Finally there are some scattered spiculated nodules noted  PET scan 11/24/2022 reviewed by me, shows that the bilateral pulmonary nodules are intensely hypermetabolic.  There are no hypermetabolic mediastinal nodes   Review of Systems As per HPI  Past Medical History:  Diagnosis Date   Anemia    related to malabsorption--gets B12 shots and iron infusions, managed by Dr. Arbutus Ped   [facility] (basal cell carcinoma of skin)    Crohn disease (HCC)    Dr. Haze Boyden at [facility]   Glaucoma    Dr. Eulah Pont   Intestinal malabsorption    OA (osteoarthritis)    hands,back   Osteoporosis    managed by Dr. 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  Vaping Use   Vaping status: Never Used  Substance and Sexual Activity   Alcohol use: No   Drug use: No   Sexual activity: Not Currently  Other Topics Concern   Not on file  Social History Narrative   Lives with her husband, no pets.  Children in [place] and [place].  Husband has Parkinsons Disease   Social Determinants of Health   Financial Resource Strain: Not on file  Food Insecurity: Not on file  Transportation Needs: Not on file  Physical Activity: Insufficiently Active (01/18/2022)  Exercise Vital Sign    Days of Exercise per Week: 2 days    Minutes of Exercise per Session: 10 min  Stress: Stress Concern Present (01/18/2022)   [employer] of Occupational Health - Occupational Stress Questionnaire    Feeling of Stress : To some extent  Social Connections: Not on file  Intimate Partner Violence: Not on file     Allergies  Allergen Reactions   Antihistamines, Chlorpheniramine-Type     Dry  Mouth, dry eyes-so dry she cannot even talk   Chlorpheniramine Other (See Comments)    Dry  Mouth, dry eyes-so dry she cannot even talk   Metronidazole     Peripheral neuropathy Peripheral neuropathy   Other Diarrhea and Nausea And Vomiting    Patient has an Ileostomy Patient has an Architect, Antihistamine    Sulfa Antibiotics Other (See Comments)    UNKNOWN   Tape Other (See Comments)    SKIN WILL TEAR WITH ANYTHING OTHER THAN PAPER TAPE OR COBAN WRAP     Wound Dressing Adhesive Other (See Comments)    SKIN WILL TEAR WITH ANYTHING OTHER THAN PAPER TAPE OR COBAN WRAP     Outpatient Medications Prior to Visit  Medication Sig Dispense Refill   acetaminophen (TYLENOL) 500 MG tablet Take 1,000 mg by mouth daily. 500 mg tablets     B-D 3CC LUER-LOK SYR 22GX1-1/2 22G X 1-1/2" 3 ML MISC USE AS DIRECTED 3 each PRN   B-D 3CC LUER-LOK SYR 25GX5/8" 25G X 5/8" 3 ML MISC USE AS DIRECTED. 3 each PRN   brimonidine (ALPHAGAN) 0.2 % ophthalmic solution Place 1 drop into both eyes 3 times daily.     Calcium Citrate-Vitamin D 200-125 MG-UNIT TABS Take 1 tablet by mouth 2 (two) times daily.     cholecalciferol (VITAMIN D) 25 MCG (1000 UNIT) tablet Take 2 tablets by mouth daily.     clobetasol cream (TEMOVATE) 0.05 % Apply topically.     cyanocobalamin (VITAMIN B12) 1000 MCG/ML injection INJECT 1 ML IM ONCE A MONTH. 3 mL PRN   denosumab (PROLIA) 60 MG/ML SOSY injection Inject 60 mg into the skin every 6 (six) months.     gabapentin (NEURONTIN) 300 MG capsule Take 300 mg by mouth 2 (two) times daily.     Melatonin 10 MG TBCR Take 10 mg by mouth at bedtime as needed for sleep.     Multiple Vitamins-Minerals (ONE-A-DAY WOMENS 50+ ADVANTAGE) TABS Take 1 tablet by mouth daily.     ROCKLATAN 0.02-0.005 % SOLN Apply 1 drop to eye daily.     triamcinolone cream (KENALOG) 0.1 % Apply 1 application topically 2 (two) times daily.     No facility-administered medications prior to visit.        Objective:   Physical Exam  Vitals:   12/15/22 1509  BP: 116/68  Pulse: (!) 59  Temp: 97.9 F (36.6 C)  TempSrc: Oral  SpO2: 100%  Weight: 78 lb (35.4 kg)  Height: 4\' 8"  (1.422 m)   Gen: Pleasant, very thin, in no distress,  normal affect  ENT: No lesions,  mouth clear,  oropharynx clear,  no postnasal drip  Neck: No JVD, no stridor  Lungs: No use of accessory muscles, few scattered inspiratory rhonchi, no wheeze or crackles  Cardiovascular: RRR, heart sounds normal, no murmur or gallops, no peripheral edema  Musculoskeletal: No deformities, no cyanosis or clubbing  Neuro: alert, awake, non focal  Skin: Warm, no lesions or rash  Assessment & Plan:   Pulmonary nodules Bilateral scattered nodular infiltrates in an inflammatory pattern.  Discussed the differential diagnosis with her which includes infectious or noninfectious inflammatory infiltrates (consider MAIC, consider autoimmune disease from her Crohn's, etc.).  Also consider malignancy although pattern would be more consistent with metastatic disease than primary lung cancer.  She does not have symptoms.  Etiology unclear.  We discussed bronchoscopy to clarify and she wants to proceed.  We reviewed your CT scan of the chest and your PET scan today. We will arrange for navigational bronchoscopy to evaluate pulmonary nodules.  This will be done as an outpatient under general anesthesia at [facility] endoscopy.  You will need a designated driver and someone to watch you at home on that date after the procedure.  Will try to get this set up for 12/26/2022. Follow with Dr Delton Coombes in 1 month or next available   Levy Pupa, MD, PhD 12/15/2022, 4:24 PM [facility] [phone] or if no answer before 7:00PM call [phone] For any issues after 7:00PM please call eLink [phone]

## 2022-12-15 NOTE — Patient Instructions (Addendum)
We reviewed your CT scan of the chest and your PET scan today. We will arrange for navigational bronchoscopy to evaluate pulmonary nodules.  This will be done as an outpatient under general anesthesia at [facility] endoscopy.  You will need a designated driver and someone to watch you at home on that date after the procedure.  Will try to get this set up for 12/26/2022. Follow with Dr Delton Coombes in 1 month or next available

## 2022-12-16 ENCOUNTER — Telehealth: Payer: Self-pay | Admitting: Emergency Medicine

## 2022-12-16 NOTE — Telephone Encounter (Signed)
Thank you :)

## 2022-12-16 NOTE — Telephone Encounter (Signed)
Spoke with pt who wanted to state that her plan for post procedure was to arrange transportation from hospital to [facility] and stay the 24 hours post procedure there where she will be supervised. Pt also stated she may do for mentioned of arrange for someone to watch over her in her home post procedure. Instructions were again explained that pt would needs some to be with her for 24 post bronchoscopy. Pt stated understanding and no further questions at this time.   Routing to Dr. Delton Coombes as Lorain Childes

## 2022-12-21 ENCOUNTER — Telehealth: Payer: Self-pay | Admitting: Emergency Medicine

## 2022-12-21 NOTE — Telephone Encounter (Signed)
Thank you :)

## 2022-12-21 NOTE — Telephone Encounter (Signed)
KARA @ [phone]  Surgery Center calling. No active orders in for this sched surgery. They are asking we put it in Epic. Thanks.

## 2022-12-23 ENCOUNTER — Encounter (HOSPITAL_COMMUNITY): Payer: Self-pay | Admitting: Emergency Medicine

## 2022-12-23 NOTE — Pre-Procedure Instructions (Signed)
PCP - Dr. Misty Stanley Barns Cardiologist - n/a  EKG - n/a CT Chest x-ray - 11/02/22 ECHO - n/a Cardiac Cath - n/a CPAP - n/a  Blood Thinner Instructions: n/a Aspirin Instructions: n/a  ERAS Protcol - n/a COVID TEST- n/a  Anesthesia review: Yes  -------------  SDW INSTRUCTIONS:  Your procedure is scheduled on 12/26/22. Please report to [facility] Main Entrance "A" at 0730 A.M., and check in at the Admitting office. Call this number if you have problems the morning of surgery: [phone]   Remember: Do not eat or drink after midnight the night before your surgery    Medications to take morning of surgery with a sip of water include: Tylenol  Pt instructed to stop all vitamins today, she was asked to only continue with Tylenol, Tymlos, Gabapentin, & Eye drops. Each medication gone through with patient.   As of today, STOP taking any Aspirin (unless otherwise instructed by your surgeon), Aleve, Naproxen, Ibuprofen, Motrin, Advil, Goody's, BC's, all herbal medications, fish oil, and all vitamins.    The Morning of Surgery Do not wear jewelry, make-up or nail polish. Do not wear lotions, powders, or perfumes/colognes, or deodorant Do not bring valuables to the hospital. [facility] is not responsible for any belongings or valuables.  If you are a smoker, DO NOT Smoke 24 hours prior to surgery  If you wear a CPAP at night please bring your mask the morning of surgery   Remember that you must have someone to transport you home after your surgery, and remain with you for 24 hours if you are discharged the same day.  Please bring cases for contacts, glasses, hearing aids, dentures or bridgework because it cannot be worn into surgery.   Patients discharged the day of surgery will not be allowed to drive home.   Please shower the NIGHT BEFORE/MORNING OF SURGERY (use antibacterial soap like DIAL soap if possible). Wear comfortable clothes the morning of surgery. Oral Hygiene is also  important to reduce your risk of infection.  Remember - BRUSH YOUR TEETH THE MORNING OF SURGERY WITH YOUR REGULAR TOOTHPASTE  Patient denies shortness of breath, fever, cough and chest pain.

## 2022-12-26 ENCOUNTER — Encounter (HOSPITAL_COMMUNITY): Payer: Self-pay | Admitting: Emergency Medicine

## 2022-12-26 ENCOUNTER — Ambulatory Visit (HOSPITAL_BASED_OUTPATIENT_CLINIC_OR_DEPARTMENT_OTHER): Payer: Medicare PPO | Admitting: Certified Registered"

## 2022-12-26 ENCOUNTER — Other Ambulatory Visit: Payer: Self-pay

## 2022-12-26 ENCOUNTER — Ambulatory Visit (HOSPITAL_COMMUNITY): Payer: Medicare PPO

## 2022-12-26 ENCOUNTER — Ambulatory Visit (HOSPITAL_COMMUNITY)
Admission: RE | Admit: 2022-12-26 | Discharge: 2022-12-26 | Disposition: A | Discharge status: Home / Self Care | Payer: Medicare PPO | Attending: Emergency Medicine | Admitting: Emergency Medicine

## 2022-12-26 ENCOUNTER — Ambulatory Visit (HOSPITAL_COMMUNITY): Payer: Medicare PPO | Admitting: Certified Registered"

## 2022-12-26 ENCOUNTER — Encounter (HOSPITAL_COMMUNITY)
Admission: RE | Disposition: A | Discharge status: Home / Self Care | Payer: Self-pay | Source: Home / Self Care | Attending: Emergency Medicine

## 2022-12-26 DIAGNOSIS — Z79899 Other long term (current) drug therapy: Secondary | ICD-10-CM | POA: Diagnosis not present

## 2022-12-26 DIAGNOSIS — M1990 Unspecified osteoarthritis, unspecified site: Secondary | ICD-10-CM | POA: Insufficient documentation

## 2022-12-26 DIAGNOSIS — D631 Anemia in chronic kidney disease: Secondary | ICD-10-CM | POA: Diagnosis not present

## 2022-12-26 DIAGNOSIS — N183 Chronic kidney disease, stage 3 unspecified: Secondary | ICD-10-CM | POA: Diagnosis not present

## 2022-12-26 DIAGNOSIS — R918 Other nonspecific abnormal finding of lung field: Secondary | ICD-10-CM

## 2022-12-26 DIAGNOSIS — G709 Myoneural disorder, unspecified: Secondary | ICD-10-CM | POA: Diagnosis not present

## 2022-12-26 DIAGNOSIS — G4730 Sleep apnea, unspecified: Secondary | ICD-10-CM | POA: Diagnosis not present

## 2022-12-26 DIAGNOSIS — Z85828 Personal history of other malignant neoplasm of skin: Secondary | ICD-10-CM | POA: Insufficient documentation

## 2022-12-26 DIAGNOSIS — K5090 Crohn's disease, unspecified, without complications: Secondary | ICD-10-CM | POA: Insufficient documentation

## 2022-12-26 DIAGNOSIS — M810 Age-related osteoporosis without current pathological fracture: Secondary | ICD-10-CM | POA: Diagnosis not present

## 2022-12-26 DIAGNOSIS — Z87891 Personal history of nicotine dependence: Secondary | ICD-10-CM | POA: Diagnosis not present

## 2022-12-26 DIAGNOSIS — A318 Other mycobacterial infections: Secondary | ICD-10-CM | POA: Diagnosis present

## 2022-12-26 DIAGNOSIS — Z48813 Encounter for surgical aftercare following surgery on the respiratory system: Secondary | ICD-10-CM | POA: Diagnosis not present

## 2022-12-26 DIAGNOSIS — E538 Deficiency of other specified B group vitamins: Secondary | ICD-10-CM | POA: Insufficient documentation

## 2022-12-26 DIAGNOSIS — R911 Solitary pulmonary nodule: Secondary | ICD-10-CM | POA: Diagnosis not present

## 2022-12-26 HISTORY — PX: BRONCHIAL BIOPSY: SHX5109

## 2022-12-26 HISTORY — PX: BRONCHIAL NEEDLE ASPIRATION BIOPSY: SHX5106

## 2022-12-26 HISTORY — PX: BRONCHIAL BRUSHINGS: SHX5108

## 2022-12-26 HISTORY — PX: BRONCHIAL WASHINGS: SHX5105

## 2022-12-26 LAB — AEROBIC/ANAEROBIC CULTURE W GRAM STAIN (SURGICAL/DEEP WOUND): Gram Stain: NONE SEEN

## 2022-12-26 LAB — CULTURE, RESPIRATORY W GRAM STAIN

## 2022-12-26 LAB — CULTURE, BAL-QUANTITATIVE W GRAM STAIN

## 2022-12-26 SURGERY — BRONCHOSCOPY, WITH BIOPSY USING ELECTROMAGNETIC NAVIGATION
Anesthesia: General

## 2022-12-26 MED ORDER — DEXAMETHASONE SODIUM PHOSPHATE 10 MG/ML IJ SOLN
INTRAMUSCULAR | Status: DC | PRN
  Administered 2022-12-26: 4 mg via INTRAVENOUS

## 2022-12-26 MED ORDER — LIDOCAINE 2% (20 MG/ML) 5 ML SYRINGE
INTRAMUSCULAR | Status: DC | PRN
  Administered 2022-12-26: 40 mg via INTRAVENOUS

## 2022-12-26 MED ORDER — FENTANYL CITRATE (PF) 100 MCG/2ML IJ SOLN: 25.0000 ug | INTRAMUSCULAR | Status: DC | PRN

## 2022-12-26 MED ORDER — LACTATED RINGERS IV SOLN: INTRAVENOUS | Status: DC

## 2022-12-26 MED ORDER — ROCURONIUM BROMIDE 10 MG/ML (PF) SYRINGE
PREFILLED_SYRINGE | INTRAVENOUS | Status: DC | PRN
  Administered 2022-12-26: 40 mg via INTRAVENOUS

## 2022-12-26 MED ORDER — PROPOFOL 10 MG/ML IV BOLUS
INTRAVENOUS | Status: DC | PRN
  Administered 2022-12-26: 60 mg via INTRAVENOUS

## 2022-12-26 MED ORDER — SUGAMMADEX SODIUM 200 MG/2ML IV SOLN
INTRAVENOUS | Status: DC | PRN
  Administered 2022-12-26: 150 mg via INTRAVENOUS

## 2022-12-26 MED ORDER — PROPOFOL 500 MG/50ML IV EMUL
INTRAVENOUS | Status: DC | PRN
  Administered 2022-12-26: 178 ug/kg/min via INTRAVENOUS

## 2022-12-26 MED ORDER — ONDANSETRON HCL 4 MG/2ML IJ SOLN
INTRAMUSCULAR | Status: DC | PRN
  Administered 2022-12-26: 4 mg via INTRAVENOUS

## 2022-12-26 NOTE — Discharge Instructions (Signed)
Flexible Bronchoscopy, Care After This sheet gives you information about how to care for yourself after your test. Your doctor may also give you more specific instructions. If you have problems or questions, contact your doctor. Follow these instructions at home: Eating and drinking When your numbness is gone and your cough and gag reflexes have come back, you may: Eat only soft foods. Slowly drink liquids. The day after the test, go back to your normal diet. Driving Do not drive for 24 hours if you were given a medicine to help you relax (sedative). Do not drive or use heavy machinery while taking prescription pain medicine. General instructions  Take over-the-counter and prescription medicines only as told by your doctor. Return to your normal activities as told. Ask what activities are safe for you. Do not use any products that have nicotine or tobacco in them. This includes cigarettes and e-cigarettes. If you need help quitting, ask your doctor. Keep all follow-up visits as told by your doctor. This is important. It is very important if you had a tissue sample (biopsy) taken. Get help right away if: You have shortness of breath that gets worse. You get light-headed. You feel like you are going to pass out (faint). You have chest pain. You cough up: More than a little blood. More blood than before. Summary Do not eat or drink anything (not even water) for 2 hours after your test, or until your numbing medicine wears off. Do not use cigarettes. Do not use e-cigarettes. Get help right away if you have chest pain.  Please call our office for any questions or concerns.  [phone].  This information is not intended to replace advice given to you by your health care provider. Make sure you discuss any questions you have with your health care provider. Document Released: 03/20/2009 Document Revised: 05/05/2017 Document Reviewed: 06/10/2016 Elsevier Patient Education  2020 [employer].

## 2022-12-26 NOTE — Transfer of Care (Signed)
Immediate Anesthesia Transfer of Care Note  Patient: Kim [name]  Procedure(s) Performed: ROBOTIC ASSISTED NAVIGATIONAL BRONCHOSCOPY BRONCHIAL WASHINGS BRONCHIAL BIOPSIES BRONCHIAL NEEDLE ASPIRATION BIOPSIES BRONCHIAL BRUSHINGS  Patient Location: PACU  Anesthesia Type:General  Level of Consciousness: awake  Airway & Oxygen Therapy: Patient Spontanous Breathing and Patient connected to nasal cannula oxygen  Post-op Assessment: Report given to RN and Post -op Vital signs reviewed and stable  Post vital signs: Reviewed and stable  Last Vitals:  Vitals Value Taken Time  BP 133/54 12/26/22 1109  Temp 36.6 C 12/26/22 1109  Pulse 66 12/26/22 1114  Resp 20 12/26/22 1114  SpO2 99 % 12/26/22 1114  Vitals shown include unfiled device data.  Last Pain:  Vitals:   12/26/22 0751  PainSc: 0-No pain      Patients Stated Pain Goal: 0 (12/26/22 0751)  Complications: No notable events documented.

## 2022-12-26 NOTE — Anesthesia Preprocedure Evaluation (Addendum)
Anesthesia Evaluation  Patient identified by MRN, date of birth, ID band Patient awake    Reviewed: Allergy & Precautions, NPO status , Patient's Chart, lab work & pertinent test results  History of Anesthesia Complications Negative for: history of anesthetic complications  Airway Mallampati: II  TM Distance: >3 FB Neck ROM: Full    Dental  (+) Teeth Intact, Dental Advisory Given   Pulmonary shortness of breath, neg COPD, former smoker    + decreased breath sounds      Cardiovascular negative cardio ROS  Rhythm:Regular     Neuro/Psych neg Seizures  Neuromuscular disease  negative psych ROS   GI/Hepatic negative GI ROS, Neg liver ROS,,,  Endo/Other    Renal/GU Renal InsufficiencyRenal diseaseLab Results      Component                Value               Date                      NA                       140                 12/05/2022                K                        4.2                 12/05/2022                CO2                      26                  12/05/2022                GLUCOSE                  74                  12/05/2022                BUN                      34 (H)              12/05/2022                CREATININE               1.66 (H)            12/05/2022                CALCIUM                  9.4                 12/05/2022                EGFR                     36 (L)              11/17/2016  GFRNONAA                 31 (L)              12/05/2022                Musculoskeletal  (+) Arthritis ,    Abdominal   Peds  Hematology  (+) Blood dyscrasia, anemia   Anesthesia Other Findings   Reproductive/Obstetrics                             Anesthesia Physical Anesthesia Plan  ASA: 3  Anesthesia Plan: General   Post-op Pain Management: Minimal or no pain anticipated   Induction: Intravenous  PONV Risk Score and Plan: 3 and TIVA, Propofol  infusion and Treatment may vary due to age or medical condition  Airway Management Planned: Oral ETT  Additional Equipment: None  Intra-op Plan:   Post-operative Plan: Extubation in OR  Informed Consent: I have reviewed the patients History and Physical, chart, labs and discussed the procedure including the risks, benefits and alternatives for the proposed anesthesia with the patient or authorized representative who has indicated his/her understanding and acceptance.     Dental advisory given  Plan Discussed with: CRNA  Anesthesia Plan Comments:        Anesthesia Quick Evaluation

## 2022-12-26 NOTE — Research (Signed)
Title: A multi-center, prospective, single-arm, observational study to evaluate real-world outcomes for the shape-sensing Ion endoluminal system  Primary Outcome: Evaluate procedure characteristics and short and long-term patient outcomes following shape-sensing robotic-assisted bronchoscopy (ssRAB) utilizing the Ion Endoluminal System for lung lesion localization or biopsy.   Protocol # / Study Name: ISI-ION-003 Clinical Trials #: [id number] Sponsor: [employer] Principal Investigator: Dr. Elige Radon Icard   Key Features of Ion Endoluminal System (referred to as "Ion") Ion is the first FDA cleared bronchoscopy system that uses fiber optic shape sensing technology to inform on location within the airways. Its catheter/tool channel has a smaller outer diameter (3.5 mm) in comparison to conventional bronchoscopes, allowing it to navigate into the smaller airways of the periphery.     Key Inclusion Criteria Subject is 18 years or older at the time of the procedure Subject is a candidate for a planned, elective RAB lung lesion localization or biopsy procedure in which the Ion Endoluminal System is planned to be utilized.  Subject  able to understand and adhere to study requirements and provide informed consent.   Key Exclusion Criteria Subject is under the care of a Museum/gallery exhibitions officer and is unable to provide informed consent on their own accord.  Subject is participating in an interventional research study or research study with investigational agents with an unknown safety profile that would interfere with participation or the results of this study.  Female subjects who are pregnant or nursing at the time of the index Ion procedure, as determined by standard site practices. Subjects that are incarcerated or institutionalized under court order, or other vulnerable populations.    Previous Clinical Trials Since receiving FDA clearance in Feb 2019, Ion has been adopted  commercially by over 226 centers in the [place], and utilized in over 40,000 procedures.  The first in-human study enrolled 13 subjects with a mean lesion size of 14.8 mm and the overall diagnostic yield was 79.3%, with no adverse events. 17 (58.6%) lesions were reported to have a bronchus sign available on CT imaging.  A multi-center study published results in 2022, with 270 lesions biopsied in 241 patients using Ion. The mean largest cardinal lesion size was 18.86.65mm, and the mean airway generation count was 7.01.6. Asymptomatic pneumothorax occurred in 3.3% of subjects, and 0.8% experienced airway bleeding.   Another study provided preliminary results in 2022, with 87% sensitivity for malignancy, a diagnostic yield of 81%, and a mean lesion size of 16 mm. 75% of biopsy cases were bronchus-sign negative. 4% of subjects experienced pneumothoraces (including those requiring intervention), and 0.8% of subjects experienced airway bleeding requiring wedging or balloon tamponade.  A single-center study captured 131 consecutive procedures of pulmonary biopsy using Ion. The navigational success rate was 98.7%, with an overall diagnostic yield of 81.7%, an overall complication rate of 3%, and a pneumothorax rate of 1.5%.   [facility] Clinical Research Coordinator note:   This visit for Subject Kim [name] with DOB: [birth year] on 12/26/2022 for the above protocol is Visit/Encounter # Pre-procedure, Intra-procedure and Post-procedure, and is for purpose of research.   The consent for this encounter is under:  Protocol Version 1.0 Investigator Brochure Version N/A Consent Version Revision A, dated 14Nov2023 and is currently IRB approved.   Kim [name] expressed continued interest and consent in continuing as a study subject. Subject confirmed that there was no change in contact information (e.g. address, telephone, email). Subject thanked for participation in research and contribution  to science. In  this visit 12/26/2022 the subject will be evaluated by Sub-Investigator named Dr. Delton Coombes. This research coordinator has verified that the above investigator is up to date with his/her training logs.   The Subject was informed that the PI continues to have oversight of the subject's visits and course through relevant discussions, reviews, and also specifically of this visit by routing of this note to the PI.  The research study was discussed with the subject in the pre-operative room. The study was explained in detail including all the contents of the informed consent document. The subject was encouraged to ask questions. All questions were answered to their satisfaction. The IRB approved informed consent was signed, and a copy was given to the subject. After obtaining consent, the subject underwent scheduled procedure using the ion endoluminal system. Data collection was completed per protocol. Refer to paper source subject binder for further details.      Signed by  Verdene Lennert Clinical Research Coordinator / Sub-Investigator  [facility]  [place], [place] 11:31 AM 12/26/2022

## 2022-12-26 NOTE — Interval H&P Note (Signed)
History and Physical Interval Note:  12/26/2022 8:39 AM  Kim [name]  has presented today for surgery, with the diagnosis of BILATERAL PULMONARY NODULES.  The various methods of treatment have been discussed with the patient and family. After consideration of risks, benefits and other options for treatment, the patient has consented to  Procedure(s): ROBOTIC ASSISTED NAVIGATIONAL BRONCHOSCOPY (N/A) as a surgical intervention.  The patient's history has been reviewed, patient examined, no change in status, stable for surgery.  I have reviewed the patient's chart and labs.  Questions were answered to the patient's satisfaction.     Leslye Peer

## 2022-12-26 NOTE — Op Note (Signed)
Video Bronchoscopy with Robotic Assisted Bronchoscopic Navigation   Date of Operation: 12/26/2022   Pre-op Diagnosis: Bilateral nodular pulmonary infiltrates  Post-op Diagnosis: Same  Surgeon: Levy Pupa  Assistants: None  Anesthesia: General endotracheal anesthesia  Operation: Flexible video fiberoptic bronchoscopy with robotic assistance and biopsies.  Estimated Blood Loss: Minimal  Complications: None  Indications and History: Kim [name] is a 82 y.o. female with history of chronic disease with an ileostomy, relative malnutrition.  She was found to have bilateral nodular pulmonary infiltrates on CT scan of the chest, particularly notable in the right upper lobe.  Recommendation made to get culture data and receive a tissue diagnosis via robotic assisted navigational bronchoscopy.. The risks, benefits, complications, treatment options and expected outcomes were discussed with the patient.  The possibilities of pneumothorax, pneumonia, reaction to medication, pulmonary aspiration, perforation of a viscus, bleeding, failure to diagnose a condition and creating a complication requiring transfusion or operation were discussed with the patient who freely signed the consent.    Description of Procedure: The patient was seen in the Preoperative Area, was examined and was deemed appropriate to proceed.  The patient was taken to [facility] endoscopy room 3, identified as Malachy Chamber and the procedure verified as Flexible Video Fiberoptic Bronchoscopy.  A Time Out was held and the above information confirmed.   Prior to the date of the procedure a high-resolution CT scan of the chest was performed. Utilizing ION software program a virtual tracheobronchial tree was generated to allow the creation of distinct navigation pathways to the patient's parenchymal abnormalities. After being taken to the operating room general anesthesia was initiated and the patient  was orally intubated. The video  fiberoptic bronchoscope was introduced via the endotracheal tube and a general inspection was performed which showed thick dark yellowish-green secretions at the main carina, right mainstem bronchus, emanating from the right upper lobe airways.  Endobronchial brushings were performed on focus of airway inflammation and mucus in the right mainstem bronchus just proximal to the right upper lobe takeoff.  Secretions were suctioned and mucosa was somewhat irritable with some mild oozing. Robotic catheter inserted into patient's endotracheal tube.   Target #1 right upper lobe pulmonary nodule: The distinct navigation pathways prepared prior to this procedure were then utilized to navigate to patient's lesion identified on CT scan. The robotic catheter was secured into place and the vision probe was withdrawn.  Lesion location was approximated using fluoroscopy.  Cios three-dimensional imaging was performed to localize the target.  This confirmed a significant decrease in size of both right upper lobe nodular infiltrates.  The more lateral posterior right upper lobe nodule was targeted.  Under fluoroscopic guidance transbronchial needle brushings, transbronchial needle biopsies, and transbronchial forceps biopsies were performed to be sent for cytology and pathology.  A needle sample was also sent for microbiology.  A bronchioalveolar lavage was performed in the right upper lobe adjacent to the nodular infiltrate and sent for microbiology.  At the end of the procedure a general airway inspection was performed and there was no evidence of active bleeding. The bronchoscope was removed.  The patient tolerated the procedure well. There was no significant blood loss and there were no obvious complications. A post-procedural chest x-ray is pending.  Samples Target #1: 1. Transbronchial needle brushings from right upper lobe nodular infiltrate 2. Transbronchial Wang needle biopsies from right upper lobe nodular  infiltrate 3. Transbronchial forceps biopsies from right upper lobe nodular infiltrate 4. Bronchoalveolar lavage from right upper lobe  5. Endobronchial brushings from right mainstem bronchus   Plans:  The patient will be discharged from the PACU to home when recovered from anesthesia and after chest x-ray is reviewed. We will review the cytology, pathology and microbiology results with the patient when they become available. Outpatient followup will be with Dr. Delton Coombes and Dr. Arbutus Ped.    Levy Pupa, MD, PhD 12/26/2022, 11:03 AM [facility] [phone] or if no answer before 7:00PM call [phone] For any issues after 7:00PM please call eLink [phone]

## 2022-12-26 NOTE — Anesthesia Procedure Notes (Signed)
Procedure Name: Intubation Date/Time: 12/26/2022 10:16 AM  Performed by: Elliot Dally, CRNAPre-anesthesia Checklist: Patient identified, Emergency Drugs available, Suction available and Patient being monitored Patient Re-evaluated:Patient Re-evaluated prior to induction Oxygen Delivery Method: Circle System Utilized Preoxygenation: Pre-oxygenation with 100% oxygen Induction Type: IV induction Ventilation: Mask ventilation without difficulty Laryngoscope Size: Miller and 2 Grade View: Grade I Tube type: Oral Number of attempts: 1 Airway Equipment and Method: Stylet and Oral airway Placement Confirmation: ETT inserted through vocal cords under direct vision, positive ETCO2 and breath sounds checked- equal and bilateral Tube secured with: Tape Dental Injury: Teeth and Oropharynx as per pre-operative assessment

## 2022-12-26 NOTE — Anesthesia Postprocedure Evaluation (Signed)
Anesthesia Post Note  Patient: Kim [name]  Procedure(s) Performed: ROBOTIC ASSISTED NAVIGATIONAL BRONCHOSCOPY BRONCHIAL WASHINGS BRONCHIAL BIOPSIES BRONCHIAL NEEDLE ASPIRATION BIOPSIES BRONCHIAL BRUSHINGS     Patient location during evaluation: PACU Anesthesia Type: General Level of consciousness: awake and alert Pain management: pain level controlled Vital Signs Assessment: post-procedure vital signs reviewed and stable Respiratory status: spontaneous breathing, nonlabored ventilation and respiratory function stable Cardiovascular status: blood pressure returned to baseline and stable Postop Assessment: no apparent nausea or vomiting Anesthetic complications: no   No notable events documented.  Last Vitals:  Vitals:   12/26/22 1215 12/26/22 1230  BP: (!) 120/50 (!) 138/52  Pulse: (!) 52 (!) 50  Resp: 12 12  Temp:  36.6 C  SpO2: 96% 98%    Last Pain:  Vitals:   12/26/22 1230  PainSc: 0-No pain                 Kim [name]

## 2022-12-27 ENCOUNTER — Encounter (HOSPITAL_COMMUNITY): Payer: Self-pay | Admitting: Emergency Medicine

## 2022-12-27 LAB — CULTURE, BAL-QUANTITATIVE W GRAM STAIN

## 2022-12-27 LAB — AEROBIC/ANAEROBIC CULTURE W GRAM STAIN (SURGICAL/DEEP WOUND)

## 2022-12-27 LAB — CULTURE, RESPIRATORY W GRAM STAIN

## 2022-12-28 ENCOUNTER — Encounter: Payer: Medicare PPO | Admitting: Internal Medicine

## 2022-12-28 LAB — ACID FAST SMEAR (AFB, MYCOBACTERIA)
Acid Fast Smear: NEGATIVE
Acid Fast Smear: NEGATIVE

## 2022-12-28 LAB — CULTURE, RESPIRATORY W GRAM STAIN

## 2022-12-28 LAB — CULTURE, BAL-QUANTITATIVE W GRAM STAIN

## 2022-12-28 LAB — AEROBIC/ANAEROBIC CULTURE W GRAM STAIN (SURGICAL/DEEP WOUND)

## 2022-12-28 LAB — CYTOLOGY - NON PAP

## 2022-12-29 ENCOUNTER — Telehealth: Payer: Self-pay | Admitting: Emergency Medicine

## 2022-12-29 LAB — AEROBIC/ANAEROBIC CULTURE W GRAM STAIN (SURGICAL/DEEP WOUND)

## 2022-12-29 NOTE — Telephone Encounter (Signed)
Reviewed bronchoscopy results with the patient.  Shows granulomatous inflammation.  I did get a message from the microbiology lab that they are suspicious for Mycobacterium abscessus.  The confirmatory testing is pending.  We will follow those cultures, follow-up in August as planned and then decide whether treatment is merited.

## 2022-12-29 NOTE — Telephone Encounter (Signed)
Spoke with patient regarding prior message. Patient stated she was very upset and wanted her Bronch result's. Patient stated she can not wait until 01/27/23 to get the result's if she has to wait that long she will not come in . Patient stated she has been very stressed and she went on about how long result's will take and why is the lab taking so long to get the result's . Patient also stated to me how her husband passed away a months ago and she was a Chartered loss adjuster .   Dr.Byrum can you please advise .   Thank you

## 2022-12-29 NOTE — Telephone Encounter (Signed)
PT wonders if she has cancer or not. Dr. Delton Coombes said he'd call after the bronc. Performed recently. She is on pins and needles. Pls call @ [phone]

## 2023-01-04 ENCOUNTER — Encounter (INDEPENDENT_AMBULATORY_CARE_PROVIDER_SITE_OTHER): Payer: Self-pay

## 2023-01-04 ENCOUNTER — Telehealth: Payer: Self-pay | Admitting: Emergency Medicine

## 2023-01-04 NOTE — Telephone Encounter (Signed)
Patient called to cancel her appointment for 8/23. She was requesting to reschedule, but [facility] is booked for August and September. She did not want to see a Freight forwarder. She is asking if she can get worked in as RB is montoring pathology results for her. Please advise.

## 2023-01-04 NOTE — Telephone Encounter (Signed)
Don't use a held spot. If she does not want to be seen on 8/23 then put her in my next available 15 min opening

## 2023-01-04 NOTE — Telephone Encounter (Signed)
Dr. Delton Coombes since patient is not wanting to see another provider are you okay with [place] using a held spot?

## 2023-01-06 ENCOUNTER — Telehealth: Payer: Self-pay | Admitting: Emergency Medicine

## 2023-01-06 NOTE — Telephone Encounter (Signed)
Pt. Calling back and see if she needs to have further Ct scans if it can be done on a no hosp/setting and she also called about Dr. Zoila Shutter her on a antibiotic but she has a delicate GI problems and wants to know what the Dr. Think is best to do at this point

## 2023-01-10 DIAGNOSIS — K5090 Crohn's disease, unspecified, without complications: Secondary | ICD-10-CM | POA: Diagnosis not present

## 2023-01-10 DIAGNOSIS — Z932 Ileostomy status: Secondary | ICD-10-CM | POA: Diagnosis not present

## 2023-01-10 DIAGNOSIS — K50011 Crohn's disease of small intestine with rectal bleeding: Secondary | ICD-10-CM | POA: Diagnosis not present

## 2023-01-10 DIAGNOSIS — Z933 Colostomy status: Secondary | ICD-10-CM | POA: Diagnosis not present

## 2023-01-10 DIAGNOSIS — C189 Malignant neoplasm of colon, unspecified: Secondary | ICD-10-CM | POA: Diagnosis not present

## 2023-01-17 ENCOUNTER — Ambulatory Visit: Payer: Medicare PPO

## 2023-01-17 VITALS — Ht <= 58 in | Wt 80.0 lb

## 2023-01-17 DIAGNOSIS — Z0000 Encounter for general adult medical examination without abnormal findings: Secondary | ICD-10-CM

## 2023-01-17 NOTE — Progress Notes (Deleted)
Subjective:   Kim [name] is a 82 y.o. female who presents for an Initial Medicare Annual Wellness Visit.  Visit Complete: Virtual  I connected with  Kim [name] on 01/17/23 by a audio enabled telemedicine application and verified that I am speaking with the correct person using two identifiers.  Patient Location: Home  Provider Location: Home Office  I discussed the limitations of evaluation and management by telemedicine. The patient expressed understanding and agreed to proceed.  Vital Signs: Vital signs are patient reported.   Review of Systems    Cardiac Risk Factors include: advanced age (>36men, >73 women)     Objective:    Today's Vitals   01/17/23 1307  Weight: 80 lb (36.3 kg)  Height: 4\' 8"  (1.422 m)   Body mass index is 17.94 kg/m.     01/17/2023    1:17 PM 12/26/2022    7:55 AM 09/17/2020   11:18 AM 01/17/2018   11:46 AM 01/17/2018   11:33 AM 12/05/2016   11:28 AM 03/05/2016   11:36 AM  Advanced Directives  Does Patient Have a Medical Advance Directive? Yes Yes -- No No No No  Type of Estate agent of [place];Living will Healthcare Power of [place];Living will       Copy of Healthcare Power of Attorney in Chart? No - copy requested No - copy requested       Would patient like information on creating a medical advance directive?    No - Patient declined No - Patient declined No - Patient declined     Current Medications (verified) Outpatient Encounter Medications as of 01/17/2023  Medication Sig   Abaloparatide (TYMLOS) 3120 MCG/1.56ML SOPN Inject 1.56 mLs into the skin at bedtime.   acetaminophen (TYLENOL) 500 MG tablet Take 1,000 mg by mouth every morning.   B-D 3CC LUER-LOK SYR 22GX1-1/2 22G X 1-1/2" 3 ML MISC USE AS DIRECTED   B-D 3CC LUER-LOK SYR 25GX5/8" 25G X 5/8" 3 ML MISC USE AS DIRECTED.   brimonidine (ALPHAGAN) 0.2 % ophthalmic solution Place 1 drop into both eyes 3 (three) times daily.   Calcium Citrate-Vitamin  D 200-125 MG-UNIT TABS Take 1-2 tablets by mouth See admin instructions. Take 2 tab in the morning and 1 tab at bedtime   Cholecalciferol (VITAMIN D3) 50 MCG (2000 UT) TABS Take 2,000 Units by mouth daily.   cyanocobalamin (VITAMIN B12) 1000 MCG/ML injection INJECT 1 ML IM ONCE A MONTH. (Patient taking differently: Inject 1,000 mcg into the skin every 30 (thirty) days.)   dorzolamide-timolol (COSOPT) 2-0.5 % ophthalmic solution Place 1 drop into both eyes 2 (two) times daily.   gabapentin (NEURONTIN) 300 MG capsule Take 600 mg by mouth at bedtime.   loratadine (CLARITIN) 10 MG tablet Take 10 mg by mouth daily. [place]   Melatonin 10 MG TBCR Take 10 mg by mouth at bedtime.   Multiple Vitamins-Minerals (ONE-A-DAY WOMENS 50+ ADVANTAGE) TABS Take 1 tablet by mouth daily.   ROCKLATAN 0.02-0.005 % SOLN Place 1 drop into both eyes daily.   triamcinolone cream (KENALOG) 0.1 % Apply 1 application  topically every other day.   [DISCONTINUED] potassium chloride 40 MEQ/15ML (20%) LIQD Take 15 mLs (40 mEq total) by mouth as directed.   No facility-administered encounter medications on file as of 01/17/2023.    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 Arbutus Ped   [facility] (basal cell carcinoma of skin)    Crohn disease (HCC)    Dr. Haze Boyden at [facility]   Glaucoma    Dr. Eulah Pont   Intestinal malabsorption    OA (osteoarthritis)    hands,back   Osteoporosis    managed by Dr. Margo Aye at [place], [place] q2 yrs   Past Surgical History:  Procedure Laterality Date   BREAST EXCISIONAL BIOPSY Right    BRONCHIAL BIOPSY  12/26/2022   Procedure: BRONCHIAL BIOPSIES;  Surgeon: Leslye Peer, MD;  Location: [facility] ENDOSCOPY;  Service:  Pulmonary;;   BRONCHIAL BRUSHINGS  12/26/2022   Procedure: BRONCHIAL BRUSHINGS;  Surgeon: Leslye Peer, MD;  Location: [facility] ENDOSCOPY;  Service: Pulmonary;;   BRONCHIAL NEEDLE ASPIRATION BIOPSY  12/26/2022   Procedure: BRONCHIAL NEEDLE ASPIRATION BIOPSIES;  Surgeon: Leslye Peer, MD;  Location[facility] ENDOSCOPY;  Service: Pulmonary;;   BRONCHIAL WASHINGS  12/26/2022   Procedure: BRONCHIAL WASHINGS;  Surgeon: Leslye Peer, MD;  Location: [facility] ENDOSCOPY;  Service: Pulmonary;;   Crohn  2009   internal ostomy pouch  1994   INTRAMEDULLARY (IM) NAIL INTERTROCHANTERIC Left 03/05/2016   Procedure: INTRAMEDULLARY (IM) NAIL INTERTROCHANTRIC;  Surgeon: Yolonda Kida, MD;  Location: [facility] OR;  Service: Orthopedics;  Laterality: Left;   REDUCTION MAMMAPLASTY Bilateral 01/2017   TOTAL COLECTOMY  1984   due to Crohn's   Family History  Problem Relation Age of Onset   Diabetes Mother        borderline   Heart disease Mother        MI   Heart disease Father    Thyroid disease Sister    Social History   Socioeconomic History   Marital status: Widowed    Spouse name: Not on file   Number of children: 2   Years of education: Not on file   Highest education level: Not on file  Occupational History   Occupation: attorney, 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  Vaping Use   Vaping status: Never Used  Substance and Sexual Activity   Alcohol use: No   Drug use: No   Sexual activity: Not Currently  Other Topics Concern   Not on file  Social History Narrative   Husband passed away end of December 07, 2022, no pets.  Children in [place] and [place].  Husband has Parkinsons Disease   Social Determinants of Health   Financial Resource Strain: Not on file  Food Insecurity: Not on file   Transportation Needs: Not on file  Physical Activity: Insufficiently Active (01/18/2022)   Exercise Vital Sign    Days of Exercise per Week: 2 days    Minutes of Exercise per Session: 10 min  Stress: Stress Concern Present (01/18/2022)   [employer] of Occupational Health - Occupational Stress Questionnaire    Feeling of Stress : To some extent  Social Connections: Not on file    Tobacco Counseling Counseling given: Not Answered Tobacco comments: Pt declines to answer this question.   Clinical Intake:  Pre-visit preparation completed: Yes        BMI - recorded: 17.94 Nutritional Status: BMI <19  Underweight Nutritional Risks:  None Diabetes: No  How often do you need to have someone help you when you read instructions, pamphlets, or other written materials from your doctor or pharmacy?: 3 - Sometimes (Has Glaucoma)  Interpreter Needed?: No  Information entered by ::  , RMA   Activities of Daily Living    01/17/2023    1:10 PM  In your present state of health, do you have any difficulty performing the following activities:  Hearing? 0  Vision? 1  Difficulty concentrating or making decisions? 0  Walking or climbing stairs? 1  Comment sometimes, tries to aviod stairs  Dressing or bathing? 0  Doing errands, shopping? 1  Comment uses Wellsite geologist and eating ? N  Using the Toilet? N  In the past six months, have you accidently leaked urine? N  Comment Ileostomy  Do you have problems with loss of bowel control? N  Managing your Medications? N  Managing your Finances? N  Housekeeping or managing your Housekeeping? N    Patient Care Team: Pincus Sanes, MD as PCP - General (Internal Medicine) Si Gaul, MD (Hematology) Riccardo Dubin, MD (Gastroenterology) Isaac Bliss, MD (Dermatology) Janet Berlin, MD (Ophthalmology)  Indicate any recent Medical Services you may have received from other than [facility] providers in the  past year (date may be approximate).     Assessment:   This is a routine wellness examination for Jykeria.  Hearing/Vision screen Hearing Screening - Comments:: Denies hearing difficulties   Vision Screening - Comments:: Has glaucoma  Dietary issues and exercise activities discussed:     Goals Addressed   None   Depression Screen    03/18/2013   11:01 AM  PHQ 2/9 Scores  PHQ - 2 Score 0    Fall Risk    01/17/2023    1:18 PM 06/21/2021    2:15 PM 05/19/2014   11:00 AM 03/17/2014   11:07 AM 03/18/2013   11:01 AM  Fall Risk   Falls in the past year? 0 0 No No No  Number falls in past yr: 0 0     Injury with Fall? 0 0     Risk for fall due to : No Fall Risks No Fall Risks     Follow up Falls prevention discussed;Falls evaluation completed Falls evaluation completed       MEDICARE RISK AT HOME:  Medicare Risk at Home - 01/17/23 1318     Any stairs in or around the home? No    Home free of loose throw rugs in walkways, pet beds, electrical cords, etc? Yes    Adequate lighting in your home to reduce risk of falls? Yes    Life alert? No    Use of a cane, walker or w/c? Yes   cane   Grab bars in the bathroom? Yes    Shower chair or bench in shower? Yes    Elevated toilet seat or a handicapped toilet? Yes             TIMED UP AND GO:  Was the test performed? No    Cognitive Function:        01/17/2023    1:18 PM  6CIT Screen  What Year? 0 points  What month? 0 points    Immunizations Immunization History  Administered Date(s) Administered   Influenza Split 03/20/2015, 02/22/2017, 03/03/2020   Influenza Whole 03/14/2007, 02/21/2008   Influenza, High Dose Seasonal PF 02/17/2014, 03/23/2016, 03/13/2018, 01/30/2019, 03/20/2019, 03/02/2020   Influenza,inj,Quad PF,6+ Mos 02/17/2014  Influenza-Unspecified 03/26/2012, 03/04/2013, 02/21/2017, 04/03/2018, 03/06/2021   PFIZER Comirnaty(Gray Top)Covid-19 Tri-Sucrose Vaccine 06/26/2019, 07/17/2019, 03/13/2020    PFIZER(Purple Top)SARS-COV-2 Vaccination 06/26/2019, 07/27/2019, 05/13/2020   Pneumococcal Conjugate-13 03/28/2006, 02/17/2015, 06/15/2016   Pneumococcal Polysaccharide-23 03/28/2006   Tdap 07/24/2010, 06/21/2021   Zoster Recombinant(Shingrix) 01/26/2018, 04/13/2018   Zoster, Live 03/18/2011, 05/16/2018, 07/01/2020    TDAP status: Up to date  Flu Vaccine status: Up to date  Pneumococcal vaccine status: Up to date  Covid-19 vaccine status: Completed vaccines  Qualifies for Shingles Vaccine? Yes   Zostavax completed Yes   Shingrix Completed?: Yes  Screening Tests Health Maintenance  Topic Date Due   Pneumonia Vaccine 58+ Years old (3 of 3 - PPSV23 or PCV20) 06/15/2017   COVID-19 Vaccine (7 - 2023-24 season) 02/04/2022   INFLUENZA VACCINE  01/05/2023   Medicare Annual Wellness (AWV)  01/17/2024   DTaP/Tdap/Td (3 - Td or Tdap) 06/22/2031   DEXA SCAN  Completed   Zoster Vaccines- Shingrix  Completed   HPV VACCINES  Aged Out    Health Maintenance  Health Maintenance Due  Topic Date Due   Pneumonia Vaccine 14+ Years old (3 of 3 - PPSV23 or PCV20) 06/15/2017   COVID-19 Vaccine (7 - 2023-24 season) 02/04/2022   INFLUENZA VACCINE  01/05/2023    Colorectal cancer screening: No longer required.   Mammogram status: Completed 07/27/2022. Repeat every year  Bone Density status: Completed 12/15/2000. Results reflect: Bone density results: OSTEOPOROSIS. Repeat every 2 years.  Lung Cancer Screening: (Low Dose CT Chest recommended if Age 66-80 years, 20 pack-year currently smoking OR have quit w/in 15years.) does not qualify.   Lung Cancer Screening Referral: N/A  Additional Screening:  Hepatitis C Screening: does not qualify;  Vision Screening: Recommended annual ophthalmology exams for early detection of glaucoma and other disorders of the eye. Is the patient up to date with their annual eye exam?  Yes  Who is the provider or what is the name of the office in which the  patient attends annual eye exams? Dr. Lottie Dawson If pt is not established with a provider, would they like to be referred to a provider to establish care? No .   Dental Screening: Recommended annual dental exams for proper oral hygiene  Community Resource Referral / Chronic Care Management: CRR required this visit?  No   CCM required this visit?  No     Plan:     I have personally reviewed and noted the following in the patient's chart:   Medical and social history Use of alcohol, tobacco or illicit drugs  Current medications and supplements including opioid prescriptions. Patient is not currently taking opioid prescriptions. Functional ability and status Nutritional status Physical activity Advanced directives List of other physicians Hospitalizations, surgeries, and ER visits in previous 12 months Vitals Screenings to include cognitive, depression, and falls Referrals and appointments  In addition, I have reviewed and discussed with patient certain preventive protocols, quality metrics, and best practice recommendations. A written personalized care plan for preventive services as well as general preventive health recommendations were provided to patient.      L , CMA   01/17/2023   After Visit Summary: (Mail) Due to this being a telephonic visit, the after visit summary with patients personalized plan was offered to patient via mail   Nurse Notes: Patient is concerned that her Hemoglobin is low due to fatigue.  She stated that she received a DEXA last year with Dr. Talmage Nap, so declined the referral today.  I did  not see DEXA from last year in her chart.  She is due for her Flu and Covid vaccines. Patient did not answer all question due to her husband passing away at the end of June.  Patient was very short with me today.

## 2023-01-17 NOTE — Patient Instructions (Signed)
Kim [name] , Thank you for taking time to come for your Medicare Wellness Visit. I appreciate your ongoing commitment to your health goals. Please review the following plan we discussed and let me know if I can assist you in the future.   Referrals/Orders/Follow-Ups/Clinician Recommendations: Remember to get your Flu and Covid vaccines.  You can get these at your local pharmacy.  Each day, aim for 6 glasses of water, plenty of protein in your diet and try to get up and walk/ stretch every hour for 5-10 minutes at a time.    This is a list of the screening recommended for you and due dates:  Health Maintenance  Topic Date Due   Pneumonia Vaccine (3 of 3 - PPSV23 or PCV20) 06/15/2017   COVID-19 Vaccine (7 - 2023-24 season) 02/04/2022   Flu Shot  01/05/2023   Medicare Annual Wellness Visit  01/17/2024   DTaP/Tdap/Td vaccine (3 - Td or Tdap) 06/22/2031   DEXA scan (bone density measurement)  Completed   Zoster (Shingles) Vaccine  Completed   HPV Vaccine  Aged Out    Advanced directives: (Copy Requested) Please bring a copy of your health care power of attorney and living will to the office to be added to your chart at your convenience.  Next Medicare Annual Wellness Visit scheduled for next year: Yes  Preventive Care 81 Years and Older, Female Preventive care refers to lifestyle choices and visits with your health care provider that can promote health and wellness. What does preventive care include? A yearly physical exam. This is also called an annual well check. Dental exams once or twice a year. Routine eye exams. Ask your health care provider how often you should have your eyes checked. Personal lifestyle choices, including: Daily care of your teeth and gums. Regular physical activity. Eating a healthy diet. Avoiding tobacco and drug use. Limiting alcohol use. Practicing safe sex. Taking low-dose aspirin every day. Taking vitamin and mineral supplements as recommended by your health  care provider. What happens during an annual well check? The services and screenings done by your health care provider during your annual well check will depend on your age, overall health, lifestyle risk factors, and family history of disease. Counseling  Your health care provider may ask you questions about your: Alcohol use. Tobacco use. Drug use. Emotional well-being. Home and relationship well-being. Sexual activity. Eating habits. History of falls. Memory and ability to understand (cognition). Work and work Astronomer. Reproductive health. Screening  You may have the following tests or measurements: Height, weight, and BMI. Blood pressure. Lipid and cholesterol levels. These may be checked every 5 years, or more frequently if you are over 81 years old. Skin check. Lung cancer screening. You may have this screening every year starting at age 81 if you have a 30-pack-year history of smoking and currently smoke or have quit within the past 15 years. Fecal occult blood test (FOBT) of the stool. You may have this test every year starting at age 81. Flexible sigmoidoscopy or colonoscopy. You may have a sigmoidoscopy every 5 years or a colonoscopy every 10 years starting at age 81. Hepatitis C blood test. Hepatitis B blood test. Sexually transmitted disease (STD) testing. Diabetes screening. This is done by checking your blood sugar (glucose) after you have not eaten for a while (fasting). You may have this done every 1-3 years. Bone density scan. This is done to screen for osteoporosis. You may have this done starting at age 81. Mammogram. This may  be done every 1-2 years. Talk to your health care provider about how often you should have regular mammograms. Talk with your health care provider about your test results, treatment options, and if necessary, the need for more tests. Vaccines  Your health care provider may recommend certain vaccines, such as: Influenza vaccine. This is  recommended every year. Tetanus, diphtheria, and acellular pertussis (Tdap, Td) vaccine. You may need a Td booster every 10 years. Zoster vaccine. You may need this after age 81. Pneumococcal 13-valent conjugate (PCV13) vaccine. One dose is recommended after age 81. Pneumococcal polysaccharide (PPSV23) vaccine. One dose is recommended after age 81. Talk to your health care provider about which screenings and vaccines you need and how often you need them. This information is not intended to replace advice given to you by your health care provider. Make sure you discuss any questions you have with your health care provider. Document Released: 06/19/2015 Document Revised: 02/10/2016 Document Reviewed: 03/24/2015 Elsevier Interactive Patient Education  2017 [employer].  Fall Prevention in the Home Falls can cause injuries. They can happen to people of all ages. There are many things you can do to make your home safe and to help prevent falls. What can I do on the outside of my home? Regularly fix the edges of walkways and driveways and fix any cracks. Remove anything that might make you trip as you walk through a door, such as a raised step or threshold. Trim any bushes or trees on the path to your home. Use bright outdoor lighting. Clear any walking paths of anything that might make someone trip, such as rocks or tools. Regularly check to see if handrails are loose or broken. Make sure that both sides of any steps have handrails. Any raised decks and porches should have guardrails on the edges. Have any leaves, snow, or ice cleared regularly. Use sand or salt on walking paths during winter. Clean up any spills in your garage right away. This includes oil or grease spills. What can I do in the bathroom? Use night lights. Install grab bars by the toilet and in the tub and shower. Do not use towel bars as grab bars. Use non-skid mats or decals in the tub or shower. If you need to sit down in  the shower, use a plastic, non-slip stool. Keep the floor dry. Clean up any water that spills on the floor as soon as it happens. Remove soap buildup in the tub or shower regularly. Attach bath mats securely with double-sided non-slip rug tape. Do not have throw rugs and other things on the floor that can make you trip. What can I do in the bedroom? Use night lights. Make sure that you have a light by your bed that is easy to reach. Do not use any sheets or blankets that are too big for your bed. They should not hang down onto the floor. Have a firm chair that has side arms. You can use this for support while you get dressed. Do not have throw rugs and other things on the floor that can make you trip. What can I do in the kitchen? Clean up any spills right away. Avoid walking on wet floors. Keep items that you use a lot in easy-to-reach places. If you need to reach something above you, use a strong step stool that has a grab bar. Keep electrical cords out of the way. Do not use floor polish or wax that makes floors slippery. If you must use  wax, use non-skid floor wax. Do not have throw rugs and other things on the floor that can make you trip. What can I do with my stairs? Do not leave any items on the stairs. Make sure that there are handrails on both sides of the stairs and use them. Fix handrails that are broken or loose. Make sure that handrails are as long as the stairways. Check any carpeting to make sure that it is firmly attached to the stairs. Fix any carpet that is loose or worn. Avoid having throw rugs at the top or bottom of the stairs. If you do have throw rugs, attach them to the floor with carpet tape. Make sure that you have a light switch at the top of the stairs and the bottom of the stairs. If you do not have them, ask someone to add them for you. What else can I do to help prevent falls? Wear shoes that: Do not have high heels. Have rubber bottoms. Are comfortable  and fit you well. Are closed at the toe. Do not wear sandals. If you use a stepladder: Make sure that it is fully opened. Do not climb a closed stepladder. Make sure that both sides of the stepladder are locked into place. Ask someone to hold it for you, if possible. Clearly mark and make sure that you can see: Any grab bars or handrails. First and last steps. Where the edge of each step is. Use tools that help you move around (mobility aids) if they are needed. These include: Canes. Walkers. Scooters. Crutches. Turn on the lights when you go into a dark area. Replace any light bulbs as soon as they burn out. Set up your furniture so you have a clear path. Avoid moving your furniture around. If any of your floors are uneven, fix them. If there are any pets around you, be aware of where they are. Review your medicines with your doctor. Some medicines can make you feel dizzy. This can increase your chance of falling. Ask your doctor what other things that you can do to help prevent falls. This information is not intended to replace advice given to you by your health care provider. Make sure you discuss any questions you have with your health care provider. Document Released: 03/19/2009 Document Revised: 10/29/2015 Document Reviewed: 06/27/2014 Elsevier Interactive Patient Education  2017 [employer].

## 2023-01-19 DIAGNOSIS — D044 Carcinoma in situ of skin of scalp and neck: Secondary | ICD-10-CM | POA: Diagnosis not present

## 2023-01-22 ENCOUNTER — Encounter: Payer: Self-pay | Admitting: Internal Medicine

## 2023-01-22 NOTE — Patient Instructions (Addendum)
Blood work was ordered.   The lab is on the first floor.    Medications changes include :   none    A referral was ordered and someone will call you to schedule an appointment.     Return in about 1 year (around 01/23/2024) for Physical Exam.    Health Maintenance, Female Adopting a healthy lifestyle and getting preventive care are important in promoting health and wellness. Ask your health care provider about: The right schedule for you to have regular tests and exams. Things you can do on your own to prevent diseases and keep yourself healthy. What should I know about diet, weight, and exercise? Eat a healthy diet  Eat a diet that includes plenty of vegetables, fruits, low-fat dairy products, and lean protein. Do not eat a lot of foods that are high in solid fats, added sugars, or sodium. Maintain a healthy weight Body mass index (BMI) is used to identify weight problems. It estimates body fat based on height and weight. Your health care provider can help determine your BMI and help you achieve or maintain a healthy weight. Get regular exercise Get regular exercise. This is one of the most important things you can do for your health. Most adults should: Exercise for at least 150 minutes each week. The exercise should increase your heart rate and make you sweat (moderate-intensity exercise). Do strengthening exercises at least twice a week. This is in addition to the moderate-intensity exercise. Spend less time sitting. Even light physical activity can be beneficial. Watch cholesterol and blood lipids Have your blood tested for lipids and cholesterol at 81 years of age, then have this test every 5 years. Have your cholesterol levels checked more often if: Your lipid or cholesterol levels are high. You are older than 81 years of age. You are at high risk for heart disease. What should I know about cancer screening? Depending on your health history and family history,  you may need to have cancer screening at various ages. This may include screening for: Breast cancer. Cervical cancer. Colorectal cancer. Skin cancer. Lung cancer. What should I know about heart disease, diabetes, and high blood pressure? Blood pressure and heart disease High blood pressure causes heart disease and increases the risk of stroke. This is more likely to develop in people who have high blood pressure readings or are overweight. Have your blood pressure checked: Every 3-5 years if you are 59-90 years of age. Every year if you are 11 years old or older. Diabetes Have regular diabetes screenings. This checks your fasting blood sugar level. Have the screening done: Once every three years after age 83 if you are at a normal weight and have a low risk for diabetes. More often and at a younger age if you are overweight or have a high risk for diabetes. What should I know about preventing infection? Hepatitis B If you have a higher risk for hepatitis B, you should be screened for this virus. Talk with your health care provider to find out if you are at risk for hepatitis B infection. Hepatitis C Testing is recommended for: Everyone born from [birth year] through 1964. Anyone with known risk factors for hepatitis C. Sexually transmitted infections (STIs) Get screened for STIs, including gonorrhea and chlamydia, if: You are sexually active and are younger than 81 years of age. You are older than 81 years of age and your health care provider tells you that you are at risk for this  type of infection. Your sexual activity has changed since you were last screened, and you are at increased risk for chlamydia or gonorrhea. Ask your health care provider if you are at risk. Ask your health care provider about whether you are at high risk for HIV. Your health care provider may recommend a prescription medicine to help prevent HIV infection. If you choose to take medicine to prevent HIV, you should  first get tested for HIV. You should then be tested every 3 months for as long as you are taking the medicine. Pregnancy If you are about to stop having your period (premenopausal) and you may become pregnant, seek counseling before you get pregnant. Take 400 to 800 micrograms (mcg) of folic acid every day if you become pregnant. Ask for birth control (contraception) if you want to prevent pregnancy. Osteoporosis and menopause Osteoporosis is a disease in which the bones lose minerals and strength with aging. This can result in bone fractures. If you are 96 years old or older, or if you are at risk for osteoporosis and fractures, ask your health care provider if you should: Be screened for bone loss. Take a calcium or vitamin D supplement to lower your risk of fractures. Be given hormone replacement therapy (HRT) to treat symptoms of menopause. Follow these instructions at home: Alcohol use Do not drink alcohol if: Your health care provider tells you not to drink. You are pregnant, may be pregnant, or are planning to become pregnant. If you drink alcohol: Limit how much you have to: 0-1 drink a day. Know how much alcohol is in your drink. In the [place] one drink equals one 12 oz bottle of beer (355 mL), one 5 oz glass of wine (148 mL), or one 1 oz glass of hard liquor (44 mL). Lifestyle Do not use any products that contain nicotine or tobacco. These products include cigarettes, chewing tobacco, and vaping devices, such as e-cigarettes. If you need help quitting, ask your health care provider. Do not use street drugs. Do not share needles. Ask your health care provider for help if you need support or information about quitting drugs. General instructions Schedule regular health, dental, and eye exams. Stay current with your vaccines. Tell your health care provider if: You often feel depressed. You have ever been abused or do not feel safe at home. Summary Adopting a healthy lifestyle  and getting preventive care are important in promoting health and wellness. Follow your health care provider's instructions about healthy diet, exercising, and getting tested or screened for diseases. Follow your health care provider's instructions on monitoring your cholesterol and blood pressure. This information is not intended to replace advice given to you by your health care provider. Make sure you discuss any questions you have with your health care provider. Document Revised: 10/12/2020 Document Reviewed: 10/12/2020 Elsevier Patient Education  2024 [employer].

## 2023-01-22 NOTE — Progress Notes (Unsigned)
Subjective:    Patient ID: Kim [name], female    DOB: 09/21/1940, 82 y.o.   MRN: [id number]      HPI [facility] is here for a Physical exam and her chronic medical problems.    Her husband passed away the end of 2022/12/03.  It has been difficult but overall she is doing ok.   She does feel like her hgb is low and she may need a transfusion.    Medications and allergies reviewed with patient and updated if appropriate.  Current Outpatient Medications on File Prior to Visit  Medication Sig Dispense Refill   Abaloparatide (TYMLOS) 3120 MCG/1.56ML SOPN Inject 1.56 mLs into the skin at bedtime.     acetaminophen (TYLENOL) 500 MG tablet Take 1,000 mg by mouth every morning.     B-D 3CC LUER-LOK SYR 22GX1-1/2 22G X 1-1/2" 3 ML MISC USE AS DIRECTED 3 each PRN   B-D 3CC LUER-LOK SYR 25GX5/8" 25G X 5/8" 3 ML MISC USE AS DIRECTED. 3 each PRN   brimonidine (ALPHAGAN) 0.2 % ophthalmic solution Place 1 drop into both eyes 3 (three) times daily.     Calcium Citrate-Vitamin D 200-125 MG-UNIT TABS Take 1-2 tablets by mouth See admin instructions. Take 2 tab in the morning and 1 tab at bedtime     Cholecalciferol (VITAMIN D3) 50 MCG (2000 UT) TABS Take 2,000 Units by mouth daily.     cyanocobalamin (VITAMIN B12) 1000 MCG/ML injection INJECT 1 ML IM ONCE A MONTH. (Patient taking differently: Inject 1,000 mcg into the skin every 30 (thirty) days.) 3 mL PRN   dorzolamide-timolol (COSOPT) 2-0.5 % ophthalmic solution Place 1 drop into both eyes 2 (two) times daily.     Fluocinolone Acetonide Body 0.01 % OIL Apply topically.     gabapentin (NEURONTIN) 300 MG capsule Take 600 mg by mouth at bedtime.     loratadine (CLARITIN) 10 MG tablet Take 10 mg by mouth daily. [place]     Melatonin 10 MG TBCR Take 10 mg by mouth at bedtime.     Multiple Vitamins-Minerals (ONE-A-DAY WOMENS 50+ ADVANTAGE) TABS Take 1 tablet by mouth daily.     ROCKLATAN 0.02-0.005 % SOLN Place 1 drop into both eyes daily.      Teriparatide 600 MCG/2.4ML SOPN Subcutaneous once a day for 30 days     triamcinolone cream (KENALOG) 0.1 % Apply 1 application  topically every other day.     [DISCONTINUED] potassium chloride 40 MEQ/15ML (20%) LIQD Take 15 mLs (40 mEq total) by mouth as directed. 150 mL 0   No current facility-administered medications on file prior to visit.    Review of Systems  Constitutional:  Positive for appetite change (decreased). Negative for fever.  Eyes:  Negative for visual disturbance.  Respiratory:  Negative for cough, shortness of breath and wheezing.   Cardiovascular:  Negative for chest pain, palpitations and leg swelling.  Gastrointestinal:  Negative for abdominal pain.       Occ reflux  Genitourinary:  Negative for dysuria.  Musculoskeletal:  Negative for arthralgias and back pain.  Skin:  Negative for rash.  Neurological:  Negative for light-headedness and headaches.  Psychiatric/Behavioral:  Positive for dysphoric mood (grieving). Negative for sleep disturbance.        Objective:   Vitals:   01/23/23 1429  BP: 126/64  Pulse: 75  Temp: 98.8 F (37.1 C)  SpO2: 98%   Filed Weights   01/23/23 1429  Weight: 77 lb (34.9 kg)  Body mass index is 17.26 kg/m.  BP Readings from Last 3 Encounters:  01/23/23 126/64  12/26/22 (!) 138/52  12/15/22 116/68    Wt Readings from Last 3 Encounters:  01/23/23 77 lb (34.9 kg)  01/17/23 80 lb (36.3 kg)  12/26/22 80 lb (36.3 kg)       Physical Exam Constitutional: She appears well-developed and well-nourished. No distress.  HENT:  Head: Normocephalic and atraumatic.  Right Ear: External ear normal. Normal ear canal and TM Left Ear: External ear normal.  Normal ear canal and TM Mouth/Throat: Oropharynx is clear and moist.  Eyes: Conjunctivae normal.  Neck: Neck supple. No tracheal deviation present. No thyromegaly present.  No carotid bruit  Cardiovascular: Normal rate, regular rhythm and normal heart sounds.   No  murmur heard.  No edema. Pulmonary/Chest: Effort normal and breath sounds normal. No respiratory distress. She has no wheezes. She has no rales.  Breast: deferred   Abdominal: Soft. She exhibits no distension. There is no tenderness.  Lymphadenopathy: She has no cervical adenopathy.  Skin: Skin is warm and dry. She is not diaphoretic.  Psychiatric: She has a normal mood and affect. Her behavior is normal.     Lab Results  Component Value Date   WBC 6.0 12/05/2022   HGB 9.9 (L) 12/05/2022   HCT 31.1 (L) 12/05/2022   PLT 117 (L) 12/05/2022   GLUCOSE 74 12/05/2022   CHOL 127 08/22/2011   TRIG 138 08/22/2011   HDL 35 (L) 08/22/2011   LDLCALC 64 08/22/2011   ALT 28 12/05/2022   AST 27 12/05/2022   NA 140 12/05/2022   K 4.2 12/05/2022   CL 108 12/05/2022   CREATININE 1.66 (H) 12/05/2022   BUN 34 (H) 12/05/2022   CO2 26 12/05/2022   TSH 4.162 08/22/2011   INR 1.09 03/05/2016         Assessment & Plan:   Physical exam: Screening blood work  ordered Exercise  regular  - goes to  gym Weight  too low- working on -- trying to gain weight Substance abuse  none   Reviewed recommended immunizations.   Health Maintenance  Topic Date Due   Pneumonia Vaccine 70+ Years old (3 of 3 - PPSV23 or PCV20) 06/15/2017   COVID-19 Vaccine (7 - 2023-24 season) 02/04/2022   INFLUENZA VACCINE  09/04/2023 (Originally 01/05/2023)   Medicare Annual Wellness (AWV)  01/17/2024   DTaP/Tdap/Td (3 - Td or Tdap) 06/22/2031   DEXA SCAN  Completed   Zoster Vaccines- Shingrix  Completed   HPV VACCINES  Aged Out          See Problem List for Assessment and Plan of chronic medical problems.

## 2023-01-23 ENCOUNTER — Ambulatory Visit (INDEPENDENT_AMBULATORY_CARE_PROVIDER_SITE_OTHER): Payer: Medicare PPO | Admitting: Internal Medicine

## 2023-01-23 VITALS — BP 126/64 | HR 75 | Temp 98.8°F | Ht <= 58 in | Wt 77.0 lb

## 2023-01-23 DIAGNOSIS — D638 Anemia in other chronic diseases classified elsewhere: Secondary | ICD-10-CM

## 2023-01-23 DIAGNOSIS — M810 Age-related osteoporosis without current pathological fracture: Secondary | ICD-10-CM | POA: Diagnosis not present

## 2023-01-23 DIAGNOSIS — Z0000 Encounter for general adult medical examination without abnormal findings: Secondary | ICD-10-CM

## 2023-01-23 DIAGNOSIS — N1832 Chronic kidney disease, stage 3b: Secondary | ICD-10-CM

## 2023-01-23 DIAGNOSIS — R634 Abnormal weight loss: Secondary | ICD-10-CM | POA: Diagnosis not present

## 2023-01-23 DIAGNOSIS — E43 Unspecified severe protein-calorie malnutrition: Secondary | ICD-10-CM | POA: Diagnosis not present

## 2023-01-23 DIAGNOSIS — K50813 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: Secondary | ICD-10-CM | POA: Diagnosis not present

## 2023-01-23 DIAGNOSIS — D696 Thrombocytopenia, unspecified: Secondary | ICD-10-CM | POA: Diagnosis not present

## 2023-01-23 LAB — CBC WITH DIFFERENTIAL/PLATELET
Basophils Absolute: 0 10*3/uL (ref 0.0–0.1)
Basophils Relative: 0.4 % (ref 0.0–3.0)
Eosinophils Absolute: 0.1 10*3/uL (ref 0.0–0.7)
Eosinophils Relative: 1.5 % (ref 0.0–5.0)
HCT: 31.9 % — ABNORMAL LOW (ref 36.0–46.0)
Hemoglobin: 10.3 g/dL — ABNORMAL LOW (ref 12.0–15.0)
Lymphocytes Relative: 14.5 % (ref 12.0–46.0)
Lymphs Abs: 0.9 10*3/uL (ref 0.7–4.0)
MCHC: 32.4 g/dL (ref 30.0–36.0)
MCV: 97.1 fl (ref 78.0–100.0)
Monocytes Absolute: 0.6 10*3/uL (ref 0.1–1.0)
Monocytes Relative: 9 % (ref 3.0–12.0)
Neutro Abs: 4.7 10*3/uL (ref 1.4–7.7)
Neutrophils Relative %: 74.6 % (ref 43.0–77.0)
Platelets: 162 10*3/uL (ref 150.0–400.0)
RBC: 3.29 Mil/uL — ABNORMAL LOW (ref 3.87–5.11)
RDW: 14.2 % (ref 11.5–15.5)
WBC: 6.4 10*3/uL (ref 4.0–10.5)

## 2023-01-23 LAB — TSH: TSH: 1.55 u[IU]/mL (ref 0.35–5.50)

## 2023-01-23 LAB — FERRITIN: Ferritin: 892.5 ng/mL — ABNORMAL HIGH (ref 10.0–291.0)

## 2023-01-23 NOTE — Progress Notes (Addendum)
Subjective:   Kim [name] is a 82 y.o. female who presents for an Initial Medicare Annual Wellness Visit.  Visit Complete: Virtual  I connected with  Kim [name] on 01/17/2023 by a audio enabled telemedicine application and verified that I am speaking with the correct person using two identifiers.  Patient Location: Home  Provider Location: Home Office  I discussed the limitations of evaluation and management by telemedicine. The patient expressed understanding and agreed to proceed.  Vital Signs: Vital signs are patient reported.   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     01/17/2023    1:17 PM 12/26/2022    7:55 AM 09/17/2020   11:18 AM 01/17/2018   11:46 AM 01/17/2018   11:33 AM 12/05/2016   11:28 AM 03/05/2016   11:36 AM  Advanced Directives  Does Patient Have a Medical Advance Directive? Yes Yes -- No No No No  Type of Estate agent of [place];Living will Healthcare Power of [place];Living will       Copy of Healthcare Power of Attorney in Chart? No - copy requested No - copy requested       Would patient like information on creating a medical advance directive?    No - Patient declined No - Patient declined No - Patient declined     Current Medications (verified) Outpatient Encounter Medications as of 01/17/2023  Medication Sig   Abaloparatide (TYMLOS) 3120 MCG/1.56ML SOPN Inject 1.56 mLs into the skin at bedtime.   acetaminophen (TYLENOL) 500 MG tablet Take 1,000 mg by mouth every morning.   B-D 3CC LUER-LOK SYR 22GX1-1/2 22G X 1-1/2" 3 ML MISC USE AS DIRECTED   B-D 3CC LUER-LOK SYR 25GX5/8" 25G X 5/8" 3 ML MISC USE AS DIRECTED.   brimonidine  (ALPHAGAN) 0.2 % ophthalmic solution Place 1 drop into both eyes 3 (three) times daily.   Calcium Citrate-Vitamin D 200-125 MG-UNIT TABS Take 1-2 tablets by mouth See admin instructions. Take 2 tab in the morning and 1 tab at bedtime   Cholecalciferol (VITAMIN D3) 50 MCG (2000 UT) TABS Take 2,000 Units by mouth daily.   cyanocobalamin (VITAMIN B12) 1000 MCG/ML injection INJECT 1 ML IM ONCE A MONTH. (Patient taking differently: Inject 1,000 mcg into the skin every 30 (thirty) days.)   dorzolamide-timolol (COSOPT) 2-0.5 % ophthalmic solution Place 1 drop into both eyes 2 (two) times daily.   gabapentin (NEURONTIN) 300 MG capsule Take 600 mg by mouth at bedtime.   loratadine (CLARITIN) 10 MG tablet Take 10 mg by mouth daily. [place]   Melatonin 10 MG TBCR Take 10 mg by mouth at bedtime.   Multiple Vitamins-Minerals (ONE-A-DAY WOMENS 50+ ADVANTAGE) TABS Take 1 tablet by mouth daily.   ROCKLATAN 0.02-0.005 % SOLN Place 1 drop into both eyes daily.   triamcinolone cream (KENALOG) 0.1 % Apply 1 application  topically every other day.   [DISCONTINUED] potassium chloride 40 MEQ/15ML (20%) LIQD Take 15 mLs (40 mEq total) by mouth as directed.   No facility-administered encounter medications on file as of 01/17/2023.    Allergies (verified) Antihistamines, chlorpheniramine-type; Chlorpheniramine; Hydrocodone-acetaminophen; Metronidazole; Other; Sulfa antibiotics; Tape; Wound dressing adhesive; and Chlorhexidine  History: Past Medical History:  Diagnosis Date   Anemia    related to malabsorption--gets B12 shots and iron infusions, managed by Dr. Arbutus Ped   [facility] (basal cell carcinoma of skin)    Crohn disease (HCC)    Dr. Haze Boyden at [facility]   Glaucoma    Dr. Eulah Pont   Intestinal malabsorption    OA (osteoarthritis)    hands,back   Osteoporosis    managed by Dr. Margo Aye at [place], [place] q2 yrs   Past Surgical History:  Procedure Laterality Date   BREAST EXCISIONAL BIOPSY Right    BRONCHIAL BIOPSY   12/26/2022   Procedure: BRONCHIAL BIOPSIES;  Surgeon: Leslye Peer, MD;  Location: [facility] ENDOSCOPY;  Service: Pulmonary;;   BRONCHIAL BRUSHINGS  12/26/2022   Procedure: BRONCHIAL BRUSHINGS;  Surgeon: Leslye Peer, MD;  Location: [facility] ENDOSCOPY;  Service: Pulmonary;;   BRONCHIAL NEEDLE ASPIRATION BIOPSY  12/26/2022   Procedure: BRONCHIAL NEEDLE ASPIRATION BIOPSIES;  Surgeon: Leslye Peer, MD;  Location: [facility] ENDOSCOPY;  Service: Pulmonary;;   BRONCHIAL WASHINGS  12/26/2022   Procedure: BRONCHIAL WASHINGS;  Surgeon: Leslye Peer, MD;  Location: [facility] ENDOSCOPY;  Service: Pulmonary;;   Crohn  2009   internal ostomy pouch  1994   INTRAMEDULLARY (IM) NAIL INTERTROCHANTERIC Left 03/05/2016   Procedure: INTRAMEDULLARY (IM) NAIL INTERTROCHANTRIC;  Surgeon: Yolonda Kida, MD;  Location: [facility] OR;  Service: Orthopedics;  Laterality: Left;   REDUCTION MAMMAPLASTY Bilateral 01/2017   TOTAL COLECTOMY  1984   due to Crohn's   Family History  Problem Relation Age of Onset   Diabetes Mother        borderline   Heart disease Mother        MI   Heart disease Father    Thyroid disease Sister    Social History   Socioeconomic History   Marital status: Widowed    Spouse name: Not on file   Number of children: 2   Years of education: Not on file   Highest education level: Not on file  Occupational History   Occupation: attorney, 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  Vaping Use   Vaping status: Never Used  Substance and Sexual Activity   Alcohol use: No   Drug use: No   Sexual activity: Not Currently  Other Topics Concern   Not on file  Social History Narrative   Husband passed away end of 2022/12/21, no pets.  Children in [place] and [place].  Husband has Parkinsons Disease    Social Determinants of Health   Financial Resource Strain: Low Risk  (01/17/2023)   Overall Financial Resource Strain (CARDIA)    Difficulty of Paying Living Expenses: Not very hard  Food Insecurity: No Food Insecurity (01/17/2023)   Hunger Vital Sign    Worried About Running Out of Food in the Last Year: Never true    Ran Out of Food in the Last Year: Never true  Transportation Needs: No Transportation Needs (01/17/2023)   PRAPARE - Administrator, Civil Service (Medical): No    Lack of Transportation (Non-Medical): No  Physical Activity: Sufficiently Active (01/17/2023)   Exercise Vital Sign    Days of Exercise per Week: 6 days  Minutes of Exercise per Session: 40 min  Stress: Stress Concern Present (01/17/2023)   [employer] of Occupational Health - Occupational Stress Questionnaire    Feeling of Stress : Very much  Social Connections: Moderately Isolated (01/17/2023)   Social Connection and Isolation Panel [NHANES]    Frequency of Communication with Friends and Family: More than three times a week    Frequency of Social Gatherings with Friends and Family: Twice a week    Attends Religious Services: Never    Database administrator or Organizations: Yes    Attends Banker Meetings: 1 to 4 times per year    Marital Status: Widowed    Tobacco Counseling Counseling given: Not Answered Tobacco comments: Pt declines to answer this question.   Clinical Intake:  Pre-visit preparation completed: Yes        BMI - recorded: 17.94 Nutritional Status: BMI <19  Underweight Nutritional Risks: None Diabetes: No  How often do you need to have someone help you when you read instructions, pamphlets, or other written materials from your doctor or pharmacy?: 3 - Sometimes (Has Glaucoma)  Interpreter Needed?: No  Information entered by :: Coleman Kalas, RMA   Activities of Daily Living    01/17/2023    1:10 PM  In your present state of health, do  you have any difficulty performing the following activities:  Hearing? 0  Vision? 1  Difficulty concentrating or making decisions? 0  Walking or climbing stairs? 1  Comment sometimes, tries to aviod stairs  Dressing or bathing? 0  Doing errands, shopping? 1  Comment uses Wellsite geologist and eating ? N  Using the Toilet? N  In the past six months, have you accidently leaked urine? N  Comment Ileostomy  Do you have problems with loss of bowel control? N  Managing your Medications? N  Managing your Finances? N  Housekeeping or managing your Housekeeping? N    Patient Care Team: Pincus Sanes, MD as PCP - General (Internal Medicine) Si Gaul, MD (Hematology) Riccardo Dubin, MD (Gastroenterology) Isaac Bliss, MD (Dermatology) Janet Berlin, MD (Ophthalmology) Lottie Dawson, Doran Stabler, MD as Referring Physician (Ophthalmology) Dorisann Frames, MD as Referring Physician (Endocrinology)  Indicate any recent Medical Services you may have received from other than [facility] providers in the past year (date may be approximate).     Assessment:   This is a routine wellness examination for Kim [name].  Hearing/Vision screen Hearing Screening - Comments:: Denies hearing difficulties   Vision Screening - Comments:: Has glaucoma  Dietary issues and exercise activities discussed:     Goals Addressed   None   Depression Screen    01/17/2023    1:23 PM 03/18/2013   11:01 AM  PHQ 2/9 Scores  PHQ - 2 Score  0  Exception Documentation Patient refusal     Fall Risk    01/17/2023    1:18 PM 06/21/2021    2:15 PM 05/19/2014   11:00 AM 03/17/2014   11:07 AM 03/18/2013   11:01 AM  Fall Risk   Falls in the past year? 0 0 No No No  Number falls in past yr: 0 0     Injury with Fall? 0 0     Risk for fall due to : No Fall Risks No Fall Risks     Follow up Falls prevention discussed;Falls evaluation completed Falls evaluation completed       MEDICARE RISK AT  HOME:    TIMED UP AND GO:  Was the test performed? No    Cognitive Function:        01/17/2023    1:18 PM  6CIT Screen  What Year? 0 points  What month? 0 points  What time? 0 points  Count back from 20 0 points  Months in reverse 0 points  Repeat phrase 0 points  Total Score 0 points    Immunizations Immunization History  Administered Date(s) Administered   Influenza Split 03/20/2015, 02/22/2017, 03/03/2020   Influenza Whole 03/14/2007, 02/21/2008   Influenza, High Dose Seasonal PF 02/17/2014, 03/23/2016, 03/13/2018, 01/30/2019, 03/20/2019, 03/02/2020   Influenza,inj,Quad PF,6+ Mos 02/17/2014   Influenza-Unspecified 03/26/2012, 03/04/2013, 02/21/2017, 04/03/2018, 03/06/2021   PFIZER Comirnaty(Gray Top)Covid-19 Tri-Sucrose Vaccine 06/26/2019, 07/17/2019, 03/13/2020   PFIZER(Purple Top)SARS-COV-2 Vaccination 06/26/2019, 07/27/2019, 05/13/2020   Pneumococcal Conjugate-13 03/28/2006, 02/17/2015, 06/15/2016   Pneumococcal Polysaccharide-23 03/28/2006   Tdap 07/24/2010, 06/21/2021   Zoster Recombinant(Shingrix) 01/26/2018, 04/13/2018   Zoster, Live 03/18/2011, 05/16/2018, 07/01/2020    TDAP status: Up to date  Flu Vaccine status: Up to date  Pneumococcal vaccine status: Up to date  Covid-19 vaccine status: Completed vaccines  Qualifies for Shingles Vaccine? Yes   Zostavax completed Yes   Shingrix Completed?: Yes  Screening Tests Health Maintenance  Topic Date Due   Pneumonia Vaccine 31+ Years old (3 of 3 - PPSV23 or PCV20) 06/15/2017   COVID-19 Vaccine (7 - 2023-24 season) 02/04/2022   INFLUENZA VACCINE  01/05/2023   Medicare Annual Wellness (AWV)  01/17/2024   DTaP/Tdap/Td (3 - Td or Tdap) 06/22/2031   DEXA SCAN  Completed   Zoster Vaccines- Shingrix  Completed   HPV VACCINES  Aged Out    Health Maintenance  Health Maintenance Due  Topic Date Due   Pneumonia Vaccine 44+ Years old (3 of 3 - PPSV23 or PCV20) 06/15/2017   COVID-19 Vaccine (7 - 2023-24  season) 02/04/2022   INFLUENZA VACCINE  01/05/2023    Colorectal cancer screening: No longer required.   Mammogram status: Completed 07/27/2022. Repeat every year  Bone Density status: Completed 12/15/2000. Results reflect: Bone density results: OSTEOPOROSIS. Repeat every 2 years.  Lung Cancer Screening: (Low Dose CT Chest recommended if Age 66-80 years, 20 pack-year currently smoking OR have quit w/in 15years.) does not qualify.   Lung Cancer Screening Referral: N/A  Additional Screening:  Hepatitis C Screening: does not qualify;  Vision Screening: Recommended annual ophthalmology exams for early detection of glaucoma and other disorders of the eye. Is the patient up to date with their annual eye exam?  Yes  Who is the provider or what is the name of the office in which the patient attends annual eye exams? Dr. Lottie Dawson If pt is not established with a provider, would they like to be referred to a provider to establish care? No .   Dental Screening: Recommended annual dental exams for proper oral hygiene  Community Resource Referral / Chronic Care Management: CRR required this visit?  No   CCM required this visit?  No     Plan:     I have personally reviewed and noted the following in the patient's chart:   Medical and social history Use of alcohol, tobacco or illicit drugs  Current medications and supplements including opioid prescriptions. Patient is not currently taking opioid prescriptions. Functional ability and status Nutritional status Physical activity Advanced directives List of other physicians Hospitalizations, surgeries, and ER visits in previous 12 months Vitals Screenings to include cognitive, depression, and falls Referrals and appointments  In addition, I have  reviewed and discussed with patient certain preventive protocols, quality metrics, and best practice recommendations. A written personalized care plan for preventive services as well as general  preventive health recommendations were provided to patient.     Charisse Wendell L Jacayla Nordell, CMA   01/17/2023  After Visit Summary: (Mail) Due to this being a telephonic visit, the after visit summary with patients personalized plan was offered to patient via mail   Nurse Notes: Patient is concerned that her Hemoglobin is low due to fatigue.  She stated that she received a DEXA last year with Dr. Talmage Nap, so declined the referral today.  I did not see DEXA from last year in her chart.  She is due for her Flu and Covid vaccines. Patient did not answer all question due to her husband passing away at the end of June.  Patient was very short with me today.  She refused to do the depression screening, due to the recent passing of her husband.

## 2023-01-23 NOTE — Assessment & Plan Note (Signed)
Chronic Dr Riccardo Dubin at [facility] Has ileostomy Stable

## 2023-01-23 NOTE — Assessment & Plan Note (Signed)
Chronic Has lost a little weight which may be related to grieving-of her husband's recent death She does eat fairly healthy Advised trying to increase calorie intake and continue increased protein

## 2023-01-23 NOTE — Assessment & Plan Note (Signed)
Chronic Following with Dr Arbutus Ped Has been stable, but she does feel now that she may need a transfusion-feels more tired and similar to when her hemoglobin is low Will check CBC, iron panel

## 2023-01-23 NOTE — Assessment & Plan Note (Signed)
Chronic Management per Dr Talmage Nap Was on Prolia Currently on [facility]

## 2023-01-23 NOTE — Assessment & Plan Note (Signed)
Chronic Follows with nephrology at [facility] Kidney function stable CMP

## 2023-01-23 NOTE — Assessment & Plan Note (Addendum)
Chronic Following with Dr. Arbutus Ped CBC

## 2023-01-24 LAB — COMPREHENSIVE METABOLIC PANEL
ALT: 23 U/L (ref 0–35)
AST: 23 U/L (ref 0–37)
Albumin: 3.6 g/dL (ref 3.5–5.2)
Alkaline Phosphatase: 66 U/L (ref 39–117)
BUN: 37 mg/dL — ABNORMAL HIGH (ref 6–23)
CO2: 26 meq/L (ref 19–32)
Calcium: 10.1 mg/dL (ref 8.4–10.5)
Chloride: 104 meq/L (ref 96–112)
Creatinine, Ser: 1.64 mg/dL — ABNORMAL HIGH (ref 0.40–1.20)
GFR: 29.24 mL/min — ABNORMAL LOW (ref >60.00)
Glucose, Bld: 103 mg/dL — ABNORMAL HIGH (ref 70–99)
Potassium: 4.3 meq/L (ref 3.5–5.1)
Sodium: 138 meq/L (ref 135–145)
Total Bilirubin: 0.4 mg/dL (ref 0.2–1.2)
Total Protein: 7.4 g/dL (ref 6.0–8.3)

## 2023-01-24 LAB — IBC PANEL
Iron: 55 ug/dL (ref 42–145)
Saturation Ratios: 22.4 % (ref 20.0–50.0)
TIBC: 245 ug/dL — ABNORMAL LOW (ref 250.0–450.0)
Transferrin: 175 mg/dL — ABNORMAL LOW (ref 212.0–360.0)

## 2023-01-25 ENCOUNTER — Telehealth: Payer: Self-pay | Admitting: Internal Medicine

## 2023-01-25 NOTE — Telephone Encounter (Signed)
Patient called and said she did not receive the results of her labs from 01/23/23. She would like to know if they can be manually sent to her MyChart. She is very upset that they are not in her MyChart and she would like a call back from a nurse ASAP. Best callback is [phone].

## 2023-01-25 NOTE — Telephone Encounter (Signed)
Spoke with patient today and she has been informed once labs are addressed Dr. Lawerance Bach will send her a my-chart message.

## 2023-01-26 DIAGNOSIS — H401133 Primary open-angle glaucoma, bilateral, severe stage: Secondary | ICD-10-CM | POA: Diagnosis not present

## 2023-01-26 NOTE — Telephone Encounter (Signed)
Message sent to patient via my-chart.  Called and went over results today.

## 2023-01-27 ENCOUNTER — Ambulatory Visit: Payer: Medicare PPO | Admitting: Emergency Medicine

## 2023-02-03 ENCOUNTER — Other Ambulatory Visit: Payer: Self-pay | Admitting: Internal Medicine

## 2023-02-15 DIAGNOSIS — Z933 Colostomy status: Secondary | ICD-10-CM | POA: Diagnosis not present

## 2023-02-15 DIAGNOSIS — Z932 Ileostomy status: Secondary | ICD-10-CM | POA: Diagnosis not present

## 2023-02-15 DIAGNOSIS — K5090 Crohn's disease, unspecified, without complications: Secondary | ICD-10-CM | POA: Diagnosis not present

## 2023-02-15 DIAGNOSIS — K50011 Crohn's disease of small intestine with rectal bleeding: Secondary | ICD-10-CM | POA: Diagnosis not present

## 2023-02-15 DIAGNOSIS — C189 Malignant neoplasm of colon, unspecified: Secondary | ICD-10-CM | POA: Diagnosis not present

## 2023-02-17 DIAGNOSIS — C189 Malignant neoplasm of colon, unspecified: Secondary | ICD-10-CM | POA: Diagnosis not present

## 2023-02-17 DIAGNOSIS — Z933 Colostomy status: Secondary | ICD-10-CM | POA: Diagnosis not present

## 2023-02-17 DIAGNOSIS — Z932 Ileostomy status: Secondary | ICD-10-CM | POA: Diagnosis not present

## 2023-02-17 DIAGNOSIS — K5090 Crohn's disease, unspecified, without complications: Secondary | ICD-10-CM | POA: Diagnosis not present

## 2023-02-17 DIAGNOSIS — K50011 Crohn's disease of small intestine with rectal bleeding: Secondary | ICD-10-CM | POA: Diagnosis not present

## 2023-03-02 DIAGNOSIS — L659 Nonscarring hair loss, unspecified: Secondary | ICD-10-CM | POA: Diagnosis not present

## 2023-03-02 DIAGNOSIS — L300 Nummular dermatitis: Secondary | ICD-10-CM | POA: Diagnosis not present

## 2023-03-02 DIAGNOSIS — Z85828 Personal history of other malignant neoplasm of skin: Secondary | ICD-10-CM | POA: Diagnosis not present

## 2023-03-16 DIAGNOSIS — C189 Malignant neoplasm of colon, unspecified: Secondary | ICD-10-CM | POA: Diagnosis not present

## 2023-03-16 DIAGNOSIS — Z932 Ileostomy status: Secondary | ICD-10-CM | POA: Diagnosis not present

## 2023-03-16 DIAGNOSIS — K50011 Crohn's disease of small intestine with rectal bleeding: Secondary | ICD-10-CM | POA: Diagnosis not present

## 2023-03-16 DIAGNOSIS — Z933 Colostomy status: Secondary | ICD-10-CM | POA: Diagnosis not present

## 2023-03-16 DIAGNOSIS — K5090 Crohn's disease, unspecified, without complications: Secondary | ICD-10-CM | POA: Diagnosis not present

## 2023-03-23 ENCOUNTER — Ambulatory Visit: Payer: Medicare PPO | Admitting: Emergency Medicine

## 2023-03-23 ENCOUNTER — Encounter: Payer: Self-pay | Admitting: Emergency Medicine

## 2023-03-23 VITALS — HR 72 | Ht <= 58 in | Wt 78.4 lb

## 2023-03-23 DIAGNOSIS — R918 Other nonspecific abnormal finding of lung field: Secondary | ICD-10-CM

## 2023-03-23 DIAGNOSIS — A318 Other mycobacterial infections: Secondary | ICD-10-CM

## 2023-03-23 NOTE — Progress Notes (Signed)
Subjective:    Patient ID: Kim [name], female    DOB: 24-Feb-1941, 82 y.o.   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  She has had pulmonary nodular disease that has been followed in the past by Dr. Pete Glatter.  This prompted a CT scan of the chest that was ordered by Dr. Arbutus Ped and done 11/02/2022, and then a PET scan on 11/28/2022 as below.  Question of atypical infection versus malignancy.  She is not on anti-inflammatory meds, has not been on for 6-7 years.  She does not have cough or SOB. She is able to exert herself and go to the gym. No fevers, chills, sweats.  She is dealing with the recent death of her husband.   CT chest 11/02/2022 reviewed by me small to moderate pericardial effusion, no mediastinal or hilar adenopathy, bronchiectasis with some associated bronchial wall thickening and tree-in-bud infiltrate particularly in the inferior right upper lobe, superior right lower lobe.  Also noted are some larger more confluent areas of masslike spiculated opacity in the right lung particularly in the posterior inferior right upper lobe and inferior medial right upper lobe.  Finally there are some scattered spiculated nodules noted  PET scan 11/24/2022 reviewed by me, shows that the bilateral pulmonary nodules are intensely hypermetabolic.  There are no hypermetabolic mediastinal nodes  ROV 03/23/2023 --follow-up visit for 82 year old woman with history of Crohn's and colectomy, ileostomy, malabsorption and B12 deficiency I saw her for pulmonary nodular disease and some spiculated opacities and associated bronchiectasis that prompted bronchoscopy on 12/26/2022.  Her cytology showed granulomatous inflammation and her AFB culture confirmed Mycobacterium abscessus. Today she reports that shew feels  fairly well. She has some UA irritation, GERD. No sputum or dyspnea. No fevers, sweats, constitutional sx.    Review of Systems As per HPI  Past Medical History:  Diagnosis Date   Anemia    related to malabsorption--gets B12 shots and iron infusions, managed by Dr. Arbutus Ped   [facility] (basal cell carcinoma of skin)    Crohn disease (HCC)    Dr. Haze Boyden at [facility]   Glaucoma    Dr. Eulah Pont   Intestinal malabsorption    OA (osteoarthritis)    hands,back   Osteoporosis    managed by Dr. 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  Vaping Use   Vaping status: Never Used  Substance and Sexual Activity   Alcohol use: No  Drug use: No   Sexual activity: Not Currently  Other Topics Concern   Not on file  Social History Narrative   Husband passed away end of 2022-12-02, no pets.  Children in [place] and [place].  Husband has Parkinsons Disease   Social Determinants of Health   Financial Resource Strain: Low Risk  (01/17/2023)   Overall Financial Resource Strain (CARDIA)    Difficulty of Paying Living Expenses: Not very hard  Food Insecurity: No Food Insecurity (01/17/2023)   Hunger Vital Sign    Worried About Running Out of Food in the Last Year: Never true    Ran Out of Food in the Last Year: Never true  Transportation Needs: No  Transportation Needs (01/17/2023)   PRAPARE - Administrator, Civil Service (Medical): No    Lack of Transportation (Non-Medical): No  Physical Activity: Sufficiently Active (01/17/2023)   Exercise Vital Sign    Days of Exercise per Week: 6 days    Minutes of Exercise per Session: 40 min  Stress: Stress Concern Present (01/17/2023)   [employer] of Occupational Health - Occupational Stress Questionnaire    Feeling of Stress : Very much  Social Connections: Moderately Isolated (01/17/2023)   Social Connection and Isolation Panel [NHANES]    Frequency of Communication with Friends and Family: More than three times a week    Frequency of Social Gatherings with Friends and Family: Twice a week    Attends Religious Services: Never    Database administrator or Organizations: Yes    Attends Banker Meetings: 1 to 4 times per year    Marital Status: Widowed  Intimate Partner Violence: Not At Risk (01/17/2023)   Humiliation, Afraid, Rape, and Kick questionnaire    Fear of Current or Ex-Partner: No    Emotionally Abused: No    Physically Abused: No    Sexually Abused: No     Allergies  Allergen Reactions   Antihistamines, Chlorpheniramine-Type     Dry  Mouth, dry eyes-so dry she cannot even talk   Chlorpheniramine Other (See Comments)    Dry  Mouth, dry eyes-so dry she cannot even talk   Hydrocodone-Acetaminophen Nausea And Vomiting   Metronidazole Other (See Comments)    Peripheral neuropathy    Other Diarrhea and Nausea And Vomiting    Patient has an Ileostomy Patient has an Energy manager, Antihistamine    Sulfa Antibiotics Other (See Comments)    UNKNOWN   Tape Other (See Comments)    SKIN WILL TEAR WITH ANYTHING OTHER THAN PAPER TAPE OR COBAN WRAP     Wound Dressing Adhesive Other (See Comments)    SKIN WILL TEAR WITH ANYTHING OTHER THAN PAPER TAPE OR COBAN WRAP   Chlorhexidine Itching and Rash     Outpatient Medications Prior to  Visit  Medication Sig Dispense Refill   Abaloparatide (TYMLOS) 3120 MCG/1.56ML SOPN Inject 1.56 mLs into the skin at bedtime.     acetaminophen (TYLENOL) 500 MG tablet Take 1,000 mg by mouth every morning.     B-D 3CC LUER-LOK SYR 22GX1-1/2 22G X 1-1/2" 3 ML MISC USE AS DIRECTED 3 each PRN   B-D 3CC LUER-LOK SYR 25GX5/8" 25G X 5/8" 3 ML MISC USE AS DIRECTED. 3 each PRN   brimonidine (ALPHAGAN) 0.2 % ophthalmic solution Place 1 drop into both eyes 3 (three) times daily.     Calcium Citrate-Vitamin D 200-125 MG-UNIT TABS Take 1-2 tablets by mouth See admin instructions. Take 2 tab  in the morning and 1 tab at bedtime     Cholecalciferol (VITAMIN D3) 50 MCG (2000 UT) TABS Take 2,000 Units by mouth daily.     cyanocobalamin (VITAMIN B12) 1000 MCG/ML injection INJECT 1 ML IM ONCE A MONTH. 3 mL PRN   dorzolamide-timolol (COSOPT) 2-0.5 % ophthalmic solution Place 1 drop into both eyes 2 (two) times daily.     Fluocinolone Acetonide Body 0.01 % OIL Apply topically.     gabapentin (NEURONTIN) 300 MG capsule Take 600 mg by mouth at bedtime.     loratadine (CLARITIN) 10 MG tablet Take 10 mg by mouth daily. [place]     Melatonin 10 MG TBCR Take 10 mg by mouth at bedtime.     Multiple Vitamins-Minerals (ONE-A-DAY WOMENS 50+ ADVANTAGE) TABS Take 1 tablet by mouth daily.     ROCKLATAN 0.02-0.005 % SOLN Place 1 drop into both eyes daily.     Teriparatide 600 MCG/2.4ML SOPN Subcutaneous once a day for 30 days     triamcinolone cream (KENALOG) 0.1 % Apply 1 application  topically every other day.     potassium chloride 40 MEQ/15ML (20%) LIQD Take 15 mLs (40 mEq total) by mouth as directed. 150 mL 0   No facility-administered medications prior to visit.        Objective:   Physical Exam  Vitals:   03/23/23 1412  Pulse: 72  SpO2: 100%  Weight: 78 lb 6.4 oz (35.6 kg)  Height: 4\' 8"  (1.422 m)   Gen: Pleasant, very thin, in no distress,  normal affect  ENT: No lesions,  mouth clear,  oropharynx  clear, no postnasal drip  Neck: No JVD, no stridor  Lungs: No use of accessory muscles, few scattered inspiratory rhonchi, no wheeze or crackles  Cardiovascular: RRR, heart sounds normal, no murmur or gallops, no peripheral edema  Musculoskeletal: No deformities, no cyanosis or clubbing  Neuro: alert, awake, non focal  Skin: Warm, no lesions or rash     Assessment & Plan:   Mycobacterium abscessus infection Her bronchoscopy was cytology negative, granulomas present and she is growing out Mycobacterium abscessus.  We talked about the pros and cons of treatment.  She is asymptomatic.  For now we will hold off on treatment and recheck her CT chest at the 61-month mark which would be December.  If she develops clinical symptoms or if her CT evolves then we will talk about a treatment regimen    Levy Pupa, MD, PhD 03/23/2023, 2:40 PM [facility] [phone] or if no answer before 7:00PM call [phone] For any issues after 7:00PM please call eLink [phone]

## 2023-03-23 NOTE — Patient Instructions (Signed)
We reviewed your bronchoscopy culture results today.  This shows Mycobacterium abscessus.  We will hold off on treatment of this infection for now since you are feeling well We will repeat your CT scan of the chest without contrast in December 2024 to follow for interval stability or improvement. Follow with Dr. Delton Coombes in December after the scan so we can review those results together

## 2023-03-23 NOTE — Assessment & Plan Note (Signed)
Her bronchoscopy was cytology negative, granulomas present and she is growing out Mycobacterium abscessus.  We talked about the pros and cons of treatment.  She is asymptomatic.  For now we will hold off on treatment and recheck her CT chest at the 82-month mark which would be December.  If she develops clinical symptoms or if her CT evolves then we will talk about a treatment regimen

## 2023-03-23 NOTE — Telephone Encounter (Signed)
Pt being seen in office today, closing encounter

## 2023-04-18 DIAGNOSIS — Z932 Ileostomy status: Secondary | ICD-10-CM | POA: Diagnosis not present

## 2023-04-18 DIAGNOSIS — K50011 Crohn's disease of small intestine with rectal bleeding: Secondary | ICD-10-CM | POA: Diagnosis not present

## 2023-04-18 DIAGNOSIS — C189 Malignant neoplasm of colon, unspecified: Secondary | ICD-10-CM | POA: Diagnosis not present

## 2023-04-18 DIAGNOSIS — K5090 Crohn's disease, unspecified, without complications: Secondary | ICD-10-CM | POA: Diagnosis not present

## 2023-04-18 DIAGNOSIS — Z933 Colostomy status: Secondary | ICD-10-CM | POA: Diagnosis not present

## 2023-05-03 DIAGNOSIS — H401133 Primary open-angle glaucoma, bilateral, severe stage: Secondary | ICD-10-CM | POA: Diagnosis not present

## 2023-05-11 DIAGNOSIS — Z961 Presence of intraocular lens: Secondary | ICD-10-CM | POA: Diagnosis not present

## 2023-05-11 DIAGNOSIS — H01005 Unspecified blepharitis left lower eyelid: Secondary | ICD-10-CM | POA: Diagnosis not present

## 2023-05-11 DIAGNOSIS — H01002 Unspecified blepharitis right lower eyelid: Secondary | ICD-10-CM | POA: Diagnosis not present

## 2023-05-11 DIAGNOSIS — H401133 Primary open-angle glaucoma, bilateral, severe stage: Secondary | ICD-10-CM | POA: Diagnosis not present

## 2023-05-17 ENCOUNTER — Ambulatory Visit
Admission: RE | Admit: 2023-05-17 | Discharge: 2023-05-17 | Disposition: A | Discharge status: Home / Self Care | Payer: Medicare PPO | Source: Ambulatory Visit | Attending: Emergency Medicine | Admitting: Emergency Medicine

## 2023-05-17 DIAGNOSIS — R918 Other nonspecific abnormal finding of lung field: Secondary | ICD-10-CM

## 2023-05-17 DIAGNOSIS — I700 Atherosclerosis of aorta: Secondary | ICD-10-CM | POA: Diagnosis not present

## 2023-05-17 DIAGNOSIS — I3139 Other pericardial effusion (noninflammatory): Secondary | ICD-10-CM | POA: Diagnosis not present

## 2023-05-26 DIAGNOSIS — E559 Vitamin D deficiency, unspecified: Secondary | ICD-10-CM | POA: Diagnosis not present

## 2023-05-26 DIAGNOSIS — Z932 Ileostomy status: Secondary | ICD-10-CM | POA: Diagnosis not present

## 2023-05-26 DIAGNOSIS — Z9049 Acquired absence of other specified parts of digestive tract: Secondary | ICD-10-CM | POA: Diagnosis not present

## 2023-05-26 DIAGNOSIS — K50818 Crohn's disease of both small and large intestine with other complication: Secondary | ICD-10-CM | POA: Diagnosis not present

## 2023-05-26 DIAGNOSIS — D638 Anemia in other chronic diseases classified elsewhere: Secondary | ICD-10-CM | POA: Diagnosis not present

## 2023-05-26 DIAGNOSIS — N183 Chronic kidney disease, stage 3 unspecified: Secondary | ICD-10-CM | POA: Diagnosis not present

## 2023-05-29 DIAGNOSIS — C189 Malignant neoplasm of colon, unspecified: Secondary | ICD-10-CM | POA: Diagnosis not present

## 2023-05-29 DIAGNOSIS — K5090 Crohn's disease, unspecified, without complications: Secondary | ICD-10-CM | POA: Diagnosis not present

## 2023-05-29 DIAGNOSIS — K50011 Crohn's disease of small intestine with rectal bleeding: Secondary | ICD-10-CM | POA: Diagnosis not present

## 2023-05-29 DIAGNOSIS — Z933 Colostomy status: Secondary | ICD-10-CM | POA: Diagnosis not present

## 2023-05-29 DIAGNOSIS — Z932 Ileostomy status: Secondary | ICD-10-CM | POA: Diagnosis not present

## 2023-06-06 ENCOUNTER — Telehealth: Payer: Self-pay | Admitting: Emergency Medicine

## 2023-06-06 NOTE — Telephone Encounter (Signed)
Please call PT w/CT results. Her # is [phone]

## 2023-06-09 NOTE — Telephone Encounter (Signed)
 Forwarding to Dr Delton Coombes to review. Not urgent.

## 2023-06-12 NOTE — Telephone Encounter (Signed)
 Please let the patient know that her CT chest shows improvement in her inflammatory nodular changers compared with prior films. Good news. We can continue to follow as long as she is not having new probl;ems.

## 2023-06-12 NOTE — Telephone Encounter (Signed)
 Patitent aware of results. She is requesting  a copy be mailed. NFN

## 2023-06-14 ENCOUNTER — Other Ambulatory Visit: Payer: Medicare PPO

## 2023-06-14 ENCOUNTER — Ambulatory Visit: Payer: Medicare PPO | Admitting: Internal Medicine

## 2023-06-14 NOTE — Telephone Encounter (Signed)
 NFN

## 2023-06-21 ENCOUNTER — Inpatient Hospital Stay: Payer: Medicare PPO | Admitting: Internal Medicine

## 2023-06-21 ENCOUNTER — Inpatient Hospital Stay: Payer: Medicare PPO | Attending: Internal Medicine

## 2023-06-21 VITALS — BP 99/53 | HR 63 | Temp 97.3°F | Resp 15 | Ht <= 58 in | Wt 75.0 lb

## 2023-06-21 DIAGNOSIS — D508 Other iron deficiency anemias: Secondary | ICD-10-CM | POA: Insufficient documentation

## 2023-06-21 DIAGNOSIS — K219 Gastro-esophageal reflux disease without esophagitis: Secondary | ICD-10-CM | POA: Diagnosis not present

## 2023-06-21 DIAGNOSIS — N189 Chronic kidney disease, unspecified: Secondary | ICD-10-CM | POA: Insufficient documentation

## 2023-06-21 DIAGNOSIS — K5090 Crohn's disease, unspecified, without complications: Secondary | ICD-10-CM | POA: Insufficient documentation

## 2023-06-21 DIAGNOSIS — R918 Other nonspecific abnormal finding of lung field: Secondary | ICD-10-CM | POA: Diagnosis not present

## 2023-06-21 DIAGNOSIS — D500 Iron deficiency anemia secondary to blood loss (chronic): Secondary | ICD-10-CM

## 2023-06-21 DIAGNOSIS — R7989 Other specified abnormal findings of blood chemistry: Secondary | ICD-10-CM | POA: Insufficient documentation

## 2023-06-21 DIAGNOSIS — K909 Intestinal malabsorption, unspecified: Secondary | ICD-10-CM | POA: Diagnosis not present

## 2023-06-21 LAB — CBC WITH DIFFERENTIAL (CANCER CENTER ONLY)
Abs Immature Granulocytes: 0.03 10*3/uL (ref 0.00–0.07)
Basophils Absolute: 0 10*3/uL (ref 0.0–0.1)
Basophils Relative: 1 %
Eosinophils Absolute: 0.1 10*3/uL (ref 0.0–0.5)
Eosinophils Relative: 1 %
HCT: 32.2 % — ABNORMAL LOW (ref 36.0–46.0)
Hemoglobin: 10.8 g/dL — ABNORMAL LOW (ref 12.0–15.0)
Immature Granulocytes: 0 %
Lymphocytes Relative: 11 %
Lymphs Abs: 0.9 10*3/uL (ref 0.7–4.0)
MCH: 32.3 pg (ref 26.0–34.0)
MCHC: 33.5 g/dL (ref 30.0–36.0)
MCV: 96.4 fL (ref 80.0–100.0)
Monocytes Absolute: 0.5 10*3/uL (ref 0.1–1.0)
Monocytes Relative: 6 %
Neutro Abs: 6.9 10*3/uL (ref 1.7–7.7)
Neutrophils Relative %: 81 %
Platelet Count: 247 10*3/uL (ref 150–400)
RBC: 3.34 MIL/uL — ABNORMAL LOW (ref 3.87–5.11)
RDW: 12.4 % (ref 11.5–15.5)
WBC Count: 8.5 10*3/uL (ref 4.0–10.5)
nRBC: 0 % (ref 0.0–0.2)

## 2023-06-21 LAB — FERRITIN: Ferritin: 961 ng/mL — ABNORMAL HIGH (ref 11–307)

## 2023-06-21 LAB — IRON AND IRON BINDING CAPACITY (CC-WL,HP ONLY)
Iron: 67 ug/dL (ref 28–170)
Saturation Ratios: 28 % (ref 10.4–31.8)
TIBC: 241 ug/dL — ABNORMAL LOW (ref 250–450)
UIBC: 174 ug/dL (ref 148–442)

## 2023-06-21 LAB — CMP (CANCER CENTER ONLY)
ALT: 25 U/L (ref 0–44)
AST: 24 U/L (ref 15–41)
Albumin: 3.7 g/dL (ref 3.5–5.0)
Alkaline Phosphatase: 54 U/L (ref 38–126)
Anion gap: 9 (ref 5–15)
BUN: 92 mg/dL — ABNORMAL HIGH (ref 8–23)
CO2: 22 mmol/L (ref 22–32)
Calcium: 10.2 mg/dL (ref 8.9–10.3)
Chloride: 103 mmol/L (ref 98–111)
Creatinine: 2.41 mg/dL — ABNORMAL HIGH (ref 0.44–1.00)
GFR, Estimated: 20 mL/min — ABNORMAL LOW (ref >60)
Glucose, Bld: 116 mg/dL — ABNORMAL HIGH (ref 70–99)
Potassium: 4.3 mmol/L (ref 3.5–5.1)
Sodium: 134 mmol/L — ABNORMAL LOW (ref 135–145)
Total Bilirubin: 0.4 mg/dL (ref 0.0–1.2)
Total Protein: 7.6 g/dL (ref 6.5–8.1)

## 2023-06-21 LAB — VITAMIN B12: Vitamin B-12: 1780 pg/mL — ABNORMAL HIGH (ref 180–914)

## 2023-06-21 LAB — FOLATE: Folate: >40 ng/mL (ref >5.9)

## 2023-06-21 NOTE — Progress Notes (Signed)
 [facility] [phone]   [phone]  OFFICE PROGRESS NOTE  Colene Dauphin, MD [address] [place] [address]  DIAGNOSIS: 1) Iron-deficiency anemia secondary to malabsorption syndrome, secondary to Crohn disease and colon resection.  2) bilateral hypermetabolic lung opacities suspicious for inflammatory process versus malignancy.  PRIOR THERAPY:  1) PRBCs transfusion as needed last was given few days ago.   CURRENT THERAPY:  1) Intravenous Feraheme  infusion on as-needed basis. Last dose was given on last infusion was on 09/18/2015 2) vitamin B12 injection on monthly basis.  INTERVAL HISTORY: Kim [name] 83 y.o. female returns to the clinic today for 67-month follow-up visit.Discussed the use of AI scribe software for clinical note transcription with the patient, who gave verbal consent to proceed.  History of Present Illness   The patient, an 83 year old with a history of Crohn's disease and colon cancer, has been under surveillance for pulmonary nodules. She underwent a bronchoscopy, which returned negative results, indicating no malignancy. However, the patient expressed dissatisfaction with the pulmonologist's office care, citing difficulties with communication and accessibility due to visual impairment from glaucoma. She has decided to discontinue care with the pulmonologist and instead rely on her oncologist for monitoring of the pulmonary nodules.  The patient also reported a persistent sensation of phlegm in her throat, which she believes may be due to acid reflux. She is not currently taking any medication for this symptom.  In addition, the patient has been managing anemia, which has remained stable with a hemoglobin level of 10.8. She has been making dietary changes, including increased consumption of chicken breast, in an attempt to improve her anemia. However, she has expressed difficulty in incorporating iron-rich foods  into her diet due to dietary restrictions and digestive issues related to her Crohn's disease.  The patient also noted a recent weight loss and expressed a desire to resume exercise, which has been disrupted by cold weather. She has a history of kidney disease, and recent labs showed an increase in creatinine to 2.4, indicating worsening renal function. The patient is scheduled to see a nephrologist in the summer for further evaluation and management.       MEDICAL HISTORY: Past Medical History:  Diagnosis Date   Anemia    related to malabsorption--gets B12 shots and iron infusions, managed by Dr. Marguerita Shih   [facility] (basal cell carcinoma of skin)    Crohn disease (HCC)    Dr. Ernesto [facility]ady at [facility]   Glaucoma    Dr. Lyndol Santee   Intestinal malabsorption    OA (osteoarthritis)    hands,back   Osteoporosis    managed by Dr. Del Favia at [facility], [place] q2 yrs    ALLERGIES:  is allergic to antihistamines, chlorpheniramine-type; chlorpheniramine; hydrocodone -acetaminophen ; metronidazole; other; sulfa antibiotics; tape; wound dressing adhesive; and chlorhexidine .  MEDICATIONS:  Current Outpatient Medications  Medication Sig Dispense Refill   Abaloparatide (TYMLOS) 3120 MCG/1.56ML SOPN Inject 1.56 mLs into the skin at bedtime.     acetaminophen  (TYLENOL ) 500 MG tablet Take 1,000 mg by mouth every morning.     B-D 3CC LUER-LOK SYR 22GX1-1/2 22G X 1-1/2" 3 ML MISC USE AS DIRECTED 3 each PRN   B-D 3CC LUER-LOK SYR 25GX5/8" 25G X 5/8" 3 ML MISC USE AS DIRECTED. 3 each PRN   brimonidine (ALPHAGAN) 0.2 % ophthalmic solution Place 1 drop into both eyes 3 (three) times daily.     Calcium  Citrate-Vitamin D  200-125 MG-UNIT TABS Take 1-2 tablets by  mouth See admin instructions. Take 2 tab in the morning and 1 tab at bedtime     Cholecalciferol  (VITAMIN D3) 50 MCG (2000 UT) TABS Take 2,000 Units by mouth daily.     cyanocobalamin  (VITAMIN B12) 1000 MCG/ML injection INJECT 1 ML IM ONCE A MONTH. 3 mL PRN   dorzolamide -timolol   (COSOPT ) 2-0.5 % ophthalmic solution Place 1 drop into both eyes 2 (two) times daily.     Fluocinolone Acetonide Body 0.01 % OIL Apply topically.     gabapentin  (NEURONTIN ) 300 MG capsule Take 600 mg by mouth at bedtime.     loratadine (CLARITIN) 10 MG tablet Take 10 mg by mouth daily. [place]     Melatonin 10 MG TBCR Take 10 mg by mouth at bedtime.     Multiple Vitamins-Minerals (ONE-A-DAY WOMENS 50+ ADVANTAGE) TABS Take 1 tablet by mouth daily.     ROCKLATAN 0.02-0.005 % SOLN Place 1 drop into both eyes daily.     Teriparatide 600 MCG/2.4ML SOPN 20mcg Subcutaneous once a day for 30 days     triamcinolone cream (KENALOG) 0.1 % Apply 1 application  topically every other day.     No current facility-administered medications for this visit.    SURGICAL HISTORY:  Past Surgical History:  Procedure Laterality Date   BREAST EXCISIONAL BIOPSY Right    BRONCHIAL BIOPSY  12/26/2022   Procedure: BRONCHIAL BIOPSIES;  Surgeon: Denson Flake, MD;  Location: [facility] ENDOSCOPY;  Service: Pulmonary;;   BRONCHIAL BRUSHINGS  12/26/2022   Procedure: BRONCHIAL BRUSHINGS;  Surgeon: Denson Flake, MD;  Location: [facility] ENDOSCOPY;  Service: Pulmonary;;   BRONCHIAL NEEDLE ASPIRATION BIOPSY  12/26/2022   Procedure: BRONCHIAL NEEDLE ASPIRATION BIOPSIES;  Surgeon: Denson Flake, MD;  Location: [facility] ENDOSCOPY;  Service: Pulmonary;;   BRONCHIAL WASHINGS  12/26/2022   Procedure: BRONCHIAL WASHINGS;  Surgeon: Denson Flake, MD;  Location: [facility] ENDOSCOPY;  Service: Pulmonary;;   Crohn  2009   internal ostomy pouch  1994   INTRAMEDULLARY (IM) NAIL INTERTROCHANTERIC Left 03/05/2016   Procedure: INTRAMEDULLARY (IM) NAIL INTERTROCHANTRIC;  Surgeon: Janeth Medicus, MD;  Location: [facility] OR;  Service: Orthopedics;  Laterality: Left;   REDUCTION MAMMAPLASTY Bilateral 01/2017   TOTAL COLECTOMY  1984   due to Crohn's    REVIEW OF SYSTEMS:  Constitutional: positive for fatigue Eyes: negative Ears, nose, mouth, throat, and face:  negative Respiratory: positive for cough Cardiovascular: negative Gastrointestinal: negative Genitourinary:negative Integument/breast: negative Hematologic/lymphatic: negative Musculoskeletal:negative Neurological: negative Behavioral/Psych: negative Endocrine: negative Allergic/Immunologic: negative   PHYSICAL EXAMINATION: General appearance: alert, cooperative, fatigued, and no distress Head: Normocephalic, without obvious abnormality, atraumatic Neck: no adenopathy, no JVD, supple, symmetrical, trachea midline, and thyroid not enlarged, symmetric, no tenderness/mass/nodules Lymph nodes: Cervical, supraclavicular, and axillary nodes normal. Resp: clear to auscultation bilaterally Back: symmetric, no curvature. ROM normal. No CVA tenderness. Cardio: regular rate and rhythm, S1, S2 normal, no murmur, click, rub or gallop GI: soft, non-tender; bowel sounds normal; no masses,  no organomegaly Extremities: extremities normal, atraumatic, no cyanosis or edema Neurologic: Alert and oriented X 3, normal strength and tone. Normal symmetric reflexes. Normal coordination and gait  ECOG PERFORMANCE STATUS: 1 - Symptomatic but completely ambulatory  Blood pressure (!) 99/53, pulse 63, temperature (!) 97.3 F (36.3 C), temperature source Temporal, resp. rate 15, height 4\' 8"  (1.422 m), weight 75 lb (34 kg), SpO2 100%.  LABORATORY DATA: Lab Results  Component Value Date   WBC 8.5 06/21/2023   HGB 10.8 (L) 06/21/2023   HCT  32.2 (L) 06/21/2023   MCV 96.4 06/21/2023   PLT 247 06/21/2023      Chemistry      Component Value Date/Time   NA 138 01/23/2023 1547   NA 140 11/17/2016 1134   K 4.3 01/23/2023 1547   K 3.9 11/17/2016 1134   CL 104 01/23/2023 1547   CO2 26 01/23/2023 1547   CO2 22 11/17/2016 1134   BUN 37 (H) 01/23/2023 1547   BUN 29.7 (H) 11/17/2016 1134   CREATININE 1.64 (H) 01/23/2023 1547   CREATININE 1.51 (H) 10/13/2021 1110   CREATININE 1.4 (H) 11/17/2016 1134       Component Value Date/Time   CALCIUM  10.1 01/23/2023 1547   CALCIUM  10.0 11/17/2016 1134   ALKPHOS 66 01/23/2023 1547   ALKPHOS 74 11/17/2016 1134   AST 23 01/23/2023 1547   AST 28 10/13/2021 1110   AST 21 11/17/2016 1134   ALT 23 01/23/2023 1547   ALT 28 10/13/2021 1110   ALT 19 11/17/2016 1134   BILITOT 0.4 01/23/2023 1547   BILITOT 0.5 10/13/2021 1110   BILITOT 0.36 11/17/2016 1134        RADIOGRAPHIC STUDIES: No results found.   ASSESSMENT AND PLAN:  This is a very pleasant 83 years old white female with history of iron deficiency anemia/anemia of chronic disease secondary to malabsorption and Crohn's disease. The patient require Feraheme  infusion at intermittent basis because of her blood loss from the colostomy as well as the malabsorption from Crohn's disease. The patient is currently on vitamin B12 supplement and she is feeling fine. For the last 4 years the patient has been doing very well with no concerning complaints or requirement for blood transfusion or iron infusion. Regarding her anemia, she has been doing fine with no concerning complaints except for mild fatigue. For the history of Crohn's disease, she is followed by Dr. Ernesto Heady at [facility]. She also has history of pulmonary opacities that were suspicious for atypical infection.  She was seen by Dr. Baldwin Levee.    Pulmonary Nodules Pulmonary nodules previously investigated with PET scan and bronchoscopy, both negative. Recent scan on December 11 showed improvement. Patient prefers future scans managed by current doctor due to communication issues with pulmonologist's office. Discussed monitoring for worsening symptoms and potential referral if condition deteriorates. - Order chest scan in December - Monitor for worsening symptoms and refer to pulmonologist if necessary  Anemia Chronic anemia with improving hemoglobin levels (current: 10.8, August: 10.3, July: 9.9). Patient making dietary changes but avoids red  meat and spinach. Discussed alternative iron sources like liver and potential need for iron infusion if levels are very low. - Monitor hemoglobin levels - Consider iron infusion if iron levels are low - Encourage consumption of iron-rich foods like liver  Chronic Kidney Disease Elevated creatinine levels (current: 2.4, previously: 1.5-1.6). Patient under nephrologist care with an upcoming summer appointment. Discussed importance of monitoring kidney function and maintaining hydration. Advised earlier nephrologist follow-up due to significant creatinine increase. - Encourage hydration - Follow up with nephrologist sooner if possible - Monitor kidney function regularly  Gastroesophageal Reflux Disease (GERD) Symptoms of acid reflux with phlegm. Patient not on GERD medication. Discussed use of over-the-counter Prilosec as needed and advised taking it a few days a week if symptoms persist. - Recommend over-the-counter Prilosec as needed - Advise taking medication a few days a week if symptoms persist  General Health Maintenance Patient expressed need to resume exercise after inactivity due to cold weather. - Encourage  regular physical activity as tolerated  Follow-up - Schedule follow-up appointment in six months - Order chest scan during next visit.   The patient was advised to call immediately if she has any other concerning symptoms in the interval. The patient voices understanding of current disease status and treatment options and is in agreement with the current care plan. All questions were answered. The patient knows to call the clinic with any problems, questions or concerns. We can certainly see the patient much sooner if necessary.  Disclaimer: This note was dictated with voice recognition software. Similar sounding words can inadvertently be transcribed and may be missed upon review.

## 2023-06-27 ENCOUNTER — Telehealth: Payer: Self-pay | Admitting: Internal Medicine

## 2023-06-27 DIAGNOSIS — N184 Chronic kidney disease, stage 4 (severe): Secondary | ICD-10-CM

## 2023-06-27 NOTE — Telephone Encounter (Signed)
Spoke with patient, aware of referral being placed

## 2023-06-27 NOTE — Telephone Encounter (Signed)
Okay to place referral

## 2023-06-27 NOTE — Telephone Encounter (Unsigned)
Copied from CRM [id number]. Topic: Referral - Request for Referral >> Jun 27, 2023  3:19 PM Irine Seal wrote: Did the patient discuss referral with their provider in the last year? No, pt declined wanted to speak with nurse or provider, asked to relay the message  (If No - schedule appointment) (If Yes - send message)  Appointment offered? No  Type of order/referral and detailed reason for visit: Pt was seen by Si Gaul, MD who ran labs and stated that her kidney levels were now concerning, he encouraged her to get a referral for a nephrologist. Pt stated that she had a previous nephrologist at [facility], but due to her husbands passing, and her glaucoma she has no transportation and cannot drive herself to keep make that drive   Preference of office, provider, location: pt only wants to see Dr. Allena Katz at [facility]   If referral order, have you been seen by this specialty before? Yes (If Yes, this issue or another issue? When? Where?  Can we respond through MyChart? Yes

## 2023-06-27 NOTE — Telephone Encounter (Signed)
Referral was ordered. 

## 2023-07-10 DIAGNOSIS — Z932 Ileostomy status: Secondary | ICD-10-CM | POA: Diagnosis not present

## 2023-07-10 DIAGNOSIS — Z933 Colostomy status: Secondary | ICD-10-CM | POA: Diagnosis not present

## 2023-07-10 DIAGNOSIS — C189 Malignant neoplasm of colon, unspecified: Secondary | ICD-10-CM | POA: Diagnosis not present

## 2023-07-10 DIAGNOSIS — K50011 Crohn's disease of small intestine with rectal bleeding: Secondary | ICD-10-CM | POA: Diagnosis not present

## 2023-07-10 DIAGNOSIS — K5090 Crohn's disease, unspecified, without complications: Secondary | ICD-10-CM | POA: Diagnosis not present

## 2023-07-12 ENCOUNTER — Other Ambulatory Visit: Payer: Self-pay | Admitting: Internal Medicine

## 2023-07-12 DIAGNOSIS — Z1231 Encounter for screening mammogram for malignant neoplasm of breast: Secondary | ICD-10-CM

## 2023-07-25 ENCOUNTER — Telehealth: Payer: Self-pay | Admitting: Internal Medicine

## 2023-07-25 NOTE — Telephone Encounter (Signed)
[facility] Clinical Research Coordinator note:   This visit for Subject Kim [name] with DOB: [birth year] on 07/25/2023. Subject contacted regarding [id number] to inform that Principal Investigator has changed to Dr. Delton Coombes. Patient expressed understanding.

## 2023-07-31 DIAGNOSIS — H401133 Primary open-angle glaucoma, bilateral, severe stage: Secondary | ICD-10-CM | POA: Diagnosis not present

## 2023-08-02 ENCOUNTER — Ambulatory Visit
Admission: RE | Admit: 2023-08-02 | Discharge: 2023-08-02 | Disposition: A | Discharge status: Home / Self Care | Payer: Medicare PPO | Source: Ambulatory Visit | Attending: Internal Medicine | Admitting: Internal Medicine

## 2023-08-02 DIAGNOSIS — Z1231 Encounter for screening mammogram for malignant neoplasm of breast: Secondary | ICD-10-CM

## 2023-08-03 DIAGNOSIS — N183 Chronic kidney disease, stage 3 unspecified: Secondary | ICD-10-CM | POA: Diagnosis not present

## 2023-08-09 DIAGNOSIS — N2581 Secondary hyperparathyroidism of renal origin: Secondary | ICD-10-CM | POA: Diagnosis not present

## 2023-08-09 DIAGNOSIS — N1832 Chronic kidney disease, stage 3b: Secondary | ICD-10-CM | POA: Diagnosis not present

## 2023-08-09 DIAGNOSIS — D631 Anemia in chronic kidney disease: Secondary | ICD-10-CM | POA: Diagnosis not present

## 2023-08-24 DIAGNOSIS — D2262 Melanocytic nevi of left upper limb, including shoulder: Secondary | ICD-10-CM | POA: Diagnosis not present

## 2023-08-24 DIAGNOSIS — Z85828 Personal history of other malignant neoplasm of skin: Secondary | ICD-10-CM | POA: Diagnosis not present

## 2023-08-24 DIAGNOSIS — L900 Lichen sclerosus et atrophicus: Secondary | ICD-10-CM | POA: Diagnosis not present

## 2023-08-24 DIAGNOSIS — L821 Other seborrheic keratosis: Secondary | ICD-10-CM | POA: Diagnosis not present

## 2023-08-29 DIAGNOSIS — Z933 Colostomy status: Secondary | ICD-10-CM | POA: Diagnosis not present

## 2023-08-29 DIAGNOSIS — K50011 Crohn's disease of small intestine with rectal bleeding: Secondary | ICD-10-CM | POA: Diagnosis not present

## 2023-08-29 DIAGNOSIS — C189 Malignant neoplasm of colon, unspecified: Secondary | ICD-10-CM | POA: Diagnosis not present

## 2023-08-29 DIAGNOSIS — K5090 Crohn's disease, unspecified, without complications: Secondary | ICD-10-CM | POA: Diagnosis not present

## 2023-08-29 DIAGNOSIS — Z932 Ileostomy status: Secondary | ICD-10-CM | POA: Diagnosis not present

## 2023-09-04 DIAGNOSIS — H401133 Primary open-angle glaucoma, bilateral, severe stage: Secondary | ICD-10-CM | POA: Diagnosis not present

## 2023-09-27 DIAGNOSIS — M810 Age-related osteoporosis without current pathological fracture: Secondary | ICD-10-CM | POA: Diagnosis not present

## 2023-10-04 DIAGNOSIS — N189 Chronic kidney disease, unspecified: Secondary | ICD-10-CM | POA: Diagnosis not present

## 2023-10-04 DIAGNOSIS — M810 Age-related osteoporosis without current pathological fracture: Secondary | ICD-10-CM | POA: Diagnosis not present

## 2023-10-04 DIAGNOSIS — K5090 Crohn's disease, unspecified, without complications: Secondary | ICD-10-CM | POA: Diagnosis not present

## 2023-10-04 DIAGNOSIS — S72009A Fracture of unspecified part of neck of unspecified femur, initial encounter for closed fracture: Secondary | ICD-10-CM | POA: Diagnosis not present

## 2023-10-17 DIAGNOSIS — Z933 Colostomy status: Secondary | ICD-10-CM | POA: Diagnosis not present

## 2023-10-17 DIAGNOSIS — K50011 Crohn's disease of small intestine with rectal bleeding: Secondary | ICD-10-CM | POA: Diagnosis not present

## 2023-10-17 DIAGNOSIS — Z932 Ileostomy status: Secondary | ICD-10-CM | POA: Diagnosis not present

## 2023-10-17 DIAGNOSIS — K5090 Crohn's disease, unspecified, without complications: Secondary | ICD-10-CM | POA: Diagnosis not present

## 2023-10-17 DIAGNOSIS — C189 Malignant neoplasm of colon, unspecified: Secondary | ICD-10-CM | POA: Diagnosis not present

## 2023-10-24 DIAGNOSIS — H401133 Primary open-angle glaucoma, bilateral, severe stage: Secondary | ICD-10-CM | POA: Diagnosis not present

## 2023-10-24 DIAGNOSIS — H35373 Puckering of macula, bilateral: Secondary | ICD-10-CM | POA: Diagnosis not present

## 2023-10-24 DIAGNOSIS — Z961 Presence of intraocular lens: Secondary | ICD-10-CM | POA: Diagnosis not present

## 2023-11-05 DIAGNOSIS — N83291 Other ovarian cyst, right side: Secondary | ICD-10-CM | POA: Diagnosis not present

## 2023-11-05 DIAGNOSIS — N83292 Other ovarian cyst, left side: Secondary | ICD-10-CM | POA: Diagnosis not present

## 2023-11-05 DIAGNOSIS — K50818 Crohn's disease of both small and large intestine with other complication: Secondary | ICD-10-CM | POA: Diagnosis not present

## 2023-11-06 DIAGNOSIS — R509 Fever, unspecified: Secondary | ICD-10-CM | POA: Diagnosis not present

## 2023-11-06 DIAGNOSIS — K50818 Crohn's disease of both small and large intestine with other complication: Secondary | ICD-10-CM | POA: Diagnosis not present

## 2023-11-07 DIAGNOSIS — K50818 Crohn's disease of both small and large intestine with other complication: Secondary | ICD-10-CM | POA: Diagnosis not present

## 2023-11-08 ENCOUNTER — Telehealth: Payer: Self-pay

## 2023-11-08 NOTE — Transitions of Care (Post Inpatient/ED Visit) (Signed)
   11/08/2023  Name: Kim [name] MRN: [id number] DOB: 11-Sep-1940  Today's TOC FU Call Status: Today's TOC FU Call Status:: Unsuccessful Call (1st Attempt) Unsuccessful Call (1st Attempt) Date: 11/08/23  Attempted to reach the patient regarding the most recent Inpatient/ED visit.  Follow Up Plan: Additional outreach attempts will be made to reach the patient to complete the Transitions of Care (Post Inpatient/ED visit) call.   Tonia Frankel RN, CCM [place]  VBCI-Population Health RN Care Manager [phone]

## 2023-11-09 ENCOUNTER — Telehealth: Payer: Self-pay | Admitting: *Deleted

## 2023-11-09 NOTE — Transitions of Care (Post Inpatient/ED Visit) (Signed)
 11/09/2023  Name: Kim [name] MRN: [id number] DOB: [birth year]  Today's TOC FU Call Status: Today's TOC FU Call Status:: Successful TOC FU Call Completed TOC FU Call Complete Date: 11/09/23 Patient's Name and Date of Birth confirmed.  Transition Care Management Follow-up Telephone Call Date of Discharge: 11/07/23 Discharge Facility: Other ([facility]) Name of Other ([facility]) Discharge Facility: [facility] Type of Discharge: Inpatient Admission How have you been since you were released from the hospital?: Better ("I am doing fine; I live at [facility] and have plenty of help if I need it.  The told me at [facility] that I do not need follow up aith any doctors after this hospital visit, so if something comes up and I need Dr. Donnette Gal, I will call her.") Any questions or concerns?: No  Items Reviewed: Did you receive and understand the discharge instructions provided?: Yes (briefly reviewed with patient who verbalizes good understanding of same - outside hospital AVS; patient declined detailed review) Medications obtained,verified, and reconciled?: No (confirmed no new Rx'd medications post-hospital discharge; self-manages medications and denies questions/ concerns around medications today- adamantly declines all aspects of medication review) Medications Not Reviewed Reasons:: Other: (adamantly declines all aspects of medication review) Any new allergies since your discharge?: No Dietary orders reviewed?: Yes Type of Diet Ordered:: "As healthy as I can" Do you have support at home?: Yes People in Home [RPT]: alone (Confirms resides at [facility]: reports "has all the help I need here") Name of Support/Comfort Primary Source: Reports independent in self-care activities; resides alone at [facility]: reports staff assists as/ if needed/ indicated  Medications Reviewed Today: Medications Reviewed Today     Reviewed by Delainee Tramel M, RN (Registered Nurse) on  11/09/23 at [phone]  Med List Status: <None>   Medication Order Taking? Sig Documenting Provider Last Dose Status Informant  Abaloparatide (TYMLOS) 3120 MCG/1.56ML SOPN [id number] No Inject 1.56 mLs into the skin at bedtime. [provider] Taking Active Self  acetaminophen  (TYLENOL ) 500 MG tablet [id number] No Take 1,000 mg by mouth every morning. [provider] Taking Active Self           Med Note Prince Brooking Dec 21, 2022  3:29 PM)    B-D 3CC LUER-LOK SYR 22GX1-1/2 22G X 1-1/2" 3 ML MISC [id number] No USE AS DIRECTED Colene Dauphin, MD Taking Active Self  B-D 3CC LUER-LOK SYR 25GX5/8" 25G X 5/8" 3 ML MISC [id number] No USE AS DIRECTED. Colene Dauphin, MD Taking Active   brimonidine (ALPHAGAN) 0.2 % ophthalmic solution [id number] No Place 1 drop into both eyes 3 (three) times daily. [provider] Taking Active Self           Med Note Prince Brooking Dec 21, 2022  3:35 PM)    Calcium  Citrate-Vitamin D  200-125 MG-UNIT TABS [id number] No Take 1-2 tablets by mouth See admin instructions. Take 2 tab in the morning and 1 tab at bedtime [provider] Taking Active Self           Med Note [place] Pierre   Wed Dec 21, 2022  3:30 PM)    Cholecalciferol  (VITAMIN D3) 50 MCG (2000 UT) TABS [id number] No Take 2,000 Units by mouth daily. [provider] Taking Active Self  cyanocobalamin  (VITAMIN B12) 1000 MCG/ML injection [id number] No INJECT 1 ML IM ONCE A MONTH. Colene Dauphin, MD Taking Active   dorzolamide -timolol  (COSOPT ) 2-0.5 % ophthalmic solution [id number] No Place 1 drop  into both eyes 2 (two) times daily. [provider] Taking Active Self  Fluocinolone Acetonide Body 0.01 % OIL [id number] No Apply topically. [provider] Taking Active   gabapentin  (NEURONTIN ) 300 MG capsule [id number] No Take 600 mg by mouth at bedtime. [provider] Taking Active Self           Med Note Prince Brooking Dec 21, 2022  3:31 PM)    loratadine (CLARITIN) 10 MG tablet [id number] No Take 10 mg by mouth daily. [place] [provider] Taking Active Self  Melatonin 10 MG TBCR [id number] No Take 10 mg by mouth at bedtime. [provider] Taking Active Self           Med Note Prince Brooking Dec 21, 2022  3:32 PM)    Multiple Vitamins-Minerals (ONE-A-DAY WOMENS 50+ ADVANTAGE) TABS [id number] No Take 1 tablet by mouth daily. [provider] Taking Active Self           Med Note [place]   Wed Dec 21, 2022  3:31 PM)    ROCKLATAN 0.02-0.005 % SOLN [id number] No Place 1 drop into both eyes daily. [provider] Taking Active Self  Teriparatide 600 MCG/2.4ML SOPN [id number] No 20mcg Subcutaneous once a day for 30 days [provider] Taking Active   triamcinolone cream (KENALOG) 0.1 % [id number] No Apply 1 application  topically every other day. [provider] Taking Active Self  Med List Note Lawana Pray 12/21/22 1538): Resident at [facility] independent living           Home Care and Equipment/Supplies: Were Home Health Services Ordered?: Yes Name of Home Health Agency:: [facility] Has Agency set up a time to come to your home?: No EMR reviewed for Home Health Orders:  (Patient states she plans to decline services "if and when" home health agency calls: states "I don't need that") Any new equipment or medical supplies ordered?: No  Functional Questionnaire: Do you need assistance with bathing/showering or dressing?: No Do you need assistance with meal preparation?: Yes (Reports [facility] facility prepares all meals) Do you need assistance with eating?: No Do you have difficulty maintaining continence: No Do you need assistance with getting out of bed/getting out of a chair/moving?: No Do you have difficulty managing or taking your medications?: No  Follow up appointments reviewed: PCP Follow-up appointment confirmed?: No  (verified not indicated per hospital discharging provider discharge notes and patient adamantly declines scheduling with my assistance today: states that she is "perfectly capable" of "scheduling myself if I decide I need to") MD Provider Line Number:[phone] Given: No (verified well-established with current PCP) Specialist Hospital Follow-up appointment confirmed?: No (verified not indicated per hospital discharging provider discharge notes - patient states "they told me I don't need to see any doctor after this hospital visit; told me I was better and to keep doing what I am doing") Reason Specialist Follow-Up Not Confirmed: Patient has Specialist Provider Number and will Call for Appointment Do you need transportation to your follow-up appointment?: No Do you understand care options if your condition(s) worsen?: Yes-patient verbalized understanding  SDOH Interventions Today    Flowsheet Row Most Recent Value  SDOH Interventions   Food Insecurity Interventions Intervention Not Indicated  Housing Interventions Intervention Not Indicated  [resides at [facility]]  Transportation Interventions Intervention Not Indicated  [Reports uses "[employer] broadcast assistant" for all transportation: apparently- private pay transportation associated with ILF]  Utilities Interventions Intervention  Not Indicated      Patient declines need for ongoing/ further care management/ coordination outreach; declines enrollment in 30-day TOC program- declines taking my direct phone number should needs/ concerns arise post-TOC call   See TOC assessment tabs for additional assessment/ TOC intervention information  Pls call/ message for questions,  Robynne Roat Mckinney Anona Giovannini, RN, BSN, CCRN [employer] RN Care Manager  Transitions of Care  [facility] - [facility] [phone]: direct office

## 2023-11-20 DIAGNOSIS — K5090 Crohn's disease, unspecified, without complications: Secondary | ICD-10-CM | POA: Diagnosis not present

## 2023-11-20 DIAGNOSIS — C189 Malignant neoplasm of colon, unspecified: Secondary | ICD-10-CM | POA: Diagnosis not present

## 2023-11-20 DIAGNOSIS — K50011 Crohn's disease of small intestine with rectal bleeding: Secondary | ICD-10-CM | POA: Diagnosis not present

## 2023-11-20 DIAGNOSIS — Z932 Ileostomy status: Secondary | ICD-10-CM | POA: Diagnosis not present

## 2023-11-20 DIAGNOSIS — Z933 Colostomy status: Secondary | ICD-10-CM | POA: Diagnosis not present

## 2023-11-24 IMAGING — MR MR ^MR ENTEROGRAPHY W/WO
15 series · 48 of 48 positions shown · IV contrast (7 ML MULTIHANCE)
Comparison: MR enterography, 01/08/2020

CLINICAL DATA: Crohn's disease, status post total colectomy and
ileostomy

EXAM:
MR ABDOMEN AND PELVIS WITHOUT AND WITH CONTRAST (MR ENTEROGRAPHY)
TECHNIQUE: Multiplanar, multisequence MRI of the abdomen and pelvis was
performed both before and during bolus administration of intravenous
contrast. Negative oral contrast VoLumen was given.
CONTRAST:  7mL MULTIHANCE GADOBENATE DIMEGLUMINE 529 MG/ML IV SOLN

[Series 4: T2 · coronal · 6.0mm · 1.56mm/px · 1 of 31 slices shown (1 of 2)]
[im 1/31]
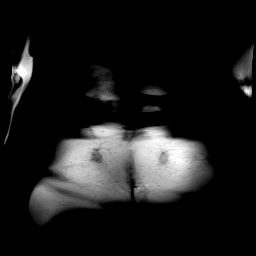

[Series 5: T2 · axial · 6.0mm · 1.22mm/px · 1 of 41 slices shown (2 of 2)]
[im 1/41]
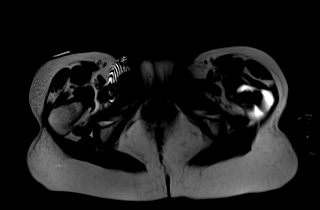

[Series 6: DWI · axial · 5.0mm · 1.42mm/px · z∈[-87,+171]mm · 4 of 132 slices shown (1 of 2)]
[im 1/132]
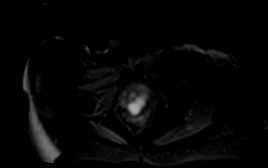
[im 44/132]
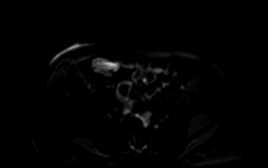
[im 88/132]
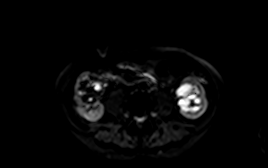
[im 132/132]
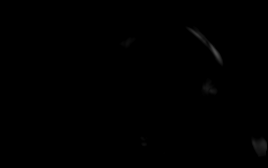

[Series 7: DWI · axial · 5.0mm · 1.42mm/px · 1 of 44 slices shown (2 of 2)]
[im 1/44]
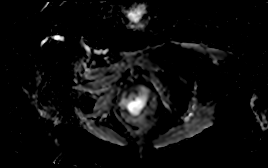

[Series 8: bSSFP · coronal · 5.0mm · 1.25mm/px · 1 of 30 slices shown]
[im 1/30]
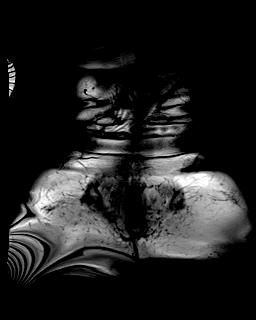

[Series 9: T1 dynamic · axial · non-contrast · 3.5mm · 1.25mm/px · z∈[-110,+223]mm · 4 of 96 slices shown]
[im 1/96]
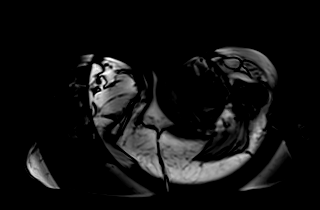
[im 32/96]
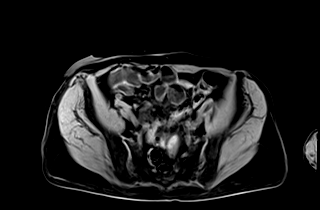
[im 64/96]
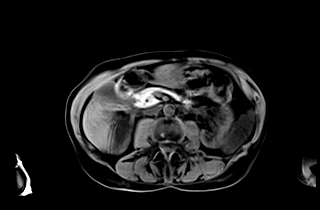
[im 96/96]
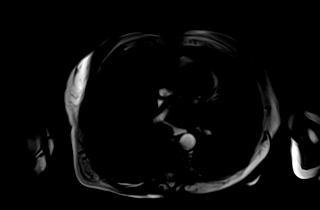

[Series 10: T1 dynamic post-contrast · axial · 3.5mm · 1.25mm/px · z∈[-110,+223]mm · 4 of 96 slices shown (1 of 9)]
[im 1/96]
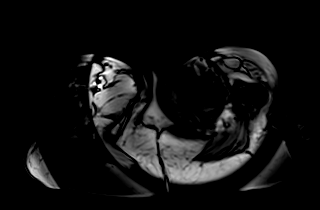
[im 32/96]
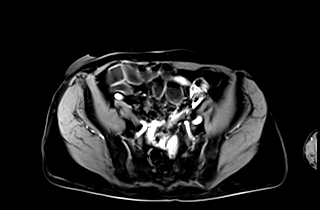
[im 64/96]
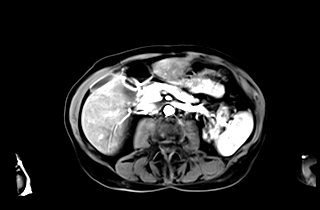
[im 96/96]
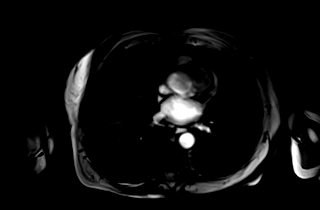

[Series 11: T1 dynamic post-contrast · axial · 3.5mm · 1.25mm/px · z∈[-110,+223]mm · 3 of 87 slices shown (2 of 9)]
[im 1/87]
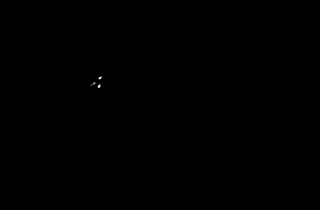
[im 44/87]
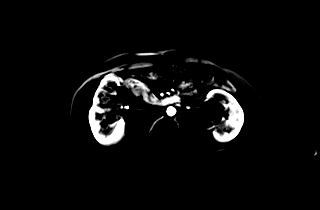
[im 87/87]
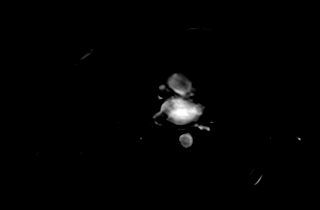

[Series 12: T1 dynamic post-contrast · axial · 3.5mm · 1.25mm/px · z∈[-110,+223]mm · 4 of 96 slices shown (3 of 9)]
[im 1/96]
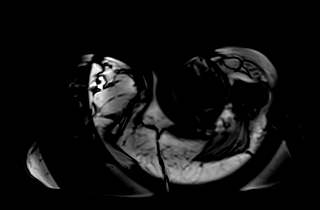
[im 32/96]
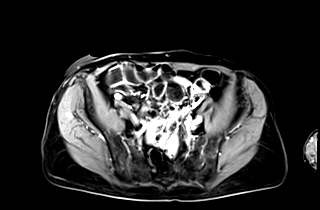
[im 64/96]
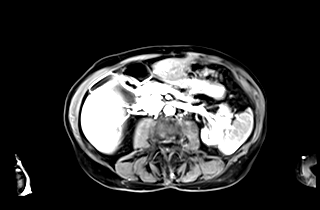
[im 96/96]
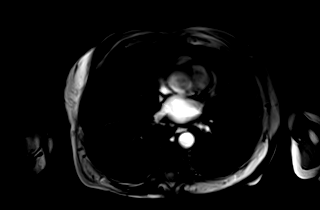

[Series 13: T1 dynamic post-contrast · axial · 3.5mm · 1.25mm/px · z∈[-110,+223]mm · 4 of 95 slices shown (4 of 9)]
[im 1/95]
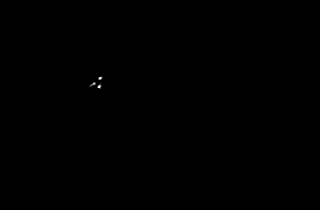
[im 32/95]
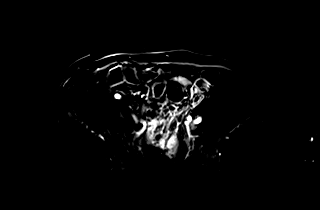
[im 63/95]
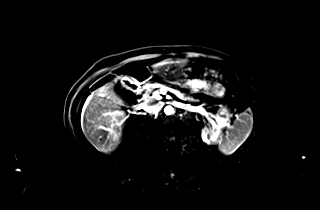
[im 95/95]
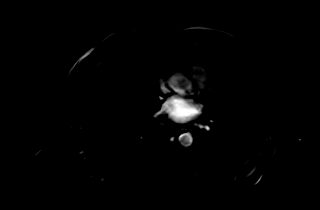

[Series 14: T1 dynamic post-contrast · axial · 3.5mm · 1.25mm/px · z∈[-110,+223]mm · 4 of 96 slices shown (5 of 9)]
[im 1/96]
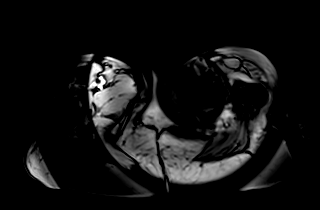
[im 32/96]
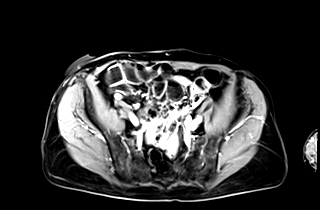
[im 64/96]
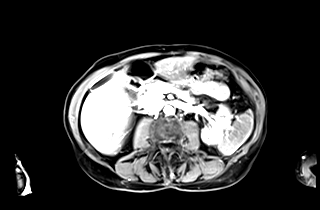
[im 96/96]
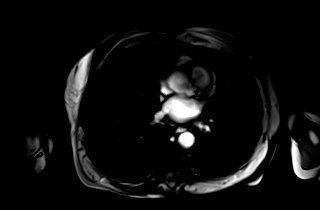

[Series 15: T1 dynamic post-contrast · axial · 3.5mm · 1.25mm/px · z∈[-110,+223]mm · 4 of 95 slices shown (6 of 9)]
[im 1/95]
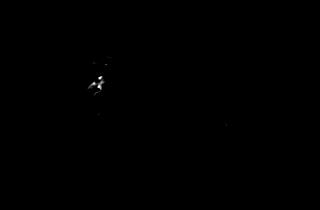
[im 32/95]
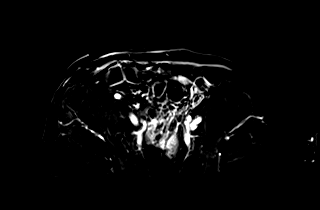
[im 63/95]
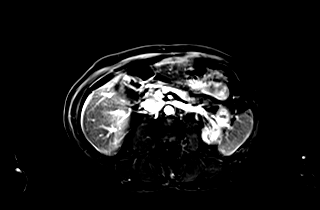
[im 95/95]
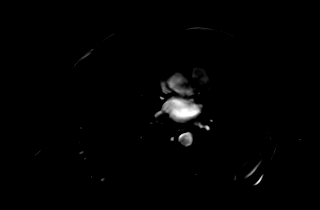

[Series 16: T1 dynamic post-contrast · coronal · 3.0mm · 1.34mm/px · 5 of 128 slices shown (7 of 9)]
[im 1/128]
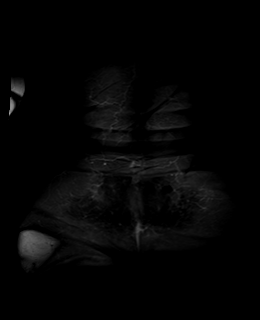
[im 32/128]
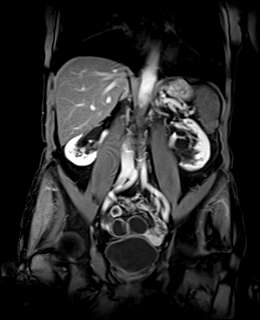
[im 64/128]
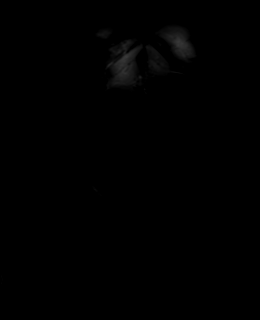
[im 96/128]
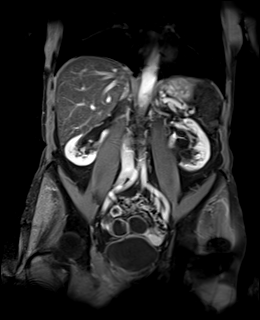
[im 128/128]
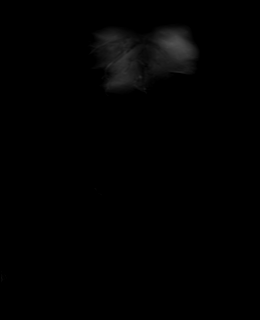

[Series 17: T1 dynamic post-contrast · axial · 3.5mm · 1.25mm/px · z∈[-110,+223]mm · 4 of 96 slices shown (8 of 9)]
[im 1/96]
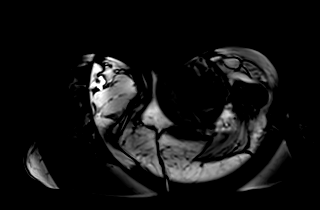
[im 32/96]
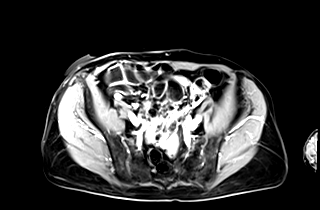
[im 64/96]
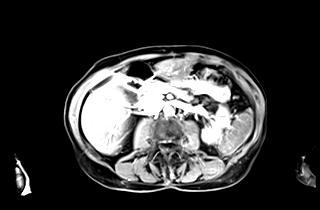
[im 96/96]
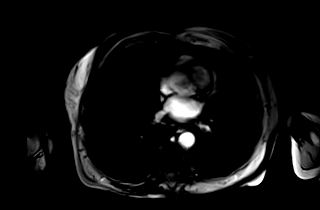

[Series 18: T1 dynamic post-contrast · axial · 3.5mm · 1.25mm/px · z∈[-110,+223]mm · 4 of 96 slices shown (9 of 9)]
[im 1/96]
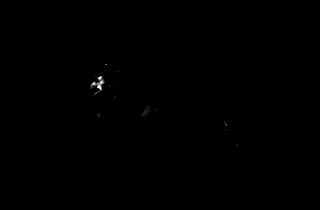
[im 32/96]
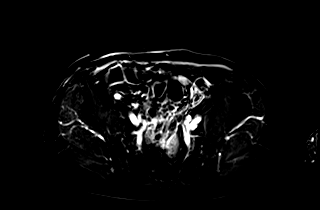
[im 64/96]
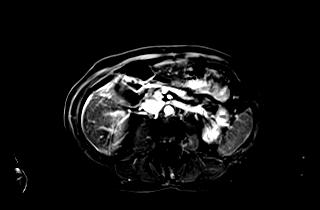
[im 96/96]
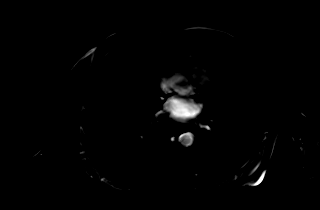

[48 of 48 positions shown; findings below may reference images not displayed]

FINDINGS: COMBINED FINDINGS FOR BOTH MR ABDOMEN AND PELVIS

Lower chest: No acute findings.

Hepatobiliary: No mass or other parenchymal abnormality identified.
No gallstones. No biliary ductal dilatation.

Pancreas: No mass, inflammatory changes, or other parenchymal
abnormality identified.No pancreatic ductal dilatation.

Spleen:  Within normal limits in size and appearance.

Adrenals/Urinary Tract: Normal adrenal glands. Numerous bilateral
parapelvic cysts. No renal masses or suspicious contrast enhancement
identified. No evidence of hydronephrosis.

Stomach/Bowel: Status post total colectomy and right lower quadrant
end ileostomy.

Vascular/Lymphatic: No pathologically enlarged lymph nodes
identified. No abdominal aortic aneurysm demonstrated.

Reproductive: The uterus is normal. Stable, benign bilateral adnexal
cysts, measuring 3.2 cm on the left and 1.6 cm on the right. No
follow up imaging recommended. Note: This recommendation does not
apply to premenarchal patients and to those with increased risk
(genetic, family history, elevated tumor markers or other high-risk
factors) of ovarian cancer. Reference: JACR [DATE]):[id number]

Other:  None.

Musculoskeletal: No suspicious osseous lesions identified.
IMPRESSION: 1. No acute inflammatory findings within the abdomen or pelvis. No
evidence of stricture, obstruction, fistula, or abscess at this
time.
2. Status post total colectomy and right lower quadrant end
ileostomy.
3. Stable, benign bilateral adnexal cysts.

## 2023-11-24 IMAGING — MR MR ENTEROGRAPHY W/ CM
15 series · 48 of 48 positions shown · IV contrast (7 ML MULTIHANCE)
Comparison: MR enterography, 01/08/2020

CLINICAL DATA: Crohn's disease, status post total colectomy and
ileostomy

EXAM:
MR ABDOMEN AND PELVIS WITHOUT AND WITH CONTRAST (MR ENTEROGRAPHY)
TECHNIQUE: Multiplanar, multisequence MRI of the abdomen and pelvis was
performed both before and during bolus administration of intravenous
contrast. Negative oral contrast VoLumen was given.
CONTRAST:  7mL MULTIHANCE GADOBENATE DIMEGLUMINE 529 MG/ML IV SOLN

[Series 3: T2 · coronal · 6.0mm · 1.56mm/px · 1 of 31 slices shown (1 of 2)]
[im 1/31]
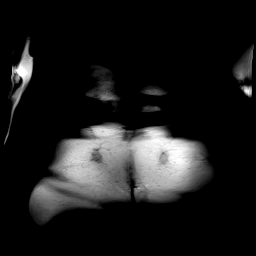

[Series 4: T2 · axial · 6.0mm · 1.22mm/px · 1 of 41 slices shown (2 of 2)]
[im 1/41]
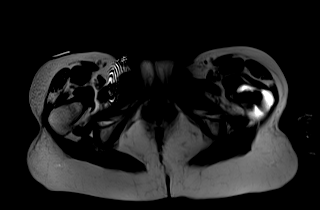

[Series 5: DWI · axial · 5.0mm · 1.42mm/px · z∈[-87,+171]mm · 4 of 132 slices shown (1 of 2)]
[im 1/132]
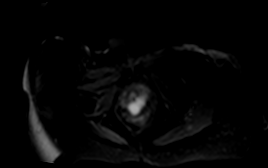
[im 44/132]
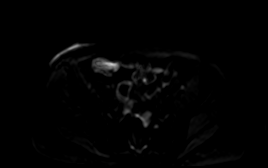
[im 88/132]
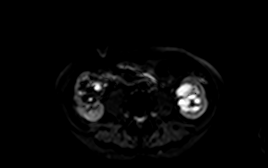
[im 132/132]
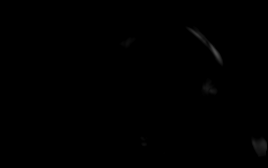

[Series 6: DWI · axial · 5.0mm · 1.42mm/px · 1 of 44 slices shown (2 of 2)]
[im 1/44]
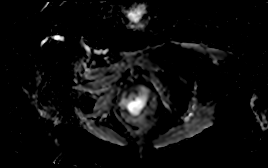

[Series 7: bSSFP · coronal · 5.0mm · 1.25mm/px · 1 of 30 slices shown]
[im 1/30]
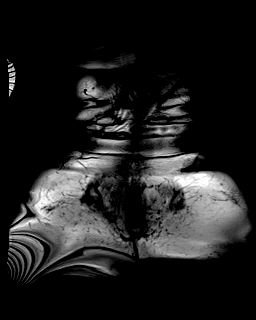

[Series 8: T1 dynamic · axial · non-contrast · 3.5mm · 1.25mm/px · z∈[-110,+223]mm · 4 of 96 slices shown]
[im 1/96]
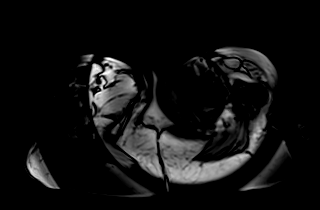
[im 32/96]
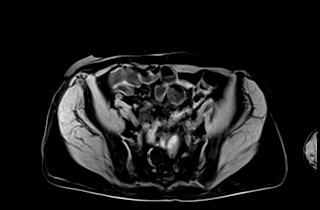
[im 64/96]
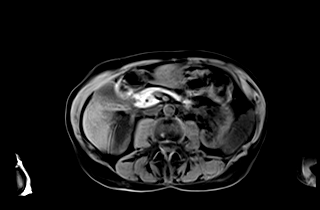
[im 96/96]
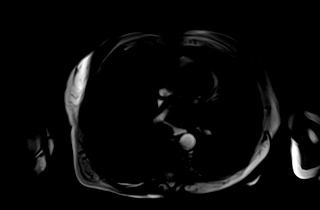

[Series 9: T1 dynamic post-contrast · axial · 3.5mm · 1.25mm/px · z∈[-110,+223]mm · 4 of 96 slices shown (1 of 9)]
[im 1/96]
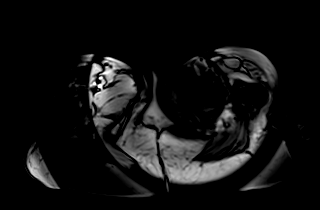
[im 32/96]
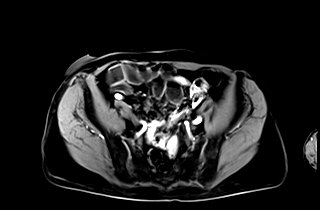
[im 64/96]
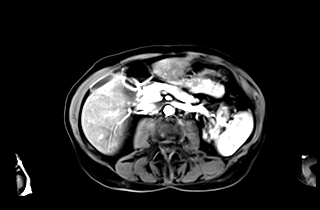
[im 96/96]
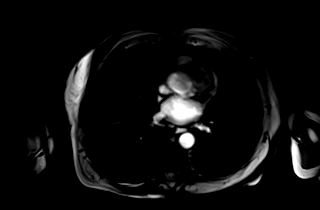

[Series 10: T1 dynamic post-contrast · axial · 3.5mm · 1.25mm/px · z∈[-110,+223]mm · 3 of 87 slices shown (2 of 9)]
[im 1/87]
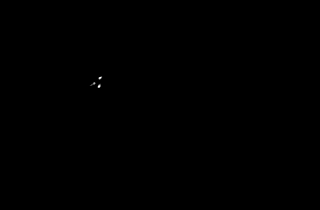
[im 44/87]
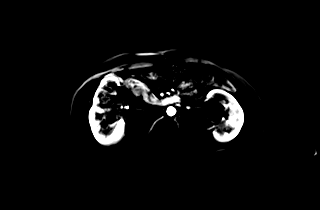
[im 87/87]
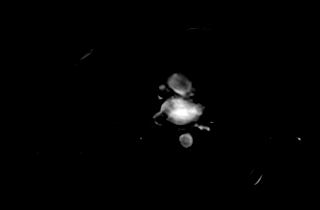

[Series 11: T1 dynamic post-contrast · axial · 3.5mm · 1.25mm/px · z∈[-110,+223]mm · 4 of 96 slices shown (3 of 9)]
[im 1/96]
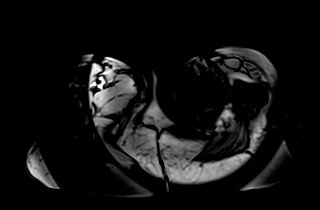
[im 32/96]
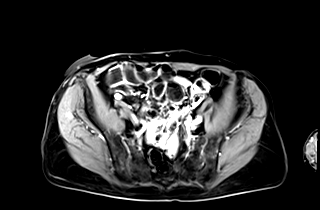
[im 64/96]
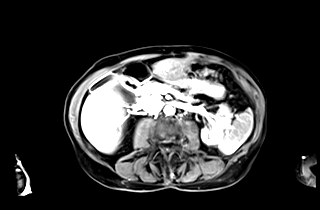
[im 96/96]
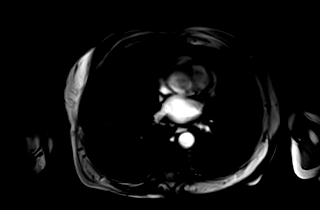

[Series 12: T1 dynamic post-contrast · axial · 3.5mm · 1.25mm/px · z∈[-110,+223]mm · 4 of 95 slices shown (4 of 9)]
[im 1/95]
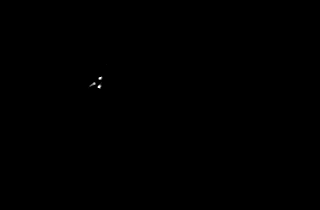
[im 32/95]
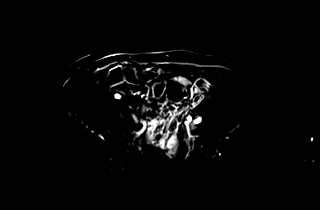
[im 63/95]
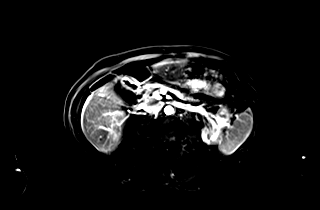
[im 95/95]
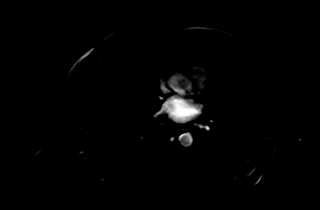

[Series 13: T1 dynamic post-contrast · axial · 3.5mm · 1.25mm/px · z∈[-110,+223]mm · 4 of 96 slices shown (5 of 9)]
[im 1/96]
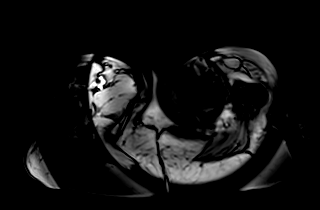
[im 32/96]
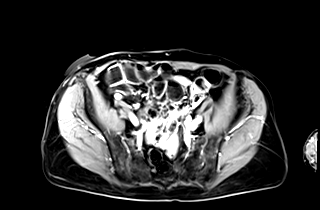
[im 64/96]
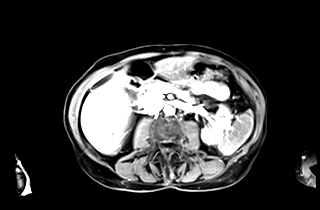
[im 96/96]
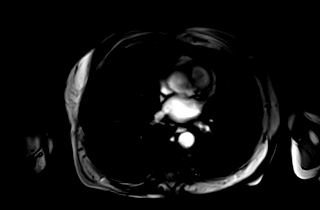

[Series 14: T1 dynamic post-contrast · axial · 3.5mm · 1.25mm/px · z∈[-110,+223]mm · 4 of 95 slices shown (6 of 9)]
[im 1/95]
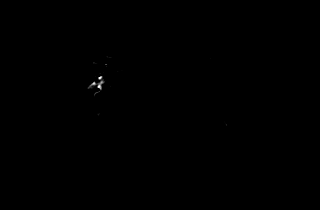
[im 32/95]
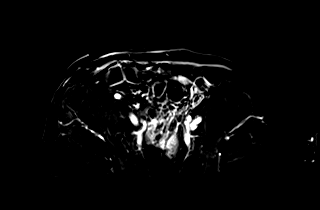
[im 63/95]
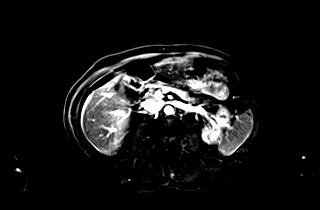
[im 95/95]
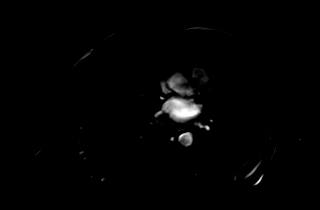

[Series 15: T1 dynamic post-contrast · coronal · 3.0mm · 1.34mm/px · 5 of 128 slices shown (7 of 9)]
[im 1/128]
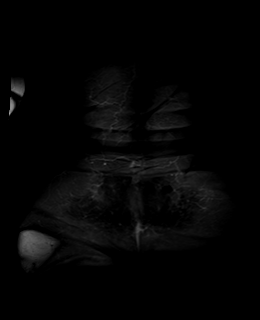
[im 32/128]
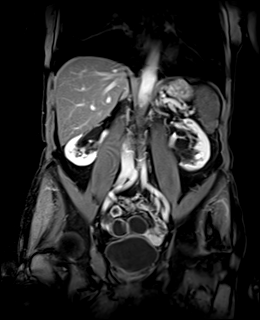
[im 64/128]
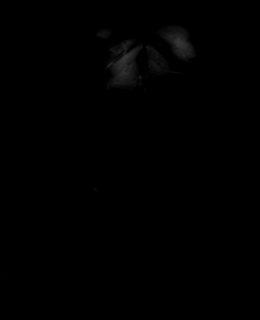
[im 96/128]
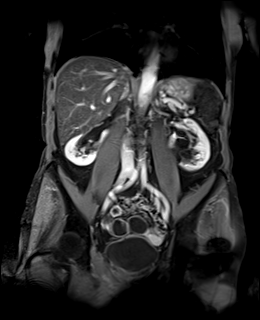
[im 128/128]
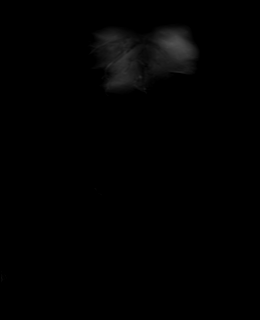

[Series 16: T1 dynamic post-contrast · axial · 3.5mm · 1.25mm/px · z∈[-110,+223]mm · 4 of 96 slices shown (8 of 9)]
[im 1/96]
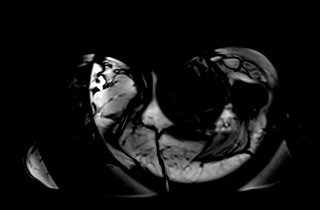
[im 32/96]
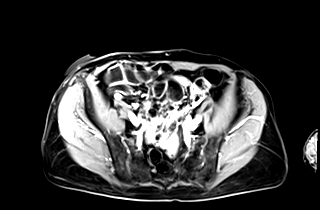
[im 64/96]
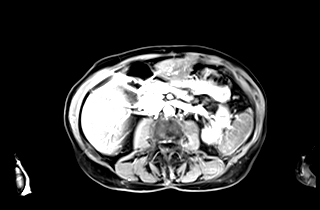
[im 96/96]
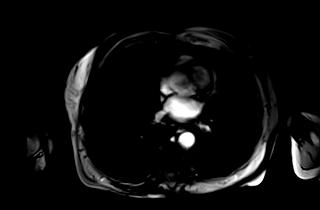

[Series 17: T1 dynamic post-contrast · axial · 3.5mm · 1.25mm/px · z∈[-110,+223]mm · 4 of 96 slices shown (9 of 9)]
[im 1/96]
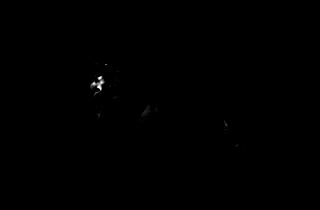
[im 32/96]
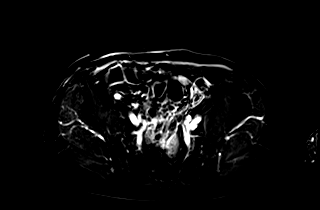
[im 64/96]
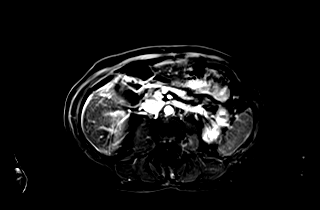
[im 96/96]
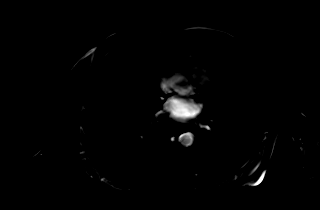

[48 of 48 positions shown; findings below may reference images not displayed]

FINDINGS: COMBINED FINDINGS FOR BOTH MR ABDOMEN AND PELVIS

Lower chest: No acute findings.

Hepatobiliary: No mass or other parenchymal abnormality identified.
No gallstones. No biliary ductal dilatation.

Pancreas: No mass, inflammatory changes, or other parenchymal
abnormality identified.No pancreatic ductal dilatation.

Spleen:  Within normal limits in size and appearance.

Adrenals/Urinary Tract: Normal adrenal glands. Numerous bilateral
parapelvic cysts. No renal masses or suspicious contrast enhancement
identified. No evidence of hydronephrosis.

Stomach/Bowel: Status post total colectomy and right lower quadrant
end ileostomy.

Vascular/Lymphatic: No pathologically enlarged lymph nodes
identified. No abdominal aortic aneurysm demonstrated.

Reproductive: The uterus is normal. Stable, benign bilateral adnexal
cysts, measuring 3.2 cm on the left and 1.6 cm on the right. No
follow up imaging recommended. Note: This recommendation does not
apply to premenarchal patients and to those with increased risk
(genetic, family history, elevated tumor markers or other high-risk
factors) of ovarian cancer. Reference: JACR [DATE]):[id number]

Other:  None.

Musculoskeletal: No suspicious osseous lesions identified.
IMPRESSION: 1. No acute inflammatory findings within the abdomen or pelvis. No
evidence of stricture, obstruction, fistula, or abscess at this
time.
2. Status post total colectomy and right lower quadrant end
ileostomy.
3. Stable, benign bilateral adnexal cysts.

## 2023-12-13 ENCOUNTER — Encounter: Payer: Self-pay | Admitting: Internal Medicine

## 2023-12-20 ENCOUNTER — Inpatient Hospital Stay: Payer: Medicare PPO

## 2023-12-20 ENCOUNTER — Inpatient Hospital Stay: Payer: Medicare PPO | Admitting: Internal Medicine

## 2023-12-20 ENCOUNTER — Telehealth: Payer: Self-pay | Admitting: Internal Medicine

## 2023-12-20 NOTE — Telephone Encounter (Signed)
 The patient left a voicemail to cancel today's appointments due to her being sick. She said she will call back to reschedule at a later time.

## 2023-12-21 DIAGNOSIS — Z881 Allergy status to other antibiotic agents status: Secondary | ICD-10-CM | POA: Diagnosis not present

## 2023-12-21 DIAGNOSIS — H409 Unspecified glaucoma: Secondary | ICD-10-CM | POA: Diagnosis not present

## 2023-12-21 DIAGNOSIS — A319 Mycobacterial infection, unspecified: Secondary | ICD-10-CM | POA: Diagnosis not present

## 2023-12-21 DIAGNOSIS — Z87891 Personal history of nicotine dependence: Secondary | ICD-10-CM | POA: Diagnosis not present

## 2023-12-21 DIAGNOSIS — N183 Chronic kidney disease, stage 3 unspecified: Secondary | ICD-10-CM | POA: Diagnosis not present

## 2023-12-21 DIAGNOSIS — E860 Dehydration: Secondary | ICD-10-CM | POA: Diagnosis not present

## 2023-12-21 DIAGNOSIS — K50919 Crohn's disease, unspecified, with unspecified complications: Secondary | ICD-10-CM | POA: Diagnosis not present

## 2023-12-21 DIAGNOSIS — R051 Acute cough: Secondary | ICD-10-CM | POA: Diagnosis not present

## 2023-12-21 DIAGNOSIS — K50918 Crohn's disease, unspecified, with other complication: Secondary | ICD-10-CM | POA: Diagnosis not present

## 2023-12-21 DIAGNOSIS — R630 Anorexia: Secondary | ICD-10-CM | POA: Diagnosis not present

## 2023-12-21 DIAGNOSIS — E871 Hypo-osmolality and hyponatremia: Secondary | ICD-10-CM | POA: Diagnosis not present

## 2023-12-21 DIAGNOSIS — D631 Anemia in chronic kidney disease: Secondary | ICD-10-CM | POA: Diagnosis not present

## 2023-12-21 DIAGNOSIS — R627 Adult failure to thrive: Secondary | ICD-10-CM | POA: Diagnosis not present

## 2023-12-21 NOTE — H&P (Signed)
 Hospital Medicine Admission History & Physical  Time of Service: 12/21/2023, 11:10 PM  PCP: Geofm Glade PARAS, MD, Phone [phone], Fax [phone]   Chief Complaint  Dehydration  History of Present Illness  Kim [name] is a 83 y.o. female with PMH of Crohn's disease status post remote colectomy with permanent end ileostomy, CKD 3, osteoporosis, Mycobacterium abscessus pulmonary infection, underweight (BMI 17), glaucoma, anemia of chronic disease who presents with 3 days of poor p.o. intake.  She was in her usual state of health until 3-4 days ago, when she lost her appetite.  Since then, she has not had much to eat or drink.  Her son came to visit her and noted that she looked weak and was barely able to hold a conversation.  He notes that when he spoke with her over the phone 5 days ago, she was at baseline.  He feels that at the time of my interview, she has returned to baseline, which she attributes to her receiving IV fluids in the ED. She feels like her anorexia is beginning to improve.  She ate part of a ham sandwich in the ED and has been drinking water and states I am on the upswing.  She has had no abdominal pain, nausea, vomiting.  She states that her ostomy output was normal until this started, and then decreased.  She has not had any blood in her ostomy output. She has had no fevers, chills, sweats. She has had no pulmonary or urinary infectious symptoms.  Chart review shows that she has lost approximately 10 pounds in the past 2 years, although she was at her current weight 3 years ago.  She is very careful about what she eats.  She is rigorous about avoiding fats and oils.  She states I think to maintain myself, I need 1000 cal. he states that at baseline she is lucky if she can drink 30 ounces of fluid in a day.   She also states I'D be happy to be 72 pounds, but later says she would also be okay gaining a few.  She states that she fears gaining weight because her mother was  overweight.  Chart review shows that the patient was diagnosed last year with Mycobacterium abscessus lung infection.  She and her pulmonologist elected to defer treatment.  When asked about dying in the context of eliciting code status, she says I think about holding my breath and making it happen myself sometimes.  On further questioning, she endorses intermittent passive SI since the passing of her husband 2 years ago.  She has never made a plan, has never attempted self-harm, and does not have SI at this time.  Tobacco: 15 pack years. Quit 40 years ago.  Alcohol: none Drugs: none  On arrival to the ED, vital signs were T98.1 F, HR 74, BP 121/54, RR 18, SpO2 100% on room air.  Initial labs demonstrated WBC 7.6, Hgb 8.1, NA 130, K4.0, creatinine 2.2 (baseline 1.8-2.0), BUN 46, bicarb 19, AST 50, ALT 38, total bilirubin 1.1, albumin 2.8, anion gap 13.  CRP was 24.4 (from 9.3 1 month ago).  ESR 66 (from 72 1 month ago).  In the ED, the patient received 1 L IVF.  She was admitted to medicine for further evaluation and management.   Medical History  Past Medical History Past Medical History:  Diagnosis Date  . Anemia of chronic disease   . Basal cell carcinoma 06/06/1998   x 2  . Chronic kidney disease (CKD),  active medical management without dialysis, stage 3 (moderate) (CMS/HHS-HCC)   . Crohn's disease (CMS/HHS-HCC)   . Glaucoma   . Iron deficiency anemia   . Osteoporosis   . S/P ileostomy (CMS-HCC) 10/17/2011    Past Surgical History Past Surgical History:  Procedure Laterality Date  . total proctocolectomy  1984  . BASAL CELL CARCINOMA EXCISION  2000   x 2  . EXCISION ILEOANAL RESERVOIR W/ILEOSTOMY N/A 02/02/2016   Procedure: Open EXCISION OF Amedeo pouch with end ileostomy creation, ERAS, epidural;  Surgeon: Mliss Shawnee Isom Frederik, MD;  [facility];  Service: General Surgery;  Laterality: N/A;  . RESECTION SMALL BOWEL N/A 02/02/2016   Procedure: ENTERECTOMY,  RESECTION OF SMALL INTESTINE; SINGLE RESECTION AND ANASTOMOSIS;  Surgeon: Mliss Shawnee Isom Frederik, MD;  Location: [facility] OR;  Service: General Surgery;  Laterality: N/A;  . CONTINENT ILEOSTOMY  1992, 2013   Revision (enlarged strictured stoma) in December 2013.    Family History Family History  Problem Relation Name Age of Onset  . Diabetes Mother 86   . Heart disease Mother 48   . Heart disease Father 70   . Colon cancer Neg Hx    . Crohn's disease Neg Hx    . Ulcerative colitis Neg Hx      Social History Social History   Socioeconomic History  . Marital status: Married  Tobacco Use  . Smoking status: Former    Current packs/day: 0.00    Types: Cigarettes    Quit date: 06/13/1983    Years since quitting: 40.5  . Smokeless tobacco: Never  Vaping Use  . Vaping status: Never Used  Substance and Sexual Activity  . Alcohol use: No  . Drug use: No   Social Drivers of Corporate investment banker Strain: Low Risk  (11/05/2023)   Overall Financial Resource Strain (CARDIA)   . Difficulty of Paying Living Expenses: Not hard at all  Food Insecurity: No Food Insecurity (11/09/2023)   Received from [facility]   Hunger Vital Sign   . Within the past 12 months, you worried that your food would run out before you got the money to buy more.: Never true   . Within the past 12 months, the food you bought just didn't last and you didn't have money to get more.: Never true  Transportation Needs: No Transportation Needs (11/09/2023)   Received from [facility] - Transportation   . Lack of Transportation (Medical): No   . Lack of Transportation (Non-Medical): No    Allergies & Medications  Allergies Allergies  Allergen Reactions  . Chlorpheniramine Other (See Comments)    Dry  Mouth, dry eyes-so dry she cannot even talk  . Metronidazole Other (See Comments)    Other reaction(s): Other (See Comments) Peripheral neuropathy Peripheral neuropathy  . Other Other (See  Comments)    Artificial sweetner- causes gas, cramping  . Adhesive Tape-Silicones Other (See Comments)    SKIN WILL TEAR WITH ANYTHING OTHER THAN PAPER TAPE OR COBAN WRAP  . Antihistamine [Diphenhydramine -Tripelen-Menth] Other (See Comments)    Dry  Mouth, dry eyes-so dry she cannot even talk- hydroxyzine  . Sulfa (Sulfonamide Antibiotics) Unknown    Medications Prior to Admission Medications  Prescriptions Last Dose Taking?  BD LUER-LOK SYRINGE 3 mL 25 x 5/8 Syrg  No  MULTIVITAMIN ORAL 12/21/2023 Morning Yes  Sig: Take 1 tablet by mouth once daily.  TYMLOS 80 mcg (3,120 mcg/1.56 mL) PnIj 12/20/2023 Morning Yes  Sig: Inject 1.56 mLs subcutaneously once daily  acetaminophen  (TYLENOL ) 500 mg capsule 12/21/2023 Morning Yes  Sig: 500 mg in morning, 500 mg mid-day, 1000 mg at bedtime  brimonidine (ALPHAGAN) 0.2 % ophthalmic solution 12/21/2023 Morning Yes  Sig: INSTILL 1 DROP INTO BOTH EYES TWICE A DAY  calcium  citrate-vitamin D3 200 mg-3.125 mcg (125 unit) Tab 12/21/2023 Morning Yes  Sig: Take 1-2 tablets by mouth  cholecalciferol  (VITAMIN D3) 1,000 unit tablet 12/21/2023 Morning Yes  Sig: Take 2,000 Units by mouth once daily.  cyanocobalamin  (VITAMIN B12) 1,000 mcg/mL injection 12/21/2023 Morning Yes  Sig: Inject into the muscle monthly.  dorzolamide -timolol  (COSOPT ) 2-0.5 % ophthalmic solution 12/21/2023 Morning Yes  Sig: Place 1 drop into both eyes 2 (two) times daily.    gabapentin  (NEURONTIN ) 300 MG capsule 12/21/2023 Morning Yes  Sig: Take 300 mg by mouth 3 (three) times daily 300 mg in morning and 300 mg at night  loperamide (IMODIUM A-D) 2 mg tablet 12/21/2023 Evening Yes  Sig: Take 1 tablet by mouth up to 4 times daily as needed for diarrhea.  loratadine (CLARITIN) 10 mg tablet 12/21/2023 Evening Yes  Sig: Take 10 mg by mouth  melatonin 10 mg TbMP 12/21/2023 Evening Yes  Sig: Take 10 mg by mouth nightly as needed.    netarsudiL-latanoprost  (ROCKLATAN) 0.02-0.005 % ophthalmic solution  12/21/2023 Evening Yes  Sig: Place 1 drop into both eyes at bedtime  syringe with needle 3 mL 22 x 1 1/2 Syrg  No  Sig: as directed  triamcinolone 0.1 % cream 12/21/2023 Morning Yes  Sig: Apply topically    Facility-Administered Medications: None     Review of Systems  A complete review of systems was performed and is negative except as reviewed in the HPI.  Physical Exam    Current Vital Signs 24h Vital Sign Ranges  T 36.9 C (98.5 F) (12/21/23 2031) Temp  Avg: 36.8 C (98.3 F)  Min: 36.7 C (98.1 F)  Max: 36.9 C (98.5 F)  BP 128/40 (12/21/23 2031) BP  Min: 121/54  Max: 129/44  HR 74 (12/21/23 1339) Pulse  Avg: 74  Min: 74  Max: 74  RR 16 (12/21/23 1838) Resp  Avg: 17  Min: 16  Max: 18  O2sat 97 %   SpO2  Avg: 98.5 %  Min: 97 %  Max: 100 %  Weight     There is no height or weight on file to calculate BMI. General: alert, cooperative, cachectic, in NAD Eyes: conjunctiva clear, anicteric sclera HENT: oropharynx clear, moist mucous membranes Neck: no JVD and supple, symmetrical, trachea midline CV: regular rate and rhythm, without murmurs, rubs or gallops Resp: good air exchange, velcro crackles in the lower lung fields bilaterally Abd: soft, nontender, nondistended, normoactive bowel sounds , no erythema or skin breakdown around ostomy site Rectal: deferred Ext: no lower extremity edema Skin: no rashes or lesions Psych: oriented to time, place and person, mood and affect are appropriate Neuro: Grossly normal  Data   Recent Results (from the past 24 hours)  Comprehensive Metabolic Panel (CMP)   Collection Time: 12/21/23  3:53 PM  Result Value Ref Range   Sodium 130 (L) 135 - 145 mmol/L   Potassium 4.0 3.5 - 5.0 mmol/L   Chloride 98 98 - 108 mmol/L   Carbon Dioxide (CO2) 19 (L) 21 - 30 mmol/L   Urea Nitrogen (BUN) 46 (H) 7 - 20 mg/dL   Creatinine 2.2 (H) 0.4 - 1.0 mg/dL   Glucose 85 70 -  140 mg/dL   Calcium  8.5 (L) 8.7 - 10.2 mg/dL   AST (Aspartate Aminotransferase)  50 (H) 15 - 41 U/L   ALT (Alanine Aminotransferase) 38 10 - 39 U/L   Bilirubin, Total 1.1 0.4 - 1.5 mg/dL   Alk Phos (Alkaline Phosphatase) 49 24 - 110 U/L   Albumin 2.8 (L) 3.5 - 4.8 g/dL   Protein, Total 6.8 6.2 - 8.1 g/dL   Anion Gap 13 (H) 3 - 12 mmol/L   BUN/CREA Ratio 21 6 - 27   Glomerular Filtration Rate (eGFR)  22 mL/min/1.73sq m  Magnesium    Collection Time: 12/21/23  3:53 PM  Result Value Ref Range   Magnesium  1.8 1.8 - 2.5 mg/dL  Lipase   Collection Time: 12/21/23  3:53 PM  Result Value Ref Range   Lipase 38 17 - 51 U/L  Complete Blood Count (CBC) with Differential   Collection Time: 12/21/23  3:53 PM  Result Value Ref Range   WBC (White Blood Cell Count) 7.6 3.2 - 9.8 x10^9/L   Hemoglobin 8.1 (L) 11.7 - 15.5 g/dL   Hematocrit 73.9 (L) 64.9 - 45.0 %   Platelets     MCV (Mean Corpuscular Volume) 98 80 - 98 fL   MCH (Mean Corpuscular Hemoglobin) 30.5 26.5 - 34.0 pg   MCHC (Mean Corpuscular Hemoglobin Concentration) 31.2 31.0 - 36.0 %   RBC (Red Blood Cell Count) 2.66 (L) 3.77 - 5.16 x10^12/L   RDW-CV (Red Cell Distribution Width) 13.2 11.5 - 14.5 %   NRBC (Nucleated Red Blood Cell Count) 0.00 0 x10^9/L   NRBC % (Nucleated Red Blood Cell %) 0.0 %   MPV (Mean Platelet Volume) 10.4 7.2 - 11.7 fL   Neutrophil Count 6.3 2.0 - 8.6 x10^9/L   Neutrophil % 83.6 (H) 37 - 80 %   Lymphocyte Count 0.7 0.6 - 4.2 x10^9/L   Lymphocyte % 8.9 (L) 10 - 50 %   Monocyte Count 0.5 0 - 0.9 x10^9/L   Monocyte % 6.1 0 - 12 %   Eosinophil Count 0.04 0 - 0.70 x10^9/L   Eosinophil % 0.5 0 - 7 %   Basophil Count 0.01 0 - 0.20 x10^9/L   Basophil % 0.1 0 - 2 %   Immature Granulocyte Count 0.06 <=0.06 x10^9/L   Immature Granulocyte % 0.8 (H) <=0.7 %   Immature Platelet Fraction    C-Reactive Protein (CRP), Inflammatory   Collection Time: 12/21/23  3:53 PM  Result Value Ref Range   CRP (C-reactive Protein, Inflammatory) 24.41 (H) <=0.85 mg/dL  Sedimentation Rate-Automated   Collection  Time: 12/21/23  3:53 PM  Result Value Ref Range   Sedimentation Rate-Automated 66 (H) <30 mm/hr    EKG: pending  Radiology Studies on Admission: No results found.  Assessment & Plan  Kim [name] is a 83 y.o. female admitted for the following problems: Active Problems:   * No active hospital problems. *   # Anorexia, improving # Failure to thrive # Underweight (BMI 17.5) # Hyponatremia, mild, asymptomatic Patient presents with a few days of poor p.o. intake secondary to anorexia after having been found by her son to be weak and confused.  Labs are consistent with mild hypovolemic hyponatremia and prerenal AKI, supporting an overall picture of dehydration.  Fortunately, she is much improved after receiving IV fluids in the ED.  The etiology of her anorexia is unclear.  I do worry that there is a mental health component to her anorexia and chronic underweight.  She  is rather fixated on her diet and fitness and admits to me that she is scared of becoming overweight, raising concern for disordered eating.  I also worry that there may be an element of depression at play, which seems to be in the context of the loss of her husband in the past couple of years.  1 final contributor to her underweight status is her Mycobacterium abscessus infection.  Her inflammatory markers are elevated compared to her admission last month.  She does not have any clinical evidence of active Crohn's disease or an acute infection, which makes me suspicious that we are seeing an inflammatory response to worsening M abscessus infection. - s/p 1L IVF (30cc/kg) - nutrition consult - MVI, D3 supplement - Continue to offer MH resources - Trend BMP, mag, phos  # AKI on CKD3 Patient presents with creatinine 2.2 from 1.8.  This clinical context, this is very likely a prerenal kidney injury.  Now that she has been fluid resuscitated and is taking better p.o., will trend BMP.  Given her low muscle mass, it may be worth  checking a Cystatin C in the outpatient setting to corroborate her EGFR.  # Glaucoma - Continue home brimonidine, dorzolamide  timolol , latanoprost .  Netarsudil not available on formulary.  # Neuropathic pain Dose reduce gabapentin  to 300 mg twice daily (patient takes 3 times daily at home).  # C/f depression # Passive SI As above, patient has symptoms concerning for depression as well as admitted passive SI.  She told me that she would not be interested in receiving mental health care, although we will continue to build rapport and offer resources as necessary.    # Mycobacterium abscessus infection Follows with Lamar Chris at [facility].  Last imaging in 05/2023.  Will need follow-up appointment to evaluate for contribution of progressing abscessus infection to her FTT.  Comorbid Conditions: Nutritional Disorders:    Hypoalbuminemia:    Hypoalbuminemia is associated with increased risk for patients.  We will attempt to treat the underlying condition(s) contributing to this low albumin state. Electrolyte Disorders:    Hyponatremia:  Hyponatremia present with lowest sodium of 133.  Will continue to monitor.     Hypomagnesemia:  Hypomagnesemia present with lowest magnesium  of 1.7.  Magnesium  supplement administered.  Will continue to monitor.   Hematologic Disorders:    Anemia:  Anemia present with lowest hemoglobin of 8.8.  Will continue to monitor.     Thrombocytopenia:  Thrombocytopenia present with lowest platelet count of 132.  Will continue to monitor and assess for bleeding complications.          VTE Prophylaxis: unfractionated heparin   Code Status: Prior  Patient Class & Status: Inpatient, Intermediate  Discharge Planning: Anticipate DC home    STEPHEN JONATHAN LANDY, MD  [facility] 12/21/2023 11:10 PM   Effective November 05, 2022, [facility], [facility], and/or [facility] refer to the [facility] campus of [facility].    Attestation Statement:   This service was rendered under my overall direction and control, and I was immediately available via phone/pager or present on site.  Able to see patient on 7/18. Please see discharge summary for further details, but in short, improvement in nausea and poor PO intake with return to baseline after IV fluids. No evidence of crohns flare. Performed CT chest while inpatient for continued monitoring of Mycobacterium (managed by outside pulmonology) but without hypoxia or progressive dysnpea symptoms  ALEXANDRA TRENDA PIH, MD

## 2023-12-22 DIAGNOSIS — A319 Mycobacterial infection, unspecified: Secondary | ICD-10-CM | POA: Diagnosis not present

## 2023-12-22 DIAGNOSIS — E860 Dehydration: Secondary | ICD-10-CM | POA: Diagnosis not present

## 2023-12-22 DIAGNOSIS — R911 Solitary pulmonary nodule: Secondary | ICD-10-CM | POA: Diagnosis not present

## 2023-12-22 DIAGNOSIS — R051 Acute cough: Secondary | ICD-10-CM | POA: Diagnosis not present

## 2023-12-22 DIAGNOSIS — K50919 Crohn's disease, unspecified, with unspecified complications: Secondary | ICD-10-CM | POA: Diagnosis not present

## 2023-12-22 NOTE — Discharge Summary (Signed)
 [facility] Medicine Discharge Summary  Admit Date: 12/21/2023 Discharge Date: 12/22/2023  Admitting Physician: Lorane Pih MD Discharge Physician: Lorane Pih MD  Primary Care Provider: Geofm Glade PARAS, MD, Phone [phone]  Discharge Destination: Home  Admission Diagnoses:  Dehydration AKI on CKD3  Discharge Diagnoses:  Principal Problem (Resolved):   Dehydration Active Problems:   CKD, stage 3   Crohn's disease   Malnutrition of moderate degree [name]: 60% to less than 75% of standard weight) (CMS/HHS-HCC)   Mycobacterium abscessus infection  Primary Diagnosis: Admitted for AKI on CKD3 secondary to dehydration  Changes Made (with rationale):  None  To-Do List (incidental findings, follow-up studies, etc.): Known history of mycobacterium abscessus infection, followed by OSH pulmonology. Pt was due for repeat CT scan to re-evaluate, which was completed inpatient again showing extensive heterogeneous reticular nodular infiltrates. Patient will need to follow-up with her OP pulmonologist for further guidance.   Anticipatory Guidance for Outpatient Care:  Concerns for depression and anorexia, which may be contributing to her failure to thrive. She was not interested in mental health support at this time but should continue to offer at each visit.  Patient has known mycobacterium abscessus infection that requires close follow-up by pulmonology.     Results Pending at Discharge:  None Please see phone numbers at end of this summary for lab contact information.   Follow-up/Care Transition Plan: Sched. appts: Future Appointments  Date Time Provider Department Center  01/11/2024 12:30 PM Dudley Slater Norris, MD [facility] [place]    Follow-up info: Geofm Glade PARAS, MD [address] [place] [address] [phone]  Schedule an appointment as soon as possible for a visit in 1 week(s)   Geofm Glade PARAS, MD [address] [place] [address] [phone]         Allergies/Intolerances:  Allergies  Allergen Reactions  . Chlorpheniramine Other (See Comments)    Dry  Mouth, dry eyes-so dry she cannot even talk  . Metronidazole Other (See Comments)    Other reaction(s): Other (See Comments) Peripheral neuropathy Peripheral neuropathy  . Other Other (See Comments)    Artificial sweetner- causes gas, cramping  . Adhesive Tape-Silicones Other (See Comments)    SKIN WILL TEAR WITH ANYTHING OTHER THAN PAPER TAPE OR COBAN WRAP  . Antihistamine [Diphenhydramine -Tripelen-Menth] Other (See Comments)    Dry  Mouth, dry eyes-so dry she cannot even talk- hydroxyzine  . Sulfa (Sulfonamide Antibiotics) Unknown     New Adverse Drug Events: none  Medications:     Current Discharge Medication List     CONTINUE taking these medications      Instructions  acetaminophen  500 mg capsule Quantity: 30 capsule Refills: 0  Commonly known as: TYLENOL  500 mg in morning, 500 mg mid-day, 1000 mg at bedtime   BD LUER-LOK SYRINGE 3 mL 25 x 5/8 Syrg Refills: 0 Generic drug: syringe with needle    syringe with needle 3 mL 22 x 1 1/2 Syrg Refills: 0  as directed   brimonidine 0.2 % ophthalmic solution Refills: 1  Commonly known as: ALPHAGAN INSTILL 1 DROP INTO BOTH EYES TWICE A DAY Last time this was given: 1 drop on December 22, 2023  9:37 AM   calcium  citrate-vitamin D3 200 mg-3.125 mcg (125 unit) Tab Refills: 0  Take 1-2 tablets by mouth   cholecalciferol  1000 unit tablet Refills: 0  Commonly known as: VITAMIN D3 Take 2,000 Units by mouth once daily. Last time this was given: 2,000 Units on  December 22, 2023 11:25 AM   cyanocobalamin  1,000 mcg/mL injection Refills: 0  Commonly known as: VITAMIN B12 Inject into the muscle monthly.   dorzolamide -timoloL  22.3-6.8 mg/mL ophthalmic solution Refills: 0  Commonly known as: COSOPT  Place 1 drop into both eyes 2 (two) times daily.  Last time this was given:  1 drop on December 22, 2023 10:08 AM   gabapentin  300 MG capsule Refills: 0  Commonly known as: NEURONTIN  Take 300 mg by mouth 3 (three) times daily 300 mg in morning and 300 mg at night Last time this was given: 300 mg on December 22, 2023 11:25 AM   loperamide 2 mg tablet Quantity: 90 tablet Refills: 1  Commonly known as: IMODIUM A-D Take 1 tablet by mouth up to 4 times daily as needed for diarrhea. Last time this was given: Ask your nurse or doctor   loratadine 10 mg tablet Refills: 0  Commonly known as: CLARITIN Take 10 mg by mouth   melatonin 10 mg Tbmp Refills: 0  Take 10 mg by mouth nightly as needed.    MULTIVITAMIN ORAL Refills: 0  Take 1 tablet by mouth once daily.   ROCKLATAN 0.02-0.005 % ophthalmic solution Refills: 0 Generic drug: netarsudiL-latanoprost   Place 1 drop into both eyes at bedtime   triamcinolone 0.1 % cream Refills: 0  Apply topically   TYMLOS 80 mcg (3,120 mcg/1.56 mL) Pnij Refills: 0 Generic drug: abaloparatide  Inject 1.56 mLs subcutaneously once daily         Brief History of Present Illness:  Kim [name] is a 83 y.o. female with PMH of Crohn's disease status post remote colectomy with permanent end ileostomy, CKD 3, osteoporosis, Mycobacterium abscessus pulmonary infection, underweight (BMI 17), glaucoma, anemia of chronic disease who presents with 3 days of poor p.o. intake.   She was in her usual state of health until 3-4 days ago, when she lost her appetite.  Since then, she has not had much to eat or drink.  Her son came to visit her and noted that she looked weak and was barely able to hold a conversation.  He notes that when he spoke with her over the phone 5 days ago, she was at baseline.  He feels that at the time of my interview, she has returned to baseline, which she attributes to her receiving IV fluids in the ED. She feels like her anorexia is beginning to improve.  She ate part of a ham sandwich in the ED and has been drinking  water and states I am on the upswing.   She has had no abdominal pain, nausea, vomiting.  She states that her ostomy output was normal until this started, and then decreased.  She has not had any blood in her ostomy output. She has had no fevers, chills, sweats. She has had no pulmonary or urinary infectious symptoms.   Chart review shows that she has lost approximately 10 pounds in the past 2 years, although she was at her current weight 3 years ago.  She is very careful about what she eats.  She is rigorous about avoiding fats and oils.  She states I think to maintain myself, I need 1000 cal. he states that at baseline she is lucky if she can drink 30 ounces of fluid in a day.   She also states I'D be happy to be 72 pounds, but later says she would also be okay gaining a few.  She states that she fears gaining weight because her  mother was overweight.   Chart review shows that the patient was diagnosed last year with Mycobacterium abscessus lung infection.  She and her pulmonologist elected to defer treatment.   When asked about dying in the context of eliciting code status, she says I think about holding my breath and making it happen myself sometimes.  On further questioning, she endorses intermittent passive SI since the passing of her husband 2 years ago.  She has never made a plan, has never attempted self-harm, and does not have SI at this time.  On arrival to the ED, vital signs were T98.1 F, HR 74, BP 121/54, RR 18, SpO2 100% on room air.  Initial labs demonstrated WBC 7.6, Hgb 8.1, NA 130, K4.0, creatinine 2.2 (baseline 1.8-2.0), BUN 46, bicarb 19, AST 50, ALT 38, total bilirubin 1.1, albumin 2.8, anion gap 13.  CRP was 24.4 (from 9.3 1 month ago).  ESR 66 (from 72 1 month ago).  In the ED, the patient received 1 L IVF.  She was admitted to medicine for further evaluation and management. _____________________   Hospital Course by Problem:  # AKI on CKD3, resolved #Dehydration,  resolved # Hyponatremia, mild, asymptomatic Patient presented with creatinine 2.2 from 1.8.  This clinical context, this is very likely a prerenal kidney injury.  She was given 1L IVF with improvement in symptoms and creatinine back to baseline. She was tolerating PO at time of discharge.   # Anorexia, improving # Failure to thrive # Underweight (BMI 17.5) # Concerns for depression Patient presented with a few days of poor p.o. intake secondary to anorexia after having been found by her son to be weak and confused.  Labs are consistent with mild hypovolemic hyponatremia and prerenal AKI, supporting an overall picture of dehydration.  Fortunately, she is much improved after receiving IV fluids in the ED.  The etiology of her anorexia is unclear but I do worry that there is a mental health component to her anorexia and chronic underweight.  Additionally there may be an element of depression at play, which seems to be in the context of the loss of her husband in the past couple of years.  1 final contributor to her underweight status is her Mycobacterium abscessus infection.  Her inflammatory markers are elevated compared to her admission last month.  She does not have any clinical evidence of active Crohn's disease or an acute infection. She has an appointment with GI on 01/11/2024. A repeat CT scan was completed as below with advice to follow-up with her pulmonologist. Discussed mental health resources with her, which she was not interested in at this time.   # Mycobacterium abscessus infection Follows with Lamar Chris at [facility].  Last imaging in 05/2023.  Repeated CT chest while admitted per patient request which noted Extensive heterogeneous reticular nodular infiltrates involving predominantly the right lung with mild involvement of the left lung consistent with an atypical pneumonia and would be consistent with mycobacterial infection but nonspecific. Follow-up is recommended. Patient was on  room air without respiratory symptoms so inpatient treatment was not warranted for this known infection. Patient was advised to reschedule her missed pulmonology appointment for close follow-up. CT scan will be forwarded to her pulmonologist Dr. Chris at [facility].   # Glaucoma Continued home medications: brimonidine, dorzolamide  timolol , latanoprost  & Netarsudil    # Neuropathic pain Continued home gabapentin        Surgeries and Procedures Performed:  None  _____________________  Discharge Exam:  BP  114/42   Pulse 89   Temp 37.1 C (98.8 F) (Oral)   Resp 18   SpO2 95%    General: alert, cooperative, cachectic, in NAD Eyes: conjunctiva clear, anicteric sclera HENT: oropharynx clear, moist mucous membranes Neck: no JVD and supple, symmetrical, trachea midline CV: regular rate and rhythm, without murmurs, rubs or gallops Resp: good air exchange, velcro crackles in the lower lung fields bilaterally Abd: soft, nontender, nondistended, normoactive bowel sounds , no erythema or skin breakdown around ostomy site Rectal: deferred Ext: no lower extremity edema Skin: no rashes or lesions Psych: oriented to time, place and person, mood and affect are appropriate Neuro: Grossly normal  Pertinent Lab Testing: Recent Labs  Lab 12/21/23 1553 12/22/23 0422  NA 130* 133*  K 4.0 3.6  CL 98 101  CO2 19* 21  BUN 46* 41*  CREATININE 2.2* 1.8*  GLUCOSE 85 84  CALCIUM  8.5* 8.6*   Recent Labs  Lab 12/21/23 1553  AST 50*  ALT 38  ALKPHOS 49  TBILI 1.1    Recent Labs  Lab 12/22/23 0422  WBC 5.6  HGB 8.8*  HCT 27.6*  PLT 132*   No results for input(s): APTT, INR in the last 168 hours.   Other Pertinent Labs:   Micro:  None    Pertinent Imaging:   No results found. _____________________  Code Status: DNAR Goals of care were not addressed during this admission.   Status on Discharge:  Current activity: Walks occasionally (12/22/23 0037) Current mobility:  Slightly limited (12/22/23 0037)  Activity Recommendation: activity as tolerated  Other Discharge Instructions: Services setup at discharge: None Tubes/lines at discharge: None  Diet: Diet regular  Wound Care Order Instructions     None       _____________________  Time spent on discharge process: 35 minutes    JULIANN JAMES BANKS, MD [facility]  12/22/2023   Hospital Contact Information:  Madie [facility]ons [facility]) [facility]) [facility])  Pending tests:  Laboratory: [phone] Microbiology: [phone] Pathology: [phone] Radiology: [phone]  General questions: [phone] Pending tests: Laboratory: [phone] Microbiology: [phone] Pathology: [phone] Radiology: [phone]  General questions:  [phone] Pending tests:  Laboratory: [phone] Microbiology: [phone] Pathology: [phone] Radiology: [phone]  General questions:  [phone]    Attestation Statement:   I personally saw and evaluated the patient, and participated in the management and treatment plan as documented in the resident/fellow note.  ALEXANDRA TRENDA PIH, MD

## 2023-12-22 NOTE — ED Notes (Signed)
 Patient discharged to home with all personal belongings and necessary supplies/equipment. All discharge instructions reviewed and questions answered. Patient verbalized understanding of discharge instructions. Vital signs are stable and all IVs removed. Patient denies any questions, concerns, or needs at this time. Patient declined transport and was escorted with all personal belongings by son via wheelchair.

## 2023-12-22 NOTE — Progress Notes (Signed)
 I performed a history and physical examination of Kim [name] as documented in the resident/fellow/APP note and discussed her management with:  Treatment Team:  Registered Nurse: Richie Clara, RN Nursing Assistant: Rogers, [place], CNA Utilization Manager: Braulio Mora, RN Resident: Fleeta Ardeth Aquas, MD First Call: Loflin, Callan Denise, MD   I agree with the history, physical, assessment, and plan of care, with the following exceptions: None    I was present for the following procedures: None Time Spent in Critical Care of the patient: None Time spent in discussions with the patient and family: 13 mins  Kim [name]

## 2023-12-26 ENCOUNTER — Telehealth: Payer: Self-pay | Admitting: Internal Medicine

## 2023-12-26 ENCOUNTER — Telehealth: Payer: Self-pay

## 2023-12-26 NOTE — Telephone Encounter (Signed)
 Called the patient to reschedule canceled appointments per the nurses request. The patient was not pleasant to converse with, but was able to get a conformation of rescheduled appointment details.

## 2023-12-26 NOTE — Telephone Encounter (Signed)
 Spoke with patient in regards to recent Ct scan performed at [facility].  Informed patient that a message was sent to Dr. Sherrod to review and a call will be made with recommendations. She voiced understanding.

## 2023-12-27 NOTE — Telephone Encounter (Signed)
 ID patient with name and date of birth. She has been in the hospital and was confused on how to use her drops. I told her the schedule of how to use her drops.

## 2023-12-28 ENCOUNTER — Encounter: Payer: Self-pay | Admitting: Internal Medicine

## 2023-12-28 ENCOUNTER — Telehealth: Payer: Self-pay

## 2023-12-28 DIAGNOSIS — Z85828 Personal history of other malignant neoplasm of skin: Secondary | ICD-10-CM | POA: Diagnosis not present

## 2023-12-28 DIAGNOSIS — L720 Epidermal cyst: Secondary | ICD-10-CM | POA: Diagnosis not present

## 2023-12-28 DIAGNOSIS — L570 Actinic keratosis: Secondary | ICD-10-CM | POA: Diagnosis not present

## 2023-12-28 DIAGNOSIS — L821 Other seborrheic keratosis: Secondary | ICD-10-CM | POA: Diagnosis not present

## 2023-12-28 DIAGNOSIS — D485 Neoplasm of uncertain behavior of skin: Secondary | ICD-10-CM | POA: Diagnosis not present

## 2023-12-28 NOTE — Telephone Encounter (Signed)
 Spoke with patient regarding Dr. Jeannett recommendations. Per Dr. Sherrod, the patient's recent scan showed atypical pneumonia, which has been managed by pulmonary medicine. She can reach out to her pulmonologist for further recommendations.  Patient stated she is no longer followed by Dr. Lanny office due to communication issues. Informed her that since she is not currently established with a pulmonologist, Dr. Sherrod recommends she reach out to her PCP. Dr. Sherrod recommended Dr. Shellia at [facility] for pulmonary.   Patient also shared that she may need home health care support due to hydration issues. I have contacted the patient's PCP, Dr. Geofm, with this information for further coordination. Patient voiced understanding.

## 2023-12-28 NOTE — Telephone Encounter (Unsigned)
 Copied from CRM #[id number]. 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 Please call pt back ASAP to establish best course of action.

## 2023-12-29 ENCOUNTER — Ambulatory Visit: Payer: Self-pay

## 2023-12-29 NOTE — Telephone Encounter (Signed)
 FYI Only or Action Required?: Action required by provider: request for appointment.  Patient is followed in Pulmonology for nodules, last seen on 03/23/2023 by Shelah Lamar RAMAN, MD.  Called Nurse Triage reporting Hospitalization Follow-up.   Triage Disposition: Call Specialist When Office Open, See PCP Within 2 Weeks, Home Care  Patient/caregiver understands and will follow disposition?: Yes     Copied from CRM [id number]. Topic: Clinical - Red Word Triage >> Dec 29, 2023 12:18 PM Rilla B wrote: [facility] that prompted transfer to Nurse Triage: Weakness, complains of fluid (?)   ----------------------------------------------------------------------- From previous Reason for Contact - Scheduling: Patient/patient representative is calling to schedule an appointment. Refer to attachments for appointment information. Reason for Disposition  Requesting regular office appointment  [1] Condition / symptoms BETTER (improving) AND [2] caller has additional questions triager can answer  Additional Information  Commented on: Answer Assessment    Triager unable to schedule pt for HFU within 14 days, due to no access/availability.   Triager will forward encounter for Dr. Shelah 's office to review and advise. Patient verbalized understanding and is expecting call back from office for HFU appt.  Of note, pt is planning on keeping PCP HFU appt on 01/01/2024.  Answer Assessment - Initial Assessment Questions 1. MAIN CONCERN OR SYMPTOM:  What is your main concern right now? What question do you have? What's the main symptom you're worried about? (e.g., breathing difficulty, ankle swelling, weight gain.)     Trying schedule appt for HFU-- pt reports incidental CT showing possible PNA 2. ONSET: When did the  sx  start?     > 1 week 3. BETTER-SAME-WORSE: Are you getting better, staying the same, or getting worse compared to the day you were discharged?     Better 4. HOSPITALIZATION:  How long were you hospitalized? (e.g., days)     1 5. DISCHARGE DIAGNOSIS:  What problem or disease were you hospitalized for?     Mycobacterial infection, non-TB  Dehydration  6. DISCHARGE DATE: What date were you discharged from the hospital?     12/22/2023 7. DISCHARGE DOCTOR: Who is the main doctor taking care of you now?     Loriaux, Lorane Blank, MD 8. DISCHARGE APPOINTMENT: Have you scheduled a follow-up discharge appointment with your doctor?     Calling to schedule 9. DISCHARGE MEDICINES: Did the doctor (or NP/PA) who discharged you order any new medicines for you to use? If yes, have you filled the prescription and started taking the medicine?      Reports added imodium  10. PAIN: Is there any pain? If Yes, ask: How bad is it?  (Scale 0-10; or none, mild, moderate, severe)       denies 11. FEVER: Do you have a fever? If Yes, ask: What is it, how was it measured  and when did it start?       denies 12. OTHER SYMPTOMS: Do you have any other symptoms?       denies  Protocols used: Post-Hospitalization Follow-up Call-A-[facility], Information Only Call - No Triage-A-[facility]

## 2023-12-29 NOTE — Telephone Encounter (Signed)
 Spoke with patient today.

## 2024-01-01 ENCOUNTER — Ambulatory Visit: Payer: Self-pay | Admitting: Internal Medicine

## 2024-01-01 ENCOUNTER — Encounter: Payer: Self-pay | Admitting: Internal Medicine

## 2024-01-01 VITALS — BP 102/64 | HR 59 | Temp 97.9°F | Ht <= 58 in | Wt 74.0 lb

## 2024-01-01 DIAGNOSIS — N179 Acute kidney failure, unspecified: Secondary | ICD-10-CM | POA: Insufficient documentation

## 2024-01-01 DIAGNOSIS — E860 Dehydration: Secondary | ICD-10-CM | POA: Diagnosis not present

## 2024-01-01 DIAGNOSIS — E43 Unspecified severe protein-calorie malnutrition: Secondary | ICD-10-CM

## 2024-01-01 DIAGNOSIS — K50813 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: Secondary | ICD-10-CM | POA: Diagnosis not present

## 2024-01-01 DIAGNOSIS — A318 Other mycobacterial infections: Secondary | ICD-10-CM | POA: Diagnosis not present

## 2024-01-01 DIAGNOSIS — R34 Anuria and oliguria: Secondary | ICD-10-CM | POA: Insufficient documentation

## 2024-01-01 DIAGNOSIS — N184 Chronic kidney disease, stage 4 (severe): Secondary | ICD-10-CM | POA: Diagnosis not present

## 2024-01-01 DIAGNOSIS — D638 Anemia in other chronic diseases classified elsewhere: Secondary | ICD-10-CM | POA: Diagnosis not present

## 2024-01-01 LAB — URINALYSIS, ROUTINE W REFLEX MICROSCOPIC
Bilirubin Urine: NEGATIVE
Hgb urine dipstick: NEGATIVE
Ketones, ur: NEGATIVE
Nitrite: NEGATIVE
Specific Gravity, Urine: 1.02 (ref 1.000–1.030)
Total Protein, Urine: NEGATIVE
Urine Glucose: NEGATIVE
Urobilinogen, UA: 0.2 (ref 0.0–1.0)
pH: 5.5 (ref 5.0–8.0)

## 2024-01-01 NOTE — Assessment & Plan Note (Signed)
 Acute on chronic She has decreased appetite and decreased oral intake with both food and water She is not sure when her appetite became decreased She is losing weight and has had recent hospitalization secondary to dehydration She has meals coming in Family will be in later this week Referral to palliative care for failure to thrive and above chronic medical problems

## 2024-01-01 NOTE — Assessment & Plan Note (Signed)
 Chronic Following with pulmonary Needs follow-up CT scan which will be done with pulmonary-has upcoming appointment with them

## 2024-01-01 NOTE — Progress Notes (Signed)
 Subjective:    Patient ID: Kim [name], female    DOB: 03-Apr-1941, 83 y.o.   MRN: [id number]     HPI Kim [name] is here for follow up from the hospital.  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  She had seen pulmonary at that time and deferred treatment.  She presented to the ED with decreased p.o. intake.  She was advised by her GI doctor to go to there for IV hydration.  She denies any nausea, vomiting or abdominal pain.  She stated decreased appetite.  She had been taking Imodium because of increased output in her ostomy bag.  Couple of days prior to going to the emergency room she had a couple bites of ice cream and part of a banana.  She did not drink any fluids.  Vital signs were stable.  BUN/creatinine on admission 46/2.2   Ct Chest 7/18 - extensive heterogeneous reticular nodular infiltrates involving predominantly the right lung and mild involvement of the left lung c/w atypical pna and would be consistent w/ mycobacterial infection but nonspecific   AKI on CKD 3-resolved with IV fluids Dehydration-resolved with IV fluids Hyponatremia, mild and asymptomatic Anorexia, failure to thrive-improved Crohn's disease-chronic, stable.  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  Last imaging 05/2023.  CT scan as above.  Last year she deferred treatment.  Follow-up is recommended.   She is here by herself. All she wants to do is sleep.  She has no energy.  She is eating but has no appetite.   She is getting about 2 glasses of water a day.  She feels dehydrated.  She would like to have someone come to her house every 2 weeks and give her IV fluids.  She does not have any appetite and she is not able to tell me when this started.  She does have meals delivered.   She has to force herself to eat and drink and has difficulty eating more or drinking more  Seeing pulmonary on 8/14 for follow-up of her CT scan.      Medications and allergies reviewed with patient and updated if appropriate.  Current Outpatient Medications on File Prior to Visit  Medication Sig Dispense Refill   Abaloparatide (TYMLOS) 3120 MCG/1.56ML SOPN Inject 1.56 mLs into the skin at bedtime.     acetaminophen  (TYLENOL ) 500 MG tablet Take 1,000 mg by mouth every morning.     B-D 3CC LUER-LOK SYR 22GX1-1/2 22G X 1-1/2 3 ML MISC USE AS DIRECTED 3 each PRN   B-D 3CC LUER-LOK SYR 25GX5/8 25G X 5/8 3 ML MISC USE AS DIRECTED. 3 each PRN   brimonidine (ALPHAGAN) 0.2 % ophthalmic solution Place 1 drop into both eyes 3 (three) times daily.     Calcium  Citrate-Vitamin D  200-125 MG-UNIT TABS Take 1-2 tablets by mouth See admin instructions. Take 2 tab in the morning and 1 tab at bedtime     Cholecalciferol  (VITAMIN D3) 50 MCG (2000 UT) TABS Take 2,000 Units by mouth daily.     cyanocobalamin  (VITAMIN B12) 1000 MCG/ML injection INJECT 1 ML IM ONCE A MONTH. 3 mL PRN   dorzolamide -timolol  (COSOPT ) 2-0.5 % ophthalmic solution Place 1 drop into both eyes 2 (two) times daily.     Fluocinolone Acetonide Body 0.01 % OIL  Apply topically.     gabapentin  (NEURONTIN ) 300 MG capsule Take 600 mg by mouth at bedtime.     loratadine (CLARITIN) 10 MG tablet Take 10 mg by mouth daily. [place]     Melatonin 10 MG TBCR Take 10 mg by mouth at bedtime.     Multiple Vitamins-Minerals (ONE-A-DAY WOMENS 50+ ADVANTAGE) TABS Take 1 tablet by mouth daily.     ROCKLATAN 0.02-0.005 % SOLN Place 1 drop into both eyes daily.     Teriparatide 600 MCG/2.4ML SOPN 20mcg Subcutaneous once a day for 30 days     triamcinolone cream (KENALOG) 0.1 % Apply 1 application  topically every other day.     No current facility-administered medications on file prior to visit.     Review of Systems  Constitutional:  Positive for  fatigue. Negative for fever.  HENT:  Negative for sore throat.   Respiratory:  Negative for cough, chest tightness, shortness of breath and wheezing.   Cardiovascular:  Negative for chest pain.  Gastrointestinal:  Positive for diarrhea. Negative for abdominal pain and blood in stool.  Genitourinary:  Positive for decreased urine volume. Negative for dysuria.  Neurological:  Positive for light-headedness. Negative for dizziness and headaches.       Objective:   Vitals:   01/01/24 1119  BP: 102/64  Pulse: (!) 59  Temp: 97.9 F (36.6 C)  SpO2: 96%   BP Readings from Last 3 Encounters:  01/01/24 102/64  06/21/23 (!) 99/53  01/23/23 126/64   Wt Readings from Last 3 Encounters:  01/01/24 74 lb (33.6 kg)  06/21/23 75 lb (34 kg)  03/23/23 78 lb 6.4 oz (35.6 kg)   Body mass index is 16.59 kg/m.    Physical Exam Constitutional:      Comments: Elderly, frail chronically ill female in no acute distress  HENT:     Head: Normocephalic.  Cardiovascular:     Rate and Rhythm: Normal rate and regular rhythm.  Pulmonary:     Effort: Pulmonary effort is normal. No respiratory distress.     Breath sounds: Normal breath sounds. No wheezing or rales.  Abdominal:     General: There is no distension.     Palpations: Abdomen is soft.     Tenderness: There is no abdominal tenderness.  Musculoskeletal:     Right lower leg: No edema.     Left lower leg: No edema.  Skin:    General: Skin is warm and dry.        Lab Results  Component Value Date   WBC 8.5 06/21/2023   HGB 10.8 (L) 06/21/2023   HCT 32.2 (L) 06/21/2023   PLT 247 06/21/2023   GLUCOSE 116 (H) 06/21/2023   CHOL 127 08/22/2011   TRIG 138 08/22/2011   HDL 35 (L) 08/22/2011   LDLCALC 64 08/22/2011   ALT 25 06/21/2023   AST 24 06/21/2023   NA 134 (L) 06/21/2023   K 4.3 06/21/2023   CL 103 06/21/2023   CREATININE 2.41 (H) 06/21/2023   BUN 92 (H) 06/21/2023   CO2 22 06/21/2023   TSH 1.55 01/23/2023   INR 1.09  03/05/2016     Assessment & Plan:    See Problem List for Assessment and Plan of chronic medical problems.    I spent 30 minutes dedicated to the care of this patient on the date of this encounter including review of recent labs, imaging and procedures, speciality notes (pulmonary note, hospital note), obtaining history, communicating with the patient,  ordering referral, and documenting clinical information in the EHR

## 2024-01-01 NOTE — Assessment & Plan Note (Signed)
 Chronic Following with Dr Sherrod Blood count slightly lower from her recent hospitalization Has upcoming appointment

## 2024-01-01 NOTE — Assessment & Plan Note (Signed)
 Acute Likely related to dehydration Will check UA, urine culture to make sure there is no infection which could potentially be contributing to some of her symptoms

## 2024-01-01 NOTE — Patient Instructions (Addendum)
    Give a urine sample downstairs   Medications changes include :   None    A referral was ordered palliative care and someone will call you to schedule an appointment.

## 2024-01-01 NOTE — Assessment & Plan Note (Signed)
 Chronic Following with GI at [facility] Has ileostomy with increased output, but on medication to help with that Stable-

## 2024-01-01 NOTE — Assessment & Plan Note (Signed)
 Chronic Sees nephrology at [facility] She has an upcoming appointment this week and will be getting blood work done there so will not do any blood work today Encouraged increased fluid intake

## 2024-01-01 NOTE — Assessment & Plan Note (Signed)
 Acute Recently hospitalized for dehydration secondary to poor p.o. intake Improved with IV fluids Stressed that we need to avoid dehydration and and that she needs to be drinking more fluids Encouraged better food intake

## 2024-01-01 NOTE — Assessment & Plan Note (Signed)
 Acute Improved after IV fluids and meclizine She does have decreased fluid intake and is high risk AKI again Encouraged her to increase her fluid intake Discussed home IV fluids through home health nursing is not possible

## 2024-01-02 ENCOUNTER — Telehealth: Payer: Self-pay | Admitting: Internal Medicine

## 2024-01-02 LAB — URINE CULTURE

## 2024-01-02 NOTE — Telephone Encounter (Signed)
 Noted

## 2024-01-02 NOTE — Telephone Encounter (Signed)
 Copied from CRM [id number]. Topic: General - Other >> Jan 01, 2024  4:24 PM Avram G wrote: Reason for CRM: Charmaine stated [facility] received an order palliative care and will be handling the from now on going forward

## 2024-01-03 ENCOUNTER — Emergency Department (HOSPITAL_COMMUNITY)
Admission: EM | Admit: 2024-01-03 | Discharge: 2024-01-03 | Disposition: A | Discharge status: Home / Self Care | Attending: Emergency Medicine | Admitting: Emergency Medicine

## 2024-01-03 ENCOUNTER — Emergency Department (HOSPITAL_COMMUNITY)

## 2024-01-03 ENCOUNTER — Other Ambulatory Visit: Payer: Self-pay

## 2024-01-03 DIAGNOSIS — D631 Anemia in chronic kidney disease: Secondary | ICD-10-CM | POA: Diagnosis not present

## 2024-01-03 DIAGNOSIS — N2581 Secondary hyperparathyroidism of renal origin: Secondary | ICD-10-CM | POA: Diagnosis not present

## 2024-01-03 DIAGNOSIS — M25512 Pain in left shoulder: Secondary | ICD-10-CM | POA: Diagnosis not present

## 2024-01-03 DIAGNOSIS — W01198A Fall on same level from slipping, tripping and stumbling with subsequent striking against other object, initial encounter: Secondary | ICD-10-CM | POA: Diagnosis not present

## 2024-01-03 DIAGNOSIS — N1832 Chronic kidney disease, stage 3b: Secondary | ICD-10-CM | POA: Diagnosis not present

## 2024-01-03 DIAGNOSIS — M19012 Primary osteoarthritis, left shoulder: Secondary | ICD-10-CM | POA: Diagnosis not present

## 2024-01-03 DIAGNOSIS — N189 Chronic kidney disease, unspecified: Secondary | ICD-10-CM | POA: Diagnosis not present

## 2024-01-03 DIAGNOSIS — S40012A Contusion of left shoulder, initial encounter: Secondary | ICD-10-CM | POA: Diagnosis not present

## 2024-01-03 DIAGNOSIS — S0181XA Laceration without foreign body of other part of head, initial encounter: Secondary | ICD-10-CM | POA: Insufficient documentation

## 2024-01-03 DIAGNOSIS — W19XXXA Unspecified fall, initial encounter: Secondary | ICD-10-CM | POA: Diagnosis not present

## 2024-01-03 DIAGNOSIS — Z043 Encounter for examination and observation following other accident: Secondary | ICD-10-CM | POA: Diagnosis not present

## 2024-01-03 DIAGNOSIS — R58 Hemorrhage, not elsewhere classified: Secondary | ICD-10-CM | POA: Diagnosis not present

## 2024-01-03 DIAGNOSIS — S01412A Laceration without foreign body of left cheek and temporomandibular area, initial encounter: Secondary | ICD-10-CM | POA: Diagnosis not present

## 2024-01-03 MED ORDER — LIDOCAINE-EPINEPHRINE (PF) 2 %-1:200000 IJ SOLN
10.0000 mL | Freq: Once | INTRAMUSCULAR | Status: DC
  Filled 2024-01-03: qty 20

## 2024-01-03 NOTE — ED Provider Notes (Signed)
  [place] EMERGENCY DEPARTMENT AT [facility] Provider Note   CSN: [id number] Arrival date & time: 01/03/24  9074         .Laceration Repair  Date/Time: 01/03/2024 10:58 AM  Performed by: Glendia Rocky SAILOR, PA-C Authorized by: Glendia Rocky SAILOR, PA-C   Consent:    Consent obtained:  Verbal   Consent given by:  Patient   Risks, benefits, and alternatives were discussed: yes     Risks discussed:  Infection, pain, retained foreign body, tendon damage, vascular damage, poor wound healing, poor cosmetic result, need for additional repair and nerve damage   Alternatives discussed:  No treatment Universal protocol:    Procedure explained and questions answered to patient or proxy's satisfaction: yes     Patient identity confirmed:  Verbally with patient Anesthesia:    Anesthesia method:  Local infiltration   Local anesthetic:  Lidocaine  2% WITH epi Laceration details:    Location:  Face   Face location:  L cheek   Length (cm):  2 Exploration:    Hemostasis achieved with:  Direct pressure and epinephrine    Wound exploration: entire depth of wound visualized   Treatment:    Area cleansed with:  Chlorhexidine    Amount of cleaning:  Standard   Irrigation solution:  Sterile saline   Irrigation method:  Syringe   Visualized foreign bodies/material removed: no   Skin repair:    Repair method:  Sutures   Suture size:  6-0   Wound skin closure material used: Vicryl.   Suture technique:  Simple interrupted   Number of sutures:  7 Approximation:    Approximation:  Close Repair type:    Repair type:  Simple Post-procedure details:    Dressing:  Open (no dressing)   Procedure completion:  Tolerated well, no immediate complications    Medications Ordered in the ED  lidocaine -EPINEPHrine  (XYLOCAINE  W/EPI) 2 %-1:200000 (PF) injection 10 mL (has no administration in time range)          Glendia Rocky SAILOR, PA-C 01/03/24 1100    Dasie Faden, MD 01/03/24 1133

## 2024-01-03 NOTE — ED Triage Notes (Signed)
 Patient to ED by EMS from [facility] for fall. She fell this morning hitting R side of face causing laceration. She denies LOC and denies thinners. She has a 1:00 kidney appointment (contact Noreene Mace [phone])

## 2024-01-03 NOTE — ED Provider Notes (Signed)
 [place] EMERGENCY DEPARTMENT AT [facility] Provider Note   CSN: [id number] Arrival date & time: 01/03/24  9074     Patient presents with: [name] is a 83 y.o. female.   83 year old female who had mechanical fall prior to arrival.  Patient states that she fell and struck the left side of her face.  Does not take blood thinners.  Has no LOC.  Complains of some mild left shoulder pain.  No rib pain.  No pain from below the waist.       Prior to Admission medications   Medication Sig Start Date End Date Taking? Authorizing Provider  Abaloparatide (TYMLOS) 3120 MCG/1.56ML SOPN Inject 1.56 mLs into the skin at bedtime.    [provider]  acetaminophen  (TYLENOL ) 500 MG tablet Take 1,000 mg by mouth every morning.    [provider]  B-D 3CC LUER-LOK SYR 22GX1-1/2 22G X 1-1/2 3 ML MISC USE AS DIRECTED 12/31/21   Geofm Glade PARAS, MD  B-D 3CC LUER-LOK SYR 25GX5/8 25G X 5/8 3 ML MISC USE AS DIRECTED. 02/07/23   Burns, Glade PARAS, MD  brimonidine (ALPHAGAN) 0.2 % ophthalmic solution Place 1 drop into both eyes 3 (three) times daily. 04/01/21   [provider]  Calcium  Citrate-Vitamin D  200-125 MG-UNIT TABS Take 1-2 tablets by mouth See admin instructions. Take 2 tab in the morning and 1 tab at bedtime    [provider]  Cholecalciferol  (VITAMIN D3) 50 MCG (2000 UT) TABS Take 2,000 Units by mouth daily.    [provider]  cyanocobalamin  (VITAMIN B12) 1000 MCG/ML injection INJECT 1 ML IM ONCE A MONTH. 02/07/23   Burns, Glade PARAS, MD  dorzolamide -timolol  (COSOPT ) 2-0.5 % ophthalmic solution Place 1 drop into both eyes 2 (two) times daily. 09/20/22   [provider]  Fluocinolone Acetonide Body 0.01 % OIL Apply topically. 01/19/23   [provider]  gabapentin  (NEURONTIN ) 300 MG capsule Take 600 mg by mouth at bedtime. 08/09/13   [provider]  loratadine (CLARITIN) 10 MG tablet Take 10 mg by mouth daily.  [place]    [provider]  Melatonin 10 MG TBCR Take 10 mg by mouth at bedtime.    [provider]  Multiple Vitamins-Minerals (ONE-A-DAY WOMENS 50+ ADVANTAGE) TABS Take 1 tablet by mouth daily.    [provider]  ROCKLATAN 0.02-0.005 % SOLN Place 1 drop into both eyes daily. 09/16/21   [provider]  Teriparatide 600 MCG/2.4ML SOPN 20mcg Subcutaneous once a day for 30 days 11/29/22   [provider]  triamcinolone cream (KENALOG) 0.1 % Apply 1 application  topically every other day.    [provider]    Allergies: Antihistamines, chlorpheniramine-type; Chlorpheniramine; Hydrocodone -acetaminophen ; Metronidazole; Other; Sulfa antibiotics; Tape; Wound dressing adhesive; and Chlorhexidine     Review of Systems  All other systems reviewed and are negative.   Updated Vital Signs BP (!) 127/52 (BP Location: Right Arm)   Pulse 79   Temp 99.7 F (37.6 C) (Oral)   Resp 15   Ht 1.422 m (4' 8)   Wt 33.6 kg   SpO2 95%   BMI 16.59 kg/m   Physical Exam Vitals and nursing note reviewed.  Constitutional:      General: She is not in acute distress.    Appearance: Normal appearance. She is well-developed. She is not toxic-appearing.  HENT:     Head: Normocephalic and atraumatic.   Eyes:  General: Lids are normal.     Conjunctiva/sclera: Conjunctivae normal.     Pupils: Pupils are equal, round, and reactive to light.  Neck:     Thyroid: No thyroid mass.     Trachea: No tracheal deviation.  Cardiovascular:     Rate and Rhythm: Normal rate and regular rhythm.     Heart sounds: Normal heart sounds. No murmur heard.    No gallop.  Pulmonary:     Effort: Pulmonary effort is normal. No respiratory distress.     Breath sounds: Normal breath sounds. No stridor. No decreased breath sounds, wheezing, rhonchi or rales.  Abdominal:     General: There is no distension.     Palpations: Abdomen is soft.     Tenderness: There is no  abdominal tenderness. There is no rebound.  Musculoskeletal:        General: No tenderness. Normal range of motion.     Cervical back: Normal range of motion and neck supple.  Skin:    General: Skin is warm and dry.     Findings: No abrasion or rash.  Neurological:     Mental Status: She is alert and oriented to person, place, and time. Mental status is at baseline.     GCS: GCS eye subscore is 4. GCS verbal subscore is 5. GCS motor subscore is 6.     Cranial Nerves: No cranial nerve deficit.     Sensory: No sensory deficit.     Motor: Motor function is intact.  Psychiatric:        Attention and Perception: Attention normal.        Speech: Speech normal.        Behavior: Behavior normal.     (all labs ordered are listed, but only abnormal results are displayed) Labs Reviewed - No data to display  EKG: None  Radiology: No results found.   Procedures   Medications Ordered in the ED - No data to display                                  Medical Decision Making Amount and/or Complexity of Data Reviewed Radiology: ordered.   Facial laceration repaired by physician assistant.  Please see her note for further documentation.  X-ray of shoulder negative for fracture.  Will discharge home     Final diagnoses:  None    ED Discharge Orders     None          Dasie Faden, MD 01/03/24 1133

## 2024-01-03 NOTE — ED Notes (Addendum)
 RN call Noreene Mace informing him that patient is ready for pick up

## 2024-01-11 DIAGNOSIS — N184 Chronic kidney disease, stage 4 (severe): Secondary | ICD-10-CM | POA: Diagnosis not present

## 2024-01-11 DIAGNOSIS — E559 Vitamin D deficiency, unspecified: Secondary | ICD-10-CM | POA: Diagnosis not present

## 2024-01-11 DIAGNOSIS — K50818 Crohn's disease of both small and large intestine with other complication: Secondary | ICD-10-CM | POA: Diagnosis not present

## 2024-01-11 DIAGNOSIS — Z9049 Acquired absence of other specified parts of digestive tract: Secondary | ICD-10-CM | POA: Diagnosis not present

## 2024-01-11 DIAGNOSIS — E43 Unspecified severe protein-calorie malnutrition: Secondary | ICD-10-CM | POA: Diagnosis not present

## 2024-01-11 DIAGNOSIS — Z932 Ileostomy status: Secondary | ICD-10-CM | POA: Diagnosis not present

## 2024-01-14 ENCOUNTER — Other Ambulatory Visit: Payer: Self-pay

## 2024-01-14 ENCOUNTER — Encounter (HOSPITAL_COMMUNITY): Payer: Self-pay | Admitting: Emergency Medicine

## 2024-01-14 ENCOUNTER — Inpatient Hospital Stay (HOSPITAL_COMMUNITY)
Admission: EM | Admit: 2024-01-14 | Discharge: 2024-01-19 | DRG: 682 | Disposition: A | Discharge status: Hospice | Attending: Internal Medicine | Admitting: Internal Medicine

## 2024-01-14 ENCOUNTER — Emergency Department (HOSPITAL_COMMUNITY)

## 2024-01-14 DIAGNOSIS — J189 Pneumonia, unspecified organism: Secondary | ICD-10-CM | POA: Diagnosis not present

## 2024-01-14 DIAGNOSIS — N184 Chronic kidney disease, stage 4 (severe): Secondary | ICD-10-CM | POA: Diagnosis present

## 2024-01-14 DIAGNOSIS — E860 Dehydration: Secondary | ICD-10-CM | POA: Diagnosis not present

## 2024-01-14 DIAGNOSIS — R509 Fever, unspecified: Secondary | ICD-10-CM | POA: Diagnosis not present

## 2024-01-14 DIAGNOSIS — E43 Unspecified severe protein-calorie malnutrition: Secondary | ICD-10-CM | POA: Diagnosis present

## 2024-01-14 DIAGNOSIS — Z883 Allergy status to other anti-infective agents status: Secondary | ICD-10-CM | POA: Diagnosis not present

## 2024-01-14 DIAGNOSIS — D6489 Other specified anemias: Secondary | ICD-10-CM | POA: Diagnosis not present

## 2024-01-14 DIAGNOSIS — Z932 Ileostomy status: Secondary | ICD-10-CM

## 2024-01-14 DIAGNOSIS — E872 Acidosis, unspecified: Secondary | ICD-10-CM | POA: Diagnosis not present

## 2024-01-14 DIAGNOSIS — Z9049 Acquired absence of other specified parts of digestive tract: Secondary | ICD-10-CM

## 2024-01-14 DIAGNOSIS — Z882 Allergy status to sulfonamides status: Secondary | ICD-10-CM

## 2024-01-14 DIAGNOSIS — Z711 Person with feared health complaint in whom no diagnosis is made: Secondary | ICD-10-CM | POA: Diagnosis not present

## 2024-01-14 DIAGNOSIS — R54 Age-related physical debility: Secondary | ICD-10-CM | POA: Diagnosis present

## 2024-01-14 DIAGNOSIS — Z66 Do not resuscitate: Secondary | ICD-10-CM | POA: Diagnosis not present

## 2024-01-14 DIAGNOSIS — R531 Weakness: Secondary | ICD-10-CM | POA: Diagnosis not present

## 2024-01-14 DIAGNOSIS — J168 Pneumonia due to other specified infectious organisms: Secondary | ICD-10-CM | POA: Diagnosis not present

## 2024-01-14 DIAGNOSIS — Z8249 Family history of ischemic heart disease and other diseases of the circulatory system: Secondary | ICD-10-CM

## 2024-01-14 DIAGNOSIS — Z833 Family history of diabetes mellitus: Secondary | ICD-10-CM

## 2024-01-14 DIAGNOSIS — M810 Age-related osteoporosis without current pathological fracture: Secondary | ICD-10-CM | POA: Diagnosis present

## 2024-01-14 DIAGNOSIS — R4589 Other symptoms and signs involving emotional state: Secondary | ICD-10-CM

## 2024-01-14 DIAGNOSIS — Z87891 Personal history of nicotine dependence: Secondary | ICD-10-CM

## 2024-01-14 DIAGNOSIS — Z515 Encounter for palliative care: Secondary | ICD-10-CM

## 2024-01-14 DIAGNOSIS — Z85828 Personal history of other malignant neoplasm of skin: Secondary | ICD-10-CM

## 2024-01-14 DIAGNOSIS — Z79899 Other long term (current) drug therapy: Secondary | ICD-10-CM | POA: Diagnosis not present

## 2024-01-14 DIAGNOSIS — Z8349 Family history of other endocrine, nutritional and metabolic diseases: Secondary | ICD-10-CM

## 2024-01-14 DIAGNOSIS — Z681 Body mass index (BMI) 19 or less, adult: Secondary | ICD-10-CM

## 2024-01-14 DIAGNOSIS — F4320 Adjustment disorder, unspecified: Secondary | ICD-10-CM | POA: Diagnosis present

## 2024-01-14 DIAGNOSIS — R64 Cachexia: Secondary | ICD-10-CM | POA: Diagnosis present

## 2024-01-14 DIAGNOSIS — F419 Anxiety disorder, unspecified: Secondary | ICD-10-CM | POA: Diagnosis present

## 2024-01-14 DIAGNOSIS — H409 Unspecified glaucoma: Secondary | ICD-10-CM | POA: Diagnosis present

## 2024-01-14 DIAGNOSIS — E46 Unspecified protein-calorie malnutrition: Secondary | ICD-10-CM

## 2024-01-14 DIAGNOSIS — Z7189 Other specified counseling: Secondary | ICD-10-CM | POA: Diagnosis not present

## 2024-01-14 DIAGNOSIS — R918 Other nonspecific abnormal finding of lung field: Secondary | ICD-10-CM | POA: Diagnosis present

## 2024-01-14 DIAGNOSIS — D631 Anemia in chronic kidney disease: Secondary | ICD-10-CM | POA: Diagnosis present

## 2024-01-14 DIAGNOSIS — N179 Acute kidney failure, unspecified: Principal | ICD-10-CM | POA: Diagnosis present

## 2024-01-14 DIAGNOSIS — Z888 Allergy status to other drugs, medicaments and biological substances status: Secondary | ICD-10-CM

## 2024-01-14 DIAGNOSIS — J479 Bronchiectasis, uncomplicated: Secondary | ICD-10-CM | POA: Diagnosis not present

## 2024-01-14 DIAGNOSIS — Z7401 Bed confinement status: Secondary | ICD-10-CM | POA: Diagnosis not present

## 2024-01-14 DIAGNOSIS — R1310 Dysphagia, unspecified: Secondary | ICD-10-CM | POA: Diagnosis present

## 2024-01-14 DIAGNOSIS — R059 Cough, unspecified: Secondary | ICD-10-CM | POA: Diagnosis not present

## 2024-01-14 DIAGNOSIS — K5090 Crohn's disease, unspecified, without complications: Secondary | ICD-10-CM | POA: Diagnosis present

## 2024-01-14 DIAGNOSIS — Z634 Disappearance and death of family member: Secondary | ICD-10-CM

## 2024-01-14 LAB — COMPREHENSIVE METABOLIC PANEL WITH GFR
ALT: 29 U/L (ref 0–44)
AST: 31 U/L (ref 15–41)
Albumin: 3 g/dL — ABNORMAL LOW (ref 3.5–5.0)
Alkaline Phosphatase: 50 U/L (ref 38–126)
Anion gap: 13 (ref 5–15)
BUN: 67 mg/dL — ABNORMAL HIGH (ref 8–23)
CO2: 14 mmol/L — ABNORMAL LOW (ref 22–32)
Calcium: 10.6 mg/dL — ABNORMAL HIGH (ref 8.9–10.3)
Chloride: 108 mmol/L (ref 98–111)
Creatinine, Ser: 3.22 mg/dL — ABNORMAL HIGH (ref 0.44–1.00)
GFR, Estimated: 14 mL/min — ABNORMAL LOW (ref >60)
Glucose, Bld: 111 mg/dL — ABNORMAL HIGH (ref 70–99)
Potassium: 3.7 mmol/L (ref 3.5–5.1)
Sodium: 135 mmol/L (ref 135–145)
Total Bilirubin: 0.6 mg/dL (ref 0.0–1.2)
Total Protein: 7.8 g/dL (ref 6.5–8.1)

## 2024-01-14 LAB — URINALYSIS, W/ REFLEX TO CULTURE (INFECTION SUSPECTED)
Bilirubin Urine: NEGATIVE
Glucose, UA: NEGATIVE mg/dL
Hgb urine dipstick: NEGATIVE
Ketones, ur: NEGATIVE mg/dL
Nitrite: NEGATIVE
Protein, ur: NEGATIVE mg/dL
Specific Gravity, Urine: 1.011 (ref 1.005–1.030)
pH: 5 (ref 5.0–8.0)

## 2024-01-14 LAB — CBC
HCT: 34.5 % — ABNORMAL LOW (ref 36.0–46.0)
Hemoglobin: 10.8 g/dL — ABNORMAL LOW (ref 12.0–15.0)
MCH: 30.4 pg (ref 26.0–34.0)
MCHC: 31.3 g/dL (ref 30.0–36.0)
MCV: 97.2 fL (ref 80.0–100.0)
Platelets: 261 K/uL (ref 150–400)
RBC: 3.55 MIL/uL — ABNORMAL LOW (ref 3.87–5.11)
RDW: 14 % (ref 11.5–15.5)
WBC: 9.4 K/uL (ref 4.0–10.5)
nRBC: 0 % (ref 0.0–0.2)

## 2024-01-14 LAB — LIPASE, BLOOD: Lipase: 25 U/L (ref 11–51)

## 2024-01-14 LAB — I-STAT CG4 LACTIC ACID, ED: Lactic Acid, Venous: 1.8 mmol/L (ref 0.5–1.9)

## 2024-01-14 MED ORDER — ONE-A-DAY WOMENS 50+ ADVANTAGE PO TABS: 1.0000 | ORAL_TABLET | Freq: Every day | ORAL | Status: DC

## 2024-01-14 MED ORDER — ACETAMINOPHEN 325 MG PO TABS
650.0000 mg | ORAL_TABLET | Freq: Four times a day (QID) | ORAL | Status: DC | PRN
  Administered 2024-01-17 (×2): 650 mg via ORAL
  Filled 2024-01-14: qty 2

## 2024-01-14 MED ORDER — NETARSUDIL-LATANOPROST 0.02-0.005 % OP SOLN
1.0000 [drp] | Freq: Every day | OPHTHALMIC | Status: DC
  Administered 2024-01-14 – 2024-01-16 (×5): 1 [drp] via OPHTHALMIC

## 2024-01-14 MED ORDER — LACTATED RINGERS IV SOLN: INTRAVENOUS | Status: DC

## 2024-01-14 MED ORDER — GUAIFENESIN 100 MG/5ML PO LIQD: 5.0000 mL | ORAL | Status: DC | PRN

## 2024-01-14 MED ORDER — PROCHLORPERAZINE EDISYLATE 10 MG/2ML IJ SOLN: 5.0000 mg | Freq: Four times a day (QID) | INTRAMUSCULAR | Status: DC | PRN

## 2024-01-14 MED ORDER — MELATONIN 5 MG PO TABS
5.0000 mg | ORAL_TABLET | Freq: Every evening | ORAL | Status: DC | PRN
  Administered 2024-01-15 – 2024-01-18 (×5): 5 mg via ORAL
  Filled 2024-01-14 (×3): qty 1

## 2024-01-14 MED ORDER — IPRATROPIUM-ALBUTEROL 0.5-2.5 (3) MG/3ML IN SOLN: 3.0000 mL | RESPIRATORY_TRACT | Status: DC | PRN

## 2024-01-14 MED ORDER — SODIUM CHLORIDE 0.9 % IV SOLN
2.0000 g | INTRAVENOUS | Status: DC
  Administered 2024-01-15 – 2024-01-17 (×6): 2 g via INTRAVENOUS
  Filled 2024-01-14 (×4): qty 20

## 2024-01-14 MED ORDER — SODIUM CHLORIDE 0.9 % IV SOLN
500.0000 mg | Freq: Once | INTRAVENOUS | Status: AC
  Administered 2024-01-14: 500 mg via INTRAVENOUS
  Filled 2024-01-14: qty 5

## 2024-01-14 MED ORDER — ADULT MULTIVITAMIN W/MINERALS CH
1.0000 | ORAL_TABLET | Freq: Every day | ORAL | Status: DC
  Administered 2024-01-15 – 2024-01-17 (×6): 1 via ORAL
  Filled 2024-01-14 (×4): qty 1

## 2024-01-14 MED ORDER — LACTATED RINGERS IV BOLUS
500.0000 mL | Freq: Once | INTRAVENOUS | Status: AC
  Administered 2024-01-14: 500 mL via INTRAVENOUS

## 2024-01-14 MED ORDER — POLYETHYLENE GLYCOL 3350 17 G PO PACK: 17.0000 g | PACK | Freq: Every day | ORAL | Status: DC | PRN

## 2024-01-14 MED ORDER — SODIUM CHLORIDE 0.9 % IV SOLN
1.0000 g | Freq: Once | INTRAVENOUS | Status: AC
  Administered 2024-01-14: 1 g via INTRAVENOUS
  Filled 2024-01-14: qty 10

## 2024-01-14 MED ORDER — HEPARIN SODIUM (PORCINE) 5000 UNIT/ML IJ SOLN
5000.0000 [IU] | Freq: Three times a day (TID) | INTRAMUSCULAR | Status: DC
  Administered 2024-01-15 – 2024-01-17 (×14): 5000 [IU] via SUBCUTANEOUS
  Filled 2024-01-14 (×8): qty 1

## 2024-01-14 MED ORDER — BOOST / RESOURCE BREEZE PO LIQD CUSTOM
1.0000 | Freq: Three times a day (TID) | ORAL | Status: DC
  Administered 2024-01-15 – 2024-01-17 (×18): 1 via ORAL
  Filled 2024-01-14: qty 1

## 2024-01-14 MED ORDER — BRIMONIDINE TARTRATE 0.2 % OP SOLN
1.0000 [drp] | Freq: Three times a day (TID) | OPHTHALMIC | Status: DC
  Administered 2024-01-14 – 2024-01-19 (×21): 1 [drp] via OPHTHALMIC
  Filled 2024-01-14: qty 5

## 2024-01-14 MED ORDER — SODIUM CHLORIDE 0.9 % IV SOLN
500.0000 mg | INTRAVENOUS | Status: DC
  Administered 2024-01-15 (×2): 500 mg via INTRAVENOUS
  Filled 2024-01-14 (×2): qty 5

## 2024-01-14 MED ORDER — DORZOLAMIDE HCL-TIMOLOL MAL 2-0.5 % OP SOLN
1.0000 [drp] | Freq: Two times a day (BID) | OPHTHALMIC | Status: DC
  Administered 2024-01-14 – 2024-01-19 (×12): 1 [drp] via OPHTHALMIC
  Filled 2024-01-14: qty 10

## 2024-01-14 NOTE — H&P (Addendum)
 History and Physical  Kim [name] FMW:[id number] DOB: [birth year] DOA: 01/14/2024  Referring physician: Dr. Randol, EDP  PCP: Geofm Glade PARAS, MD  Outpatient Specialists: GI, pulmonary, hematology/oncology. Patient coming from: Home ([place]).  Chief Complaint: Generalized weakness, cough, fever  HPI: Kim [name] is a 83 y.o. female with medical history significant for Crohn's disease with ileostomy, osteoporosis, CKD 3B, former smoker, quit more than 50 years ago, who presents to the ER from home due to generalized weakness x 2 days associated with a worsening productive cough, and a subjective fever today.  The patient's lost her husband 11/27/22 and since then has had a decrease in her appetite and at least 20 lbs unintentional weight loss.  Had 2 hospitalizations at [facility] for dehydration this year.  She presents today after being found generally weak at home for the past 2 days.  She was noted to have a fever today and brought into the ER for further evaluation.  Admits to having trouble swallowing at times.  Denies odynophagia.  In the ER, febrile with Tmax 100.6 and soft BPs.  Chest x-ray revealed right-sided infiltrates suggestive of pneumonia.  Lab studies notable for elevated creatinine 3.22 from baseline 1.6.  The patient received LR bolus 500 cc x 1.  Peripheral blood cultures x 2 were obtained, and she was started on on empiric IV antibiotics for CAP.  TRH, hospitalist service, was asked to admit.  ED Course: Tmax 100.6.  BP 125/58, pulse 86, respiration rate 17, O2 saturation 95% on room air.  Review of Systems: Review of systems as noted in the HPI. All other systems reviewed and are negative.   Past Medical History:  Diagnosis Date   Anemia    related to malabsorption--gets B12 shots and iron infusions, managed by Dr. Sherrod   [facility] (basal cell carcinoma of skin)    Crohn disease (HCC)    Dr. Dudley at [facility]   Glaucoma    Dr. Frutoso   Intestinal malabsorption     OA (osteoarthritis)    hands,back   Osteoporosis    managed by Dr. Shona at [place], [place] q2 yrs   Past Surgical History:  Procedure Laterality Date   BREAST EXCISIONAL BIOPSY Right    BRONCHIAL BIOPSY  12/26/2022   Procedure: BRONCHIAL BIOPSIES;  Surgeon: Shelah Lamar RAMAN, MD;  Location: [facility] ENDOSCOPY;  Service: Pulmonary;;   BRONCHIAL BRUSHINGS  12/26/2022   Procedure: BRONCHIAL BRUSHINGS;  Surgeon: Shelah Lamar RAMAN, MD;  Location: [facility] ENDOSCOPY;  Service: Pulmonary;;   BRONCHIAL NEEDLE ASPIRATION BIOPSY  12/26/2022   Procedure: BRONCHIAL NEEDLE ASPIRATION BIOPSIES;  Surgeon: Shelah Lamar RAMAN, MD;  Location: [facility] ENDOSCOPY;  Service: Pulmonary;;   BRONCHIAL WASHINGS  12/26/2022   Procedure: BRONCHIAL WASHINGS;  Surgeon: Shelah Lamar RAMAN, MD;  Location: [facility] ENDOSCOPY;  Service: Pulmonary;;   Crohn  2009   internal ostomy pouch  1994   INTRAMEDULLARY (IM) NAIL INTERTROCHANTERIC Left 03/05/2016   Procedure: INTRAMEDULLARY (IM) NAIL INTERTROCHANTRIC;  Surgeon: Selinda Belvie Gosling, MD;  Location: [facility] OR;  Service: Orthopedics;  Laterality: Left;   REDUCTION MAMMAPLASTY Bilateral 01/2017   TOTAL COLECTOMY  1984   due to Crohn's    Social History:  reports that she quit smoking about 51 years ago. Her smoking use included cigarettes. She has never used smokeless tobacco. She reports that she does not drink alcohol and does not use drugs.   Allergies  Allergen Reactions   Antihistamines, Chlorpheniramine-Type     Dry  Mouth, dry  eyes-so dry she cannot even talk   Chlorpheniramine Other (See Comments)    Dry  Mouth, dry eyes-so dry she cannot even talk   Hydrocodone -Acetaminophen  Nausea And Vomiting   Metronidazole Other (See Comments)    Peripheral neuropathy    Other Diarrhea and Nausea And Vomiting    Patient has an Ileostomy Patient has an Energy manager, Antihistamine    Sulfa Antibiotics Other (See Comments)    UNKNOWN   Tape Other (See Comments)    SKIN WILL TEAR WITH  ANYTHING OTHER THAN PAPER TAPE OR COBAN WRAP     Wound Dressing Adhesive Other (See Comments)    SKIN WILL TEAR WITH ANYTHING OTHER THAN PAPER TAPE OR COBAN WRAP   Chlorhexidine  Itching and Rash    Family History  Problem Relation Age of Onset   Diabetes Mother        borderline   Heart disease Mother        MI   Heart disease Father    Thyroid disease Sister       Prior to Admission medications   Medication Sig Start Date End Date Taking? Authorizing Provider  Abaloparatide  (TYMLOS ) 3120 MCG/1.56ML SOPN Inject 1.56 mLs into the skin at bedtime.    [provider]  acetaminophen  (TYLENOL ) 500 MG tablet Take 1,000 mg by mouth every morning.    [provider]  B-D 3CC LUER-LOK SYR 22GX1-1/2 22G X 1-1/2 3 ML MISC USE AS DIRECTED 12/31/21   Geofm Glade PARAS, MD  B-D 3CC LUER-LOK SYR 25GX5/8 25G X 5/8 3 ML MISC USE AS DIRECTED. 02/07/23   Burns, Glade PARAS, MD  brimonidine  (ALPHAGAN ) 0.2 % ophthalmic solution Place 1 drop into both eyes 3 (three) times daily. 04/01/21   [provider]  Calcium  Citrate-Vitamin D  200-125 MG-UNIT TABS Take 1-2 tablets by mouth See admin instructions. Take 2 tab in the morning and 1 tab at bedtime    [provider]  Cholecalciferol  (VITAMIN D3) 50 MCG (2000 UT) TABS Take 2,000 Units by mouth daily.    [provider]  cyanocobalamin  (VITAMIN B12) 1000 MCG/ML injection INJECT 1 ML IM ONCE A MONTH. 02/07/23   Burns, Glade PARAS, MD  dorzolamide -timolol  (COSOPT ) 2-0.5 % ophthalmic solution Place 1 drop into both eyes 2 (two) times daily. 09/20/22   [provider]  Fluocinolone Acetonide Body 0.01 % OIL Apply topically. 01/19/23   [provider]  gabapentin  (NEURONTIN ) 300 MG capsule Take 600 mg by mouth at bedtime. 08/09/13   [provider]  loratadine (CLARITIN) 10 MG tablet Take 10 mg by mouth daily. [place]    [provider]  Melatonin 10 MG TBCR Take 10 mg by mouth at bedtime.     [provider]  Multiple Vitamins-Minerals (ONE-A-DAY WOMENS 50+ ADVANTAGE) TABS Take 1 tablet by mouth daily.    [provider]  ROCKLATAN  0.02-0.005 % SOLN Place 1 drop into both eyes daily. 09/16/21   [provider]  Teriparatide 600 MCG/2.4ML SOPN 20mcg Subcutaneous once a day for 30 days 11/29/22   [provider]  triamcinolone cream (KENALOG) 0.1 % Apply 1 application  topically every other day.    [provider]    Physical Exam: BP (!) 125/58   Pulse 86   Temp (!) 100.6 F (38.1 C) (Oral)   Resp 17   Ht 4' 8 (1.422 m)   Wt 33.6 kg   SpO2 95%   BMI 16.59 kg/m   General: 83  y.o. year-old female well developed well nourished in no acute distress.  Alert and oriented x3. Cardiovascular: Regular rate and rhythm with no rubs or gallops.  No thyromegaly or JVD noted.  No lower extremity edema. 2/4 pulses in all 4 extremities. Respiratory: Diffuse rales bilaterally with poor inspiratory effort. Abdomen: Soft nontender nondistended with normal bowel sounds x4 quadrants. Muskuloskeletal: No cyanosis, clubbing or edema noted bilaterally Neuro: CN II-XII intact, strength, sensation, reflexes Skin: No ulcerative lesions noted or rashes Psychiatry: Judgement and insight appear normal. Mood is appropriate for condition and setting          Labs on Admission:  Basic Metabolic Panel: Recent Labs  Lab 01/14/24 1540  NA 135  K 3.7  CL 108  CO2 14*  GLUCOSE 111*  BUN 67*  CREATININE 3.22*  CALCIUM  10.6*   Liver Function Tests: Recent Labs  Lab 01/14/24 1540  AST 31  ALT 29  ALKPHOS 50  BILITOT 0.6  PROT 7.8  ALBUMIN 3.0*   Recent Labs  Lab 01/14/24 1540  LIPASE 25   No results for input(s): AMMONIA in the last 168 hours. CBC: Recent Labs  Lab 01/14/24 1540  WBC 9.4  HGB 10.8*  HCT 34.5*  MCV 97.2  PLT 261   Cardiac Enzymes: No results for input(s): CKTOTAL, CKMB, CKMBINDEX, TROPONINI in the last 168  hours.  BNP (last 3 results) No results for input(s): BNP in the last 8760 hours.  ProBNP (last 3 results) No results for input(s): PROBNP in the last 8760 hours.  CBG: No results for input(s): GLUCAP in the last 168 hours.  Radiological Exams on Admission: DG Chest Portable 1 View Result Date: 01/14/2024 CLINICAL DATA:  Cough and weakness EXAM: PORTABLE CHEST 1 VIEW COMPARISON:  Chest x-ray 12/26/2022.  CT of the chest 03/17/2023. FINDINGS: There scattered interstitial opacities throughout the entire right lung which have increased from prior. Cluster of nodules in the right lower lung again seen. Bronchiectasis in the right upper lobe is again seen. The left lung appears clear. There is blunting of the right costophrenic angle, a new finding. There is no pneumothorax. The cardiomediastinal silhouette is within normal limits. No acute fractures are identified. IMPRESSION: 1. Increased interstitial opacities throughout the right lung with new blunting of the right costophrenic angle. Findings are concerning for infection. 2. Stable cluster of nodules in the right lower lung. 3. Questionable trace right pleural effusion. Electronically Signed   By: Greig Pique M.D.   On: 01/14/2024 15:58    EKG: I independently viewed the EKG done and my findings are as followed: None available at the time of this visit.  Assessment/Plan Present on Admission:  AKI (acute kidney injury) (HCC)  Principal Problem:   AKI (acute kidney injury) (HCC)  AKI, suspect prerenal in the setting of dehydration from poor oral intake BUN to creatinine ratio greater than 20 Baseline creatinine appears to be 1.6 with GFR of 30. Presented with creatinine of 3.22 with GFR 14. Avoid nephrotoxic agents, dehydration, and and hypotension. Continue IV fluid hydration LR at 75 cc/h x 2 days Closely monitor urine output Repeat BMP in the morning  Right-sided community-acquired pneumonia, POA Continue empiric IV  antibiotics Rocephin  and azithromycin  Continue aspiration precautions As needed antitussives and bronchodilators Follow baseline procalcitonin and peripheral blood cultures x 2  Possible dysphagia, unspecified Admits to having trouble swallowing at times.  Denies odynophagia. Speech therapist consulted for formal swallow evaluation Aspiration precautions are in place  Non anion gap metabolic  acidosis secondary to acute renal insufficiency Serum bicarb 14 and anion gap of 13 Continue to treat underlying conditions Follow repeat labs in the morning  Mild hypercalcemia in the setting of teriparatide use Serum calcium  10.6 No reported symptoms of hypercalcemia Continue IV fluid hydration  Anemia of chronic disease in the setting of CKD Follows with Dr. Sherrod, hematology Has had iron infusions in the past Hemoglobin is at her baseline, 10.8. Dr. Sherrod added to the treatment team  Severe osteoporosis Resume home teriparatide  Severe protein calorie malnutrition Severe muscle mass loss BMI 16 and serum albumin 3.0 Encourage increase oral protein calorie intake Liberalize diet Dietitian consulted  Crohn's disease with ileostomy Follows with GI at [facility] Dr. Dudley Bradley [phone] Patient requests that Dr. Dudley be contacted in the morning.  Cluster of nodules in the right lower lung Former smoker, quit more than 50 years ago Follows with [facility], Dr. Neysa. Has an upcoming appointment with Dr. Neysa on Thursday, 01/18/2024. Dr. Neysa added to the treatment team.  Glaucoma Resume home regimen  Generalized weakness, multifactorial in the setting of all of the above PT OT evaluation Fall precautions  Goals of care Palliative care team consulted to assist with goals of care discussions The patient wishes to be DNR  Grief Husband deceased 12-15-2022 Not suicidal.  Has declined antidepressants.    Critical care time: 65 minutes.    DVT  prophylaxis: Subcu heparin  3 times daily  Code Status: DNR, per daughter and son at bedside.  Family Communication: Daughter and son at bedside.  Disposition Plan: Admitted to telemetry unit.  Consults called: Dietitian consulted and palliative care team.  Admission status: Inpatient status.   Status is: Inpatient The patient requires at least 2 midnights for further evaluation and treatment of present condition.   Terry LOISE Hurst MD [facility] Pager [phone]  If 7PM-7AM, please contact night-coverage [email or web address] Password [facility]  01/14/2024, 7:28 PM

## 2024-01-14 NOTE — ED Provider Notes (Signed)
 [place] EMERGENCY DEPARTMENT AT [facility] Provider Note   CSN: [id number] Arrival date & time: 01/14/24  1427     Patient presents with: Weakness and Fever   Kim [name] is a 83 y.o. female.  {Add pertinent medical, surgical, social history, OB history to HPI:32947}  Weakness Associated symptoms: fever   Fever    Patient has a history of osteoporosis arthritis Crohn's disease protein calorie Myre nutrition, Mycobacterium infection, thrombocytopenia, chronic kidney disease, dehydration.  Family states patient's health has been doing well any over the summer.  She has had multiple bouts of dehydration.  Family states patient has gradually been getting weaker recently.  She has not been eating or drinking much.  She started having a fever today up to 101.  Patient has been coughing.  Family states that she had a CT scan recently and was told there could be pneumonia.  Per the family she was not started on any medications and the plan was to have her follow-up with her lung doctor about this. 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  It showed extensive heterogeneous reticular nodular infiltrates.  Patient was going to follow-up with her pulmonologist regarding this  Prior to Admission medications   Medication Sig Start Date End Date Taking? Authorizing Provider  Abaloparatide  (TYMLOS ) 3120 MCG/1.56ML SOPN Inject 1.56 mLs into the skin at bedtime.    [provider]  acetaminophen  (TYLENOL ) 500 MG tablet Take 1,000 mg by mouth every morning.    [provider]  B-D 3CC LUER-LOK SYR 22GX1-1/2 22G X 1-1/2 3 ML MISC USE AS DIRECTED 12/31/21   Geofm Glade PARAS, MD  B-D 3CC LUER-LOK SYR 25GX5/8 25G X 5/8 3 ML MISC USE AS DIRECTED. 02/07/23   Geofm Glade PARAS, MD  brimonidine  (ALPHAGAN ) 0.2 % ophthalmic solution Place 1 drop into both eyes 3 (three) times daily. 04/01/21    [provider]  Calcium  Citrate-Vitamin D  200-125 MG-UNIT TABS Take 1-2 tablets by mouth See admin instructions. Take 2 tab in the morning and 1 tab at bedtime    [provider]  Cholecalciferol  (VITAMIN D3) 50 MCG (2000 UT) TABS Take 2,000 Units by mouth daily.    [provider]  cyanocobalamin  (VITAMIN B12) 1000 MCG/ML injection INJECT 1 ML IM ONCE A MONTH. 02/07/23   Burns, Glade PARAS, MD  dorzolamide -timolol  (COSOPT ) 2-0.5 % ophthalmic solution Place 1 drop into both eyes 2 (two) times daily. 09/20/22   [provider]  Fluocinolone Acetonide Body 0.01 % OIL Apply topically. 01/19/23   [provider]  gabapentin  (NEURONTIN ) 300 MG capsule Take 600 mg by mouth at bedtime. 08/09/13   [provider]  loratadine (CLARITIN) 10 MG tablet Take 10 mg by mouth daily. [place]    [provider]  Melatonin 10 MG TBCR Take 10 mg by mouth at bedtime.    [provider]  Multiple Vitamins-Minerals (ONE-A-DAY WOMENS 50+ ADVANTAGE) TABS Take 1 tablet by mouth daily.    [provider]  ROCKLATAN  0.02-0.005 % SOLN Place 1 drop into both eyes daily. 09/16/21   [provider]  Teriparatide 600 MCG/2.4ML SOPN 20mcg Subcutaneous once a day for 30 days 11/29/22   [provider]  triamcinolone cream (KENALOG) 0.1 % Apply 1 application  topically every other day.    [provider]    Allergies: Antihistamines, chlorpheniramine-type; Chlorpheniramine; Hydrocodone -acetaminophen ; Metronidazole; Other;  Sulfa antibiotics; Tape; Wound dressing adhesive; and Chlorhexidine     Review of Systems  Constitutional:  Positive for fever.  Neurological:  Positive for weakness.    Updated Vital Signs BP 135/62 (BP Location: Left Arm)   Pulse 88   Temp (!) 100.6 F (38.1 C) (Oral)   Resp 15   Ht 1.422 m (4' 8)   Wt 33.6 kg   SpO2 93%   BMI 16.59 kg/m   Physical Exam Vitals and nursing note reviewed.   Constitutional:      Appearance: She is well-developed. She is ill-appearing.     Comments: Underweight  HENT:     Head: Normocephalic and atraumatic.     Right Ear: External ear normal.     Left Ear: External ear normal.     Mouth/Throat:     Mouth: Mucous membranes are dry.  Eyes:     General: No scleral icterus.    Conjunctiva/sclera: Conjunctivae normal.  Neck:     Trachea: No tracheal deviation.  Cardiovascular:     Rate and Rhythm: Normal rate and regular rhythm.  Pulmonary:     Effort: Pulmonary effort is normal. No respiratory distress.     Breath sounds: No stridor. Rhonchi present. No wheezing or rales.     Comments: Occasional coughing Abdominal:     General: Bowel sounds are normal. There is no distension.     Palpations: Abdomen is soft.     Tenderness: There is no abdominal tenderness. There is no guarding or rebound.     Comments: Colostomy in place  Musculoskeletal:        General: No tenderness or deformity.     Cervical back: Neck supple.  Skin:    General: Skin is warm and dry.     Findings: No rash.  Neurological:     General: No focal deficit present.     Mental Status: She is alert.     Cranial Nerves: No cranial nerve deficit, dysarthria or facial asymmetry.     Sensory: No sensory deficit.     Motor: Weakness present. No abnormal muscle tone or seizure activity.     Coordination: Coordination normal.     Comments: Generalized weakness  Psychiatric:        Mood and Affect: Mood normal.     (all labs ordered are listed, but only abnormal results are displayed) Labs Reviewed  COMPREHENSIVE METABOLIC PANEL WITH GFR - Abnormal; Notable for the following components:      Result Value   CO2 14 (*)    Glucose, Bld 111 (*)    BUN 67 (*)    Creatinine, Ser 3.22 (*)    Calcium  10.6 (*)    Albumin 3.0 (*)    GFR, Estimated 14 (*)    All other components within normal limits  CBC - Abnormal; Notable for the following components:   RBC 3.55 (*)     Hemoglobin 10.8 (*)    HCT 34.5 (*)    All other components within normal limits  CULTURE, BLOOD (ROUTINE X 2)  CULTURE, BLOOD (ROUTINE X 2)  LIPASE, BLOOD  URINALYSIS, W/ REFLEX TO CULTURE (INFECTION SUSPECTED)  I-STAT CG4 LACTIC ACID, ED  CBG MONITORING, ED  I-STAT CG4 LACTIC ACID, ED    EKG: None  Radiology: DG Chest Portable 1 View Result Date: 01/14/2024 CLINICAL DATA:  Cough and weakness EXAM: PORTABLE CHEST 1 VIEW COMPARISON:  Chest x-ray 12/26/2022.  CT of the chest 03/17/2023. FINDINGS: There scattered interstitial opacities  throughout the entire right lung which have increased from prior. Cluster of nodules in the right lower lung again seen. Bronchiectasis in the right upper lobe is again seen. The left lung appears clear. There is blunting of the right costophrenic angle, a new finding. There is no pneumothorax. The cardiomediastinal silhouette is within normal limits. No acute fractures are identified. IMPRESSION: 1. Increased interstitial opacities throughout the right lung with new blunting of the right costophrenic angle. Findings are concerning for infection. 2. Stable cluster of nodules in the right lower lung. 3. Questionable trace right pleural effusion. Electronically Signed   By: Greig Pique M.D.   On: 01/14/2024 15:58    {Document cardiac monitor, telemetry assessment procedure when appropriate:32947} Procedures   Medications Ordered in the ED  cefTRIAXone  (ROCEPHIN ) 1 g in sodium chloride  0.9 % 100 mL IVPB (has no administration in time range)  azithromycin  (ZITHROMAX ) 500 mg in sodium chloride  0.9 % 250 mL IVPB (has no administration in time range)  lactated ringers  bolus 500 mL (500 mLs Intravenous New Bag/Given 01/14/24 1825)    Clinical Course as of 01/14/24 1901  Sun Jan 14, 2024  1545 Notified that pt refused EKG [JK]  1822 CBC(!) CBC shows normal hemoglobin [JK]  1822 Comprehensive metabolic panel(!) Metabolic panel shows dehydration worsening renal  function [JK]  1822 I-Stat CG4 Lactic Acid Lactic acid level normal. [JK]  1822 Chest x-ray shows increasing interstitial opacities.  Concerning for infection [JK]    Clinical Course User Index [JK] Randol Simmonds, MD   {Click here for ABCD2, HEART and other calculators REFRESH Note before signing:1}                              Medical Decision Making Problems Addressed: AKI (acute kidney injury) [facility]): acute illness or injury that poses a threat to life or bodily functions Dehydration: acute illness or injury that poses a threat to life or bodily functions Pneumonia due to infectious organism, unspecified laterality, unspecified part of lung: acute illness or injury that poses a threat to life or bodily functions Protein-calorie malnutrition, unspecified severity (HCC): chronic illness or injury that poses a threat to life or bodily functions  Amount and/or Complexity of Data Reviewed Labs: ordered. Decision-making details documented in ED Course. Radiology: ordered.   Patient presents to the ED for evaluation of decreased p.o. intake.  Patient also recently started having fevers.  She has been coughing.  Patient has been having recurrent issues with dehydration and poor appetite.  Patient has been evaluated by her primary doctor and GI doctor she has had recurrent hospitalization.  Patient's labs do show acute kidney injury with elevated BUN and creatinine decreased bicarb.  Patient also has a low-grade temperature here.  She has been coughing.  Chest x-ray does show the possibility of pneumonia.  No lactic acidosis or hypotension at this time.  Will start the patient on IV antibiotics and IV fluids.  I will consult the medical service for admission {Document critical care time when appropriate  Document review of labs and clinical decision tools ie CHADS2VASC2, etc  Document your independent review of radiology images and any outside records  Document your discussion with family  members, caretakers and with consultants  Document social determinants of health affecting pt's care  Document your decision making why or why not admission, treatments were needed:32947:::1}   Final diagnoses:  Dehydration  AKI (acute kidney injury) (HCC)  Pneumonia due  to infectious organism, unspecified laterality, unspecified part of lung    ED Discharge Orders     None

## 2024-01-14 NOTE — ED Notes (Signed)
 Pt refused the EKG informed the Dr Randol and the paramedic

## 2024-01-14 NOTE — ED Triage Notes (Signed)
 Pt bib EMS from home. Pts family called to have pt brought for dehydration and potential infection. Pt family states pt has been running a fever and weak but has not taken tylenol .Pt also not eating or drinking per family. Denies N/V/D. Pt does not want to be here. A&O x3  T 100.5 around an hour ago BP 108/62 CBG 114 HR 80 RR 18 95 O2

## 2024-01-15 ENCOUNTER — Inpatient Hospital Stay: Payer: Self-pay | Admitting: Internal Medicine

## 2024-01-15 ENCOUNTER — Inpatient Hospital Stay: Payer: Self-pay

## 2024-01-15 ENCOUNTER — Telehealth: Payer: Self-pay | Admitting: Medical Oncology

## 2024-01-15 DIAGNOSIS — N179 Acute kidney failure, unspecified: Secondary | ICD-10-CM | POA: Diagnosis not present

## 2024-01-15 LAB — BASIC METABOLIC PANEL WITH GFR
Anion gap: 13 (ref 5–15)
BUN: 53 mg/dL — ABNORMAL HIGH (ref 8–23)
CO2: 12 mmol/L — ABNORMAL LOW (ref 22–32)
Calcium: 9 mg/dL (ref 8.9–10.3)
Chloride: 111 mmol/L (ref 98–111)
Creatinine, Ser: 2.52 mg/dL — ABNORMAL HIGH (ref 0.44–1.00)
GFR, Estimated: 19 mL/min — ABNORMAL LOW (ref >60)
Glucose, Bld: 98 mg/dL (ref 70–99)
Potassium: 3.5 mmol/L (ref 3.5–5.1)
Sodium: 136 mmol/L (ref 135–145)

## 2024-01-15 LAB — MAGNESIUM: Magnesium: 1.3 mg/dL — ABNORMAL LOW (ref 1.7–2.4)

## 2024-01-15 LAB — CBC
HCT: 26.6 % — ABNORMAL LOW (ref 36.0–46.0)
Hemoglobin: 8 g/dL — ABNORMAL LOW (ref 12.0–15.0)
MCH: 29.2 pg (ref 26.0–34.0)
MCHC: 30.1 g/dL (ref 30.0–36.0)
MCV: 91.1 fL (ref 80.0–100.0)
Platelets: 205 K/uL (ref 150–400)
RBC: 2.74 MIL/uL — ABNORMAL LOW (ref 3.87–5.11)
RDW: 14.3 % (ref 11.5–15.5)
WBC: 8.9 K/uL (ref 4.0–10.5)
nRBC: 0 % (ref 0.0–0.2)

## 2024-01-15 LAB — PROCALCITONIN: Procalcitonin: 4.28 ng/mL

## 2024-01-15 LAB — PHOSPHORUS: Phosphorus: 2.9 mg/dL (ref 2.5–4.6)

## 2024-01-15 MED ORDER — LOPERAMIDE HCL 2 MG PO CAPS: 2.0000 mg | ORAL_CAPSULE | ORAL | Status: DC | PRN

## 2024-01-15 MED ORDER — SODIUM CHLORIDE 0.45 % IV SOLN
INTRAVENOUS | Status: DC
  Filled 2024-01-15 (×6): qty 75

## 2024-01-15 MED ORDER — ABALOPARATIDE 3120 MCG/1.56ML ~~LOC~~ SOPN
80.0000 ug | PEN_INJECTOR | Freq: Every day | SUBCUTANEOUS | Status: DC
  Administered 2024-01-15 – 2024-01-16 (×4): 80 ug via SUBCUTANEOUS

## 2024-01-15 MED ORDER — MAGNESIUM SULFATE 2 GM/50ML IV SOLN
2.0000 g | Freq: Once | INTRAVENOUS | Status: AC
  Administered 2024-01-15 (×2): 2 g via INTRAVENOUS
  Filled 2024-01-15: qty 50

## 2024-01-15 NOTE — Evaluation (Addendum)
 Occupational Therapy Evaluation Patient Details Name: Kim [name] MRN: [id number] DOB: [birth year] Today's Date: 01/15/2024   History of Present Illness   83 yo  comes to ED with weakness, productive cough, subjective fever. Chest x-ray revealed right-sided infiltrates suggestive of pneumonia PMH:s/pL L IM nail , osteoporosis, anemia, colostomy, Crohn disease.     Clinical Impressions Patient is currently requiring as high as moderate assistance with basic ADLs, as well as  Stand by assist with bed mobility with significant increased time and effort to come to EOB, and CG assist with functional transfers to recliner with use of RW.   Current level of function is below patient's typical baseline.    During this evaluation, patient was limited by severe cognitive impairment requiring significantly increased time for ADLs due to need of reassurance, redirection, repetition, and cues for safety and sequencing, agitation which resolved once OT set boundaries with kindness,  generalized weakness, impaired activity tolerance, and frailty with redness down pt's spine, and lacerations to face which pt reports is from a fall, all of which has the potential to impact patient's and/or caregivers' safety and independence during functional mobility, as well as performance for ADLs.    Patient lives with alone at [facility] and has a son who  able to provide PRN supervision and assistance.  Patient demonstrates fair rehab potential, and should benefit from continued skilled occupational therapy services while in acute care to maximize safety, independence and quality of life at home.  Continued occupational therapy services after discharge from acute care from continued inpatient follow up therapy, <3 hours/day is recommended.   ?      If plan is discharge home, recommend the following:   A little help with walking and/or transfers;A little help with bathing/dressing/bathroom;Assistance with  cooking/housework;Supervision due to cognitive status;Assist for transportation;Help with stairs or ramp for entrance     Functional Status Assessment   Patient has had a recent decline in their functional status and demonstrates the ability to make significant improvements in function in a reasonable and predictable amount of time.     Equipment Recommendations    (defer)     Recommendations for Other Services   PT consult     Precautions/Restrictions   Precautions Precautions: Fall Recall of Precautions/Restrictions: Impaired Precaution/Restrictions Comments: Fragile skin. Aspiration Restrictions Weight Bearing Restrictions Per Provider Order: No     Mobility Bed Mobility Overal bed mobility: Needs Assistance Bed Mobility: Supine to Sit     Supine to sit: Supervision, Used rails, HOB elevated     General bed mobility comments: Significant increased time and pt refusing assistance.    Transfers                          Balance Overall balance assessment: Needs assistance Sitting-balance support: Feet supported, No upper extremity supported Sitting balance-Leahy Scale: Fair     Standing balance support: During functional activity Standing balance-Leahy Scale: Fair                             ADL either performed or assessed with clinical judgement   ADL Overall ADL's : Needs assistance/impaired Eating/Feeding: Set up;Cueing for sequencing Eating/Feeding Details (indicate cue type and reason): Cuing to ID things on plate. Example, pt opened jam and asked if it was a salve.  Dropping things, smearing jam on hands, ignoring much of her food.  Grooming: Wash/dry hands;Wash/dry face;Sitting;Contact  guard assist;Cueing for sequencing Grooming Details (indicate cue type and reason): Pt seated in recliner and cued to washed hands and face with setup and cues to initiate. Upper Body Bathing: Contact guard assist;Sitting;Set up;Cueing for  sequencing;Cueing for safety   Lower Body Bathing: Minimal assistance;Cueing for compensatory techniques;Cueing for sequencing;Cueing for safety;Sitting/lateral leans;Sit to/from stand   Upper Body Dressing : Minimal assistance;Sitting;Cueing for sequencing   Lower Body Dressing: Minimal assistance;Sit to/from stand;Sitting/lateral leans;Cueing for sequencing;Cueing for safety;Cueing for compensatory techniques   Toilet Transfer: Contact guard assist;Rolling walker (2 wheels) Toilet Transfer Details (indicate cue type and reason): Pt stood from EOB slowly stating, don't help me. Up with CGA, increased time/effort to RW.  Pt performed stand-step to recliner with RW and CGA. Cues for safety before sitting. Pt stood from recliner x 2 for ADLs needs with SBA and cues. Toileting- Clothing Manipulation and Hygiene: Minimal assistance;Sit to/from stand;Cueing for sequencing;Cueing for safety;Cueing for compensatory techniques       Functional mobility during ADLs: Contact guard assist;Cueing for safety;Rolling walker (2 wheels);Cueing for sequencing General ADL Comments: All ADLs and mobility took significant longer time due to pt's confusion, need of frequent repetition and encouraging/reassuring pt while reorienting her often due to pt's loss of STM.     Vision Ability to See in Adequate Light: 1 Impaired Additional Comments: Eyes encrusted but pt  gives adequate visual attention. Does misidentify some items but unsure if this is cognition or vision. Glaucoma and cataracts. Uses eye drops with a timer.      Perception         Praxis         Pertinent Vitals/Pain Pain Assessment Pain Assessment: Faces Faces Pain Scale: No hurt     Extremity/Trunk Assessment Upper Extremity Assessment Upper Extremity Assessment: Generalized weakness   Lower Extremity Assessment Lower Extremity Assessment: Defer to PT evaluation   Cervical / Trunk Assessment Cervical / Trunk Assessment:  Kyphotic;Other exceptions Cervical / Trunk Exceptions: Noted red area along mid-spine. Pt agreed to picture which was taken and shown to MD while MD in room.   Communication Communication Communication: No apparent difficulties   Cognition Arousal: Alert Behavior During Therapy: Anxious, Agitated, Lability Cognition: Cognition impaired, No family/caregiver present to determine baseline   Orientation impairments: Place, Time, Situation Awareness: Intellectual awareness impaired, Online awareness impaired Memory impairment (select all impairments): Short-term memory, Working [employer] fast streamer, Engineer, structural memory Attention impairment (select first level of impairment): Sustained attention Executive functioning impairment (select all impairments): Initiation, Organization, Sequencing, Reasoning, Problem solving                   Following commands: Impaired Following commands impaired: Only follows one step commands consistently, Follows one step commands with increased time (frequent reptition needed)     Cueing  General Comments   Cueing Techniques: Verbal cues;Tactile cues;Visual cues      Exercises     Shoulder Instructions      Home Living Family/patient expects to be discharged to:: Private residence (ILF) Living Arrangements: Alone Available Help at Discharge: Available PRN/intermittently (Son and staff at [facility]) Type of Home: Independent living facility       Home Layout: One level     Bathroom Shower/Tub:  (Per facility. Pt too confused today to provide information other than living at [facility].)         Home Equipment: [place] - single point          Prior Functioning/Environment Prior Level of Function : Needs  assist (Pt unable to recall any falls but with significant memory deficits.)       Physical Assist : ADLs (physical)   ADLs (physical): IADLs Mobility Comments: Pt reports ambulating with her cane PRN ADLs Comments: Pt has a  housekeeping service ~2x/month but makes her own meals.    OT Problem List: Decreased strength;Decreased knowledge of use of DME or AE;Decreased activity tolerance;Decreased cognition;Impaired balance (sitting and/or standing);Decreased safety awareness   OT Treatment/Interventions: Self-care/ADL training;Balance training;Therapeutic activities;DME and/or AE instruction;Visual/perceptual remediation/compensation;Cognitive remediation/compensation;Patient/family education;Therapeutic exercise      OT Goals(Current goals can be found in the care plan section)   Acute Rehab OT Goals Patient Stated Goal: Pt repeating, I want to go home. OT Goal Formulation: With patient Time For Goal Achievement: 01/29/24 Potential to Achieve Goals: Fair ADL Goals Pt Will Perform Eating: with modified independence;sitting (agree to at least 2 meals OOB a day) Pt Will Perform Grooming: standing;with modified independence Pt Will Perform Lower Body Dressing: with modified independence;sit to/from stand;sitting/lateral leans Pt Will Transfer to Toilet: with modified independence;ambulating Pt Will Perform Toileting - Clothing Manipulation and hygiene: with modified independence Pt/caregiver will Perform Home Exercise Program: Increased strength;Both right and left upper extremity;With Supervision Additional ADL Goal #1: Pt will demonstrate improved mentation by scoring <4/10 on short blessed test and answering 4/4 safety questions from the [facility] correctly:   1. What do you do for yourself if you are sick with a cold.    2. What do you do if you burn yourself and the wound becomes infected.    3. What do you do if you experience severe chest pain and shortness of breath?   4. What number do you call in an emergency?   OT Frequency:  Min 2X/week    Co-evaluation              AM-PAC OT 6 Clicks Daily Activity     Outcome Measure Help from another person eating meals?: A Little Help from another person  taking care of personal grooming?: A Little Help from another person toileting, which includes using toliet, bedpan, or urinal?: A Little Help from another person bathing (including washing, rinsing, drying)?: A Little Help from another person to put on and taking off regular upper body clothing?: A Little Help from another person to put on and taking off regular lower body clothing?: A Lot 6 Click Score: 17   End of Session Equipment Utilized During Treatment: Gait belt;Rolling walker (2 wheels);Oxygen Nurse Communication: Mobility status (Behavior)  Activity Tolerance: Patient tolerated treatment well Patient left: in chair;with call bell/phone within reach;with chair alarm set;with nursing/sitter in room  OT Visit Diagnosis: Unsteadiness on feet (R26.81);Other symptoms and signs involving cognitive function;Muscle weakness (generalized) (M62.81)                Time: 9159-9047 OT Time Calculation (min): 72 min Charges:  OT General Charges $OT Visit: 1 Visit OT Evaluation $OT Eval Moderate Complexity: 1 Mod OT Treatments $Self Care/Home Management : 23-37 mins $Therapeutic Activity: 8-22 mins $Cognitive Funtion inital: Initial 15 mins  Delon, OT Acute Rehab Services Office: [phone] 01/15/2024   Delon Falter 01/15/2024, 12:34 PM

## 2024-01-15 NOTE — Discharge Instructions (Addendum)
 Tips to Prevent Dehydration Drink small amounts of fluids as often as you can. You should try to drink a total of about 8 to 10 cups of liquids a day. (You may need more if you have diarrhea or have recently vomited.) Use different cues to remind you to drink, such as setting a timer.  Ask family and friends to encourage or remind you to drink more fluids. Keep a water bottle with you during the day and sip frequently. Keep water beside your bed at night. Sip on liquids with meals. In addition to water, try flavored waters, tea, milks, sports drinks, fruit juices, soft drinks, and other beverages.  Eat foods that have a lot of fluids, like fresh fruits, bouillon, broth-based soups, gelatin, fruit ices, popsicles, ice cream, sorbet, sherbet, milkshakes, frozen yogurt, and high-calorie, high-protein nutritional drinks. Avoid alcoholic drinks    During times of higher output (1,800 milliliters per day or more) or heavy sweating, you need to drink more fluids. You may need to actually measure how much you are drinking and your output from your ostomy when it is high:   1 ounce is 30 milliliters o 1 cup is 8 ounces  8 cups is 64 ounces or 2 quarts or 2 liters  Watch for signs and symptoms of fluid-electrolyte imbalance. If symptoms occur, seek treatment right away. Symptoms include:   Dry mouth  Reduced urine output (not as much urine or urinating less often than normal)  Dark-colored urine  Feeling dizzy when you stand up  Noticeable fatigue (feeling extremely tired)  Abdominal cramping  If you have a high output ostomy, you may need to use an oral rehydration solution (ORS) to replace fluid loss. The World Health Organization has a solution in powder form you can buy (called Oral Rehydration Salts). Some sports drinks can increase stoma output, so pediatric electrolyte solutions, such as Pedialyte, are recommended instead. A less expensive option is to make the oral rehydration solution yourself  using one of the following recipes:   2 cups Gatorade + 2 cups water +  teaspoon salt  3 cups water + 1 cup orange juice +  teaspoon salt +  teaspoon baking soda o  cup grape juice or cranberry juice + 3 cups water +  teaspoon salt o 1 cup apple juice + 3 cups water +  teaspoon salt  4 cups (1 liter) water +  teaspoon table salt + 6 level teaspoons sugar (World Health Organization's ORS recipe)  Your registered dietitian nutritionist or doctor may suggest increasing foods that are higher in sodium and potassium. Remember to choose lower fiber foods that are high in potassium. Some examples of high-potassium foods include soy milk, yogurt, cottage cheese, potatoes without skin, liquid supplements (such as Ensure Muscle Health), orange juice or tomato juice (if these do not cause reflux or other symptoms), smooth peanut butter, and baked or broiled salmon or [place]. Salt substitute that contains potassium chloride  can also be used, but be careful not to overuse it. Higher-sodium foods added to the diet should also be lower fiber and not fried.

## 2024-01-15 NOTE — Progress Notes (Signed)
 PT Cancellation Note  Patient Details Name: Stephane Junkins MRN: [id number] DOB: Jun 29, 1940   Cancelled Treatment:    Reason Eval/Treat Not Completed: Fatigue/lethargy limiting ability to participate Patient has returned to bed and is fatigued. Will check back tomorrow. [name] PT Acute Rehabilitation Services Office [phone]   [name] Norris 01/15/2024, 3:05 PM

## 2024-01-15 NOTE — Progress Notes (Signed)
 PROGRESS NOTE  Kim [name]  DOB: November 30, 1940  PCP: Geofm Glade PARAS, MD FMW:[id number]  DOA: 01/14/2024  LOS: 1 day  Hospital Day: 2  Brief narrative: Kim [name] is a 83 y.o. female with PMH significant for Crohn's disease with ileostomy, CKD, osteoporosis, osteoarthritis, chronic anemia, former smoker. 8/10, patient presented to the ED from [facility] with complaint of generalized weakness productive cough, fever for 2 days. Patient lost her husband 11/27/22 and since then has had a decrease in her appetite and at least 20 lbs unintentional weight loss.  Also reports intermittent trouble swallowing.   She had had 2 hospitalizations at [facility] for dehydration this year.    In the ED, patient had a temperature of 100.6, hemodynamically stable Labs showed WC count 9.4, hemoglobin 10.8, lactic acid normal, procalcitonin elevated to 4.28, BUN/creatinine elevated to 67/3.22 Urinalysis with hazy yellow color urine, large leukocytes Chest x-ray showed increased interstitial opacities throughout the right lung with new blunting of the right costophrenic angle. Findings are concerning for infection. Urine culture and blood culture sent Started on empiric antibiotic Admitted to [facility]  Subjective: Patient was seen and examined this morning. Pleasant elderly very thin built female.  Sitting up in recliner.  Looks dry overall with crusted mouth and eyes Ostomy bag with liquid output Overnight no fever Labs this morning with creatinine down to 2.52  Assessment and plan: Right-sided community-acquired pneumonia, POA Presented with shortness of breath, cough, fever  Had 1 episode of fever in the ED Chest x-ray with right sided infiltrates.  WBC count normal but procalcitonin level was elevated Currently on IV Rocephin  and azithromycin .   Continue aspiration precautions.  Speech eval Continue PRN antitussives and bronchodilators Recent Labs  Lab 01/14/24 1540 01/14/24 1711  01/15/24 0510 01/15/24 0706  WBC 9.4  --   --  8.9  LATICACIDVEN  --  1.8  --   --   PROCALCITON  --   --  4.28  --    AKI on CKD 4 Severe metabolic acidosis Baseline creatinine 2.41 from January 2025 Presented with creatinine elevated at 3.22 and serum bicarb of 14 secondary to dehydration  Creatinine is gradually improving with IV fluid.  Serum bicarb level however is further down today to 12 Switch IV fluids from LR to bicarb drip today.  Recent Labs    01/23/23 1547 06/21/23 1114 01/14/24 1540 01/15/24 0510  BUN 37* 92* 67* 53*  CREATININE 1.64* 2.41* 3.22* 2.52*  CO2 26 22 14* 12*   Mild hypercalcemia Presented with calcium  level elevated to 10.6 in the setting of teriparatide use No reported symptoms of hypercalcemia Improving with IV fluid   Crohn's disease with ileostomy Follows with GI at [facility], Dr. Dudley Bradley [phone]   Anemia of chronic disease in the setting of CKD Follows with Dr. Sherrod, hematology Has had iron infusions in the past Hemoglobin is at her baseline, 10.8. Recent Labs    01/23/23 1547 06/21/23 1113 06/21/23 1114 01/14/24 1540 01/15/24 0706  HGB 10.3*  --  10.8* 10.8* 8.0*  MCV 97.1  --  96.4 97.2 91.1  VITAMINB12  --   --  1,780*  --   --   FOLATE  --  >40.0  --   --   --   FERRITIN 892.5*  --  961*  --   --   TIBC 245.0*  --  241*  --   --   IRON 55  --  67  --   --  Severe osteoporosis Continue home teriparatide   Severe protein calorie malnutrition Severe muscle mass loss BMI 16 and serum albumin 3.0 Encourage increase oral protein calorie intake Liberalize diet Dietitian consulted   Cluster of nodules in the right lower lung Former smoker, quit more than 50 years ago Follows with [facility], Dr. Neysa. Has an upcoming appointment with Dr. Neysa on Thursday, 01/18/2024.   Glaucoma Continue home regimen   Grief Husband deceased 2022/12/12 Not suicidal.  Has declined antidepressants.    Generalized weakness, multifactorial in the setting of all of the above PT OT evaluation Fall precautions     Mobility:  PT Orders: Active   PT Follow up Rec:    Goals of care   Code Status: Limited: Do not attempt resuscitation (DNR) -DNR-LIMITED -Do Not Intubate/DNI      DVT prophylaxis:  heparin  injection 5,000 Units Start: 01/14/24 2200   Antimicrobials: IV Rocephin , azithromycin  Fluid: Bicarb drip at 75 mL/h Consultants: None Family Communication: None at bedside  Status: Inpatient Level of care:  Telemetry   Patient is from: [facility] Needs to continue in-hospital care: Needs IV antibiotics, therapy, IV hydration Anticipated d/c to: Pending clinical course      Diet:  Diet Order             Diet regular Room service appropriate? Yes; Fluid consistency: Thin  Diet effective now                   Scheduled Meds:  brimonidine   1 drop Both Eyes TID   dorzolamide -timolol   1 drop Both Eyes BID   feeding supplement  1 Container Oral TID BM   heparin   5,000 Units Subcutaneous Q8H   multivitamin with minerals  1 tablet Oral Daily   Netarsudil -Latanoprost   1 drop Ophthalmic QHS    PRN meds: acetaminophen , guaiFENesin , ipratropium-albuterol , loperamide , melatonin, polyethylene glycol, prochlorperazine    Infusions:   azithromycin  500 mg (01/15/24 1001)   cefTRIAXone  (ROCEPHIN )  IV     magnesium  sulfate bolus IVPB     sodium bicarbonate  75 mEq in sodium chloride  0.45 % 1,075 mL infusion      Antimicrobials: Anti-infectives (From admission, onward)    Start     Dose/Rate Route Frequency Ordered Stop   01/15/24 1000  cefTRIAXone  (ROCEPHIN ) 2 g in sodium chloride  0.9 % 100 mL IVPB        2 g 200 mL/hr over 30 Minutes Intravenous Every 24 hours 01/14/24 1948     01/15/24 1000  azithromycin  (ZITHROMAX ) 500 mg in sodium chloride  0.9 % 250 mL IVPB        500 mg 250 mL/hr over 60 Minutes Intravenous Every 24 hours 01/14/24 1948     01/14/24 1900   cefTRIAXone  (ROCEPHIN ) 1 g in sodium chloride  0.9 % 100 mL IVPB        1 g 200 mL/hr over 30 Minutes Intravenous  Once 01/14/24 1858 01/14/24 2005   01/14/24 1900  azithromycin  (ZITHROMAX ) 500 mg in sodium chloride  0.9 % 250 mL IVPB        500 mg 250 mL/hr over 60 Minutes Intravenous  Once 01/14/24 1858 01/14/24 2110       Objective: Vitals:   01/15/24 0119 01/15/24 0509  BP:  (!) 109/43  Pulse:  80  Resp:  20  Temp:  99.3 F (37.4 C)  SpO2: 95% 96%    Intake/Output Summary (Last 24 hours) at 01/15/2024 1102 Last data filed at 01/15/2024 9347 Gross per 24 hour  Intake 825 ml  Output 350 ml  Net 475 ml   Filed Weights   01/14/24 1448 01/14/24 2229  Weight: 33.6 kg 34.6 kg   Weight change:  Body mass index is 17.08 kg/m.   Physical Exam: General exam: Pleasant, thin built elderly Caucasian female Skin: No rashes, lesions or ulcers.  Overall dry.  Has crusting of the lips HEENT: Atraumatic, normocephalic, no obvious bleeding Lungs: Mild diminished air entry in both bases. CVS: S1, S2, no murmur,   GI/Abd: Soft, nontender, nondistended, bowel sound present,   CNS: Alert, awake, oriented x 3 Psychiatry: Sad affect Extremities: No pedal edema, no calf tenderness,   Data Review: I have personally reviewed the laboratory data and studies available.  F/u labs ordered Unresulted Labs (From admission, onward)     Start     Ordered   01/16/24 0500  CBC with Differential/Platelet  Daily,   R      01/15/24 1101   01/16/24 0500  Basic metabolic panel with GFR  Daily,   R      01/15/24 1101   01/14/24 2132  Urine Culture  Once,   R        01/14/24 2132   01/14/24 1523  Blood culture (routine x 2)  BLOOD CULTURE X 2,   R      01/14/24 1522            Signed, Chapman Rota, MD [facility] 01/15/2024

## 2024-01-15 NOTE — Progress Notes (Signed)
 PT Cancellation Note  Patient Details Name: Kim [name] MRN: [id number] DOB: 04-15-1941   Cancelled Treatment:    Reason Eval/Treat Not Completed: Other (comment) Up with OT, PT will check back when ready to return to bed. [name] PT Acute Rehabilitation Services Office [phone]  [name] Norris 01/15/2024, 11:00 AM

## 2024-01-15 NOTE — TOC Initial Note (Addendum)
 Transition of Care [facility]) - Initial/Assessment Note    Patient Details  Name: Kim [name] MRN: [id number] Date of Birth: 05/20/1941  Transition of Care [facility]) CM/SW Contact:    Heather DELENA Saltness, LCSW Phone Number: 01/15/2024, 3:35 PM  Clinical Narrative:                 CSW met with pt and pt's son, Demetria Iwai and daughter, Jaelynne Hockley, to discuss discharge planning. Pt currently resides at [facility]. Pt and family requesting assistance with setting up [facility] services. Pt reports specifically not wanting to use HH agency through [place]. CSW sent message to [place] at [employer] to inquire about [facility]. [place] reports [employer] with [employer] and [employer]. CSW will set up [facility] services with non-contracted agency, per pt's request, pending PT evaluation.  CSW will continue to follow and assist with discharge planning.    Expected Discharge Plan: Home w Home Health Services Barriers to Discharge: Continued Medical Work up   Patient Goals and CMS Choice Patient states their goals for this hospitalization and ongoing recovery are:: To return to [facility]       Expected Discharge Plan and Services In-house Referral: Clinical Social Work Discharge Planning Services: NA Post Acute Care Choice: Home Health Living arrangements for the past 2 months: Independent Living Facility                 DME Arranged: N/A DME Agency: NA       HH Arranged: PT, OT          Prior Living Arrangements/Services Living arrangements for the past 2 months: Independent Living Facility Lives with:: Self Patient language and need for interpreter reviewed:: Yes Do you feel safe going back to the place where you live?: Yes      Need for Family Participation in Patient Care: Yes (Comment) Care giver support system in place?: No (comment)   Criminal Activity/Legal Involvement Pertinent to Current Situation/Hospitalization: No - Comment as  needed  Activities of Daily Living   ADL Screening (condition at time of admission) Independently performs ADLs?: No Does the patient have a NEW difficulty with bathing/dressing/toileting/self-feeding that is expected to last >3 days?: Yes (Initiates electronic notice to provider for possible OT consult) Does the patient have a NEW difficulty with getting in/out of bed, walking, or climbing stairs that is expected to last >3 days?: Yes (Initiates electronic notice to provider for possible PT consult) Does the patient have a NEW difficulty with communication that is expected to last >3 days?: No Is the patient deaf or have difficulty hearing?: No Does the patient have difficulty seeing, even when wearing glasses/contacts?: Yes Does the patient have difficulty concentrating, remembering, or making decisions?: Yes  Permission Sought/Granted Permission sought to share information with : Facility Medical sales representative, Family Supports Permission granted to share information with : Yes, Verbal Permission Granted  Share Information with NAME: Marykay Mccleod and Darnette Lampron     Permission granted to share info w Relationship: Son and daughter  Permission granted to share info w Contact Information: [phone] and [phone]  Emotional Assessment Appearance:: Appears stated age, Well-Groomed Attitude/Demeanor/Rapport: Engaged Affect (typically observed): Stable, Pleasant, Appropriate, Adaptable Orientation: : Oriented to Self, Oriented to Place, Oriented to  Time, Oriented to Situation Alcohol  / Substance Use: Not Applicable Psych Involvement: No (comment)  Admission diagnosis:  Dehydration [E86.0] AKI (acute kidney injury) (HCC) [N17.9] Pneumonia due to infectious organism, unspecified laterality, unspecified part of lung [J18.9] Protein-calorie  malnutrition, unspecified severity (HCC) [E46] Patient Active Problem List   Diagnosis Date Noted   AKI (acute kidney injury) (HCC) 01/01/2024    Dehydration 01/01/2024   Decreased urine output 01/01/2024   Ileostomy in place [facility]) 06/21/2021   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 Slater Falconer at [facility]    OA (osteoarthritis)    Anemia of chronic disease - Dr Sherrod 06/07/2012   Iron deficiency anemia 05/10/2011   PCP:  Geofm Glade PARAS, MD Pharmacy:   [facility] [place], [place] - [address] [place] [address] Phone: [phone] Fax: [phone]     Social Drivers of Health (SDOH) Social History: SDOH Screenings   Food Insecurity: No Food Insecurity (01/14/2024)  Housing: Low Risk  (01/14/2024)  Transportation Needs: No Transportation Needs (01/14/2024)  Utilities: Not At Risk (01/14/2024)  Alcohol  Screen: Low Risk  (01/17/2023)  Financial Resource Strain: Patient Unable To Answer (12/25/2023)   Received from [facility]  Physical Activity: Sufficiently Active (01/17/2023)  Social Connections: Moderately Isolated (01/14/2024)  Stress: Stress Concern Present (01/17/2023)  Tobacco Use: Medium Risk (01/14/2024)  Health Literacy: Adequate Health Literacy (01/17/2023)   SDOH Interventions: None indicated     Readmission Risk Interventions    01/15/2024    3:32 PM  Readmission Risk Prevention Plan  Transportation Screening Complete  PCP or Specialist Appt within 5-7 Days Complete  Home Care Screening Complete  Medication Review (RN CM) Complete     Signed: Heather Saltness, MSW, LCSW Clinical Social Worker Inpatient Care Management 01/15/2024 3:40 PM

## 2024-01-15 NOTE — Consult Note (Signed)
 Consultation Note Date: 01/15/2024   Patient Name: Kim [name]  DOB: 1940/08/06  MRN: [id number]  Age / Sex: 83 y.o., female  PCP: Geofm Glade PARAS, MD Referring Physician: Arlice Reichert, MD  Reason for Consultation: Establishing goals of care  HPI/Patient Profile: 83 y.o. female  admitted on 01/14/2024   Clinical Assessment and Goals of Care: 83 year old lady from functional medicine including, history of Crohn's disease with ileostomy, CKD osteoporosis osteoarthritis chronic anemia current smoker Patient admitted with dehydration, pneumonia, acute on chronic kidney disease to the hospital medicine service. Palliative service consult for palliative care discussions-requested. Discussed with son and daughter. Palliative medicine is specialized medical care for people living with serious illness. It focuses on providing relief from the symptoms and stress of a serious illness. The goal is to improve quality of life for both the patient and the family. Goals of care: Broad aims of medical therapy in relation to the patient's values and preferences. Our aim is to provide medical care aimed at enabling patients to achieve the goals that matter most to them, given the circumstances of their particular medical situation and their constraints.    NEXT OF KIN  Son and daughter, currently at bedside, they are both from out of town.   SUMMARY OF RECOMMENDATIONS   Goals of care discussions have held with the patient's son and daughter outside the room.  Also joined by palliative services liaison from [facility].  TOC consult for starting home with home health, home-based physical therapy at the patient's independent living.  Patient not interested in pursuing ALF or SNF rehab.  Continue current oral care.  Recommend continued outpatient palliative support Thank you for the consult.  Code Status/Advance Care  Planning: DNR   Symptom Management:     Palliative Prophylaxis:  Delirium Protocol  Psycho-social/Spiritual:  Desire for further Chaplaincy support:yes Additional Recommendations: Caregiving  Support/Resources  Prognosis:  Unable to determine  Discharge Planning: To Be Determined      Primary Diagnoses: Present on Admission:  AKI (acute kidney injury) (HCC)   I have reviewed the medical record, interviewed the patient and family, and examined the patient. The following aspects are pertinent.  Past Medical History:  Diagnosis Date   Anemia    related to malabsorption--gets B12 shots and iron infusions, managed by Dr. Sherrod   [facility] (basal cell carcinoma of skin)    Crohn disease (HCC)    Dr. Dudley at [facility]    Dr. Frutoso   Intestinal malabsorption    OA (osteoarthritis)    hands,back   Osteoporosis    managed by Dr. 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  Vaping Use   Vaping status: Never Used  Substance and Sexual Activity   Alcohol  use: No   Drug use: No   Sexual activity: Not Currently  Other Topics Concern   Not on file  Social History Narrative   Husband passed away end of 12/18/2023, no pets.  Children in [place] and [place].  Husband has Parkinsons Disease   Social Drivers of Health   Financial Resource Strain: Patient Unable To Answer (12/25/2023)   Received from [facility]   Overall Financial Resource Strain (CARDIA)    Difficulty of Paying  Living Expenses: Patient unable to answer  Food Insecurity: No Food Insecurity (01/14/2024)   Hunger Vital Sign    Worried About Running Out of Food in the Last Year: Never true    Ran Out of Food in the Last Year: Never true  Transportation Needs: No Transportation Needs (01/14/2024)   PRAPARE - Administrator, Civil Service (Medical): No    Lack of Transportation (Non-Medical): No  Physical Activity: Sufficiently Active (01/17/2023)   Exercise Vital Sign    Days of Exercise per Week: 6 days    Minutes of Exercise per Session: 40 min  Stress: Stress Concern Present (01/17/2023)   [employer] of Occupational Health - Occupational Stress Questionnaire    Feeling of Stress : Very much  Social Connections: Moderately Isolated (01/14/2024)   Social Connection and Isolation Panel    Frequency of Communication with Friends and Family: More than three times a week    Frequency of Social Gatherings with Friends and Family: Twice a week    Attends Religious Services: Never    Database administrator or Organizations: Yes    Attends Banker Meetings: 1 to 4 times per year    Marital Status: Widowed   Family History  Problem Relation Age of Onset   Diabetes Mother        borderline   Heart disease Mother        MI   Heart disease Father    Thyroid disease Sister    Scheduled Meds:  brimonidine   1 drop Both Eyes TID   dorzolamide -timolol   1 drop Both Eyes BID   feeding supplement  1 Container Oral TID BM   heparin   5,000 Units Subcutaneous Q8H   multivitamin with minerals  1 tablet Oral Daily   Netarsudil -Latanoprost   1 drop Ophthalmic QHS   Continuous Infusions:  azithromycin  500 mg (01/15/24 1001)   cefTRIAXone  (ROCEPHIN )  IV 2 g (01/15/24 1131)   magnesium  sulfate bolus IVPB     sodium bicarbonate  75 mEq in sodium chloride  0.45 % 1,075 mL infusion 75 mL/hr at 01/15/24 1127   PRN Meds:.acetaminophen , guaiFENesin , ipratropium-albuterol , loperamide ,  melatonin, polyethylene glycol, prochlorperazine  Medications Prior to Admission:  Prior to Admission medications   Medication Sig Start Date End Date Taking? Authorizing Provider  Abaloparatide  (TYMLOS ) 3120 MCG/1.56ML SOPN Inject 1.56 mLs into the skin at bedtime.   Yes [provider]  acetaminophen  (TYLENOL ) 500 MG tablet Take 500-1,000 mg by mouth in the morning, at noon, and at bedtime. Take 500mg  in the morning, 500mg  with lunch, and 1000mg  in the evening for a total daily dose of 2000mg    Yes [provider]  brimonidine  (ALPHAGAN ) 0.2 % ophthalmic solution Place 1 drop into both eyes 3 (three) times daily. 04/01/21  Yes [provider]  Calcium  Citrate-Vitamin D  200-125 MG-UNIT TABS Take 1-2 tablets by mouth See admin instructions. Take 2 tab in the morning and 1 tab at bedtime   Yes [provider]  Cholecalciferol  (VITAMIN D3) 50 MCG (2000 UT) TABS Take 2,000 Units by mouth daily.   Yes [provider]  cyanocobalamin  (VITAMIN B12) 1000 MCG/ML injection INJECT 1 ML IM ONCE A MONTH. 02/07/23  Yes Burns, Glade PARAS, MD  dorzolamide -timolol  (COSOPT ) 2-0.5 % ophthalmic solution Place 1 drop into both eyes 2 (two) times daily. 09/20/22  Yes [provider]  gabapentin  (NEURONTIN ) 300 MG capsule Take 600 mg by mouth at bedtime. 08/09/13  Yes [provider]  loperamide  (IMODIUM ) 2 MG capsule Take 2 mg by mouth as needed for diarrhea or loose stools. 01/12/24  Yes [provider]  loratadine (CLARITIN) 10 MG tablet Take 10 mg by mouth daily. [place]   Yes [provider]  Melatonin 10 MG TBCR Take 10 mg by mouth at bedtime.   Yes [provider]  Multiple Vitamins-Minerals (ONE-A-DAY WOMENS 50+ ADVANTAGE) TABS Take 1 tablet by mouth daily.   Yes [provider]  ROCKLATAN  0.02-0.005 % SOLN Place 1 drop into both eyes daily. 09/16/21  Yes [provider]  triamcinolone cream (KENALOG) 0.1 % Apply 1  application  topically every other day.   Yes [provider]  B-D 3CC LUER-LOK SYR 22GX1-1/2 22G X 1-1/2 3 ML MISC USE AS DIRECTED 12/31/21   Geofm Glade PARAS, MD  B-D 3CC LUER-LOK SYR 25GX5/8 25G X 5/8 3 ML MISC USE AS DIRECTED. 02/07/23   Geofm Glade PARAS, MD  Fluocinolone Acetonide Body 0.01 % OIL Apply topically. 01/19/23   [provider]   Allergies  Allergen Reactions   Antihistamines, Chlorpheniramine-Type Other (See Comments)    Severe dry mouth (speech-limiting)   Chlorpheniramine Other (See Comments)    Severe dry mouth (speech-limiting), dry eyes   Metronidazole Other (See Comments)    Peripheral neuropathy    Tape Other (See Comments)    Severe skin tearing (tolerates paper tape and coban only)   Wound Dressing Adhesive Other (See Comments)    Severe skin tearing (tolerates paper tape and coban only)   Other Diarrhea and Nausea And Vomiting    Patient has an Ileostomy Patient has an Energy manager, Antihistamine    Sulfa Antibiotics Other (See Comments)    Reaction type/severity unknown   Chlorhexidine  Itching and Rash   Hydrocodone -Acetaminophen  Nausea And Vomiting   Review of Systems + weakness  Physical Exam Awake alert Thinly built elderly appearing No distress  Vital Signs: BP (!) 109/43 (BP Location: Left Arm)   Pulse 80   Temp 99.3 F (37.4 C)   Resp 20   Ht 4' 8 (1.422 m)   Wt 34.6 kg Comment: Pt family stated that the patient weighed 68lbs last week standing on the scale at the drs office  SpO2 96%   BMI 17.08 kg/m  Pain Scale: 0-10   Pain Score: 0-No pain   SpO2: SpO2: 96 % O2 Device:SpO2: 96 % O2 Flow Rate: .O2 Flow Rate (L/min): 2 L/min  IO: Intake/output summary:  Intake/Output Summary (Last 24 hours) at 01/15/2024 1250 Last data filed at 01/15/2024 9347 Gross per 24 hour  Intake 825 ml  Output 350 ml  Net 475 ml    LBM:  Last BM Date : 01/14/24 Baseline Weight: Weight: 33.6 kg Most recent weight:  Weight: 34.6 kg (Pt family stated that the patient weighed 68lbs last week standing on the scale at the drs office)     Palliative Assessment/Data:   PPS 50%  Time In:  12 Time Out:  1300 Time Total:  60 Greater than 50%  of this time was spent counseling and coordinating care related to the above assessment and plan.  Signed by: Lonia Serve, MD   Please contact Palliative Medicine Team phone at [phone] for questions and concerns.  For individual provider: See Tracey

## 2024-01-15 NOTE — Progress Notes (Addendum)
 Initial Nutrition Assessment  DOCUMENTATION CODES:   Severe malnutrition in context of chronic illness, Underweight  INTERVENTION:   -Boost Breeze po TID, each supplement provides 250 kcal and 9 grams of protein   -Multivitamin with minerals daily  -Checking vitamin labs: Vitamin A , Copper  and Zinc  levels  Addendum 1530: Spoke with patient and pt's family on the phone. Pt with questions of how much fluid she should take in to help prevent dehydration. Provided Dehydration tips in AVS as well for family to reference  NUTRITION DIAGNOSIS:   Severe Malnutrition related to chronic illness as evidenced by severe fat depletion, severe muscle depletion  GOAL:   Patient will meet greater than or equal to 90% of their needs  MONITOR:   PO intake, Supplement acceptance, Labs  REASON FOR ASSESSMENT:   Consult Assessment of nutrition requirement/status  ASSESSMENT:   83 y.o. female with PMH significant for Crohn's disease with ileostomy, CKD, osteoporosis, osteoarthritis, chronic anemia, former smoker.  8/10, patient presented to the ED from [facility] with complaint of generalized weakness productive cough, fever for 2 days. Patient lost her husband 11/27/22 and since then has had a decrease in her appetite and at least 20 lbs unintentional weight loss.  Also reports intermittent trouble swallowing.  Patient in room, sitting in chair. Pt's daughter and son at bedside. Pt reports she ate some breakfast this morning (eggs, banana), as well as a [employer] which she likes. Family reports she was started on them a few days before admission, recommended by pt's GI.  Pt's ileostomy output has been ~1050 ml since yesterday. At assisted living, pt was consuming a banana for breakfast, a sandwich for lunch and whatever was served for dinner minus the meat entree as pt does not consume meats.  Pt reports some difficulty swallowing, was assessed by SLP this morning and approved for regular  diet. Per review of chart, pt with history of crohn's disease s/p colectomy. Suspected malabsorption. Family reports husband passed last year so depression is a factor as well.  Pt reports taking a daily MVI. Gets B-12 shots as well a iron infusions. Pt at risk of vitamin deficiencies given poor PO over extended period of time as well as malabsorption. Will check vitamin labs.  Pt reports her weight has decreased to 67 lbs at home. Per weight records, pt's weight has been between 78 and 76 lbs since October 2024. UBW ~80 lbs per patient.  Medications: IV Mg sulfate  Labs reviewed: Low Mg   NUTRITION - FOCUSED PHYSICAL EXAM:  Flowsheet Row Most Recent Value  Orbital Region Severe depletion  Upper Arm Region Severe depletion  Thoracic and Lumbar Region Unable to assess  Buccal Region Severe depletion  Temple Region Severe depletion  Clavicle Bone Region Severe depletion  Clavicle and Acromion Bone Region Severe depletion  Scapular Bone Region Severe depletion  Dorsal Hand Severe depletion  Patellar Region Unable to assess  Anterior Thigh Region Unable to assess  Posterior Calf Region Unable to assess  Edema (RD Assessment) None  Hair Reviewed  [thin]  Eyes Reviewed  Mouth Reviewed  [missing a few teeth]  Skin Reviewed  Nails Reviewed    Diet Order:   Diet Order             Diet regular Room service appropriate? Yes; Fluid consistency: Thin  Diet effective now                   EDUCATION NEEDS:   Education needs  have been addressed  Skin:  Skin Assessment: Reviewed RN Assessment  Last BM:  8/11 -ileostomy  Height:   Ht Readings from Last 1 Encounters:  01/14/24 4' 8 (1.422 m)    Weight:   Wt Readings from Last 1 Encounters:  01/14/24 34.6 kg   BMI:  Body mass index is 17.08 kg/m.  Estimated Nutritional Needs:   Kcal:  1300-1500  Protein:  65-85g  Fluid:  1.5L/day  Morna Lee, MS, RD, LDN Inpatient Clinical Dietitian Contact via Secure  chat

## 2024-01-15 NOTE — Evaluation (Signed)
 Clinical/Bedside Swallow Evaluation Patient Details  Name: Kim [name] MRN: [id number] Date of Birth: [birth year]  Today's Date: 01/15/2024 Time: SLP Start Time (ACUTE ONLY): 0956 SLP Stop Time (ACUTE ONLY): 1026 SLP Time Calculation (min) (ACUTE ONLY): 30 min  Past Medical History:  Past Medical History:  Diagnosis Date   Anemia    related to malabsorption--gets B12 shots and iron infusions, managed by Dr. Sherrod   [facility] (basal cell carcinoma of skin)    Crohn disease (HCC)    Dr. Dudley at [facility]   Glaucoma    Dr. Frutoso   Intestinal malabsorption    OA (osteoarthritis)    hands,back   Osteoporosis    managed by Dr. Shona at [place], [place] q2 yrs   Past Surgical History:  Past Surgical History:  Procedure Laterality Date   BREAST EXCISIONAL BIOPSY Right    BRONCHIAL BIOPSY  12/26/2022   Procedure: BRONCHIAL BIOPSIES;  Surgeon: Shelah Lamar RAMAN, MD;  Location: [facility] ENDOSCOPY;  Service: Pulmonary;;   BRONCHIAL BRUSHINGS  12/26/2022   Procedure: BRONCHIAL BRUSHINGS;  Surgeon: Shelah Lamar RAMAN, MD;  Location: [facility] ENDOSCOPY;  Service: Pulmonary;;   BRONCHIAL NEEDLE ASPIRATION BIOPSY  12/26/2022   Procedure: BRONCHIAL NEEDLE ASPIRATION BIOPSIES;  Surgeon: Shelah Lamar RAMAN, MD;  Location: [facility] ENDOSCOPY;  Service: Pulmonary;;   BRONCHIAL WASHINGS  12/26/2022   Procedure: BRONCHIAL WASHINGS;  Surgeon: Shelah Lamar RAMAN, MD;  Location: [facility] ENDOSCOPY;  Service: Pulmonary;;   Crohn  2009   internal ostomy pouch  1994   INTRAMEDULLARY (IM) NAIL INTERTROCHANTERIC Left 03/05/2016   Procedure: INTRAMEDULLARY (IM) NAIL INTERTROCHANTRIC;  Surgeon: Selinda Belvie Gosling, MD;  Location: [facility] OR;  Service: Orthopedics;  Laterality: Left;   REDUCTION MAMMAPLASTY Bilateral 01/2017   TOTAL COLECTOMY  1984   due to Crohn's   HPI:  Patient is an 83 y.o. female with PMH: GERD, Crohn's disease with ileostomy, osteoporosis, CKD stage 3B, former smoker (quit more than 50 years ago), glaucoma, basal cell carcinoma of  skin, anemia. Since her husband's death Dec 27, 2022, she has had decreased appetite and at least 20lbs of unintentional weight loss. She has been hospitalized at [facility] twice this year for dehydration. She presented to [facility] on 01/14/24 after being found generally weak at home for past two days. She was noted to have a fever and was brought to ER for evaluation. She reports trouble swallowing at times. In ED, Tmax was 100.6 degrees F, O2 saturations 95% on RA.    Assessment / Plan / Recommendation  Clinical Impression  Patient is not currently presenting with clinical s/s of oropharyngeal dysphagia as per this bedside swallow evaluation. As per her c/o of globus sensation and pain with solid foods when they seem to get stuck. She pointed to sternum and told SLP I can't get it past here. SLP assessed her swallowing via thin liquids, regular solids and with her vitamin (taken with thin liquids). No overt s/s aspiration during or after PO intake. She did exhibit an occasional throat clear with audible secretions which did not appear to correlate with PO intake. She denied any globus sensation or pain with solid foods (toast) or with vitamin. Her son arrived at end of evaluation and he did not express any concerns of dysphagia. He stated that he and siblings have been trying to fatten her up on foods like pastas, etc in hopes of her regaining some of her lost weight. SLP not recommending further skilled intervention but if she continues with c/o globus sensation and  associated pain, would recommend f/u from GI versus esophageal assessment. SLP Visit Diagnosis: Dysphagia, unspecified (R13.10)    Aspiration Risk  Mild aspiration risk    Diet Recommendation Regular;Thin liquid    Liquid Administration via: Cup;Straw Medication Administration: Whole meds with liquid Compensations: Slow rate;Small sips/bites Postural Changes: Remain upright for at least 30 minutes after po intake;Seated upright at 90 degrees     Other  Recommendations Oral Care Recommendations: Oral care BID     Assistance Recommended at Discharge    Functional Status Assessment Patient has not had a recent decline in their functional status  Frequency and Duration     N/A       Prognosis   N/A     Swallow Study   General Date of Onset: 01/14/24 HPI: Patient is an 83 y.o. female with PMH: GERD, Crohn's disease with ileostomy, osteoporosis, CKD stage 3B, former smoker (quit more than 50 years ago), glaucoma, basal cell carcinoma of skin, anemia. Since her husband's death 2022-12-13, she has had decreased appetite and at least 20lbs of unintentional weight loss. She has been hospitalized at [facility] twice this year for dehydration. She presented to [facility] on 01/14/24 after being found generally weak at home for past two days. She was noted to have a fever and was brought to ER for evaluation. She reports trouble swallowing at times. In ED, Tmax was 100.6 degrees F, O2 saturations 95% on RA. Type of Study: Bedside Swallow Evaluation Previous Swallow Assessment: remote, BSE in 2017 Diet Prior to this Study: Regular;Thin liquids (Level 0) Temperature Spikes Noted: No Respiratory Status: Nasal cannula Behavior/Cognition: Alert;Cooperative;Pleasant mood Oral Cavity Assessment: Within Functional Limits Oral Care Completed by SLP: No Oral Cavity - Dentition: Adequate natural dentition Vision: Functional for self-feeding Self-Feeding Abilities: Able to feed self Patient Positioning: Upright in chair Baseline Vocal Quality: Hoarse Volitional Cough: Weak Volitional Swallow: Able to elicit    Oral/Motor/Sensory Function Overall Oral Motor/Sensory Function: Within functional limits   Ice Chips     Thin Liquid Thin Liquid: Within functional limits Presentation: Cup;Self Fed    Nectar Thick     Honey Thick     Puree Puree: Not tested   Solid     Solid: Within functional limits Presentation: Self Fed     Norleen IVAR Blase, MA,  CCC-SLP Speech Therapy

## 2024-01-15 NOTE — Progress Notes (Signed)
 [id number] liaison note:   This is a current patient with [facility], followed for outpatient palliative care.   [facility] will continue to follow for discharge disposition.   Please call with any hospice or OPP related questions or concerns.   Thank you, Eleanor Nail, LPN [phone]

## 2024-01-15 NOTE — Progress Notes (Signed)
 Home meds sent to pharmacy. Family and patient aware. Sheet is in the chart

## 2024-01-15 NOTE — Plan of Care (Signed)

## 2024-01-15 NOTE — Telephone Encounter (Signed)
 Beverley notified me that Kim [name] is admitted to hospital for pneumonia. Please cancel appt today . Done.

## 2024-01-16 DIAGNOSIS — E43 Unspecified severe protein-calorie malnutrition: Secondary | ICD-10-CM | POA: Insufficient documentation

## 2024-01-16 DIAGNOSIS — N179 Acute kidney failure, unspecified: Secondary | ICD-10-CM | POA: Diagnosis not present

## 2024-01-16 LAB — CBC WITH DIFFERENTIAL/PLATELET
Abs Immature Granulocytes: 0.09 K/uL — ABNORMAL HIGH (ref 0.00–0.07)
Basophils Absolute: 0 K/uL (ref 0.0–0.1)
Basophils Relative: 0 %
Eosinophils Absolute: 0.2 K/uL (ref 0.0–0.5)
Eosinophils Relative: 2 %
HCT: 25.3 % — ABNORMAL LOW (ref 36.0–46.0)
Hemoglobin: 7.8 g/dL — ABNORMAL LOW (ref 12.0–15.0)
Immature Granulocytes: 1 %
Lymphocytes Relative: 7 %
Lymphs Abs: 0.7 K/uL (ref 0.7–4.0)
MCH: 29.9 pg (ref 26.0–34.0)
MCHC: 30.8 g/dL (ref 30.0–36.0)
MCV: 96.9 fL (ref 80.0–100.0)
Monocytes Absolute: 0.5 K/uL (ref 0.1–1.0)
Monocytes Relative: 5 %
Neutro Abs: 8.4 K/uL — ABNORMAL HIGH (ref 1.7–7.7)
Neutrophils Relative %: 85 %
Platelets: 193 K/uL (ref 150–400)
RBC: 2.61 MIL/uL — ABNORMAL LOW (ref 3.87–5.11)
RDW: 14.3 % (ref 11.5–15.5)
WBC: 9.9 K/uL (ref 4.0–10.5)
nRBC: 0 % (ref 0.0–0.2)

## 2024-01-16 LAB — BASIC METABOLIC PANEL WITH GFR
Anion gap: 11 (ref 5–15)
BUN: 45 mg/dL — ABNORMAL HIGH (ref 8–23)
CO2: 18 mmol/L — ABNORMAL LOW (ref 22–32)
Calcium: 9.3 mg/dL (ref 8.9–10.3)
Chloride: 110 mmol/L (ref 98–111)
Creatinine, Ser: 2.3 mg/dL — ABNORMAL HIGH (ref 0.44–1.00)
GFR, Estimated: 21 mL/min — ABNORMAL LOW (ref >60)
Glucose, Bld: 104 mg/dL — ABNORMAL HIGH (ref 70–99)
Potassium: 2.6 mmol/L — CL (ref 3.5–5.1)
Sodium: 139 mmol/L (ref 135–145)

## 2024-01-16 LAB — PHOSPHORUS: Phosphorus: 2.8 mg/dL (ref 2.5–4.6)

## 2024-01-16 LAB — MAGNESIUM: Magnesium: 2 mg/dL (ref 1.7–2.4)

## 2024-01-16 MED ORDER — MIRTAZAPINE 15 MG PO TABS
7.5000 mg | ORAL_TABLET | Freq: Every day | ORAL | Status: DC
  Administered 2024-01-17 (×2): 7.5 mg via ORAL
  Filled 2024-01-16 (×3): qty 1

## 2024-01-16 MED ORDER — GABAPENTIN 300 MG PO CAPS
600.0000 mg | ORAL_CAPSULE | Freq: Every day | ORAL | Status: DC
  Administered 2024-01-16 – 2024-01-18 (×5): 600 mg via ORAL
  Filled 2024-01-16 (×3): qty 2

## 2024-01-16 MED ORDER — AZITHROMYCIN 250 MG PO TABS
500.0000 mg | ORAL_TABLET | Freq: Every day | ORAL | Status: AC
  Administered 2024-01-16 – 2024-01-17 (×4): 500 mg via ORAL
  Filled 2024-01-16 (×3): qty 2

## 2024-01-16 MED ORDER — POTASSIUM CHLORIDE CRYS ER 20 MEQ PO TBCR
40.0000 meq | EXTENDED_RELEASE_TABLET | ORAL | Status: AC
Start: 2024-01-16 — End: 2024-01-16
  Administered 2024-01-16 (×4): 40 meq via ORAL
  Filled 2024-01-16 (×2): qty 2

## 2024-01-16 NOTE — Progress Notes (Signed)
 Pt has been very confused this shift. Also has been insulting to staff. For example she asked for the light to be turned out then ask why are you turning the light out. Can you put my bed on the wall.Calling the lab staff sadistic then saying do you know the meaning of it? Attempted to reorient but was unsuccessful.

## 2024-01-16 NOTE — Plan of Care (Signed)
  Problem: Education: Goal: Knowledge of General Education information will improve Description: Including pain rating scale, medication(s)/side effects and non-pharmacologic comfort measures Outcome: Progressing   Problem: Clinical Measurements: Goal: Ability to maintain clinical measurements within normal limits will improve Outcome: Progressing   Problem: Clinical Measurements: Goal: Will remain free from infection Outcome: Progressing   Problem: Activity: Goal: Risk for activity intolerance will decrease Outcome: Progressing   Problem: Nutrition: Goal: Adequate nutrition will be maintained Outcome: Progressing

## 2024-01-16 NOTE — Progress Notes (Signed)
 PROGRESS NOTE  Kim [name]  DOB: [birth year]  PCP: Geofm Glade PARAS, MD FMW:[id number]  DOA: 01/14/2024  LOS: 2 days  Hospital Day: 3  Brief narrative: Kim [name] is a 83 y.o. female with PMH significant for Crohn's disease with ileostomy, CKD, osteoporosis, osteoarthritis, chronic anemia, former smoker. 8/10, patient presented to the ED from [facility] with complaint of generalized weakness productive cough, fever for 2 days. Patient lost her husband 11/27/22 and since then has had a decrease in her appetite and at least 20 lbs unintentional weight loss.  Also reports intermittent trouble swallowing.   She had had 2 hospitalizations at [facility] for dehydration this year.    In the ED, patient had a temperature of 100.6, hemodynamically stable Labs showed WC count 9.4, hemoglobin 10.8, lactic acid normal, procalcitonin elevated to 4.28, BUN/creatinine elevated to 67/3.22 Urinalysis with hazy yellow color urine, large leukocytes Chest x-ray showed increased interstitial opacities throughout the right lung with new blunting of the right costophrenic angle. Findings are concerning for infection. Urine culture and blood culture sent Started on empiric antibiotic Admitted to [facility]  Subjective: Patient was seen and examined this morning. Sitting up in recliner.  Not in distress.  She had concerns about nursing care last night. Patient's daughter was at bedside. Seen by speech therapist yesterday.  Seen by PT today.  SNF recommended  Assessment and plan: Right-sided community-acquired pneumonia, POA Presented with shortness of breath, cough, fever  Had 1 episode of fever in the ED Chest x-ray with right sided infiltrates.  WBC count normal but procalcitonin level was elevated Currently on IV Rocephin  and azithromycin .   Continue aspiration precautions.  Speech eval obtained.  Mild aspiration risk noted. Clinically feels better.  Still with some crackles in the right lung base but  improving. Continue PRN antitussives and bronchodilators Recent Labs  Lab 01/14/24 1540 01/14/24 1711 01/15/24 0510 01/15/24 0706 01/16/24 0507  WBC 9.4  --   --  8.9 9.9  LATICACIDVEN  --  1.8  --   --   --   PROCALCITON  --   --  4.28  --   --    AKI on CKD 4 Severe metabolic acidosis Baseline creatinine 2.41 from January 2025 Presented with creatinine elevated at 3.22 and serum bicarb of 14 secondary to dehydration  Currently on bicarb drip.  Noted gradual improvement in creatinine and bicarb in a.m. labs Continue to monitor  Recent Labs    01/23/23 1547 06/21/23 1114 01/14/24 1540 01/15/24 0510 01/16/24 0507  BUN 37* 92* 67* 53* 45*  CREATININE 1.64* 2.41* 3.22* 2.52* 2.30*  CO2 26 22 14* 12* 18*   Mild hypercalcemia Presented with calcium  level elevated to 10.6 in the setting of teriparatide use No reported symptoms of hypercalcemia Improving with IV fluid   Crohn's disease with ileostomy Follows with GI at [facility], Dr. Dudley Bradley [phone]   Anemia of chronic disease in the setting of CKD Follows with Dr. Sherrod, hematology Has had iron infusions in the past Noted drop in hemoglobin today likely dilutional.  No active bleeding.  Continue to monitor. Recent Labs    01/23/23 1547 06/21/23 1113 06/21/23 1114 01/14/24 1540 01/15/24 0706 01/16/24 0507  HGB 10.3*  --  10.8* 10.8* 8.0* 7.8*  MCV 97.1  --  96.4 97.2 91.1 96.9  VITAMINB12  --   --  1,780*  --   --   --   FOLATE  --  >40.0  --   --   --   --  FERRITIN 892.5*  --  961*  --   --   --   TIBC 245.0*  --  241*  --   --   --   IRON 55  --  67  --   --   --    Severe osteoporosis Continue home teriparatide   Severe protein calorie malnutrition Severe muscle mass loss BMI 16 and serum albumin 3.0 Encourage increase oral protein calorie intake Liberalize diet Dietitian consulted   Cluster of nodules in the right lower lung Former smoker, quit more than 50 years ago Follows with  [facility] [facility] [facility], Dr. Neysa. Has an upcoming appointment with Dr. Neysa on Thursday, 01/18/2024.   Glaucoma Continue home regimen   Grief Husband deceased 2022/12/17 Not suicidal.  Has declined antidepressants.   Generalized weakness, multifactorial in the setting of all of the above PT OT evaluation Fall precautions     Mobility:  PT Orders: Active   PT Follow up Rec: Skilled Nursing-Short Term Rehab (<3 Hours/Day)01/16/2024 1312   Goals of care   Code Status: Limited: Do not attempt resuscitation (DNR) -DNR-LIMITED -Do Not Intubate/DNI      DVT prophylaxis:  heparin  injection 5,000 Units Start: 01/14/24 2200   Antimicrobials: IV Rocephin , azithromycin  Fluid: Bicarb drip at 75 mL/h Consultants: None Family Communication: Daughter at bedside  Status: Inpatient Level of care:  Telemetry   Patient is from: [facility] ILF Needs to continue in-hospital care: Needs IV antibiotics, therapy, IV hydration.  PT recommended SNF Anticipated d/c to: Pending clinical course      Diet:  Diet Order             Diet regular Room service appropriate? Yes; Fluid consistency: Thin  Diet effective now                   Scheduled Meds:  Abaloparatide   80 mcg Subcutaneous Q2000   azithromycin   500 mg Oral Daily   brimonidine   1 drop Both Eyes TID   dorzolamide -timolol   1 drop Both Eyes BID   feeding supplement  1 Container Oral TID BM   gabapentin   600 mg Oral QHS   heparin   5,000 Units Subcutaneous Q8H   mirtazapine   7.5 mg Oral QHS   multivitamin with minerals  1 tablet Oral Daily   Netarsudil -Latanoprost   1 drop Ophthalmic QHS    PRN meds: acetaminophen , guaiFENesin , ipratropium-albuterol , loperamide , melatonin, polyethylene glycol, prochlorperazine    Infusions:   cefTRIAXone  (ROCEPHIN )  IV 2 g (01/16/24 1045)   sodium bicarbonate  75 mEq in sodium chloride  0.45 % 1,075 mL infusion 75 mL/hr at 01/15/24 1127    Antimicrobials: Anti-infectives (From admission,  onward)    Start     Dose/Rate Route Frequency Ordered Stop   01/16/24 1000  azithromycin  (ZITHROMAX ) tablet 500 mg        500 mg Oral Daily 01/16/24 0849 01/19/24 0959   01/15/24 1000  cefTRIAXone  (ROCEPHIN ) 2 g in sodium chloride  0.9 % 100 mL IVPB        2 g 200 mL/hr over 30 Minutes Intravenous Every 24 hours 01/14/24 1948     01/15/24 1000  azithromycin  (ZITHROMAX ) 500 mg in sodium chloride  0.9 % 250 mL IVPB  Status:  Discontinued        500 mg 250 mL/hr over 60 Minutes Intravenous Every 24 hours 01/14/24 1948 01/16/24 0849   01/14/24 1900  cefTRIAXone  (ROCEPHIN ) 1 g in sodium chloride  0.9 % 100 mL IVPB        1  g 200 mL/hr over 30 Minutes Intravenous  Once 01/14/24 1858 01/14/24 2005   01/14/24 1900  azithromycin  (ZITHROMAX ) 500 mg in sodium chloride  0.9 % 250 mL IVPB        500 mg 250 mL/hr over 60 Minutes Intravenous  Once 01/14/24 1858 01/14/24 2110       Objective: Vitals:   01/16/24 0538 01/16/24 1133  BP: 115/70 (!) 122/38  Pulse: 80 83  Resp:  15  Temp: 98 F (36.7 C) 99.7 F (37.6 C)  SpO2: 98% 96%    Intake/Output Summary (Last 24 hours) at 01/16/2024 1558 Last data filed at 01/16/2024 1202 Gross per 24 hour  Intake 240 ml  Output 450 ml  Net -210 ml   Filed Weights   01/14/24 1448 01/14/24 2229  Weight: 33.6 kg 34.6 kg   Weight change:  Body mass index is 17.08 kg/m.   Physical Exam: General exam: Pleasant, thin built elderly Caucasian female.  Not in distress Skin: No rashes, lesions or ulcers.  Hydration status seems better today HEENT: Atraumatic, normocephalic, no obvious bleeding Lungs: Mild diminished air entry in both bases.  Mild crackles on the right lung base persists CVS: S1, S2, no murmur,   GI/Abd: Soft, nontender, nondistended, bowel sound present,   CNS: Alert, awake, oriented x 3 Psychiatry: Sad affect Extremities: No pedal edema, no calf tenderness,   Data Review: I have personally reviewed the laboratory data and studies  available.  F/u labs ordered Unresulted Labs (From admission, onward)     Start     Ordered   01/17/24 0500  Type and screen  Once,   R        01/16/24 0832   01/17/24 0500  Procalcitonin  Tomorrow morning,   R       References:    Procalcitonin Lower Respiratory Tract Infection AND Sepsis Procalcitonin Algorithm   01/16/24 0834   01/16/24 0500  CBC with Differential/Platelet  Daily,   R      01/15/24 1101   01/16/24 0500  Basic metabolic panel with GFR  Daily,   R      01/15/24 1101   01/16/24 0500  Phosphorus  Daily,   R      01/15/24 1456   01/16/24 0500  Vitamin A   Tomorrow morning,   R        01/15/24 1456   01/16/24 0500  Copper , serum  Tomorrow morning,   R        01/15/24 1456   01/16/24 0500  Zinc   Tomorrow morning,   R        01/15/24 1456   01/14/24 1523  Blood culture (routine x 2)  BLOOD CULTURE X 2,   R      01/14/24 1522            Signed, Chapman Rota, MD [facility] 01/16/2024

## 2024-01-16 NOTE — TOC Progression Note (Signed)
 Transition of Care [facility]) - Progression Note    Patient Details  Name: Kim [name] MRN: [id number] Date of Birth: February 06, 1941  Transition of [facility]) CM/SW Contact  Heather DELENA Saltness, LCSW Phone Number: 01/16/2024, 12:39 PM  Clinical Narrative:    Pt and family report still wanting to seek [facility] PT/OT services at her ILF upon discharge. CSW gave pt and family a list of HH agencies that serve [place] area. Pt and family's preference is [facility] for services. Referral sent to Cindie at [facility], who confirmed ability to accept pt. Pt's family also requested to speak with [facility] representative prior to discharge to discuss services offered. CSW provided Cindie with pt's son, Aury Scollard [phone], contact information. Cindie reports she will reach out to pt's son this afternoon. TOC will continue to follow.   Expected Discharge Plan: Home w Home Health Services Barriers to Discharge: Continued Medical Work up   Expected Discharge Plan and Services In-house Referral: Clinical Social Work Discharge Planning Services: NA Post Acute Care Choice: Home Health Living arrangements for the past 2 months: Independent Living Facility                 DME Arranged: N/A DME Agency: NA       HH Arranged: PT, OT           Social Drivers of Health (SDOH) Interventions SDOH Screenings   Food Insecurity: No Food Insecurity (01/14/2024)  Housing: Low Risk  (01/14/2024)  Transportation Needs: No Transportation Needs (01/14/2024)  Utilities: Not At Risk (01/14/2024)  Alcohol Screen: Low Risk  (01/17/2023)  Financial Resource Strain: Patient Unable To Answer (12/25/2023)   Received from [facility]  Physical Activity: Sufficiently Active (01/17/2023)  Social Connections: Moderately Isolated (01/14/2024)  Stress: Stress Concern Present (01/17/2023)  Tobacco Use: Medium Risk (01/14/2024)  Health Literacy: Adequate Health Literacy (01/17/2023)    Readmission Risk  Interventions    01/15/2024    3:32 PM  Readmission Risk Prevention Plan  Transportation Screening Complete  PCP or Specialist Appt within 5-7 Days Complete  Home Care Screening Complete  Medication Review (RN CM) Complete    Signed: Heather Saltness, MSW, LCSW Clinical Social Worker Inpatient Care Management 01/16/2024 12:59 PM

## 2024-01-16 NOTE — Plan of Care (Signed)

## 2024-01-16 NOTE — Evaluation (Signed)
 Physical Therapy Evaluation Patient Details Name: Kim [name] MRN: [id number] DOB: 1940-12-23 Today's Date: 01/16/2024  History of Present Illness  83 yo  comes to ED with weakness, productive cough, subjective fever. Chest x-ray revealed right-sided infiltrates suggestive of pneumonia PMH:s/pL L IM nail , osteoporosis, anemia, colostomy, Crohn disease.  Clinical Impression  Pt admitted with above diagnosis.  Pt currently with functional limitations due to the deficits listed below (see PT Problem List). Pt will benefit from acute skilled PT to increase their independence and safety with mobility to allow discharge.     The patient became somewhat anxious with therapist about reason she is in hospital. Patient gradually became less anxious, labile frequently. Daughter did come in and patient was calmer. Assisted patient to recliner, assisted emtying colostomy( pt. Performed 95%). Patient will benefit from continued inpatient follow up therapy, <3 hours/day.        If plan is discharge home, recommend the following: A little help with walking and/or transfers;A little help with bathing/dressing/bathroom;Assistance with cooking/housework;Help with stairs or ramp for entrance   Can travel by private vehicle   No    Equipment Recommendations None recommended by PT  Recommendations for Other Services       Functional Status Assessment Patient has had a recent decline in their functional status and demonstrates the ability to make significant improvements in function in a reasonable and predictable amount of time.     Precautions / Restrictions Precautions Precautions: Fall Recall of Precautions/Restrictions: Impaired Precaution/Restrictions Comments: Fragile skin. Aspiration, colostomy Restrictions Weight Bearing Restrictions Per Provider Order: No      Mobility  Bed Mobility   Bed Mobility: Supine to Sit     Supine to sit: Supervision, Used rails, HOB elevated      General bed mobility comments: Significant increased time and pt refusing assistance.    Transfers Overall transfer level: Needs assistance Equipment used: Rolling walker (2 wheels) Transfers: Sit to/from Stand, Bed to chair/wheelchair/BSC Sit to Stand: Min assist           General transfer comment: multimodal cues   and step by step instruction to stand step to recliner, Assisted to stand to change briefs.    Ambulation/Gait                  Stairs            Wheelchair Mobility     Tilt Bed    Modified Rankin (Stroke Patients Only)       Balance Overall balance assessment: Needs assistance Sitting-balance support: Feet supported, No upper extremity supported Sitting balance-Leahy Scale: Fair     Standing balance support: During functional activity Standing balance-Leahy Scale: Fair                               Pertinent Vitals/Pain Pain Assessment Faces Pain Scale: No hurt    Home Living Family/patient expects to be discharged to:: Private residence Living Arrangements: Alone Available Help at Discharge: Available PRN/intermittently Type of Home: Independent living facility         Home Layout: One level Home Equipment: [place] - single point (hurricane)      Prior Function Prior Level of Function : Needs assist       Physical Assist : Mobility (physical)   ADLs (physical): IADLs Mobility Comments: Pt reports ambulating with her cane PRN ADLs Comments: Pt has a housekeeping service ~2x/month but makes her own meals.  Extremity/Trunk Assessment   Upper Extremity Assessment Upper Extremity Assessment: Generalized weakness    Lower Extremity Assessment Lower Extremity Assessment: Generalized weakness    Cervical / Trunk Assessment Cervical / Trunk Assessment: Kyphotic;Other exceptions  Communication        Cognition Arousal: Alert Behavior During Therapy: Anxious, Agitated, Lability   PT - Cognitive  impairments: Difficult to assess, Orientation, Awareness                       PT - Cognition Comments: patient agitated stating the staff over night need to be reported, asking PT if knows her dx, became more agitated when PT did not answer to her satisfation. patient labile frequently. daughter came in and patient became calmer. patient stating that she must empty colostomy, even with PT suggesting that nursing can perform.         Cueing       General Comments      Exercises     Assessment/Plan    PT Assessment Patient needs continued PT services  PT Problem List Decreased strength;Decreased mobility;Decreased safety awareness;Decreased activity tolerance;Decreased balance;Decreased knowledge of use of DME;Decreased cognition       PT Treatment Interventions DME instruction;Therapeutic activities;Gait training;Therapeutic exercise;Patient/family education;Functional mobility training    PT Goals (Current goals can be found in the Care Plan section)  Acute Rehab PT Goals Patient Stated Goal: difficult to sort it out/labile PT Goal Formulation: With patient/family Time For Goal Achievement: 01/30/24 Potential to Achieve Goals: Good    Frequency Min 2X/week     Co-evaluation               AM-PAC PT 6 Clicks Mobility  Outcome Measure Help needed turning from your back to your side while in a flat bed without using bedrails?: A Little Help needed moving from lying on your back to sitting on the side of a flat bed without using bedrails?: A Little Help needed moving to and from a bed to a chair (including a wheelchair)?: A Little Help needed standing up from a chair using your arms (e.g., wheelchair or bedside chair)?: A Little Help needed to walk in hospital room?: Total Help needed climbing 3-5 steps with a railing? : Total 6 Click Score: 14    End of Session Equipment Utilized During Treatment: Gait belt Activity Tolerance: Patient tolerated  treatment well Patient left: with chair alarm set;with call bell/phone within reach;with family/visitor present;in chair Nurse Communication: Mobility status PT Visit Diagnosis: Unsteadiness on feet (R26.81);History of falling (Z91.81)    Time: 9056-8976 PT Time Calculation (min) (ACUTE ONLY): 40 min   Charges:   PT Evaluation $PT Eval Low Complexity: 1 Low PT Treatments $Therapeutic Activity: 23-37 mins PT General Charges $$ ACUTE PT VISIT: 1 Visit         [name] PT Acute Rehabilitation Services Office [phone]   [name] Norris 01/16/2024, 1:16 PM

## 2024-01-17 DIAGNOSIS — N179 Acute kidney failure, unspecified: Secondary | ICD-10-CM | POA: Diagnosis not present

## 2024-01-17 LAB — PROCALCITONIN: Procalcitonin: 4.48 ng/mL

## 2024-01-17 LAB — CBC WITH DIFFERENTIAL/PLATELET
Abs Immature Granulocytes: 0.11 K/uL — ABNORMAL HIGH (ref 0.00–0.07)
Basophils Absolute: 0 K/uL (ref 0.0–0.1)
Basophils Relative: 0 %
Eosinophils Absolute: 0.1 K/uL (ref 0.0–0.5)
Eosinophils Relative: 1 %
HCT: 26.2 % — ABNORMAL LOW (ref 36.0–46.0)
Hemoglobin: 8.3 g/dL — ABNORMAL LOW (ref 12.0–15.0)
Immature Granulocytes: 1 %
Lymphocytes Relative: 8 %
Lymphs Abs: 0.8 K/uL (ref 0.7–4.0)
MCH: 30.2 pg (ref 26.0–34.0)
MCHC: 31.7 g/dL (ref 30.0–36.0)
MCV: 95.3 fL (ref 80.0–100.0)
Monocytes Absolute: 0.6 K/uL (ref 0.1–1.0)
Monocytes Relative: 6 %
Neutro Abs: 9.2 K/uL — ABNORMAL HIGH (ref 1.7–7.7)
Neutrophils Relative %: 84 %
Platelets: 229 K/uL (ref 150–400)
RBC: 2.75 MIL/uL — ABNORMAL LOW (ref 3.87–5.11)
RDW: 14.3 % (ref 11.5–15.5)
WBC: 10.9 K/uL — ABNORMAL HIGH (ref 4.0–10.5)
nRBC: 0 % (ref 0.0–0.2)

## 2024-01-17 LAB — URINE CULTURE

## 2024-01-17 LAB — BASIC METABOLIC PANEL WITH GFR
Anion gap: 11 (ref 5–15)
BUN: 38 mg/dL — ABNORMAL HIGH (ref 8–23)
CO2: 19 mmol/L — ABNORMAL LOW (ref 22–32)
Calcium: 9.4 mg/dL (ref 8.9–10.3)
Chloride: 108 mmol/L (ref 98–111)
Creatinine, Ser: 2.27 mg/dL — ABNORMAL HIGH (ref 0.44–1.00)
GFR, Estimated: 21 mL/min — ABNORMAL LOW (ref >60)
Glucose, Bld: 107 mg/dL — ABNORMAL HIGH (ref 70–99)
Potassium: 2.9 mmol/L — ABNORMAL LOW (ref 3.5–5.1)
Sodium: 138 mmol/L (ref 135–145)

## 2024-01-17 LAB — POTASSIUM: Potassium: 4.7 mmol/L (ref 3.5–5.1)

## 2024-01-17 LAB — PHOSPHORUS: Phosphorus: 2.6 mg/dL (ref 2.5–4.6)

## 2024-01-17 LAB — TYPE AND SCREEN
ABO/RH(D): AB POS
Antibody Screen: NEGATIVE

## 2024-01-17 LAB — MAGNESIUM: Magnesium: 1.9 mg/dL (ref 1.7–2.4)

## 2024-01-17 MED ORDER — POTASSIUM CHLORIDE CRYS ER 20 MEQ PO TBCR
40.0000 meq | EXTENDED_RELEASE_TABLET | ORAL | Status: AC
  Administered 2024-01-17 (×4): 40 meq via ORAL
  Filled 2024-01-17 (×2): qty 2

## 2024-01-17 NOTE — Plan of Care (Signed)
  Problem: Elimination: Goal: Will not experience complications related to urinary retention Outcome: Progressing   Problem: Pain Managment: Goal: General experience of comfort will improve and/or be controlled Outcome: Progressing   Problem: Safety: Goal: Ability to remain free from injury will improve Outcome: Progressing

## 2024-01-17 NOTE — Plan of Care (Signed)
  Problem: Clinical Measurements: Goal: Diagnostic test results will improve Outcome: Progressing   Problem: Health Behavior/Discharge Planning: Goal: Ability to manage health-related needs will improve Outcome: Progressing

## 2024-01-17 NOTE — Progress Notes (Signed)
 PROGRESS NOTE  Kim [name]  DOB: 1941-05-21  PCP: Geofm Glade PARAS, MD FMW:[id number]  DOA: 01/14/2024  LOS: 3 days  Hospital Day: 4  Brief narrative: Kim [name] is a 83 y.o. female with PMH significant for Crohn's disease with ileostomy, CKD, osteoporosis, osteoarthritis, chronic anemia, former smoker. 8/10, patient presented to the ED from [facility] with complaint of generalized weakness productive cough, fever for 2 days. Patient lost her husband 12-26-2022 and since then has had a decrease in her appetite and at least 20 lbs unintentional weight loss.  Also reports intermittent trouble swallowing.   She had had 2 hospitalizations at [facility] for dehydration this year.    In the ED, patient had a temperature of 100.6, hemodynamically stable Labs showed WC count 9.4, hemoglobin 10.8, lactic acid normal, procalcitonin elevated to 4.28, BUN/creatinine elevated to 67/3.22 Urinalysis with hazy yellow color urine, large leukocytes Chest x-ray showed increased interstitial opacities throughout the right lung with new blunting of the right costophrenic angle. Findings are concerning for infection. Urine culture and blood culture sent Started on empiric antibiotic Admitted to [facility]  Subjective: The patient complains of being cold.  She admits that appetite is poor and has been poor for about a year.  Assessment and plan: Right-sided community-acquired pneumonia, POA Presented with shortness of breath, cough, fever  Had 1 episode of fever in the ED Chest x-ray with right sided infiltrates.  WBC count normal but procalcitonin level was elevated Ceftriaxone  and azithromycin  started on 8/10 Continue aspiration precautions.  Speech eval obtained.  Mild aspiration risk noted. Clinically feels better.  Still with some crackles in the right lung base but improving. Continue PRN antitussives and bronchodilators  Severe protein calorie malnutrition Severe muscle mass loss BMI 16 and  serum albumin 3.0 Encourage increase oral protein calorie intake Liberalize diet Dietitian consulted   AKI on CKD 4 Severe metabolic acidosis Baseline creatinine 2.41 from January 2025 Presented with creatinine elevated at 3.22 and serum bicarb of 14 secondary to dehydration  Currently on bicarb drip.  Noted gradual improvement in creatinine and bicarb in a.m. labs Continue to monitor  Recent Labs    01/23/23 1547 06/21/23 1114 01/14/24 1540 01/15/24 0510 01/16/24 0507 01/17/24 0522  BUN 37* 92* 67* 53* 45* 38*  CREATININE 1.64* 2.41* 3.22* 2.52* 2.30* 2.27*  CO2 26 22 14* 12* 18* 19*   Mild hypercalcemia Presented with calcium  level elevated to 10.6 in the setting of teriparatide use No reported symptoms of hypercalcemia Improving with IV fluid   Crohn's disease with ileostomy Follows with GI at [facility], Dr. Dudley Bradley [phone]   Anemia of chronic disease in the setting of CKD Follows with Dr. Sherrod, hematology Has had iron infusions in the past Noted drop in hemoglobin likely dilutional.  No active bleeding.    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 Neysa. Has an upcoming appointment with Dr. Neysa on Thursday, 01/18/2024.   Glaucoma Continue home regimen   Grief Husband deceased 12/26/2022 Not suicidal.  Has declined antidepressants.   Generalized weakness, multifactorial in the setting of all of the above PT OT evaluation Fall precautions       Goals of care   Code Status: Limited: Do not attempt resuscitation (DNR) -DNR-LIMITED -Do Not Intubate/DNI   -The patient continues to have extremely poor oral intake here in the hospital the past  year which has been much worse this summer-I spoke with her son Beverley and his wife today who notes that the patient has been having episodes of agitation and confusion - we  discussed that based on her recent decline and continued inability to eat or drink sufficiently to meet physiological needs, she appears to be a candidate for hospice-he states that he would be willing to transition to her to his home and continue hospice care-he is going to have further discussions with his sister today & plans on meeting tomorrow with hospice/palliative care attending  Patient is from: [facility]    Diet:  Diet Order             Diet regular Room service appropriate? Yes; Fluid consistency: Thin  Diet effective now                   Scheduled Meds:  Abaloparatide   80 mcg Subcutaneous Q2000   azithromycin   500 mg Oral Daily   brimonidine   1 drop Both Eyes TID   dorzolamide -timolol   1 drop Both Eyes BID   feeding supplement  1 Container Oral TID BM   gabapentin   600 mg Oral QHS   heparin   5,000 Units Subcutaneous Q8H   mirtazapine   7.5 mg Oral QHS   multivitamin with minerals  1 tablet Oral Daily   Netarsudil -Latanoprost   1 drop Ophthalmic QHS    PRN meds: acetaminophen , guaiFENesin , ipratropium-albuterol , loperamide , melatonin, polyethylene glycol, prochlorperazine    Infusions:   cefTRIAXone  (ROCEPHIN )  IV 2 g (01/17/24 1041)   sodium bicarbonate  75 mEq in sodium chloride  0.45 % 1,075 mL infusion 75 mL/hr at 01/17/24 1044    Antimicrobials: Anti-infectives (From admission, onward)    Start     Dose/Rate Route Frequency Ordered Stop   01/16/24 1000  azithromycin  (ZITHROMAX ) tablet 500 mg        500 mg Oral Daily 01/16/24 0849 01/19/24 0959   01/15/24 1000  cefTRIAXone  (ROCEPHIN ) 2 g in sodium chloride  0.9 % 100 mL IVPB        2 g 200 mL/hr over 30 Minutes Intravenous Every 24 hours 01/14/24 1948     01/15/24 1000  azithromycin  (ZITHROMAX ) 500 mg in sodium chloride  0.9 % 250 mL IVPB  Status:  Discontinued        500 mg 250 mL/hr over 60 Minutes Intravenous Every 24 hours 01/14/24 1948 01/16/24 0849   01/14/24 1900  cefTRIAXone  (ROCEPHIN ) 1 g in  sodium chloride  0.9 % 100 mL IVPB        1 g 200 mL/hr over 30 Minutes Intravenous  Once 01/14/24 1858 01/14/24 2005   01/14/24 1900  azithromycin  (ZITHROMAX ) 500 mg in sodium chloride  0.9 % 250 mL IVPB        500 mg 250 mL/hr over 60 Minutes Intravenous  Once 01/14/24 1858 01/14/24 2110       Objective: Vitals:   01/17/24 0547 01/17/24 1144  BP: (!) 137/46 (!) 111/39  Pulse: 80 66  Resp: 20 15  Temp: 100.1 F (37.8 C) 97.9 F (36.6 C)  SpO2: 91% 97%    Intake/Output Summary (Last 24 hours) at 01/17/2024 1830 Last data filed at 01/17/2024 0829 Gross per 24 hour  Intake 237 ml  Output 600 ml  Net -363 ml   Filed Weights   01/14/24 1448 01/14/24 2229  Weight: 33.6 kg 34.6 kg   Weight change:  Body mass index is 17.08 kg/m.   Physical Exam: General exam: Elderly female laying  in bed in no distress-is cachectic HEENT: Atraumatic, normocephalic, no obvious bleeding Lungs: Mild diminished air entry in both bases.  Mild crackles on the right lung base persists CVS: S1, S2, no murmur,   GI/Abd: Soft, nontender, nondistended, bowel sound present,   CNS: Alert, awake, oriented x 3 Extremities: No pedal edema, no calf tenderness, arthritic deformities of hands noted  Data Review: I have personally reviewed the laboratory data and studies available.  F/u labs ordered Unresulted Labs (From admission, onward)     Start     Ordered   01/16/24 0500  CBC with Differential/Platelet  Daily,   R      01/15/24 1101   01/16/24 0500  Basic metabolic panel with GFR  Daily,   R      01/15/24 1101   01/16/24 0500  Vitamin A   Tomorrow morning,   R        01/15/24 1456   01/16/24 0500  Copper , serum  Tomorrow morning,   R        01/15/24 1456   01/16/24 0500  Zinc   Tomorrow morning,   R        01/15/24 1456            Signed, True Atlas, MD [facility] 01/17/2024

## 2024-01-18 ENCOUNTER — Inpatient Hospital Stay: Payer: Self-pay

## 2024-01-18 ENCOUNTER — Ambulatory Visit: Payer: Self-pay

## 2024-01-18 ENCOUNTER — Ambulatory Visit: Payer: Self-pay | Admitting: Pulmonary Disease

## 2024-01-18 DIAGNOSIS — E860 Dehydration: Secondary | ICD-10-CM

## 2024-01-18 DIAGNOSIS — Z711 Person with feared health complaint in whom no diagnosis is made: Secondary | ICD-10-CM

## 2024-01-18 DIAGNOSIS — Z7189 Other specified counseling: Secondary | ICD-10-CM

## 2024-01-18 DIAGNOSIS — N179 Acute kidney failure, unspecified: Secondary | ICD-10-CM | POA: Diagnosis not present

## 2024-01-18 DIAGNOSIS — R4589 Other symptoms and signs involving emotional state: Secondary | ICD-10-CM

## 2024-01-18 DIAGNOSIS — Z66 Do not resuscitate: Secondary | ICD-10-CM

## 2024-01-18 DIAGNOSIS — E43 Unspecified severe protein-calorie malnutrition: Secondary | ICD-10-CM

## 2024-01-18 DIAGNOSIS — J189 Pneumonia, unspecified organism: Secondary | ICD-10-CM

## 2024-01-18 DIAGNOSIS — Z515 Encounter for palliative care: Secondary | ICD-10-CM

## 2024-01-18 LAB — CBC WITH DIFFERENTIAL/PLATELET
Abs Immature Granulocytes: 0.17 K/uL — ABNORMAL HIGH (ref 0.00–0.07)
Basophils Absolute: 0 K/uL (ref 0.0–0.1)
Basophils Relative: 0 %
Eosinophils Absolute: 0.1 K/uL (ref 0.0–0.5)
Eosinophils Relative: 1 %
HCT: 25.5 % — ABNORMAL LOW (ref 36.0–46.0)
Hemoglobin: 8 g/dL — ABNORMAL LOW (ref 12.0–15.0)
Immature Granulocytes: 1 %
Lymphocytes Relative: 6 %
Lymphs Abs: 0.8 K/uL (ref 0.7–4.0)
MCH: 30.1 pg (ref 26.0–34.0)
MCHC: 31.4 g/dL (ref 30.0–36.0)
MCV: 95.9 fL (ref 80.0–100.0)
Monocytes Absolute: 0.7 K/uL (ref 0.1–1.0)
Monocytes Relative: 5 %
Neutro Abs: 12 K/uL — ABNORMAL HIGH (ref 1.7–7.7)
Neutrophils Relative %: 87 %
Platelets: 238 K/uL (ref 150–400)
RBC: 2.66 MIL/uL — ABNORMAL LOW (ref 3.87–5.11)
RDW: 14.4 % (ref 11.5–15.5)
WBC: 13.8 K/uL — ABNORMAL HIGH (ref 4.0–10.5)
nRBC: 0 % (ref 0.0–0.2)

## 2024-01-18 LAB — BASIC METABOLIC PANEL WITH GFR
Anion gap: 11 (ref 5–15)
BUN: 40 mg/dL — ABNORMAL HIGH (ref 8–23)
CO2: 22 mmol/L (ref 22–32)
Calcium: 8.6 mg/dL — ABNORMAL LOW (ref 8.9–10.3)
Chloride: 106 mmol/L (ref 98–111)
Creatinine, Ser: 2.29 mg/dL — ABNORMAL HIGH (ref 0.44–1.00)
GFR, Estimated: 21 mL/min — ABNORMAL LOW (ref >60)
Glucose, Bld: 102 mg/dL — ABNORMAL HIGH (ref 70–99)
Potassium: 3.3 mmol/L — ABNORMAL LOW (ref 3.5–5.1)
Sodium: 139 mmol/L (ref 135–145)

## 2024-01-18 LAB — COPPER, SERUM: Copper: 120 ug/dL (ref 80–158)

## 2024-01-18 LAB — ZINC: Zinc: 40 ug/dL — ABNORMAL LOW (ref 44–115)

## 2024-01-18 LAB — MAGNESIUM: Magnesium: 1.6 mg/dL — ABNORMAL LOW (ref 1.7–2.4)

## 2024-01-18 MED ORDER — GLYCOPYRROLATE 1 MG PO TABS: 1.0000 mg | ORAL_TABLET | ORAL | Status: DC | PRN

## 2024-01-18 MED ORDER — HALOPERIDOL LACTATE 5 MG/ML IJ SOLN: 1.0000 mg | INTRAMUSCULAR | Status: DC | PRN

## 2024-01-18 MED ORDER — GLYCOPYRROLATE 0.2 MG/ML IJ SOLN: 0.2000 mg | INTRAMUSCULAR | Status: DC | PRN

## 2024-01-18 MED ORDER — OXYCODONE HCL 20 MG/ML PO CONC
5.0000 mg | ORAL | Status: DC | PRN
  Filled 2024-01-18: qty 0.3

## 2024-01-18 MED ORDER — CARMEX CLASSIC LIP BALM EX OINT
1.0000 | TOPICAL_OINTMENT | CUTANEOUS | Status: DC | PRN
  Administered 2024-01-18: 1 via TOPICAL
  Filled 2024-01-18: qty 10

## 2024-01-18 MED ORDER — HALOPERIDOL 1 MG PO TABS: 1.0000 mg | ORAL_TABLET | ORAL | Status: DC | PRN

## 2024-01-18 MED ORDER — HALOPERIDOL LACTATE 2 MG/ML PO CONC
1.0000 mg | ORAL | Status: DC | PRN
Start: 2024-01-18 — End: 2024-01-19

## 2024-01-18 MED ORDER — GLYCOPYRROLATE 0.2 MG/ML IJ SOLN
0.2000 mg | INTRAMUSCULAR | Status: DC | PRN
Start: 2024-01-18 — End: 2024-01-19

## 2024-01-18 MED ORDER — POTASSIUM CHLORIDE CRYS ER 20 MEQ PO TBCR
40.0000 meq | EXTENDED_RELEASE_TABLET | ORAL | Status: DC
  Filled 2024-01-18: qty 2

## 2024-01-18 MED ORDER — POLYVINYL ALCOHOL 1.4 % OP SOLN: 1.0000 [drp] | Freq: Four times a day (QID) | OPHTHALMIC | Status: DC | PRN

## 2024-01-18 MED ORDER — BIOTENE DRY MOUTH MT LIQD: 15.0000 mL | OROMUCOSAL | Status: DC | PRN

## 2024-01-18 NOTE — Plan of Care (Signed)

## 2024-01-18 NOTE — Plan of Care (Signed)

## 2024-01-18 NOTE — Progress Notes (Signed)
 Daily Progress Note   Patient Name: Kim [name]       Date: 01/18/2024 DOB: 14-May-1941  Age: 83 y.o. MRN#: [id number] Attending Physician: Rizwan, Saima, MD Primary Care Physician: Geofm Glade PARAS, MD Admit Date: 01/14/2024 Length of Stay: 4 days  Reason for Consultation/Follow-up: Establishing goals of care  Subjective:   CC: Patient notes she feels she is going to die.  Palliative medicine team consulted to assist with complex medical decision making.  Subjective:  Reviewed EMR prior to presenting to bedside including recent documentation from [facility] and hospitalist.  Had discussed care with RN and able to set up meeting with family today at 10 AM as per family's request.  Presented to bedside at time of meeting to discuss care.  Patient laying comfortably in bed.  Patient's son and daughter-in-law present at bedside.  Patient daughter was able to join conversation via speaker phone.  Introduced myself as a member of the palliative medicine team.  Discussed patient's medical journey up into this point.  Patient was able to engage in conversation at times though deferred most history to her son and daughter.  During conversation discussed patient's continued lack of appetite and weight loss.  Patient herself noted that she feels she is going to die.  Patient has expressed to family that she just wants to go to sleep and not suffer anymore.  Acknowledged difficulties in patient's situation. With permission able to discuss care planning moving forward.  Patient has noted that she no longer wants to be poked and prodded.  Patient wants to be allowed to rest and focus on quality time with family.  Patient would like to be able to return to her home in [place] [facility].  Son noted he has taken time off from work to plan on being patient's 24/7 caregiver there.  Admission, introduced hospice philosophy.  Discussed what hospice support would and would not look like moving forward.  Family noted they  were willing to hire caregivers to assist son in caring for patient at her home with hospice support.  After discussion, patient and family agreeing with hospice referral.  Noted would involve TOC to provide choice.  Son did request that patient would not want hospice associated with [facility] facility due to tensionless relationships there.  Noted would inform TOC to assist with coordination of care.  Also discussed patient's care while continuing management here at the hospital.  Patient has voiced that she no longer wants to go through aggressive medical interventions.  Discussed what transition to comfort focused care at this time would entail.  Noted would discontinue antibiotics, IV fluids, and imaging and instead provide medications for comfort and management of symptoms such as pain and shortness of breath.  Spent time explaining why IV fluids are not considered a comfort measure and concern is that when people are reaching end-of-life should patients receive IV fluid, can go into locations of children such as long causing worsening symptom burden.  Patient and family agreeing with discontinuing aggressive medical interventions and transition to full comfort focused care at this time.  Spent time providing emotional support via active listening.  All questions answered at that time.  Noted palliative medicine team continue to follow patient's medical journey.  Discussed care with hospitalist, TOC, and RN to coordinate care after visit with transition to comfort focused care and goal to get patient home with hospice.  Objective:   Vital Signs:  BP (!) 135/50 (BP Location: Right Arm)   Pulse 86  Temp 98.3 F (36.8 C)   Resp 20   Ht 4' 8 (1.422 m)   Wt 34.6 kg Comment: Pt family stated that the patient weighed 68lbs last week standing on the scale at the drs office  SpO2 97%   BMI 17.08 kg/m   Physical Exam: General: NAD, awake, cachectic, frail HENT: dry mucous  membranes Cardiovascular: RRR Respiratory: no increased work of breathing noted, not in respiratory distress Abdomen: not distended Neuro: Awake, interactive, defers questioning to her son Psych: appropriately answers all questions  Assessment & Plan:   Assessment: Patient is an 83 year old female with a past medical history of chron's disease with ileostomy, CKD stage IV, osteoporosis, osteoarthritis, former tobacco use, and chronic anemia who was admitted on 01/14/2024 from [facility] for management of generalized weakness, productive cough, and fever.  Patient having multiple hospitalizations this year for management of dehydration in setting of lack of oral intake and unintentional weight loss since losing her husband a year ago.  During hospitalization patient has received management for pneumonia, AKI on CKD 4, and severe protein calorie malnutrition.  Palliative medicine team consulted to assist with complex medical decision making.  Recommendations/Plan: # Complex medical decision making/goals of care:  -Discussed care with patient, son, and daughter-in-law at bedside today with patient's daughter on speaker phone as detailed above in HPI.  Patient has continued to medically deteriorate and is no longer wants to pursue aggressive medical interventions.  After discussion planning for patient to return to [facility] with hospice support.  Patient has also expressed that she no longer wants to receive aggressive medical interventions here in the hospital and family is supporting of this.  Transitioning to full comfort focused care at this time we will plan to get patient back to ILF with hospice.  -At this time we will discontinue interventions that are no longer focused on comfort such as IV fluids, imaging, or lab work.  Will instead focus on symptom management of pain, dyspnea, and agitation in the setting of end-of-life care.    Code Status: Do not attempt resuscitation (DNR) -  Comfort care  # Symptom management Patient is receiving these palliative interventions for symptom management with an intent to improve quality of life.     -Pain/Dyspnea, acute in the setting of end-of-life care                               -Continue gabapentin  600 mg nightly   - Start oxycodone  5 mg every 2 hours as needed                 -Anxiety/agitation, in the setting of end-of-life care                               -Start Haldol  0.5 mg every 4 hours as needed. Continue to adjust based on patient's symptom burden.                   -Secretions, in the setting of end-of-life care                               -Start glycopyrrolate  as needed.  # Psychosocial Support:  - Son, daughter, daughter-in-law  # Discharge Planning: Home with Hospice, patient resides in [place] [facility]  - [facility] assisting with discharge coordination  Discussed with: Patient, patient's son, patient's daughter-in-law, patient's daughter, TOC, RN, hospitalist  Thank you for allowing the palliative care team to participate in the care Kim [name].  Tinnie Radar, DO Palliative Care Provider PMT # [phone]  If patient remains symptomatic despite maximum doses, please call PMT at [phone] between 0700 and 1900. Outside of these hours, please call attending, as PMT does not have night coverage.   Personally spent 65 minutes in patient care including extensive chart review (labs, imaging, progress/consult notes, vital signs), medically appropraite exam, discussed with treatment team, education to patient, family, and staff, documenting clinical information, medication review and management, coordination of care, and available advanced directive documents.

## 2024-01-18 NOTE — Progress Notes (Signed)
 Physical Therapy Discharge Patient Details Name: Kim [name] MRN: [id number] DOB: 1941/04/26 Today's Date: 01/18/2024 Time:  -     Patient discharged from PT services secondary to medical decline - Now on full comfort care. Please see latest therapy progress note for current level of functioning and progress toward goals.    Progress and discharge plan discussed with patient and/or caregiver: Patient unable to participate in discharge planning and no caregivers available  GP    [name] PT Acute Rehabilitation Services Office [phone]  [name] Norris 01/18/2024, 10:39 AM

## 2024-01-18 NOTE — TOC Progression Note (Addendum)
 Transition of Care [facility]) - Progression Note    Patient Details  Name: Kim [name] MRN: [id number] Date of Birth: 01-13-1941  Transition of Care [facility]) CM/SW Contact  Sonda Manuella Quill, RN Phone Number: 01/18/2024, 12:45 PM  Clinical Narrative:    [facility] consulted for list of hospice agencies; spoke w/ pt, son Julissa Browning, and family in room; pt is from [place]; they would like facility not affiliated w/ facility; list of agencies given from [email or web address]; also LVM for [place] at [place] for name of servicing hospice; awaiting return call, and pt/family choice.  -[phone]- LVM for [place], Admissions and  Designer, fashion/clothing at [employer]; awaiting return call  -[phone]- return call from [place]; he says [facility] is servicing agency for facility; will notify pt's son  -[phone]- Pt's son notified [facility] provides services for [employer]; he said he spoke w/ his sister and pt's info will not be shared w/ facility unless family shares; he would like to proceed w/ care from [facility] hospice; Eleanor Peel, Hospital Liaison notified via secure chat; agency will arrange delivery of DME to home.  Expected Discharge Plan: Home w Home Health Services Barriers to Discharge: Continued Medical Work up               Expected Discharge Plan and Services In-house Referral: Clinical Social Work Discharge Planning Services: NA Post Acute Care Choice: Home Health Living arrangements for the past 2 months: Independent Living Facility                 DME Arranged: N/A DME Agency: NA       HH Arranged: PT, OT           Social Drivers of Health (SDOH) Interventions SDOH Screenings   Food Insecurity: No Food Insecurity (01/14/2024)  Housing: Low Risk  (01/14/2024)  Transportation Needs: No Transportation Needs (01/14/2024)  Utilities: Not At Risk (01/14/2024)  Alcohol  Screen: Low Risk  (01/17/2023)  Financial Resource Strain: Patient Unable To Answer (12/25/2023)   Received from [facility]  Physical Activity: Sufficiently Active (01/17/2023)  Social Connections: Moderately Isolated (01/14/2024)  Stress: Stress Concern Present (01/17/2023)  Tobacco Use: Medium Risk (01/14/2024)  Health Literacy: Adequate Health Literacy (01/17/2023)    Readmission Risk Interventions    01/15/2024    3:32 PM  Readmission Risk Prevention Plan  Transportation Screening Complete  PCP or Specialist Appt within 5-7 Days Complete  Home Care Screening Complete  Medication Review (RN CM) Complete

## 2024-01-18 NOTE — Progress Notes (Signed)
 Nutrition Brief Note  Chart reviewed. Pt now transitioning to comfort care.  No further nutrition interventions planned at this time.    Morna Lee, MS, RD, LDN Inpatient Clinical Dietitian Contact via Secure chat

## 2024-01-18 NOTE — Progress Notes (Signed)
 [facility] Liaison Note  This is a current patient with [facility], followed for outpatient palliative care. Family has requested to transition to home hospice services.   Spoke with patient's son and daughter to initiate education related to hospice philosophy, services and team approach to care. Family verbalized understanding of information given. Per discussion, the plan is for discharge home tomorrow.   DME needs discussed. Patient has the following equipment in the home: none Family requests the following equipment for delivery: hospital bed  Please send signed and completed DNR home with patient/family. Please provide prescriptions at discharge as needed to ensure ongoing symptom management.  [facility] information and contact numbers given to [place]. Please call with any concerns.  Thank you for the opportunity to participate in this patient's care.   Eleanor Nail, LPN [facility] Liaison [phone]

## 2024-01-18 NOTE — Progress Notes (Signed)
 PROGRESS NOTE  Kim [name]  DOB: 08/25/1940  PCP: Geofm Glade PARAS, MD FMW:[id number]  DOA: 01/14/2024  LOS: 4 days  Hospital Day: 5  Brief narrative: Kim [name] is a 83 y.o. female with PMH significant for Crohn's disease with ileostomy, CKD, osteoporosis, osteoarthritis, chronic anemia, former smoker. 8/10, patient presented to the ED from [facility] IL[facility] with complaint of generalized weakness productive cough, fever for 2 days. Patient lost her husband 2022-12-22 and since then has had a decrease in her appetite and at least 20 lbs unintentional weight loss.  Also reports intermittent trouble swallowing.   She had had 2 hospitalizations at [facility] for dehydration this year.    In the ED, patient had a temperature of 100.6, hemodynamically stable Labs showed WC count 9.4, hemoglobin 10.8, lactic acid normal, procalcitonin elevated to 4.28, BUN/creatinine elevated to 67/3.22 Urinalysis with hazy yellow color urine, large leukocytes Chest x-ray showed increased interstitial opacities throughout the right lung with new blunting of the right costophrenic angle. Findings are concerning for infection. Urine culture and blood culture sent Started on empiric antibiotic Admitted to [facility]  Subjective: She states today that she has decided to die because she is unable to drink the amount of fluid that she has been recommended to drink (50 oz). She states she has lived a long life and has loving family to support her.   Assessment and plan: Right-sided community-acquired pneumonia, POA Presented with shortness of breath, cough, fever  Had 1 episode of fever in the ED Chest x-ray with right sided infiltrates.  WBC count normal but procalcitonin level was elevated Ceftriaxone  and azithromycin  started on 8/10- topping today    Severe protein calorie malnutrition Severe muscle mass loss BMI 16 and serum albumin 3.0    AKI on CKD 4 Severe metabolic acidosis Baseline creatinine 2.41 from  January 2025 Presented with creatinine elevated at 3.22 and serum bicarb of 14 secondary to dehydration  Treated with Bicarb infusion.    Mild hypercalcemia Presented with calcium  level elevated to 10.6 in the setting of teriparatide use    Crohn's disease with ileostomy Follows with GI at [facility], Dr. Dudley Bradley [phone]   Anemia of chronic disease in the setting of CKD Follows with Dr. Sherrod, hematology Has had iron infusions in the past Noted drop in hemoglobin likely dilutional.  No active bleeding.    Severe osteoporosis Continue home teriparatide   Cluster of nodules in the right lower lung Former smoker, quit more than 50 years ago Follows with [facility], Dr. Neysa. Has an upcoming appointment with Dr. Neysa on Thursday, 01/18/2024.   Glaucoma Continue home regimen   Grief Husband deceased 22-Dec-2022 Not suicidal.  Has declined antidepressants.   Generalized weakness, multifactorial in the setting of all of the above         Goals of care   Code Status: Do not attempt resuscitation (DNR) - Comfort care  She has been transitioned to comfort care with plans to live with her son and daughter in law. Awaiting hospital bed to be delivered to the home  Patient is from: [facility]    Diet:  Diet Order             Diet regular Room service appropriate? Yes; Fluid consistency: Thin  Diet effective now                   Scheduled Meds:  azithromycin   500 mg Oral Daily   brimonidine   1 drop  Both Eyes TID   dorzolamide -timolol   1 drop Both Eyes BID   feeding supplement  1 Container Oral TID BM   gabapentin   600 mg Oral QHS   Netarsudil -Latanoprost   1 drop Ophthalmic QHS       Objective: Vitals:   01/18/24 0436 01/18/24 1354  BP: (!) 135/50 (!) 118/47  Pulse: 86 79  Resp: 20 19  Temp: 98.3 F (36.8 C) 98 F (36.7 C)  SpO2:  93%    Intake/Output Summary (Last 24 hours) at 01/18/2024 1907 Last data filed at 01/18/2024  1451 Gross per 24 hour  Intake --  Output 1025 ml  Net -1025 ml   Filed Weights   01/14/24 1448 01/14/24 2229  Weight: 33.6 kg 34.6 kg   Weight change:  Body mass index is 17.08 kg/m.   Physical Exam: General exam: Elderly female laying in bed in no distress-is cachectic HEENT: Atraumatic, normocephalic, no obvious bleeding  CNS: Alert, awake, oriented x 3    Data Review: I have personally reviewed the laboratory data and studies available.      Signed, True Atlas, MD [facility] 01/18/2024

## 2024-01-18 NOTE — Progress Notes (Addendum)
 Chaplain responded to spiritual consult. Pt Kim [name] welcomed me, but expressed that what she needs is a miracle, not a Orthoptist. She was able to talk about her acceptance of death, however. Family also expressing no spiritual needs at this time, however they are aware we remain available if and whenever needed.  Kim [name] also states that she is Jewish (as opposed to non-denominational, as listed on the chart)

## 2024-01-18 NOTE — Plan of Care (Signed)

## 2024-01-19 DIAGNOSIS — N179 Acute kidney failure, unspecified: Secondary | ICD-10-CM | POA: Diagnosis not present

## 2024-01-19 LAB — CULTURE, BLOOD (ROUTINE X 2)
Culture: NO GROWTH
Special Requests: ADEQUATE

## 2024-01-19 LAB — VITAMIN A: Vitamin A (Retinoic Acid): 8.9 ug/dL — ABNORMAL LOW (ref 22.0–69.5)

## 2024-01-19 MED ORDER — POLYETHYLENE GLYCOL 3350 17 G PO PACK: 17.0000 g | PACK | Freq: Every day | ORAL | Status: DC | PRN

## 2024-01-19 NOTE — TOC Transition Note (Signed)
 Transition of Care [facility]) - Discharge Note  Patient Details  Name: Kim [name] MRN: [id number] Date of Birth: [birth year]  Transition of Care [facility]) CM/SW Contact:  Duwaine GORMAN Aran, LCSW Phone Number: 01/19/2024, 2:32 PM  Clinical Narrative: DME has been delivered to patient's home. Medical necessity form done; PTAR scheduled. Discharge packet completed. Son updated regarding transportation. Care management signing off.  Final next level of care: Home w Hospice Care Barriers to Discharge: Barriers Resolved  Patient Goals and CMS Choice Patient states their goals for this hospitalization and ongoing recovery are:: To return to [facility] ILF Choice offered to / list presented to : NA  Discharge Placement Patient to be transferred to facility by: PTAR Name of family member notified: Arriyanna Mersch (son) Patient and family notified of of transfer: 01/19/24  Discharge Plan and Services Additional resources added to the After Visit Summary for   In-house Referral: Clinical Social Work Discharge Planning Services: NA Post Acute Care Choice: Home Health          DME Arranged: N/A DME Agency: NA  Social Drivers of Health (SDOH) Interventions SDOH Screenings   Food Insecurity: No Food Insecurity (01/14/2024)  Housing: Low Risk  (01/14/2024)  Transportation Needs: No Transportation Needs (01/14/2024)  Utilities: Not At Risk (01/14/2024)  Alcohol  Screen: Low Risk  (01/17/2023)  Financial Resource Strain: Patient Unable To Answer (12/25/2023)   Received from [facility]  Physical Activity: Sufficiently Active (01/17/2023)  Social Connections: Moderately Isolated (01/14/2024)  Stress: Stress Concern Present (01/17/2023)  Tobacco Use: Medium Risk (01/14/2024)  Health Literacy: Adequate Health Literacy (01/17/2023)   Readmission Risk Interventions    01/15/2024    3:32 PM  Readmission Risk Prevention Plan  Transportation Screening Complete  PCP or Specialist Appt within 5-7  Days Complete  Home Care Screening Complete  Medication Review (RN CM) Complete

## 2024-01-19 NOTE — Discharge Summary (Signed)
 Physician Discharge Summary  Kim [name] FMW:[id number] DOB: 1940-11-15 DOA: 01/14/2024  PCP: Geofm Glade PARAS, MD  Admit date: 01/14/2024 Discharge date: 01/19/2024 Discharging to: [place] [facility] Recommendations for Outpatient Follow-up:  [facility] to provide hospice care  Consults:  Palliative care       Brief narrative: Kim [name] is a 83 y.o. female with PMH significant for Crohn's disease with ileostomy, CKD, osteoporosis, osteoarthritis, chronic anemia, former smoker. 8/10, patient presented to the ED from [facility] IL[facility] with complaint of generalized weakness productive cough, fever for 2 days. Patient lost her husband 2022/12/03 and since then has had a decrease in her appetite and at least 20 lbs unintentional weight loss.  Also reports intermittent trouble swallowing.   She had had 2 hospitalizations at [facility] for dehydration this year.     In the ED, patient had a temperature of 100.6, hemodynamically stable Labs showed WC count 9.4, hemoglobin 10.8, lactic acid normal, procalcitonin elevated to 4.28, BUN/creatinine elevated to 67/3.22 Urinalysis with hazy yellow color urine, large leukocytes Chest x-ray showed increased interstitial opacities throughout the right lung with new blunting of the right costophrenic angle. Findings are concerning for infection. Urine culture and blood culture sent Started on empiric antibiotic Admitted to [facility] Due to continued poor oral intake especially of fluids, the patient decided that she no longer wanted aggressive treatments and opted to to transition to a comfort care approach. This decision was supported by her son and daughter and was finalized with the patient and her family on 8/14. She will return to [facility] [place] with hospice care today.    Assessment and plan: Right-sided community-acquired pneumonia, POA Presented with shortness of breath, cough, fever  Had 1 episode of fever in the ED Chest x-ray with right sided  infiltrates.  WBC count normal but procalcitonin level was elevated   Severe protein calorie malnutrition Severe muscle mass loss BMI 16 and serum albumin 3.0   AKI on CKD 4 Severe metabolic acidosis Baseline creatinine 2.41 from January 2025 Presented with creatinine elevated at 3.22 and serum bicarb of 14 secondary to dehydration  Treated with Bicarb infusion.     Mild hypercalcemia Presented with calcium  level elevated to 10.6 in the setting of teriparatide use    Crohn's disease with ileostomy Follows with GI at [facility], Dr. Dudley Bradley [phone]   Anemia of chronic disease in the setting of CKD Follows with Dr. Sherrod, hematology Has had iron infusions in the past Noted drop in hemoglobin likely dilutional.  No active bleeding.     Severe osteoporosis Continue home teriparatide   Cluster of nodules in the right lower lung Former smoker, quit more than 50 years ago Follows with [facility], Dr. Neysa. Has an upcoming appointment with Dr. Neysa on Thursday, 01/18/2024.   Glaucoma Continue home regimen   Grief Husband deceased 12-03-22 Not suicidal.  Has declined antidepressants.   Generalized weakness, multifactorial in the setting of all of the above        Discharge Instructions  Discharge Instructions     Discharge patient   Complete by: As directed    Home with hospice   Discharge disposition: 01-Home or Self Care   Discharge patient date: 01/19/2024      Allergies as of 01/19/2024       Reactions   Antihistamines, Chlorpheniramine-type Other (See Comments)   Severe dry mouth (speech-limiting)   Chlorpheniramine Other (See Comments)   Severe dry mouth (speech-limiting), dry eyes   Metronidazole Other (See  Comments)   Peripheral neuropathy   Tape Other (See Comments)   Severe skin tearing (tolerates paper tape and coban only)   Wound Dressing Adhesive Other (See Comments)   Severe skin tearing (tolerates paper tape and coban only)    Other Diarrhea, Nausea And Vomiting   Patient has an Ileostomy Patient has an Energy manager, Antihistamine    Sulfa Antibiotics Other (See Comments)   Reaction type/severity unknown   Chlorhexidine  Itching, Rash   Hydrocodone -acetaminophen  Nausea And Vomiting        Medication List     STOP taking these medications    Calcium  Citrate-Vitamin D  200-125 MG-UNIT Tabs   cyanocobalamin  1000 MCG/ML injection Commonly known as: VITAMIN B12   Tymlos  3120 MCG/1.56ML Sopn Generic drug: Abaloparatide    Vitamin D3 50 MCG (2000 UT) Tabs       TAKE these medications    acetaminophen  500 MG tablet Commonly known as: TYLENOL  Take 500-1,000 mg by mouth in the morning, at noon, and at bedtime. Take 500mg  in the morning, 500mg  with lunch, and 1000mg  in the evening for a total daily dose of 2000mg    B-D 3CC LUER-LOK SYR 22GX1-1/2 22G X 1-1/2 3 ML Misc Generic drug: SYRINGE-NEEDLE (DISP) 3 ML USE AS DIRECTED   B-D 3CC LUER-LOK SYR 25GX5/8 25G X 5/8 3 ML Misc Generic drug: SYRINGE-NEEDLE (DISP) 3 ML USE AS DIRECTED.   brimonidine  0.2 % ophthalmic solution Commonly known as: ALPHAGAN  Place 1 drop into both eyes See admin instructions. Instill 1 drop into both eyes in the morning, afternoon, and evening   dorzolamide -timolol  2-0.5 % ophthalmic solution Commonly known as: COSOPT  Place 1 drop into both eyes See admin instructions. Instill 1 drop into both eyes in the morning and afternoon   Fluocinolone Acetonide Body 0.01 % Oil Apply topically.   gabapentin  300 MG capsule Commonly known as: NEURONTIN  Take 600 mg by mouth at bedtime.   loperamide  2 MG capsule Commonly known as: IMODIUM  Take 2 mg by mouth as needed for diarrhea or loose stools.   loratadine 10 MG tablet Commonly known as: CLARITIN Take 10 mg by mouth daily. [place]   Melatonin 10 MG Tbcr Take 10 mg by mouth at bedtime.   One-A-Day Womens 50+ Advantage Tabs Take 1 tablet by mouth  daily.   polyethylene glycol 17 g packet Commonly known as: MIRALAX  / GLYCOLAX  Take 17 g by mouth daily as needed for mild constipation.   Rocklatan  0.02-0.005 % Soln Generic drug: Netarsudil -Latanoprost  Place 1 drop into both eyes at bedtime.   triamcinolone cream 0.1 % Commonly known as: KENALOG Apply 1 application  topically every other day.        Follow-up Information     [facility], [facility] Follow up.   Specialty: Home Health Services Why: This provider will reach out to you in 24-48 hours after discharge to begin home health PT/OT services. Contact information: [address] [place] [address] [phone]                    The results of significant diagnostics from this hospitalization (including imaging, microbiology, ancillary and laboratory) are listed below for reference.    DG Chest Portable 1 View Result Date: 01/14/2024 CLINICAL DATA:  Cough and weakness EXAM: PORTABLE CHEST 1 VIEW COMPARISON:  Chest x-ray 12/26/2022.  CT of the chest 03/17/2023. FINDINGS: There scattered interstitial opacities throughout the entire right lung which have increased from prior. Cluster of nodules in the right  lower lung again seen. Bronchiectasis in the right upper lobe is again seen. The left lung appears clear. There is blunting of the right costophrenic angle, a new finding. There is no pneumothorax. The cardiomediastinal silhouette is within normal limits. No acute fractures are identified. IMPRESSION: 1. Increased interstitial opacities throughout the right lung with new blunting of the right costophrenic angle. Findings are concerning for infection. 2. Stable cluster of nodules in the right lower lung. 3. Questionable trace right pleural effusion. Electronically Signed   By: Greig Pique M.D.   On: 01/14/2024 15:58   DG Shoulder Left Result Date: 01/03/2024 CLINICAL DATA:  Fall.  Shoulder pain. EXAM: LEFT SHOULDER - 2+ VIEW COMPARISON:  01/03/2024,  10:02 a.m. FINDINGS: No acute fracture or dislocation. No aggressive osseous lesion. Glenohumeral and acromioclavicular joints are normal in alignment. Moderate osteoarthritis of the acromioclavicular joint. Mild osteoarthritis of the glenohumeral joint. Linear calcification noted along the articular cartilage of the humeral head, suggesting chondrocalcinosis. No soft tissue swelling. No radiopaque foreign bodies. IMPRESSION: No acute osseous abnormality of the left shoulder joint. Electronically Signed   By: Ree Molt M.D.   On: 01/03/2024 11:28   DG Shoulder 1 View Left Result Date: 01/03/2024 CLINICAL DATA:  Fall EXAM: LEFT SHOULDER COMPARISON:  None Available. FINDINGS: Single AP view of the shoulder. No dislocation appreciated. Curvilinear lucency along the humeral head. There is no evidence of arthropathy. Aortic atherosclerosis. IMPRESSION: Curvilinear lucency along the humeral head superiorly, which may be artifactual due to patient positioning. However, on this single AP view, a subtle fracture can not be excluded and dedicated complete shoulder radiographs recommended. Electronically Signed   By: Rogelia Myers M.D.   On: 01/03/2024 10:36   Labs:   Basic Metabolic Panel: Recent Labs  Lab 01/14/24 1540 01/15/24 0510 01/16/24 0507 01/17/24 0522 01/17/24 1705 01/18/24 0504  NA 135 136 139 138  --  139  K 3.7 3.5 2.6* 2.9* 4.7 3.3*  CL 108 111 110 108  --  106  CO2 14* 12* 18* 19*  --  22  GLUCOSE 111* 98 104* 107*  --  102*  BUN 67* 53* 45* 38*  --  40*  CREATININE 3.22* 2.52* 2.30* 2.27*  --  2.29*  CALCIUM  10.6* 9.0 9.3 9.4  --  8.6*  MG  --  1.3* 2.0 1.9  --  1.6*  PHOS  --  2.9 2.8 2.6  --   --      CBC: Recent Labs  Lab 01/14/24 1540 01/15/24 0706 01/16/24 0507 01/17/24 0522 01/18/24 0504  WBC 9.4 8.9 9.9 10.9* 13.8*  NEUTROABS  --   --  8.4* 9.2* 12.0*  HGB 10.8* 8.0* 7.8* 8.3* 8.0*  HCT 34.5* 26.6* 25.3* 26.2* 25.5*  MCV 97.2 91.1 96.9 95.3 95.9  PLT 261  205 193 229 238         SIGNED:   True Atlas, MD  [facility] 01/19/2024, 9:48 AM Time taking on discharge: 50 minutes

## 2024-01-19 NOTE — Plan of Care (Signed)

## 2024-01-19 NOTE — Progress Notes (Signed)
 Daily Progress Note   Patient Name: Kim [name]       Date: 01/19/2024 DOB: Feb 03, 1941  Age: 83 y.o. MRN#: [id number] Attending Physician: Rizwan, Saima, MD Primary Care Physician: Geofm Glade PARAS, MD Admit Date: 01/14/2024 Length of Stay: 5 days  Reason for Consultation/Follow-up: Establishing goals of care  Subjective:   CC: Patient looking forward to going home today.  Palliative medicine team consulted to assist with complex medical decision making.  Subjective:  Reviewed EMR including recent documentation from hospice, TOC, and hospitalist.  Plan is for patient to discharge back to independent living facility today with hospice support.  Family planning to provide 24/7 support as well.  At time of EMR review in past 24 hours patient has not required any as needed medications for symptom management. Discussed care with RN for updates.  Presented to bedside to see patient.  Patient laying comfortably in bed and welcoming provider at this time.  Daughter-in-law present at bedside.  Patient noted looking forward to going home today.  Spent time providing emotional support via active listening.  All questions answered at that time.  Noted palliative medicine team would be available if needed.  Objective:   Vital Signs:  BP (!) 139/55 (BP Location: Right Arm)   Pulse 87   Temp 98.6 F (37 C)   Resp 17   Ht 4' 8 (1.422 m)   Wt 34.6 kg Comment: Pt family stated that the patient weighed 68lbs last week standing on the scale at the drs office  SpO2 92%   BMI 17.08 kg/m   Physical Exam: General: NAD, awake, cachectic, frail HENT: dry mucous membranes Cardiovascular: RRR Respiratory: no increased work of breathing noted, not in respiratory distress Abdomen: not distended Neuro: Awake, interactive, defers questioning to her son Psych: appropriately answers all questions  Assessment & Plan:   Assessment: Patient is an 83 year old female with a past medical history of chron's  disease with ileostomy, CKD stage IV, osteoporosis, osteoarthritis, former tobacco use, and chronic anemia who was admitted on 01/14/2024 from [facility] for management of generalized weakness, productive cough, and fever.  Patient having multiple hospitalizations this year for management of dehydration in setting of lack of oral intake and unintentional weight loss since losing her husband a year ago.  During hospitalization patient has received management for pneumonia, AKI on CKD 4, and severe protein calorie malnutrition.  Palliative medicine team consulted to assist with complex medical decision making.  Recommendations/Plan: # Complex medical decision making/goals of care:  -Discussed care with patient, son, daughter, and daughter-in-law extensively on 01/18/2024.  Patient had expressed that she no longer wants to receive aggressive medical interventions here in the hospital and family is supporting of this.  Was transitioned to full comfort focused care at that time.  Plan is for patient to go home with hospice support today.  - Already discontinued interventions that are no longer focused on comfort such as IV fluids, imaging, or lab work.  Focusing on symptom management of pain, dyspnea, and agitation in the setting of end-of-life care.    Code Status: Do not attempt resuscitation (DNR) - Comfort care   - Placed sign DNR on patient's paper chart for discharge.  # Symptom management Patient is receiving these palliative interventions for symptom management with an intent to improve quality of life.     -Pain/Dyspnea, acute in the setting of end-of-life care                               -  Continue gabapentin  600 mg nightly   - Continue oxycodone  5 mg every 2 hours as needed                 -Anxiety/agitation, in the setting of end-of-life care                               - Continue Haldol  0.5 mg every 4 hours as needed. Continue to adjust based on patient's symptom burden.                    -Secretions, in the setting of end-of-life care                               - Continue glycopyrrolate  as needed.  # Psychosocial Support:  - Son, daughter, daughter-in-law  # Discharge Planning: Home with Hospice today, patient resides in [facility]  - [facility] assisting with discharge coordination  Discussed with: Patient, patient's daughter-in-law, TOC, RN, hospitalist, [facility] liaison  Thank you for allowing the palliative care team to participate in the care Kim [name].  Tinnie Radar, DO Palliative Care Provider PMT # [phone]  If patient remains symptomatic despite maximum doses, please call PMT at [phone] between 0700 and 1900. Outside of these hours, please call attending, as PMT does not have night coverage.

## 2024-01-22 NOTE — Telephone Encounter (Unsigned)
 Copied from CRM #[id number]. Topic: General - Other >> Jan 19, 2024  3:57 PM Deaijah H wrote: Reason for CRM: [facility] w/ [facility] patient requesting that Dr. Juanita serve as hospice attending provider. does Dr. Geofm agree with serving? patient has a terminal illness with a life expectancy at 6mos or less if terminal illness runs it's natural course. Please call [phone] [facility]) secured line

## 2024-01-22 NOTE — Telephone Encounter (Signed)
 Yes ok

## 2024-01-22 NOTE — Telephone Encounter (Signed)
 Spoke with Kim [name] today and information given.

## 2024-01-23 ENCOUNTER — Other Ambulatory Visit (HOSPITAL_COMMUNITY): Payer: Self-pay

## 2024-01-23 ENCOUNTER — Encounter: Payer: Self-pay | Admitting: Internal Medicine

## 2024-01-23 MED ORDER — MORPHINE SULFATE (CONCENTRATE) 10 MG /0.5 ML PO SOLN
5.0000 mg | ORAL | 0 refills | Status: DC | PRN
Start: 2024-01-23 — End: 2024-02-16
  Filled 2024-01-23: qty 30, 20d supply, fill #0

## 2024-02-05 DEATH — deceased

## 2024-02-22 ENCOUNTER — Telehealth: Payer: Self-pay

## 2024-02-22 NOTE — Telephone Encounter (Signed)
 Chart opened to bill for home health cert form for [facility]
